# Patient Record
Sex: Male | Born: 1960 | Race: White | Hispanic: No | Marital: Single | Smoking: Never smoker
Health system: Southern US, Community
[De-identification: ages and names within clinical notes are randomized; demographics above are authoritative.]

## PROBLEM LIST (undated history)

## (undated) DIAGNOSIS — K469 Unspecified abdominal hernia without obstruction or gangrene: Secondary | ICD-10-CM

## (undated) DIAGNOSIS — N183 Chronic kidney disease, stage 3 unspecified: Secondary | ICD-10-CM

## (undated) DIAGNOSIS — R599 Enlarged lymph nodes, unspecified: Secondary | ICD-10-CM

## (undated) DIAGNOSIS — E291 Testicular hypofunction: Secondary | ICD-10-CM

## (undated) DIAGNOSIS — N200 Calculus of kidney: Secondary | ICD-10-CM

## (undated) DIAGNOSIS — D649 Anemia, unspecified: Secondary | ICD-10-CM

## (undated) DIAGNOSIS — I10 Essential (primary) hypertension: Secondary | ICD-10-CM

## (undated) DIAGNOSIS — B20 Human immunodeficiency virus [HIV] disease: Secondary | ICD-10-CM

## (undated) DIAGNOSIS — K8020 Calculus of gallbladder without cholecystitis without obstruction: Secondary | ICD-10-CM

## (undated) DIAGNOSIS — R5081 Fever presenting with conditions classified elsewhere: Principal | ICD-10-CM

## (undated) DIAGNOSIS — R011 Cardiac murmur, unspecified: Secondary | ICD-10-CM

## (undated) DIAGNOSIS — D61818 Other pancytopenia: Principal | ICD-10-CM

## (undated) HISTORY — DX: Calculus of gallbladder without cholecystitis without obstruction: K80.20

## (undated) HISTORY — DX: Fever presenting with conditions classified elsewhere: R50.81

## (undated) HISTORY — PX: AXILLARY LYMPH NODE DISSECTION: SHX5229

## (undated) HISTORY — DX: Other pancytopenia: D61.818

## (undated) HISTORY — DX: Enlarged lymph nodes, unspecified: R59.9

## (undated) HISTORY — DX: Human immunodeficiency virus (HIV) disease: B20

## (undated) HISTORY — DX: Testicular hypofunction: E29.1

---

## 1998-05-21 ENCOUNTER — Emergency Department (HOSPITAL_COMMUNITY): Admission: EM | Admit: 1998-05-21 | Discharge: 1998-05-21 | Payer: Self-pay

## 1998-05-23 ENCOUNTER — Ambulatory Visit (HOSPITAL_COMMUNITY): Admission: RE | Admit: 1998-05-23 | Discharge: 1998-05-23 | Payer: Self-pay | Admitting: Urology

## 1998-05-23 ENCOUNTER — Encounter: Payer: Self-pay | Admitting: Urology

## 1999-09-29 HISTORY — PX: APPENDECTOMY: SHX54

## 1999-10-25 ENCOUNTER — Emergency Department (HOSPITAL_COMMUNITY): Admission: EM | Admit: 1999-10-25 | Discharge: 1999-10-25 | Payer: Self-pay | Admitting: Emergency Medicine

## 1999-10-27 ENCOUNTER — Emergency Department (HOSPITAL_COMMUNITY): Admission: EM | Admit: 1999-10-27 | Discharge: 1999-10-27 | Payer: Self-pay | Admitting: Emergency Medicine

## 1999-10-27 ENCOUNTER — Encounter: Payer: Self-pay | Admitting: Emergency Medicine

## 1999-10-28 ENCOUNTER — Inpatient Hospital Stay (HOSPITAL_COMMUNITY): Admission: AD | Admit: 1999-10-28 | Discharge: 1999-11-08 | Payer: Self-pay | Admitting: *Deleted

## 1999-10-28 ENCOUNTER — Encounter (INDEPENDENT_AMBULATORY_CARE_PROVIDER_SITE_OTHER): Payer: Self-pay | Admitting: Specialist

## 2007-01-31 ENCOUNTER — Emergency Department (HOSPITAL_COMMUNITY): Admission: EM | Admit: 2007-01-31 | Discharge: 2007-02-01 | Payer: Self-pay | Admitting: Emergency Medicine

## 2007-03-08 ENCOUNTER — Emergency Department (HOSPITAL_COMMUNITY): Admission: EM | Admit: 2007-03-08 | Discharge: 2007-03-08 | Payer: Self-pay | Admitting: Emergency Medicine

## 2007-07-26 ENCOUNTER — Emergency Department (HOSPITAL_COMMUNITY): Admission: EM | Admit: 2007-07-26 | Discharge: 2007-07-26 | Payer: Self-pay | Admitting: Emergency Medicine

## 2007-07-28 ENCOUNTER — Emergency Department (HOSPITAL_COMMUNITY): Admission: EM | Admit: 2007-07-28 | Discharge: 2007-07-28 | Payer: Self-pay | Admitting: Family Medicine

## 2007-07-31 ENCOUNTER — Emergency Department (HOSPITAL_COMMUNITY): Admission: EM | Admit: 2007-07-31 | Discharge: 2007-07-31 | Payer: Self-pay | Admitting: Family Medicine

## 2007-08-11 ENCOUNTER — Emergency Department (HOSPITAL_COMMUNITY): Admission: EM | Admit: 2007-08-11 | Discharge: 2007-08-11 | Payer: Self-pay | Admitting: Emergency Medicine

## 2007-09-28 ENCOUNTER — Emergency Department (HOSPITAL_COMMUNITY): Admission: EM | Admit: 2007-09-28 | Discharge: 2007-09-28 | Payer: Self-pay | Admitting: Family Medicine

## 2007-12-02 ENCOUNTER — Emergency Department (HOSPITAL_COMMUNITY): Admission: EM | Admit: 2007-12-02 | Discharge: 2007-12-02 | Payer: Self-pay | Admitting: Family Medicine

## 2008-02-23 ENCOUNTER — Emergency Department (HOSPITAL_COMMUNITY): Admission: EM | Admit: 2008-02-23 | Discharge: 2008-02-23 | Payer: Self-pay | Admitting: Family Medicine

## 2008-06-21 ENCOUNTER — Emergency Department (HOSPITAL_COMMUNITY): Admission: EM | Admit: 2008-06-21 | Discharge: 2008-06-21 | Payer: Self-pay | Admitting: Emergency Medicine

## 2008-08-04 ENCOUNTER — Emergency Department (HOSPITAL_COMMUNITY): Admission: EM | Admit: 2008-08-04 | Discharge: 2008-08-04 | Payer: Self-pay | Admitting: Emergency Medicine

## 2008-09-28 DIAGNOSIS — B20 Human immunodeficiency virus [HIV] disease: Secondary | ICD-10-CM

## 2008-09-28 DIAGNOSIS — Z21 Asymptomatic human immunodeficiency virus [HIV] infection status: Secondary | ICD-10-CM

## 2008-09-28 HISTORY — DX: Human immunodeficiency virus (HIV) disease: B20

## 2008-09-28 HISTORY — DX: Asymptomatic human immunodeficiency virus (hiv) infection status: Z21

## 2008-09-28 HISTORY — PX: LITHOTRIPSY: SUR834

## 2008-12-03 ENCOUNTER — Inpatient Hospital Stay (HOSPITAL_COMMUNITY): Admission: EM | Admit: 2008-12-03 | Discharge: 2008-12-17 | Payer: Self-pay | Admitting: Emergency Medicine

## 2008-12-03 ENCOUNTER — Ambulatory Visit: Payer: Self-pay | Admitting: Internal Medicine

## 2008-12-03 ENCOUNTER — Encounter: Payer: Self-pay | Admitting: Internal Medicine

## 2008-12-05 ENCOUNTER — Encounter: Payer: Self-pay | Admitting: Internal Medicine

## 2008-12-05 LAB — CONVERTED CEMR LAB: Hep B C IgM: NEGATIVE

## 2008-12-06 ENCOUNTER — Ambulatory Visit: Payer: Self-pay | Admitting: Internal Medicine

## 2008-12-12 ENCOUNTER — Encounter (INDEPENDENT_AMBULATORY_CARE_PROVIDER_SITE_OTHER): Payer: Self-pay | Admitting: *Deleted

## 2008-12-24 ENCOUNTER — Encounter: Payer: Self-pay | Admitting: Internal Medicine

## 2009-01-01 ENCOUNTER — Ambulatory Visit: Payer: Self-pay | Admitting: Internal Medicine

## 2009-01-01 DIAGNOSIS — M109 Gout, unspecified: Secondary | ICD-10-CM

## 2009-01-01 DIAGNOSIS — N200 Calculus of kidney: Secondary | ICD-10-CM

## 2009-01-01 DIAGNOSIS — N12 Tubulo-interstitial nephritis, not specified as acute or chronic: Secondary | ICD-10-CM | POA: Insufficient documentation

## 2009-01-01 DIAGNOSIS — I10 Essential (primary) hypertension: Secondary | ICD-10-CM

## 2009-01-01 DIAGNOSIS — N2581 Secondary hyperparathyroidism of renal origin: Secondary | ICD-10-CM | POA: Insufficient documentation

## 2009-01-01 DIAGNOSIS — B20 Human immunodeficiency virus [HIV] disease: Secondary | ICD-10-CM

## 2009-01-01 LAB — CONVERTED CEMR LAB: HCV Quantitative: NEGATIVE intl units/mL

## 2009-01-02 ENCOUNTER — Encounter: Payer: Self-pay | Admitting: Internal Medicine

## 2009-01-02 LAB — CONVERTED CEMR LAB
AST: 17 units/L (ref 0–37)
Albumin: 4.5 g/dL (ref 3.5–5.2)
BUN: 17 mg/dL (ref 6–23)
CO2: 24 meq/L (ref 19–32)
Calcium: 9.7 mg/dL (ref 8.4–10.5)
Chloride: 103 meq/L (ref 96–112)
Creatinine, Ser: 1.5 mg/dL (ref 0.40–1.50)
HCT: 30 % — ABNORMAL LOW (ref 39.0–52.0)
Hemoglobin: 9.5 g/dL — ABNORMAL LOW (ref 13.0–17.0)
Hep B S Ab: POSITIVE — AB
Potassium: 4.4 meq/L (ref 3.5–5.3)
RBC: 3.19 M/uL — ABNORMAL LOW (ref 4.22–5.81)
RDW: 15.9 % — ABNORMAL HIGH (ref 11.5–15.5)
WBC: 3.2 10*3/uL — ABNORMAL LOW (ref 4.0–10.5)

## 2009-01-07 ENCOUNTER — Encounter: Payer: Self-pay | Admitting: Internal Medicine

## 2009-01-08 ENCOUNTER — Ambulatory Visit: Payer: Self-pay | Admitting: Internal Medicine

## 2009-01-14 ENCOUNTER — Encounter (INDEPENDENT_AMBULATORY_CARE_PROVIDER_SITE_OTHER): Payer: Self-pay | Admitting: *Deleted

## 2009-01-17 ENCOUNTER — Telehealth: Payer: Self-pay | Admitting: Internal Medicine

## 2009-01-18 ENCOUNTER — Inpatient Hospital Stay (HOSPITAL_COMMUNITY): Admission: EM | Admit: 2009-01-18 | Discharge: 2009-01-21 | Payer: Self-pay | Admitting: Family Medicine

## 2009-01-19 ENCOUNTER — Ambulatory Visit: Payer: Self-pay | Admitting: Infectious Diseases

## 2009-01-23 ENCOUNTER — Telehealth: Payer: Self-pay | Admitting: Internal Medicine

## 2009-01-29 ENCOUNTER — Telehealth (INDEPENDENT_AMBULATORY_CARE_PROVIDER_SITE_OTHER): Payer: Self-pay | Admitting: *Deleted

## 2009-02-04 ENCOUNTER — Encounter: Payer: Self-pay | Admitting: Internal Medicine

## 2009-02-05 ENCOUNTER — Ambulatory Visit: Payer: Self-pay | Admitting: Internal Medicine

## 2009-02-11 ENCOUNTER — Ambulatory Visit (HOSPITAL_COMMUNITY): Admission: RE | Admit: 2009-02-11 | Discharge: 2009-02-11 | Payer: Self-pay | Admitting: Orthopedic Surgery

## 2009-02-11 ENCOUNTER — Telehealth (INDEPENDENT_AMBULATORY_CARE_PROVIDER_SITE_OTHER): Payer: Self-pay | Admitting: *Deleted

## 2009-02-14 ENCOUNTER — Telehealth (INDEPENDENT_AMBULATORY_CARE_PROVIDER_SITE_OTHER): Payer: Self-pay | Admitting: *Deleted

## 2009-02-19 ENCOUNTER — Encounter: Payer: Self-pay | Admitting: Internal Medicine

## 2009-02-21 ENCOUNTER — Ambulatory Visit: Payer: Self-pay | Admitting: Internal Medicine

## 2009-02-21 LAB — CONVERTED CEMR LAB
ALT: 19 units/L (ref 0–53)
Alkaline Phosphatase: 68 units/L (ref 39–117)
CO2: 22 meq/L (ref 19–32)
Cholesterol: 248 mg/dL — ABNORMAL HIGH (ref 0–200)
Creatinine, Ser: 1.26 mg/dL (ref 0.40–1.50)
Eosinophils Relative: 2 % (ref 0–5)
GFR calc Af Amer: >60 mL/min (ref >60)
GFR calc non Af Amer: >60 mL/min (ref >60)
Glucose, Bld: 116 mg/dL — ABNORMAL HIGH (ref 70–99)
HCT: 37.5 % — ABNORMAL LOW (ref 39.0–52.0)
HDL: 38 mg/dL — ABNORMAL LOW (ref >39)
Hemoglobin: 12.5 g/dL — ABNORMAL LOW (ref 13.0–17.0)
LDL Cholesterol: 164 mg/dL — ABNORMAL HIGH (ref 0–99)
Lymphocytes Relative: 34 % (ref 12–46)
Lymphs Abs: 1.3 10*3/uL (ref 0.7–4.0)
Monocytes Absolute: 0.3 10*3/uL (ref 0.1–1.0)
Monocytes Relative: 8 % (ref 3–12)
Neutro Abs: 2.1 10*3/uL (ref 1.7–7.7)
RBC: 3.81 M/uL — ABNORMAL LOW (ref 4.22–5.81)
Sodium: 137 meq/L (ref 135–145)
Total Bilirubin: 0.4 mg/dL (ref 0.3–1.2)
Total CHOL/HDL Ratio: 6.5
Total Protein: 7.6 g/dL (ref 6.0–8.3)
Triglycerides: 228 mg/dL — ABNORMAL HIGH (ref <150)
VLDL: 46 mg/dL — ABNORMAL HIGH (ref 0–40)

## 2009-03-26 ENCOUNTER — Ambulatory Visit: Payer: Self-pay | Admitting: Internal Medicine

## 2009-04-10 ENCOUNTER — Encounter: Payer: Self-pay | Admitting: Internal Medicine

## 2009-05-20 ENCOUNTER — Encounter: Payer: Self-pay | Admitting: Internal Medicine

## 2009-06-11 IMAGING — CT CT ABDOMEN W/O CM
2 of 4 series · 17 of 46 positions shown, 19 images · non-contrast
Comparison: CT urogram 12/03/2008

CT ABDOMEN

CLINICAL DATA: Left flank pain, hematuria, fever

CT OF THE ABDOMEN AND PELVIS WITHOUT CONTRAST (CT UROGRAM)
TECHNIQUE: Multidetector CT imaging was performed through the
abdomen and pelvis to include the urinary tract.

[Series 2: start above diaphragm · axial · 0.88mm/px · z∈[-426,-11]mm · 14 of 94 slices shown, 16 images]
[im 7/94  soft-tissue]
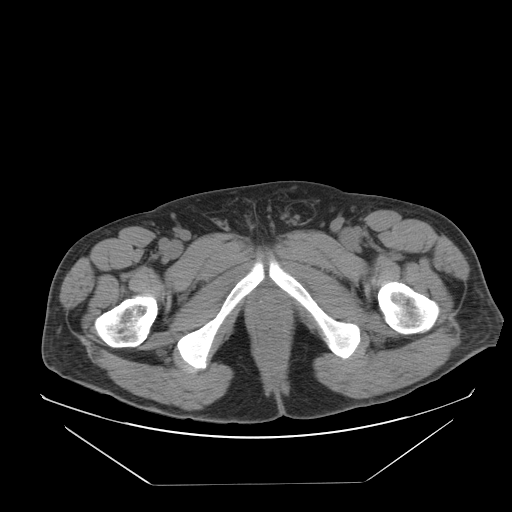
[im 7/94  bone]
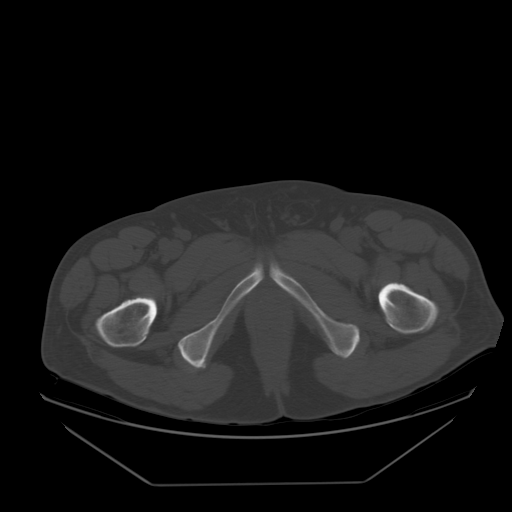
[im 13/94  soft-tissue]
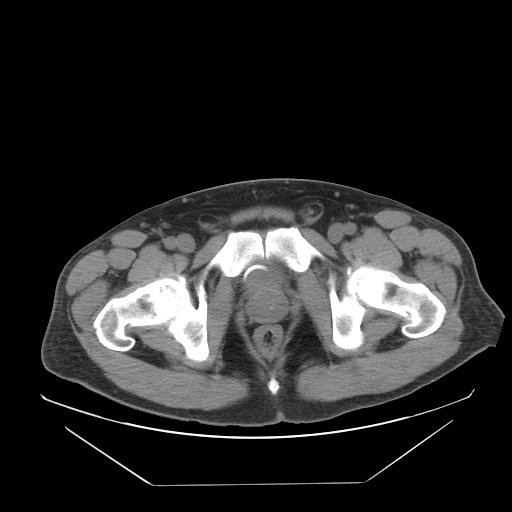
[im 20/94  soft-tissue]
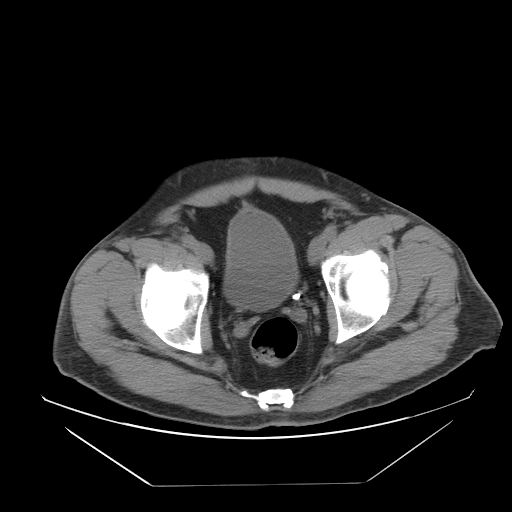
[im 26/94  soft-tissue]
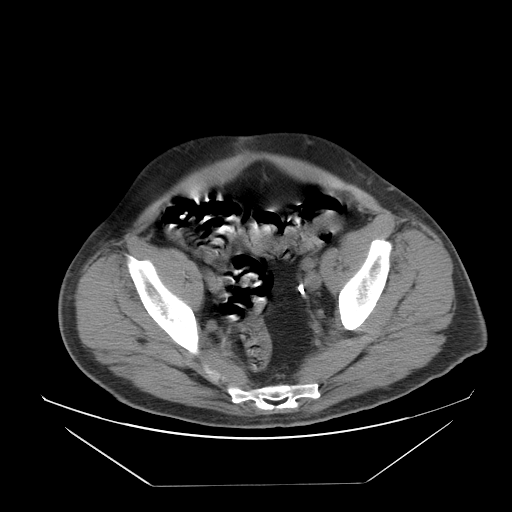
[im 33/94  soft-tissue]
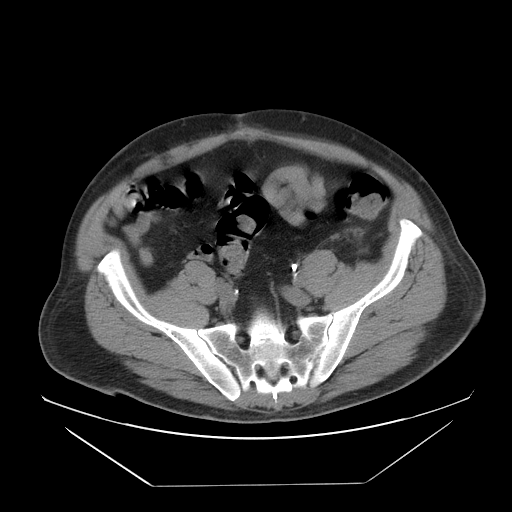
[im 39/94  soft-tissue]
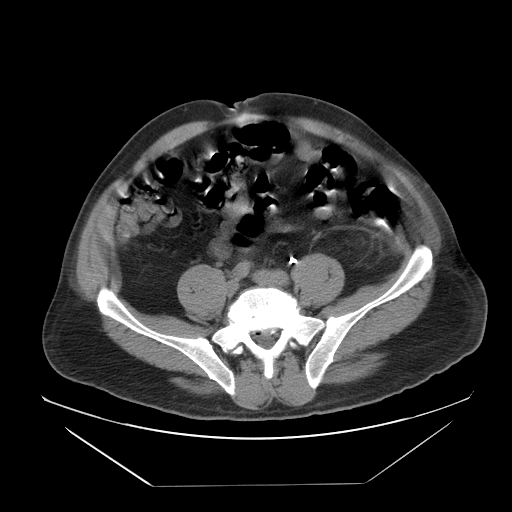
[im 45/94  soft-tissue]
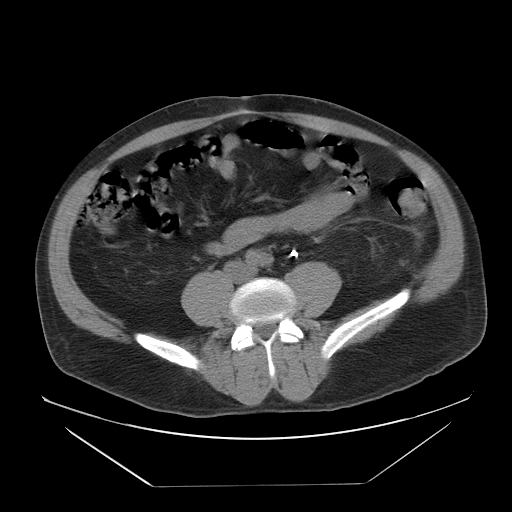
[im 52/94  soft-tissue]
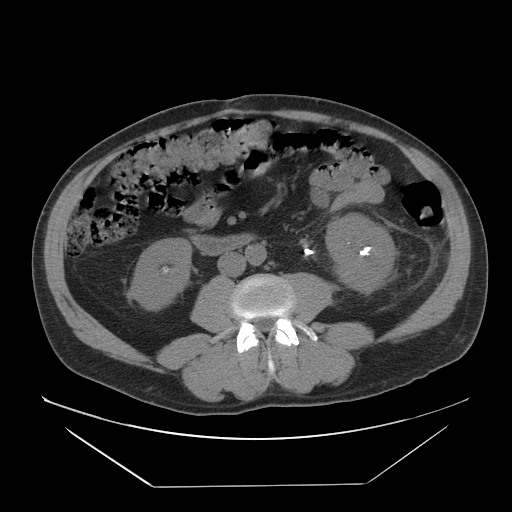
[im 58/94  soft-tissue]
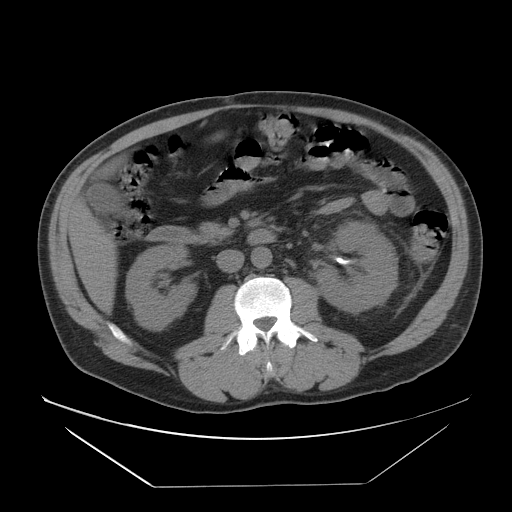
[im 58/94  bone]
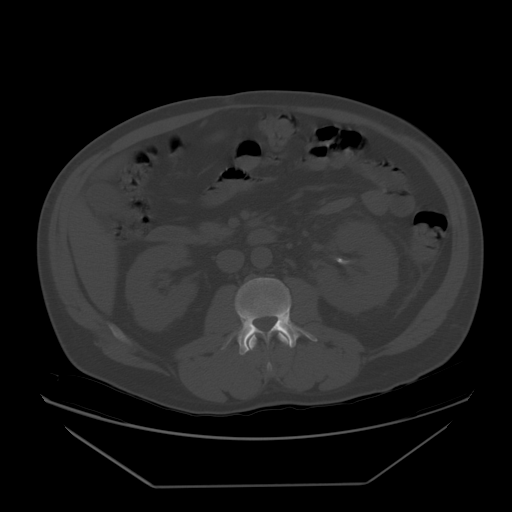
[im 65/94  soft-tissue]
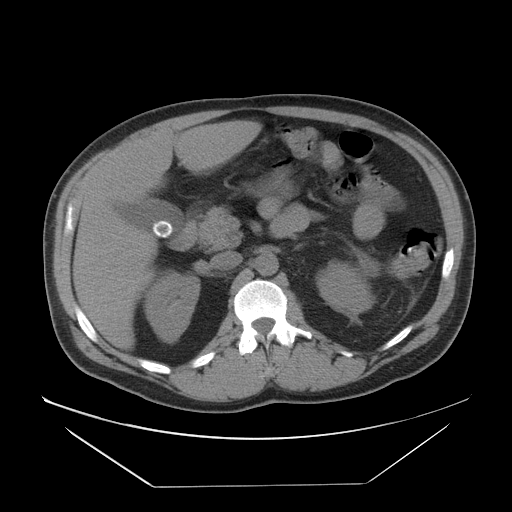
[im 71/94  soft-tissue]
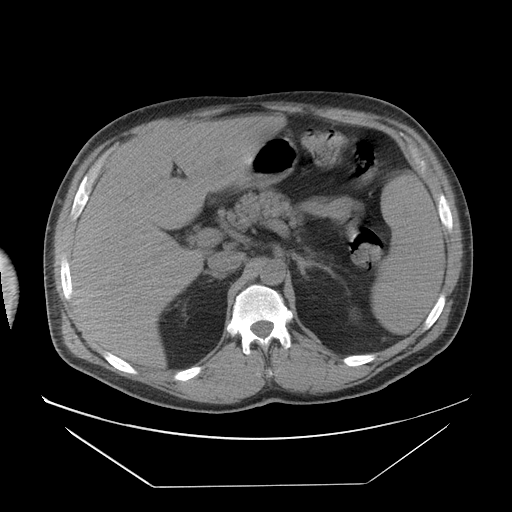
[im 77/94  soft-tissue]
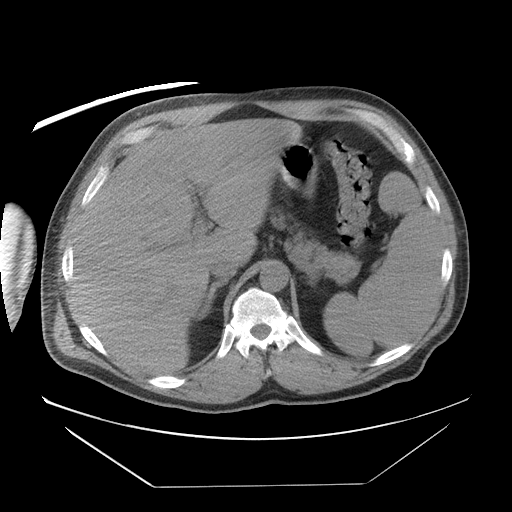
[im 84/94  soft-tissue]
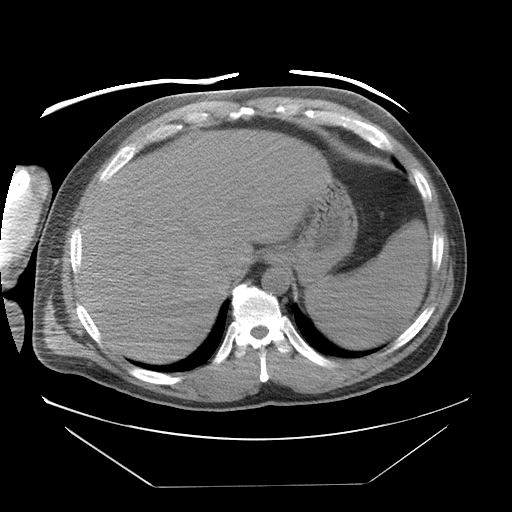
[im 90/94  soft-tissue]
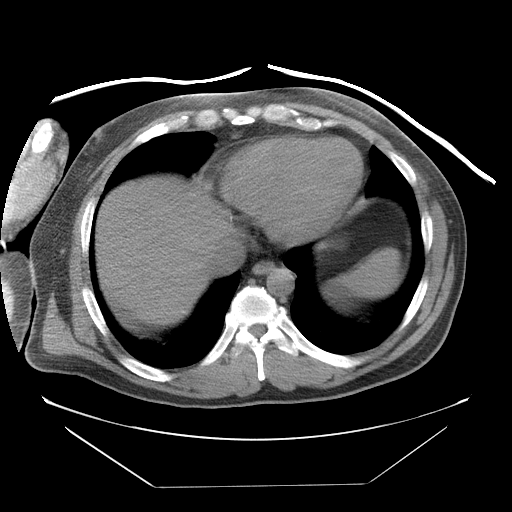

[Series 400: reformatted · coronal · 0.90mm/px · 3 of 83 slices shown]
[im 28/83  soft-tissue]
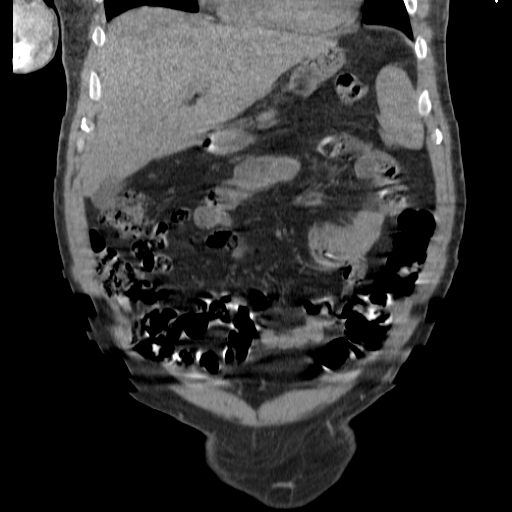
[im 37/83  soft-tissue]
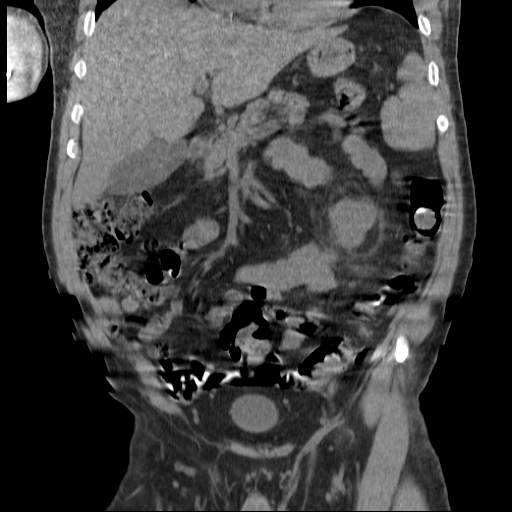
[im 46/83  soft-tissue]
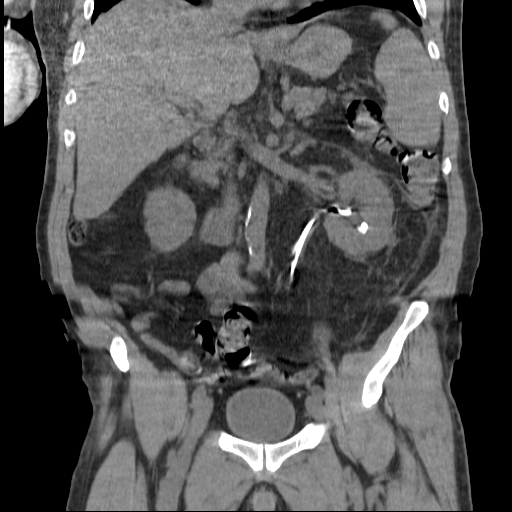

[17 of 46 positions shown; findings below may reference images not displayed]

FINDINGS: Lung bases clear.  Unenhanced appearance of the liver
and spleen unremarkable.  Cholelithiasis without signs of acute
cholecystitis.  Pancreas unremarkable.

Right nephrolithiasis without obstructive uropathy.  Left
nephrolithiasis with indwelling double-J catheter.  Mild left
greater than right perinephric stranding without focal fluid
collection.  Mild thickening Gerota's fascia.  Atherosclerotic
changes of the aorta, nonaneurysmal.  Moderate stool burden.  No
free intra-abdominal fluid or free air.

Compared with prior CT the large calculi in the left kidney have
been removed via percutaneous intervention.
IMPRESSION: As above.  Left double-J catheter in good position.  Mild left
greater than right perinephric stranding.  Nephrocalcinosis.
Cholelithiasis.

CT PELVIS
FINDINGS: Distal aspect double-J catheter satisfactory position in
the bladder.  No free fluid or free air.  No obstructive uropathy.
Moderate stool burden.  Mild stranding of the perinephric fat
inferiorly without drainable fluid collection.  No acute osseous
findings.  Advanced disc space narrowing L5-S1. Mild hip joint
changes.  Left inguinal hernia.
IMPRESSION: No significant obstructive uropathy or drainable fluid collection.
Double-J catheter in good position on the left.

## 2009-06-18 ENCOUNTER — Encounter (INDEPENDENT_AMBULATORY_CARE_PROVIDER_SITE_OTHER): Payer: Self-pay | Admitting: *Deleted

## 2009-06-25 ENCOUNTER — Ambulatory Visit: Payer: Self-pay | Admitting: Internal Medicine

## 2009-06-25 LAB — CONVERTED CEMR LAB
BUN: 17 mg/dL (ref 6–23)
CO2: 25 meq/L (ref 19–32)
Calcium: 9.4 mg/dL (ref 8.4–10.5)
Creatinine, Ser: 1.32 mg/dL (ref 0.40–1.50)
Glucose, Bld: 124 mg/dL — ABNORMAL HIGH (ref 70–99)
Sodium: 138 meq/L (ref 135–145)

## 2009-07-03 ENCOUNTER — Telehealth (INDEPENDENT_AMBULATORY_CARE_PROVIDER_SITE_OTHER): Payer: Self-pay | Admitting: *Deleted

## 2009-07-16 ENCOUNTER — Ambulatory Visit: Payer: Self-pay | Admitting: Internal Medicine

## 2009-08-01 ENCOUNTER — Telehealth (INDEPENDENT_AMBULATORY_CARE_PROVIDER_SITE_OTHER): Payer: Self-pay | Admitting: *Deleted

## 2009-08-27 ENCOUNTER — Telehealth (INDEPENDENT_AMBULATORY_CARE_PROVIDER_SITE_OTHER): Payer: Self-pay | Admitting: *Deleted

## 2009-10-02 ENCOUNTER — Telehealth (INDEPENDENT_AMBULATORY_CARE_PROVIDER_SITE_OTHER): Payer: Self-pay | Admitting: *Deleted

## 2009-10-11 ENCOUNTER — Encounter (INDEPENDENT_AMBULATORY_CARE_PROVIDER_SITE_OTHER): Payer: Self-pay | Admitting: *Deleted

## 2009-10-17 ENCOUNTER — Ambulatory Visit: Payer: Self-pay | Admitting: Internal Medicine

## 2009-10-18 ENCOUNTER — Encounter: Payer: Self-pay | Admitting: Internal Medicine

## 2009-10-18 LAB — CONVERTED CEMR LAB
AST: 15 units/L (ref 0–37)
BUN: 20 mg/dL (ref 6–23)
Basophils Relative: 0 % (ref 0–1)
CO2: 23 meq/L (ref 19–32)
Calcium: 9.1 mg/dL (ref 8.4–10.5)
Chloride: 103 meq/L (ref 96–112)
Cholesterol: 214 mg/dL — ABNORMAL HIGH (ref 0–200)
Creatinine, Ser: 1.4 mg/dL (ref 0.40–1.50)
Eosinophils Absolute: 0.1 10*3/uL (ref 0.0–0.7)
Eosinophils Relative: 3 % (ref 0–5)
HCT: 41.6 % (ref 39.0–52.0)
HDL: 27 mg/dL — ABNORMAL LOW (ref >39)
Lymphs Abs: 1 10*3/uL (ref 0.7–4.0)
MCHC: 31.5 g/dL (ref 30.0–36.0)
MCV: 99.3 fL (ref >=78.0)
Platelets: 176 10*3/uL (ref 150–400)
Total CHOL/HDL Ratio: 7.9
WBC: 4.5 10*3/uL (ref 4.0–10.5)

## 2009-10-29 ENCOUNTER — Ambulatory Visit: Payer: Self-pay | Admitting: Internal Medicine

## 2009-10-29 ENCOUNTER — Telehealth (INDEPENDENT_AMBULATORY_CARE_PROVIDER_SITE_OTHER): Payer: Self-pay | Admitting: *Deleted

## 2009-10-29 DIAGNOSIS — R7309 Other abnormal glucose: Secondary | ICD-10-CM

## 2009-10-30 ENCOUNTER — Encounter (INDEPENDENT_AMBULATORY_CARE_PROVIDER_SITE_OTHER): Payer: Self-pay | Admitting: *Deleted

## 2009-11-28 ENCOUNTER — Ambulatory Visit (HOSPITAL_COMMUNITY): Admission: RE | Admit: 2009-11-28 | Discharge: 2009-11-28 | Payer: Self-pay | Admitting: Infectious Disease

## 2009-11-28 ENCOUNTER — Ambulatory Visit: Payer: Self-pay | Admitting: Infectious Disease

## 2009-11-28 LAB — CONVERTED CEMR LAB
ALT: 21 units/L (ref 0–53)
BUN: 15 mg/dL (ref 6–23)
CO2: 24 meq/L (ref 19–32)
Calcium: 9.3 mg/dL (ref 8.4–10.5)
Chloride: 102 meq/L (ref 96–112)
Creatinine, Ser: 1.36 mg/dL (ref 0.40–1.50)
Eosinophils Absolute: 0.2 10*3/uL (ref 0.0–0.7)
Eosinophils Relative: 2 % (ref 0–5)
HCT: 38.7 % — ABNORMAL LOW (ref 39.0–52.0)
HIV 1 RNA Quant: 72 copies/mL — ABNORMAL HIGH (ref <48)
HIV-1 RNA Quant, Log: 1.86 — ABNORMAL HIGH (ref <1.68)
Lymphs Abs: 1.3 10*3/uL (ref 0.7–4.0)
MCV: 97.7 fL (ref >=78.0)
Monocytes Relative: 7 % (ref 3–12)
RBC: 3.96 M/uL — ABNORMAL LOW (ref 4.22–5.81)
WBC: 6.2 10*3/uL (ref 4.0–10.5)

## 2009-12-04 ENCOUNTER — Telehealth: Payer: Self-pay | Admitting: Internal Medicine

## 2009-12-10 ENCOUNTER — Telehealth: Payer: Self-pay | Admitting: Infectious Disease

## 2009-12-19 ENCOUNTER — Telehealth: Payer: Self-pay | Admitting: Internal Medicine

## 2010-01-16 ENCOUNTER — Encounter (INDEPENDENT_AMBULATORY_CARE_PROVIDER_SITE_OTHER): Payer: Self-pay | Admitting: *Deleted

## 2010-01-28 ENCOUNTER — Ambulatory Visit: Payer: Self-pay | Admitting: Internal Medicine

## 2010-01-28 LAB — CONVERTED CEMR LAB
Chloride: 105 meq/L (ref 96–112)
HIV 1 RNA Quant: <48 copies/mL (ref <48)
HIV-1 RNA Quant, Log: <1.68 (ref <1.68)
Potassium: 4.6 meq/L (ref 3.5–5.3)
Sodium: 137 meq/L (ref 135–145)

## 2010-04-08 ENCOUNTER — Ambulatory Visit: Payer: Self-pay | Admitting: Internal Medicine

## 2010-04-23 ENCOUNTER — Ambulatory Visit: Payer: Self-pay | Admitting: Internal Medicine

## 2010-05-15 ENCOUNTER — Telehealth (INDEPENDENT_AMBULATORY_CARE_PROVIDER_SITE_OTHER): Payer: Self-pay | Admitting: *Deleted

## 2010-05-27 ENCOUNTER — Inpatient Hospital Stay (HOSPITAL_COMMUNITY)
Admission: EM | Admit: 2010-05-27 | Discharge: 2010-05-31 | Payer: Self-pay | Source: Home / Self Care | Admitting: Emergency Medicine

## 2010-05-27 ENCOUNTER — Emergency Department (HOSPITAL_COMMUNITY): Admission: EM | Admit: 2010-05-27 | Discharge: 2010-05-27 | Payer: Self-pay | Admitting: Family Medicine

## 2010-05-28 ENCOUNTER — Ambulatory Visit: Payer: Self-pay | Admitting: Internal Medicine

## 2010-05-28 ENCOUNTER — Encounter (INDEPENDENT_AMBULATORY_CARE_PROVIDER_SITE_OTHER): Payer: Self-pay | Admitting: Internal Medicine

## 2010-05-31 ENCOUNTER — Encounter: Payer: Self-pay | Admitting: Internal Medicine

## 2010-06-12 ENCOUNTER — Ambulatory Visit: Payer: Self-pay | Admitting: Internal Medicine

## 2010-06-12 DIAGNOSIS — L03119 Cellulitis of unspecified part of limb: Secondary | ICD-10-CM

## 2010-06-12 DIAGNOSIS — L02419 Cutaneous abscess of limb, unspecified: Secondary | ICD-10-CM

## 2010-06-12 LAB — CONVERTED CEMR LAB
CO2: 24 meq/L (ref 19–32)
Chloride: 105 meq/L (ref 96–112)
HIV-1 RNA Quant, Log: <1.3 (ref <1.30)
Potassium: 4.8 meq/L (ref 3.5–5.3)
Sodium: 139 meq/L (ref 135–145)

## 2010-06-16 ENCOUNTER — Encounter (INDEPENDENT_AMBULATORY_CARE_PROVIDER_SITE_OTHER): Payer: Self-pay | Admitting: *Deleted

## 2010-07-15 ENCOUNTER — Ambulatory Visit: Payer: Self-pay | Admitting: Adult Health

## 2010-08-25 ENCOUNTER — Encounter (INDEPENDENT_AMBULATORY_CARE_PROVIDER_SITE_OTHER): Payer: Self-pay | Admitting: *Deleted

## 2010-10-19 ENCOUNTER — Encounter: Payer: Self-pay | Admitting: Internal Medicine

## 2010-10-28 NOTE — Miscellaneous (Signed)
   Clinical Lists Changes  Orders: Added new Service order of New Patient Level II [phone]) - Signed

## 2010-10-28 NOTE — Assessment & Plan Note (Signed)
Summary: recheck B/P, 143/89    Vital Signs:  Patient profile:   50 year old male Pulse rate:   75 / minute BP sitting:   143 / 89  (left arm) Cuff size:   large  Vitals Entered By: Jennet Maduro RN (April 23, 2010 9:04 AM) CC: B/P check.  Pt. given card with values written on it.  Also included CD4 and VL values.  Prior Medications: ATRIPLA 600-200-300 MG TABS (EFAVIRENZ-EMTRICITAB-TENOFOVIR) Take 1 tablet by mouth once a day LABETALOL HCL 200 MG TABS (LABETALOL HCL) Take 1 tablet by mouth two times a day DAPSONE 25 MG TABS (DAPSONE) Take 4 tablets by mouth once a day Current Allergies: ! CODEINE ! SULFA ! * LATEX

## 2010-10-28 NOTE — Miscellaneous (Signed)
Summary: clinical update/ryan white  Clinical Lists Changes  Observations: Added new observation of PCTFPL: 157.97  (06/16/2010 12:39) Added new observation of HOUSEINCOME: 16109  (06/16/2010 12:39) Added new observation of YEARLYEXPEN: 16000  (06/16/2010 12:39) Added new observation of FINASSESSDT: 06/13/2010  (06/16/2010 12:39)

## 2010-10-28 NOTE — Progress Notes (Signed)
Summary: [facility]/pt assist meds arrived for Feb  Phone Note Refill Request      Prescriptions: ATRIPLA 600-200-300 MG TABS (EFAVIRENZ-EMTRICITAB-TENOFOVIR) Take 1 tablet by mouth once a day  #30 x 0   Entered by:   Paulo Fruit  BS,CPht II,MPH   Authorized by:   Cliffton Asters MD   Signed by:   Paulo Fruit  BS,CPht II,MPH on 10/29/2009   Method used:   Samples Given   RxID:   [id number] DAPSONE 100 MG TABS (DAPSONE) Take 1 tablet by mouth once a day  #30 x 0   Entered by:   Paulo Fruit  BS,CPht II,MPH   Authorized by:   Cliffton Asters MD   Signed by:   Paulo Fruit  BS,CPht II,MPH on 10/29/2009   Method used:   Samples Given   RxID:   [id number]   Patient Assist Medication Verification: Medication: Atripla Lot# [id number] Exp Date:07 2013 Tech approval:MLD                Patient Assist Medication Verification: Medication:Dapsone 100mg  Lot#[id number] Exp Date:10 2015 Tech approval:MLD  I noticed during today's visit that Dr. Orvan Falconer d/cd the diflucan and zithromax.  Pharmacy notified not to send future refills on those medicatons. Call placed to patient with message that assistance medications are ready for pick-up. Patient said he will be in to pick up on Wednesday. Paulo Fruit  BS,CPht II,MPH  October 29, 2009 3:27 PM

## 2010-10-28 NOTE — Assessment & Plan Note (Signed)
Summary: hfu per Dr. Coralie Common 1wk  call pt. left msg. [mkj]   Primary Provider:  Cliffton Asters MD  CC:  HSFU, left lower leg cellulitis, finished oral clindamycin, and discharged 06/07/10.  History of Present Illness: Patrick [name] is in for a hospital follow-up visit.  He was admitted recently with left lower leg cellulitis that occurred one day after working in the garden.  He was down on his knees quite a bit that day and recalls scrubbing in his legs with a stiff brush while he was in the shower.  The next morning he woke with discomfort in his left leg, redness and swelling.  He got progressively worse over the next 24 hours and was admitted.  He improved fairly rapidly with empiric therapy he was discharged home on clindamycin which he completed 5 days ago.  He is feeling much better.  He has not missed any doses of his other medications.  Preventive Screening-Counseling & Management  Alcohol-Tobacco     Alcohol drinks/day: 1     Alcohol type: all     Smoking Status: never  Caffeine-Diet-Exercise     Caffeine use/day: yes     Does Patient Exercise: yes     Type of exercise: bicycling     Exercise (avg: min/session): 30-60     Times/week: 3  Hep-HIV-STD-Contraception     HIV Risk: no risk noted     HIV Risk Counseling: not indicated-no HIV risk noted  Safety-Violence-Falls     Seat Belt Use: yes      Sexual History:  n/a.        Drug Use:  never.     Prior Medication List:  ATRIPLA 600-200-300 MG TABS (EFAVIRENZ-EMTRICITAB-TENOFOVIR) Take 1 tablet by mouth once a day LABETALOL HCL 200 MG TABS (LABETALOL HCL) Take 1 tablet by mouth two times a day DAPSONE 100 MG TABS (DAPSONE) Take 1 tablet by mouth once a day   Current Allergies (reviewed today): ! CODEINE ! SULFA ! * LATEX Social History: Sexual History:  n/a Drug Use:  never  Vital Signs:  Patient profile:   50 year old male Height:      72 inches (182.88 cm) Weight:      238.0 pounds (108.18 kg) BMI:      32.40 Temp:     99.1 degrees F (37.28 degrees C) oral Pulse rate:   91 / minute BP sitting:   149 / 84  (left arm) Cuff size:   large  Vitals Entered By: Jennet Maduro RN (June 12, 2010 3:51 PM) CC: HSFU, left lower leg cellulitis, finished oral clindamycin, discharged 06/07/10 Is Patient Diabetic? No Pain Assessment Patient in pain? no      Nutritional Status BMI of 25 - 29 = overweight Nutritional Status Detail appetite "improving"  Have you ever been in a relationship where you felt threatened, hurt or afraid?No   Does patient need assistance? Functional Status Self care Ambulation Normal Comments no missed doses of hiv rxes   Physical Exam  General:  alert and overweight-appearing.   Mouth:  slgiht posterior erytehma no exudate Lungs:  normal respiratory effort, no crackles, and no wheezes.   Heart:  normal rate, regular rhythm, no murmur, no gallop, and no rub.   Extremities:  he has mild nonpitting edema of both lower legs left greater than right.  There is faint residual erythema on his left lower leg without associated warmth or pain.  The left leg has some mild dryness and superficial cracking.  He has no evidence of tinea pedis.        Medication Adherence: 06/12/2010   Adherence to medications reviewed with patient. Counseling to provide adequate adherence provided                                Impression & Recommendations:  Problem # 1:  HIV DISEASE (ICD-042) I will continue his current regimen and repeat his lab work today. Diagnostics Reviewed:  HIV: CDC-defined AIDS (01/01/2009)   CD4: 140 (01/29/2010)   WBC: 6.2 (11/28/2009)   Hgb: 12.5 (11/28/2009)   HCT: 38.7 (11/28/2009)   Platelets: 206 (11/28/2009) HIV-1 RNA: <48 copies/mL (01/28/2010)   HBSAg: NEG (01/01/2009)  Problem # 2:  CELLULITIS AND ABSCESS OF LEG EXCEPT FOOT (ICD-682.6) Hhis left leg cellulitis is resolving nicely.  I will observe off of antibiotics. Orders: Est. Patient  Level IV (66063)  Problem # 3:  HYPERTENSION (ICD-401.9) Alexandre's blood pressure is under fair control but he may need a second agent soon.  I will check a basic metabolic panel today. His updated medication list for this problem includes:    Labetalol Hcl 200 Mg Tabs (Labetalol hcl) .Marland Kitchen... Take 1 tablet by mouth two times a day  Orders: Est. Patient Level IV (99214)  BP today: 149/84 Prior BP: 143/89 (04/23/2010)  Labs Reviewed: K+: 4.6 (01/28/2010) Creat: : 1.41 (01/28/2010)   Chol: 214 (10/18/2009)   HDL: 27 (10/18/2009)   LDL: * mg/dL (01/60/1093)   TG: 235 (10/18/2009)  Other Orders: T-CD4SP ([facility]) (CD4SP) T-HIV Viral Load (57322-02542) T-Basic Metabolic Panel (70623-76283)  Patient Instructions: 1)  Please schedule a follow-up appointment in 3 months.

## 2010-10-28 NOTE — Miscellaneous (Signed)
Summary: clinical update/ryan white [facility] apprv til 12/27/10  Clinical Lists Changes  Observations: Added new observation of AIDSDAP: Yes 2011 (01/16/2010 14:41) 

## 2010-10-28 NOTE — Progress Notes (Signed)
Summary: [facility]/pt assist meds arrived for Jan  Phone Note Refill Request      Prescriptions: AZITHROMYCIN 600 MG TABS (AZITHROMYCIN) Take 2 tablets by mouth once a weej  #8 x 0   Entered by:   Paulo Fruit  BS,CPht II,MPH   Authorized by:   Cliffton Asters MD   Signed by:   Paulo Fruit  BS,CPht II,MPH on 10/02/2009   Method used:   Samples Given   RxID:   [id number] DAPSONE 100 MG TABS (DAPSONE) Take 1 tablet by mouth once a day  #30 x 0   Entered by:   Paulo Fruit  BS,CPht II,MPH   Authorized by:   Cliffton Asters MD   Signed by:   Paulo Fruit  BS,CPht II,MPH on 10/02/2009   Method used:   Samples Given   RxID:   [id number] FLUCONAZOLE 100 MG TABS (FLUCONAZOLE) Take 1 tablet by mouth once weekly  #4 x 0   Entered by:   Paulo Fruit  BS,CPht II,MPH   Authorized by:   Cliffton Asters MD   Signed by:   Paulo Fruit  BS,CPht II,MPH on 10/02/2009   Method used:   Samples Given   RxID:   [id number] ATRIPLA 600-200-300 MG TABS (EFAVIRENZ-EMTRICITAB-TENOFOVIR) Take 1 tablet by mouth once a day  #30 x 0   Entered by:   Paulo Fruit  BS,CPht II,MPH   Authorized by:   Cliffton Asters MD   Signed by:   Paulo Fruit  BS,CPht II,MPH on 10/02/2009   Method used:   Samples Given   RxID:   [id number]  Patient Assist Medication Verification: Medication name: Fluconazole 100mg  RX # [id number] Tech approval:MLD  Patient Assist Medication Verification: Medication name: Azithromycin 600mg  RX # [id number] Tech approval:MLD   Patient Assist Medication Verification: Medication: Tyrone Nine IRJ#[id number] Exp Date:06 2013 Tech approval:MLD                Patient Assist Medication Verification: Medication:Dapsone 100mg  Lot#[id number] Exp Date:10 2015 Tech approval:MLD Call placed to patient with message that assistance medications are ready for pick-up. Left message Paulo Fruit  BS,CPht II,MPH  October 02, 2009 10:42 AM                   Appended Document: [facility]/pt assist  meds arrived for Jan Prescription/Samples picked up by: patient

## 2010-10-28 NOTE — Discharge Summary (Signed)
Summary: Hospital Discharge Update    Hospital Discharge Update:  Date of Admission: 05/28/2010 Date of Discharge: 05/31/2010  Brief Summary:  Cellulitis of left lower extremity, improved on IV antibiotics  Other follow-up issues:  Follow-up resolution of left lower extremity cellulitis.  Medication list changes:  Added new medication of CLEOCIN 300 MG CAPS (CLINDAMYCIN HCL) Take one capsule three times daily - Signed Rx of CLEOCIN 300 MG CAPS (CLINDAMYCIN HCL) Take one capsule three times daily;  #21 x 0;  Signed;  Entered by: Bethel Born MD;  Authorized by: Bethel Born MD;  Method used: Electronically to [id number]*, [address] [place], [place], [place]  [address], Ph: [phone], Fax: [phone]  The medication, problem, and allergy lists have been updated.  Please see the dictated discharge summary for details.  Discharge medications:  ATRIPLA 600-200-300 MG TABS (EFAVIRENZ-EMTRICITAB-TENOFOVIR) Take 1 tablet by mouth once a day LABETALOL HCL 200 MG TABS (LABETALOL HCL) Take 1 tablet by mouth two times a day DAPSONE 100 MG TABS (DAPSONE) Take 1 tablet by mouth once a day CLEOCIN 300 MG CAPS (CLINDAMYCIN HCL) Take one capsule three times daily  Other patient instructions:  The clinic will call you with an appointment in 1 week. Please call [phone] in case you don't hear from [place]. Return to ED if your infection seems to be getting worse (fever, worsening pain, increasing redness).  Note: Hospital Discharge Medications & Other Instructions handout was printed, one copy for patient and a second copy to be placed in hospital chart.  Prescriptions: CLEOCIN 300 MG CAPS (CLINDAMYCIN HCL) Take one capsule three times daily  #21 x 0   Entered and Authorized by:   Bethel Born MD   Signed by:   Bethel Born MD on 05/31/2010   Method used:   Electronically to        [facility]. [phone]* (retail)       [address]       [place], [place]  [address]       Ph: [phone]       Fax: [phone]   RxID:   [id number]

## 2010-10-28 NOTE — Miscellaneous (Signed)
Summary: hosptial admission: cellulitis  INTERNAL MEDICINE ADMISSION HISTORY AND PHYSICAL Admit date 05/28/10  Attending: Dr. Meredith Pel Contacts: Dr. Allena Katz [phone]       Dr. Scot Dock [phone]         PCP: Dr. Orvan Falconer  CC: left leg rash  HPI:  Patrick [name] with pmh of HIV presents with 2 days of painful redness and swelling of the left lower leg.  He reports that sympoms started Monday afternoon (day prior to presentation).  He felt an achyness in his leg while he was at work, and by the time he was able to examine the leg later that afternoon there was redness extending over the entire calf.  The area was warm, swollen and painful.  By the time he got up on Tuesday morning he felt "like he had been beaten with a baseball bat" over the left leg, and could not put weight on that leg.  He describes pain being 10/10, deep in the calf and also in the knee.  He also endorses "flu-like" feelings since Monday with fatigue, chills and subjective fever and nausea.  He does not remember any trauma to the leg.  The day before symptoms started he worked in his garden for many hours and was kneeling in grass and mulch.  He does not recall any insect bites but notes that his dog has recently had a tick.   He presented to urgent care Tuesday morning and was refered to the emergency room.  In the ED he was placed on the cellulitis protocol, recieved 3 doses of IV clindamycin and observed overnight.  He now reports that pain and redness have improved, but the rash has extended 4 cm beyond the margins drawn last night.   ALLERGIES:  ! CODEINE--> hallucinations ! SULFA --> rash urticaria ! * LATEX  --> he does not recall being allergic to latex  PAST MEDICAL HISTORY: HIV disease - dx 11/2008 - CD4 140 01/2010 hx MSSA bacteremia in 11/2008 and 12/2008 hx MRSA skin infection Hypertension Pyelonephritis recurrent  Nephrolithiasis - sp ureteral stent 11/2008 which was removed due to recurrent infections hx renal failure  11/2008 secondary to ureteral obstruction and NSAID use.  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 no chest pain, shortness of breath, vomiting or diarrhea, other skin lesions, dysuria, numbness, weakness, or neurologic symptoms  VITALS: T: 101.7--> 99.4 P: 101--> 84  BP: 148/83  R:16  O2SAT: 94%  ON: RA  PHYSICAL EXAM: GEN: overweight male, comfortable, no distress HEENT: PERRL, EOMI, conjunctiva clear, MMM, no LAD, no JVD CV: RRR no mrg LUNGS: CTAB with good air movment ABD: BS +, soft, nt, nd, no mass, no gaurding, large midline scar, small left lower quadrant scar SKIN: erythema and tenderness over the left anterior calf from the ankle over the knee extends medially and laterally but is not circumferential, no skin breaks identified, reddest area is in over the lower shin, entire area is warm, calf  is swollen and visibly larger than the right, erythema extends 2-5 cm beyond margins drawn yesterday.  There are no skin breaks on the foot or between the toes. MSK: strength 5/5, there is no pain with motion of the left knee, there may be a small effusion- or this might be soft tissue inflamation overlying NEURO: non focal   LABS:   WBC                                    12.6       h      4.0-10.5         K/uL  RBC                                      3.96       l      4.22-5.81        MIL/uL  Hemoglobin (HGB)                          12.4       l      13.0-17.0        g/dL  Hematocrit (HCT)                         37.0       l      39.0-52.0        %  MCV                                      93.4              78.0-100.0       fL  MCH -                                    31.3              26.0-34.0        pg  MCHC                                     33.5              30.0-36.0        g/dL  RDW                                      13.5              11.5-15.5        %  Platelet Count (PLT)                     146        l      150-400          K/uL  Neutrophils, %  75                43-77            %  Lymphocytes, %                           18                12-46            %  Monocytes, %                             8                 3-12             %  Eosinophils, %                           0                 0-5              %  Basophils, %                             0                 0-1              %  Neutrophils, Absolute                    9.4        h      1.7-7.7          K/uL  Lymphocytes, Absolute                    2.2               0.7-4.0          K/uL  Monocytes, Absolute                      0.9               0.1-1.0          K/uL  Eosinophils, Absolute                    0.0               0.0-0.7          K/uL  Basophils, Absolute                      0.0               0.0-0.1          K/uL  Sodium (NA)                              137               135-145          mEq/L  Potassium (K)  4.3               3.5-5.1          mEq/L  Chloride                                 108               96-112           mEq/L  CO2                                      25                19-32            mEq/L  Glucose                                  203        h      70-99            mg/dL  BUN                                      9                 6-23             mg/dL  Creatinine                               1.48              0.4-1.5          mg/dL  GFR,  Est Non African American            51         l      >60              mL/min  GFR, Est African American                >60               >60              mL/min    Oversized comment, see footnote  1  Calcium                                  8.8               8.4-10.5         mg/dL   ED MEDS:  Clindamycin 300mg  q8hrs x 3 Tylenol home meds   ASSESSMENT AND PLAN: (1) left lower extremity cellulitis:  Has not responded to clindamycin.  Will draw blood cultures and start vacomycin and zosyn.  He has a hx of MRSA so will start contact precautions.  (2) HIV: last CD4 was several months ago.  Will recheck to evaluate immune status.  Continue home regimen.  (3) HTN: continue labetalol  (4) hyperglycemia: check A1C and FLP  (5) CRI: renal function slightly  below former baseline.  will follow. he sees Dr. Hyman Hopes in September.  (6) DVT prophy: heparin

## 2010-10-28 NOTE — Progress Notes (Signed)
Summary: drug on back order/ had to change drug to Dapsone 25mg   Phone Note From Pharmacy   Caller: [facility] Summary of Call: Dapsone 100mg  is temporarily on back order. Request to change to Dapsone 25mg  take 4 tablets by mouth once daily. Initial call taken by: Paulo Fruit  BS,CPht II,MPH,  December 19, 2009 3:33 PM    New/Updated Medications: DAPSONE 25 MG TABS (DAPSONE) Take 4 tablets by mouth once a day Prescriptions: DAPSONE 25 MG TABS (DAPSONE) Take 4 tablets by mouth once a day  #120 x 5   Entered by:   Paulo Fruit  BS,CPht II,MPH   Authorized by:   Cliffton Asters MD   Signed by:   Paulo Fruit  BS,CPht II,MPH on 12/19/2009   Method used:   Telephoned to ...       [facility] ([facility]       [address]       [place], [place]  [address]       Ph: [phone]       Fax: [phone]   RxID:   [id number]  Paulo Fruit  BS,CPht II,MPH  December 19, 2009 3:36 PM

## 2010-10-28 NOTE — Progress Notes (Signed)
Summary: [facility] cannot reach pt.  Phone Note Outgoing Call   Call placed by: Paulo Fruit  BS,CPht II,MPH,  May 15, 2010 3:59 PM Call placed to: Patient Details for Reason: pharmacy cannot reach to set up refill delivery Summary of Call: Left message for patient to contact our office. Initial call taken by: Paulo Fruit  BS,CPht II,MPH,  May 15, 2010 4:01 PM Caller: [facility] support [facility] Details for Reason: cannot reach pt to set up refill Summary of Call: Received a phone call from [facility] in reference to not being able to reach pt to set up his refill for the month.  They were given the phone number we have on file for patient.  I will try and contact patient for him to call the [phone] phone number to set up his refill and delivery.  They will not send medication until he is reached. Initial call taken by: Paulo Fruit  BS,CPht II,MPH,  May 15, 2010 3:59 PM

## 2010-10-28 NOTE — Progress Notes (Signed)
Summary: Sinus pressure/congestion cont., requesting more abx  Phone Note Call from Patient Call back at [facility] Phone [phone]   Caller: Patient Call For: Cliffton Asters MD Reason for Call: Acute Illness Summary of Call: Sinus congestion/pressure continues.  Requesting another rx for antibiotics.  No appt. available until next week.  Please advise.  Jennet Maduro RN  December 10, 2009 5:00 PM     Follow-up for Phone Call        He can take avelox for one month. If he is still having problems he will need to be seen by ENT. Follow-up by: Acey Lav MD,  December 11, 2009 5:27 PM  Additional Follow-up for Phone Call Additional follow up Details #1::        Message left for pt. with Dr. Lattie Haw rx and a request for the pt. to call when he finishes the rx to let us know how he is feeling. Jennet Maduro RN  December 12, 2009 10:15 AM     Additional Follow-up for Phone Call Additional follow up Details #2::    [facility]  Follow-up by: Acey Lav MD,  December 12, 2009 12:40 PM  New/Updated Medications: AVELOX 400 MG TABS (MOXIFLOXACIN HCL) Take 1 tablet by mouth once a day for 30 days Prescriptions: AVELOX 400 MG TABS (MOXIFLOXACIN HCL) Take 1 tablet by mouth once a day for 30 days  #30 x 0   Entered by:   Jennet Maduro RN   Authorized by:   Acey Lav MD   Signed by:   Jennet Maduro RN on 12/12/2009   Method used:   Electronically to        [facility]. [phone]* (retail)       [address]       [place], [place]  [address]       Ph: [phone]       Fax: [phone]   RxID:   [id number]   Appended Document: Sinus pressure/congestion cont., requesting more abx Pt. went to pick up the Avelox rx.  Would cost him approx. $560.00.  Pt. is requesting a less expensive rx.  Please advise.   Appended Document: Orders Update orders   Clinical Lists Changes  Medications: Removed medication of AVELOX 400 MG TABS  (MOXIFLOXACIN HCL) Take 1 tablet by mouth once a day for 30 days Added new medication of CLINDAMYCIN HCL 300 MG CAPS (CLINDAMYCIN HCL) Take 1 capsule by mouth three times a day for 10 days - Signed Rx of CLINDAMYCIN HCL 300 MG CAPS (CLINDAMYCIN HCL) Take 1 capsule by mouth three times a day for 10 days;  #30 x 1;  Signed;  Entered by: Acey Lav MD;  Authorized by: Paulette Blanch Dam MD;  Method used: Electronically to [facility]. #[id number]*, [address] [place], [place], [place]  [address], Ph: [phone], Fax: [phone]  IF HE FAILED AUGMENTIN, AND CANT AFFORD AVELOX WE CAN TRY CLINDAMYCIN, SHOULD BE CHEAPER THAN AVELOX. NO POINT IN GIVING HIM AMOXICILLIN SINCE HE FAILED AMOXICILLIN/CLAVULUNIC ACID. OR DID HE GET BETTER ON AUGMENTIN AND THEN GET WORSE OFF OF IT?  Prescriptions: CLINDAMYCIN HCL 300 MG CAPS (CLINDAMYCIN HCL) Take 1 capsule by mouth three times a day for 10 days  #30 x 1   Entered and Authorized by:   Acey Lav MD   Signed by:   Jennet Maduro RN on 12/16/2009   Method used:  Electronically to        [facility]. [phone]* (retail)       [address]       [place], [place]  [address]       Ph: [phone]       Fax: [phone]   RxID:   [id number]

## 2010-10-28 NOTE — Assessment & Plan Note (Signed)
Summary: resfrom PM   CC:  follow-up visit.  History of Present Illness: Patrick [name] is in for his routine visit.  He is not changed any of his medications.  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  His misses came sporadically through the month when he was working very late, getting home at 3 or 4 in the morning and he felt it was too late to take as a triplet because it would make him too groggy the next day. He has not missed any doses since early December.  Preventive Screening-Counseling & Management  Alcohol-Tobacco     Alcohol drinks/day: 1     Alcohol type: all     Smoking Status: never  Caffeine-Diet-Exercise     Caffeine use/day: yes     Does Patient Exercise: yes     Type of exercise: bicycling     Exercise (avg: min/session): 30-60     Times/week: 3  Hep-HIV-STD-Contraception     HIV Risk: no risk noted     HIV Risk Counseling: not indicated-no HIV risk noted  Safety-Violence-Falls     Seat Belt Use: yes  Comments: DECLINED CONDOMS      Sexual History:  n/a.        Drug Use:  never.     Prior Medication List:  ATRIPLA 600-200-300 MG TABS (EFAVIRENZ-EMTRICITAB-TENOFOVIR) Take 1 tablet by mouth once a day FLUCONAZOLE 100 MG TABS (FLUCONAZOLE) Take 1 tablet by mouth once weekly DAPSONE 100 MG TABS (DAPSONE) Take 1 tablet by mouth once a day AZITHROMYCIN 600 MG TABS (AZITHROMYCIN) Take 2 tablets by mouth once a weej LABETALOL HCL 200 MG TABS (LABETALOL HCL) Take 1 tablet by mouth two times a day   Current Allergies (reviewed today): ! CODEINE ! SULFA ! * LATEX Social History: Sexual History:  n/a  Vital Signs:  Patient profile:   50 year old male Height:      72 inches (182.88 cm) Weight:      239.8 pounds (109 kg) BMI:     32.64 Temp:     97.8 degrees F (36.56 degrees C) oral Pulse rate:   77 / minute BP sitting:   139 / 83  (left arm) Cuff size:   large  Vitals  Entered By: Jennet Maduro RN (October 29, 2009 8:59 AM) CC: follow-up visit Is Patient Diabetic? No Pain Assessment Patient in pain? no      Nutritional Status BMI of > 30 = obese Nutritional Status Detail appetite "good"  Have you ever been in a relationship where you felt threatened, hurt or afraid?No   Does patient need assistance? Functional Status Self care Ambulation Normal Comments missed 7 doses during the month of Nov.2010   Physical Exam  General:  alert and overweight-appearing.  his weight is up 35 pounds in the past year. Mouth:  pharynx pink and moist, no erythema, and no exudates.   Lungs:  normal breath sounds, no crackles, and no wheezes.   Heart:  normal rate, regular rhythm, and no murmur.   Abdomen:  no CVA tenderness.soft, non-tender, normal bowel sounds, and no masses.   Skin:  no rashes.   Psych:  normally interactive, good eye contact, not anxious appearing, and not depressed appearing.      Impression & Recommendations:  Problem # 1:  HIV DISEASE (ICD-042) Patrick [name]'s HIV infection has come under much better control over the past year.  I will have him stop his Diflucan and Zithromax. The following medications were removed from the medication list:    Fluconazole 100 Mg Tabs (Fluconazole) .Marland Kitchen... Take 1 tablet by mouth once weekly    Azithromycin 600 Mg Tabs (Azithromycin) .Marland Kitchen... Take 2 tablets by mouth once a weej  Diagnostics Reviewed:  HIV: CDC-defined AIDS (01/01/2009)   CD4: 100 (10/18/2009)   WBC: 4.5 (10/18/2009)   Hgb: 13.1 (10/18/2009)   HCT: 41.6 (10/18/2009)   Platelets: 176 (10/18/2009) HIV-1 RNA: <48 copies/mL (10/18/2009)   HBSAg: NEG (01/01/2009)  Problem # 2:  OBESITY (ICD-278.00) I have had a long discussion with him about his weight gain and metabolic syndrome.  He is motivated to get back to the gym and regular exercise and he is considering going home on and to review the Weight Watchers program.  I told him that if we cannot make  gains and weight loss, he is likely to need further medication for blood pressure control, diabetes and dyslipidemia.  Problem # 3:  HYPERGLYCEMIA (ICD-790.29) see above. Labs Reviewed: Creat: 1.40 (10/18/2009)     Problem # 4:  DYSLIPIDEMIA (ICD-272.4)  see above.  Labs Reviewed: SGOT: 15 (10/18/2009)   SGPT: 15 (10/18/2009)   HDL:27 (10/18/2009), 38 (02/21/2009)  LDL:* mg/dL (16/06/9603), 540 (98/07/9146)  Chol:214 (10/18/2009), 248 (02/21/2009)  Trig:414 (10/18/2009), 228 (02/21/2009)  Problem # 5:  HYPERTENSION (ICD-401.9) his blood pressure is under better control. His updated medication list for this problem includes:    Labetalol Hcl 200 Mg Tabs (Labetalol hcl) .Marland Kitchen... Take 1 tablet by mouth two times a day  BP today: 139/83 Prior BP: 165/93 (07/16/2009)  Labs Reviewed: K+: 4.6 (10/18/2009) Creat: : 1.40 (10/18/2009)   Chol: 214 (10/18/2009)   HDL: 27 (10/18/2009)   LDL: * mg/dL (82/95/6213)   TG: 086 (10/18/2009)  Other Orders: Est. Patient Level IV (57846) Future Orders: T-CD4SP ([facility]) (CD4SP) ... 01/27/2010 T-HIV Viral Load [phone]) ... 01/27/2010 T-Basic Metabolic Panel [phone]) ... 01/27/2010  Process Orders Check Orders Results:     [facility]: ABN not required for this insurance Tests Sent for requisitioning (October 29, 2009 9:30 AM):     01/27/2010: [facility] -- T-HIV Viral Load [id number] (signed)     01/27/2010: [facility] -- T-Basic Metabolic Panel [phone] (signed)

## 2010-10-28 NOTE — Miscellaneous (Signed)
Summary: Patrick [name] Form Update  Clinical Lists Changes  Observations: Added new observation of GENDER: Male (10/30/2009 11:54)

## 2010-10-28 NOTE — Miscellaneous (Signed)
Summary: RW Update  Clinical Lists Changes  Observations: Added new observation of HIV RISK BEH: MSM (10/11/2009 14:47)

## 2010-10-28 NOTE — Assessment & Plan Note (Signed)
Summary: ?URI/kdw   Visit Type:  Follow-up Primary Provider:  Cliffton Asters MD  CC:  sinus congestion and productive cough X 2 months with fever on and offf 2.  fever blister lips.  History of Present Illness: 50 yo man with hix of HIV, AIDs with superb control and perfect virological suppresion, CD4 now above 100 no longer requiring azithromycin MAI prophylaxis or fluconazole for thrush. He states that he had "horrible URI" in January was taking mx otc meds. Was improving. Now developed worsening cough. Had fever blister. Has had low grade fever and presents to clniic for evaluation. he also has had some fullness in his left ear. He also has noticed easy bruising and asked if the atripla could have caussed this.  Problems Prior to Update: 1)  Obesity  (ICD-278.00) 2)  Hyperglycemia  (ICD-790.29) 3)  Dyslipidemia  (ICD-272.4) 4)  Need Prophylactic Vaccination&inoculation Flu  (ICD-V04.81) 5)  Adverse Drug Reaction, Sulfa  (ICD-995.27) 6)  Gout, Unspecified  (ICD-274.9) 7)  Inguinal Herniorrhaphy, Left, Hx of  (ICD-V45.89) 8)  Appendicitis, Hx of  (ICD-V12.79) 9)  Nephrolithiasis  (ICD-592.0) 10)  Personal Hx of Methicillin Resist Staph Aureus  (ICD-V12.04) 11)  Cholelithiasis, Hx of  (ICD-V12.79) 12)  Secondary Hyperparathyroidism  (ICD-588.81) 13)  Pyelonephritis  (ICD-590.80) 14)  Bacteremia, Mssa  (ICD-790.7) 15)  Fitting and Adjustment of Urinary Device  (ICD-V53.6) 16)  Hypertension  (ICD-401.9) 17)  Renal Failure  (ICD-586) 18)  HIV Disease  (ICD-042)  Medications Prior to Update: 1)  Atripla 600-200-300 Mg Tabs (Efavirenz-Emtricitab-Tenofovir) .... Take 1 Tablet By Mouth Once A Day 2)  Dapsone 100 Mg Tabs (Dapsone) .... Take 1 Tablet By Mouth Once A Day 3)  Labetalol Hcl 200 Mg Tabs (Labetalol Hcl) .... Take 1 Tablet By Mouth Two Times A Day  Current Medications (verified): 1)  Atripla 600-200-300 Mg Tabs (Efavirenz-Emtricitab-Tenofovir) .... Take 1 Tablet By Mouth Once A  Day 2)  Dapsone 100 Mg Tabs (Dapsone) .... Take 1 Tablet By Mouth Once A Day 3)  Labetalol Hcl 200 Mg Tabs (Labetalol Hcl) .... Take 1 Tablet By Mouth Two Times A Day 4)  Amoxicillin 500 Mg Caps (Amoxicillin) .... Take 1 Capsule By Mouth Three Times A Day For 10days  Allergies: 1)  ! Codeine 2)  ! Sulfa 3)  ! * Latex   Preventive Screening-Counseling & Management  Alcohol-Tobacco     Alcohol drinks/day: 1     Alcohol type: all     Smoking Status: never   Current Allergies: ! CODEINE ! SULFA ! * LATEX Vital Signs:  Patient profile:   50 year old male Height:      72 inches Weight:      241.3 pounds BMI:     32.84 Temp:     97.9 degrees F Pulse rate:   74 / minute BP sitting:   157 / 80  (right arm)  Vitals Entered By: Wendall Mola CMA Duncan Dull) (November 28, 2009 3:32 PM) CC: sinus congestion, productive cough X 2 months with fever on and offf 2.  fever blister lips Is Patient Diabetic? No Pain Assessment Patient in pain? no      Nutritional Status BMI of > 30 = obese Nutritional Status Detail normal  Have you ever been in a relationship where you felt threatened, hurt or afraid?No  Domestic Violence Intervention none  Does patient need assistance? Functional Status Self care Ambulation Normal        Medication Adherence: 11/28/2009  Adherence to medications reviewed with patient. Counseling to provide adequate adherence provided   Prevention For Positives: 11/28/2009   Safe sex practices discussed with patient. Condoms offered.   Education Materials Provided: 11/28/2009 Safe sex practices discussed with patient. Condoms offered.                          Physical Exam  General:  alert, well-developed, well-nourished, and well-hydrated.   Head:  normocephalic, atraumatic, and no abnormalities observed.   Eyes:  vision grossly intact, pupils equal, and pupils round.   Ears:  no external deformities.  wax in R ear TM clear, L ear with minimal  erythema Nose:  no external deformity and no external erythema.  slight erythem of turbinates Mouth:  slgiht posterior erytehma no exudate Neck:  supple and full ROM.   Lungs:  normal respiratory effort, no crackles, and no wheezes.   Heart:  normal rate, regular rhythm, no murmur, no gallop, and no rub.   Abdomen:  soft, non-tender, and normal bowel sounds.   Msk:  normal ROM and no joint tenderness.   Extremities:  No clubbing, cyanosis, edema, or deformity noted with normal full range of motion of all joints.   Neurologic:  alert & oriented X3, strength normal in all extremities, and gait normal.   Skin:  no rashes and no ecchymoses.   Psych:  Oriented X3, memory intact for recent and remote, and normally interactive.     Impression & Recommendations:  Problem # 1:  COUGH (ICD-786.2) Assessment New  Could be sinus infection given, though no sinus tenderness, Given duration of ssx will give him amoxicilin which will also cover his ear. CXR showed no infiltrates His updated medication list for this problem includes:    Amoxicillin 500 Mg Caps (Amoxicillin) .Marland Kitchen... Take 1 capsule by mouth three times a day for 10days  Orders: T- * Misc. Laboratory test [phone]) CXR- 2view (CXR) Est. Patient Level IV (53664)  Diagnostics Reviewed:   Problem # 2:  ACUTE SUPPURATIVE OTITIS MEDIA DZ CLASS ELSW (ICD-382.02) Assessment: New  Giving amoxicillin His updated medication list for this problem includes:    Amoxicillin 500 Mg Caps (Amoxicillin) .Marland Kitchen... Take 1 capsule by mouth three times a day for 10days  Orders: Est. Patient Level IV (40347)  Problem # 3:  HIV DISEASE (ICD-042) Recheck VL and cd4, superb control. Dont think hsi perceived easy bruising is due to his HIV His updated medication list for this problem includes:    Amoxicillin 500 Mg Caps (Amoxicillin) .Marland Kitchen... Take 1 capsule by mouth three times a day for 10days  Orders: T-Comprehensive Metabolic Panel [id number]) T-CD4  ([id number]) T-CBC w/Diff ([id number]) T-HIV Viral Load ([id number]) CXR- 2view (CXR) Est. Patient Level IV (32202)  Diagnostics Reviewed:  HIV: CDC-defined AIDS (01/01/2009)   CD4: 100 (10/18/2009)   WBC: 4.5 (10/18/2009)   Hgb: 13.1 (10/18/2009)   HCT: 41.6 (10/18/2009)   Platelets: 176 (10/18/2009) HIV-1 RNA: <48 copies/mL (10/18/2009)   HBSAg: NEG (01/01/2009)  Medications Added to Medication List This Visit: 1)  Amoxicillin 500 Mg Caps (Amoxicillin) .... Take 1 capsule by mouth three times a day for 10days  Patient Instructions: 1)  keep fu appt with Dr. Cathie Olden 2)  Call if not gettting better with antibiotics   Prescriptions: AMOXICILLIN 500 MG CAPS (AMOXICILLIN) Take 1 capsule by mouth three times a day for 10days  #30 x 0   Entered and Authorized by:   Acey Lav MD  Signed by:   Paulette Blanch Dam MD on 11/28/2009   Method used:   Print then Give to Patient   RxID:   [id number]  Process Orders Check Orders Results:     [facility]: ABN not required for this insurance Tests Sent for requisitioning (November 29, 2009 2:00 PM):     11/28/2009: [facility] -- T-Comprehensive Metabolic Panel [[id number]] (signed)     11/28/2009: [facility] -- T-CD4 [phone] (signed)     11/28/2009: [facility] -- T-CBC w/Diff [[id number]] (signed)     11/28/2009: [facility] -- T-HIV Viral Load [phone] (signed)     11/28/2009: [facility] -- T- * Misc. Laboratory test [id number] (signed)

## 2010-10-28 NOTE — Assessment & Plan Note (Signed)
Summary: fukam   Primary Provider:  Cliffton Asters MD  CC:  follow-up visit.  History of Present Illness: Patrick [name] is in for his routine follow-up visit.  He is doing well and denies any recent problems.  He says that he has had no trouble obtaining his Atripla  but has had to take dapsone as four 25 mg tablets daily because of back order on the 100 mg tablets. he says that he thinks he has only missed two doses of his Atripla since his last visit.  Both times it occurred when he chose not to take it at night because he was busy working stayed up late enough that he felt he could not take it and be ready for work at 6 a.m. He has been out of his labatelol for the last 3 days.  Preventive Screening-Counseling & Management  Alcohol-Tobacco     Alcohol drinks/day: 1     Alcohol type: all     Smoking Status: never  Caffeine-Diet-Exercise     Caffeine use/day: yes     Does Patient Exercise: yes     Type of exercise: bicycling     Exercise (avg: min/session): 30-60     Times/week: 3  Hep-HIV-STD-Contraception     HIV Risk: no risk noted     HIV Risk Counseling: not indicated-no HIV risk noted  Safety-Violence-Falls     Seat Belt Use: yes  Comments: declined condoms      Sexual History:  currently monogamous.        Drug Use:  former.     Updated Prior Medication List: ATRIPLA 600-200-300 MG TABS (EFAVIRENZ-EMTRICITAB-TENOFOVIR) Take 1 tablet by mouth once a day LABETALOL HCL 200 MG TABS (LABETALOL HCL) Take 1 tablet by mouth two times a day DAPSONE 25 MG TABS (DAPSONE) Take 4 tablets by mouth once a day  he states that he has been out of his labetalol for the past 3 days. Current Allergies (reviewed today): ! CODEINE ! SULFA ! * LATEX Social History: Sexual History:  currently monogamous Drug Use:  former  Vital Signs:  Patient profile:   50 year old male Height:      72 inches (182.88 cm) Weight:      235.5 pounds (107.05 kg) BMI:     32.05 Temp:     98.3 degrees F  (36.83 degrees C) oral Pulse rate:   91 / minute BP sitting:   163 / 94  (left arm)  Vitals Entered By: Jennet Maduro RN (April 08, 2010 9:48 AM) CC: follow-up visit Is Patient Diabetic? No Pain Assessment Patient in pain? no      Nutritional Status BMI of > 30 = obese Nutritional Status Detail appetite "good"  Have you ever been in a relationship where you felt threatened, hurt or afraid?No   Does patient need assistance? Functional Status Self care Ambulation Normal Comments may have missed a couple of doses of rxes   Physical Exam  General:  alert and overweight-appearing, but he has lost 6 pounds Mouth:  slgiht posterior erytehma no exudate Lungs:  normal respiratory effort, no crackles, and no wheezes.   Heart:  normal rate, regular rhythm, no murmur, no gallop, and no rub.   Abdomen:  soft, non-tender, and normal bowel sounds.   Skin:  no rashes.   Axillary Nodes:  no R axillary adenopathy and no L axillary adenopathy.   Psych:  Oriented X3, memory intact for recent and remote, and normally interactive.  Medication Adherence: 04/08/2010   Adherence to medications reviewed with patient. Counseling to provide adequate adherence provided                                Impression & Recommendations:  Problem # 1:  HIV DISEASE (ICD-042) His CD4 count is reconstituting slowly but his viral load is undetectable.  I encouraged him to try not to miss a single dose of his medication.  If he finds that he needs to work late and does not feel like he can take his Atripla I will change him to another regimen.  He does not believe this will be a problem. The following medications were removed from the medication list:    Clindamycin Hcl 300 Mg Caps (Clindamycin hcl) .Marland Kitchen... Take 1 capsule by mouth three times a day for 10 days  Diagnostics Reviewed:  HIV: CDC-defined AIDS (01/01/2009)   CD4: 140 (01/29/2010)   WBC: 6.2 (11/28/2009)   Hgb: 12.5 (11/28/2009)    HCT: 38.7 (11/28/2009)   Platelets: 206 (11/28/2009) HIV-1 RNA: <48 copies/mL (01/28/2010)   HBSAg: NEG (01/01/2009)  Problem # 2:  HYPERTENSION (ICD-401.9) His blood pressure is not well controlled.  He will get his labetalol refill today and return in one week for a blood pressure check. His updated medication list for this problem includes:    Labetalol Hcl 200 Mg Tabs (Labetalol hcl) .Marland Kitchen... Take 1 tablet by mouth two times a day  BP today: 163/94 Prior BP: 157/80 (11/28/2009)  Labs Reviewed: K+: 4.6 (01/28/2010) Creat: : 1.41 (01/28/2010)   Chol: 214 (10/18/2009)   HDL: 27 (10/18/2009)   LDL: * mg/dL (16/06/9603)   TG: 540 (10/18/2009)  Orders: Est. Patient Level IV (98119)  Problem # 3:  OBESITY (ICD-278.00) I have encouraged him to continue to work on lifestyle modification with the goal of getting down below 200 pounds.  This would be likely to have a significant positive impact on his blood pressure, heart disease and risk for metabolic syndrome. Orders: Est. Patient Level IV (14782)  Other Orders: Future Orders: T-CD4SP ([facility]) (CD4SP) ... 07/07/2010 T-HIV Viral Load ([phone]) ... 07/07/2010 T-Basic Metabolic Panel ([phone]) ... 07/07/2010  Patient Instructions: 1)  Please schedule a follow-up appointment in 4 months. 2)  Please schedule a nurse visit for blood pressure check in one week.

## 2010-10-28 NOTE — Progress Notes (Signed)
Summary: Slight improvement of symptoms, nasal S&S worse than pulmonary  Phone Note Call from Patient Call back at Home Phone [phone]   Caller: Patient Reason for Call: Acute Illness Summary of Call: Pt. wondering about chest xray results.  Pt. has 3 more days of antibiotics.  Slightly better now.  Still having nasal congestion and drainage which is greenish and slightly blood tinged.  Slight productive cough with yellowish sputum.  Taking Robitussin/Mucinex DM for cough.  Wondering what he should do.  RN shared that chest xray was normal.  RN advised completion of the antibiotics; continue Robitussin/Mucinex; start saline nasal spray to calm nasal tissues and keep drainage thin; take Tylenol for pain or fever and continue to drink plenty of fluids.  RN asked pt. to call the clinic back on Monday, March 14 to let us know how he was doing.  Pt. verbalized understanding of this advice and stated that he would call back on Monday. Jennet Maduro RN  December 04, 2009 10:57 AM

## 2010-10-28 NOTE — Miscellaneous (Signed)
Summary: Patrick [name] Patrick [name])  Clinical Lists Changes

## 2010-10-28 NOTE — Assessment & Plan Note (Signed)
Summary: work in   Primary Provider:  Cliffton Asters MD  CC:  pt. c/o sore rash left arm, in hospital 9/11 cellulitis left leg, and had been wearing pressure cuff on elbow.  History of Present Illness: Presents with 2-day onset of rash on left forearm.  Recent discharge from hospital following treatment of cellulitis to lower legs and expressing concern of new cellulitis to LFA.  Admits to recently having a "cuff" on the LFA and (L) elbow due to "tennis elbow" on left.  Where redness developed was where cuff was located over the past 2-3 days.  Denies fever, chills, sweats, or wound drainage to area.  Preventive Screening-Counseling & Management  Alcohol-Tobacco     Alcohol drinks/day: 1     Alcohol type: all     Smoking Status: never  Caffeine-Diet-Exercise     Caffeine use/day: yes     Does Patient Exercise: yes     Type of exercise: bicycling     Exercise (avg: min/session): 30-60     Times/week: 3  Hep-HIV-STD-Contraception     HIV Risk: risk noted     HIV Risk Counseling: not indicated-no HIV risk noted  Safety-Violence-Falls     Seat Belt Use: yes      Sexual History:  n/a.        Drug Use:  never.    Comments: pt. declined condoms   Current Allergies (reviewed today): ! CODEINE ! SULFA ! * LATEX Review of Systems General:  Denies chills, fatigue, fever, loss of appetite, malaise, sleep disorder, sweats, weakness, and weight loss. Derm:  Complains of changes in color of skin, itching, and rash; denies changes in nail beds, dryness, excessive perspiration, flushing, insect bite(s), lesion(s), and poor wound healing.  Vital Signs:  Patient profile:   50 year old male Height:      72 inches (182.88 cm) Weight:      234.8 pounds (106.73 kg) BMI:     31.96 Temp:     98.3 degrees F (36.83 degrees C) oral Pulse rate:   94 / minute BP sitting:   148 / 94  (right arm)  Vitals Entered By: Wendall Mola CMA Duncan Dull) (July 15, 2010 11:46 AM) CC: pt. c/o sore  rash left arm, in hospital 9/11 cellulitis left leg, had been wearing pressure cuff on elbow Is Patient Diabetic? No Pain Assessment Patient in pain? yes     Location: left arm Intensity: 2 Type: dull Onset of pain  Constant Nutritional Status BMI of > 30 = obese Nutritional Status Detail appeite "normal"  Does patient need assistance? Functional Status Self care Ambulation Normal Comments no missed doses of meds per pt.   Physical Exam  General:  Well-developed,well-nourished,in no acute distress; alert,appropriate and cooperative throughout examination Head:  Normocephalic and atraumatic without obvious abnormalities. No apparent alopecia or balding. Skin:  Mild patchy papular rash to LFA.  No local heat, rubor, fluctuance, or tension over area.     Impression & Recommendations:  Problem # 1:  CONTACT DERMATITIS (ICD-692.9) Most likely associated with sweat and skin contact with material in arm brace.  Cellulitis development less likely. Keep brace off affected areas. Keep area clean and dry OTC hydrocortisone cream 1% to area 2-3 times daily as needed If fever develops, area swells, develops fluctuance or local heat to contact clinic. If no indications otherwise, to follow-up with Dr. Orvan Falconer at next scheduled clinic visit.

## 2010-11-13 ENCOUNTER — Encounter: Payer: Self-pay | Admitting: Internal Medicine

## 2010-11-13 ENCOUNTER — Telehealth: Payer: Self-pay | Admitting: Internal Medicine

## 2010-11-13 ENCOUNTER — Ambulatory Visit (INDEPENDENT_AMBULATORY_CARE_PROVIDER_SITE_OTHER): Payer: Self-pay | Admitting: Internal Medicine

## 2010-11-13 DIAGNOSIS — Z9189 Other specified personal risk factors, not elsewhere classified: Secondary | ICD-10-CM

## 2010-11-13 DIAGNOSIS — B353 Tinea pedis: Secondary | ICD-10-CM

## 2010-11-13 DIAGNOSIS — L309 Dermatitis, unspecified: Secondary | ICD-10-CM | POA: Insufficient documentation

## 2010-11-13 DIAGNOSIS — I10 Essential (primary) hypertension: Secondary | ICD-10-CM

## 2010-11-13 DIAGNOSIS — B20 Human immunodeficiency virus [HIV] disease: Secondary | ICD-10-CM

## 2010-11-17 ENCOUNTER — Encounter (INDEPENDENT_AMBULATORY_CARE_PROVIDER_SITE_OTHER): Payer: Self-pay | Admitting: *Deleted

## 2010-11-18 ENCOUNTER — Ambulatory Visit: Payer: Self-pay | Admitting: Adult Health

## 2010-11-18 ENCOUNTER — Encounter: Payer: Self-pay | Admitting: Adult Health

## 2010-11-18 ENCOUNTER — Encounter: Payer: Self-pay | Admitting: Internal Medicine

## 2010-11-18 ENCOUNTER — Inpatient Hospital Stay (HOSPITAL_COMMUNITY)
Admission: AD | Admit: 2010-11-18 | Discharge: 2010-12-01 | DRG: 977 | Disposition: A | Discharge status: Home / Self Care | Payer: Medicaid Other | Source: Ambulatory Visit | Attending: Internal Medicine | Admitting: Internal Medicine

## 2010-11-18 ENCOUNTER — Telehealth (INDEPENDENT_AMBULATORY_CARE_PROVIDER_SITE_OTHER): Payer: Self-pay | Admitting: *Deleted

## 2010-11-18 ENCOUNTER — Inpatient Hospital Stay (HOSPITAL_COMMUNITY): Payer: Medicaid Other

## 2010-11-18 DIAGNOSIS — D61818 Other pancytopenia: Secondary | ICD-10-CM | POA: Diagnosis present

## 2010-11-18 DIAGNOSIS — B20 Human immunodeficiency virus [HIV] disease: Principal | ICD-10-CM | POA: Diagnosis present

## 2010-11-18 DIAGNOSIS — R599 Enlarged lymph nodes, unspecified: Secondary | ICD-10-CM | POA: Diagnosis present

## 2010-11-18 DIAGNOSIS — N189 Chronic kidney disease, unspecified: Secondary | ICD-10-CM | POA: Diagnosis present

## 2010-11-18 DIAGNOSIS — R509 Fever, unspecified: Secondary | ICD-10-CM | POA: Diagnosis present

## 2010-11-18 DIAGNOSIS — T50995A Adverse effect of other drugs, medicaments and biological substances, initial encounter: Secondary | ICD-10-CM | POA: Diagnosis present

## 2010-11-18 DIAGNOSIS — N200 Calculus of kidney: Secondary | ICD-10-CM | POA: Diagnosis present

## 2010-11-18 DIAGNOSIS — Z23 Encounter for immunization: Secondary | ICD-10-CM

## 2010-11-18 DIAGNOSIS — N1330 Unspecified hydronephrosis: Secondary | ICD-10-CM | POA: Diagnosis present

## 2010-11-18 DIAGNOSIS — Z8614 Personal history of Methicillin resistant Staphylococcus aureus infection: Secondary | ICD-10-CM

## 2010-11-18 DIAGNOSIS — Z79899 Other long term (current) drug therapy: Secondary | ICD-10-CM

## 2010-11-18 DIAGNOSIS — B369 Superficial mycosis, unspecified: Secondary | ICD-10-CM | POA: Diagnosis present

## 2010-11-18 DIAGNOSIS — E875 Hyperkalemia: Secondary | ICD-10-CM | POA: Diagnosis not present

## 2010-11-18 DIAGNOSIS — I129 Hypertensive chronic kidney disease with stage 1 through stage 4 chronic kidney disease, or unspecified chronic kidney disease: Secondary | ICD-10-CM | POA: Diagnosis present

## 2010-11-18 DIAGNOSIS — N179 Acute kidney failure, unspecified: Secondary | ICD-10-CM | POA: Diagnosis present

## 2010-11-18 HISTORY — DX: Enlarged lymph nodes, unspecified: R59.9

## 2010-11-18 HISTORY — DX: Essential (primary) hypertension: I10

## 2010-11-18 LAB — DIFFERENTIAL
Basophils Relative: 1 % (ref 0–1)
Eosinophils Absolute: 0 10*3/uL (ref 0.0–0.7)
Lymphs Abs: 1 10*3/uL (ref 0.7–4.0)
Monocytes Relative: 30 % — ABNORMAL HIGH (ref 3–12)
Neutro Abs: 1.8 10*3/uL (ref 1.7–7.7)
Neutrophils Relative %: 44 % (ref 43–77)

## 2010-11-18 LAB — COMPREHENSIVE METABOLIC PANEL
ALT: 17 U/L (ref 0–53)
Alkaline Phosphatase: 49 U/L (ref 39–117)
BUN: 29 mg/dL — ABNORMAL HIGH (ref 6–23)
CO2: 23 mEq/L (ref 19–32)
GFR calc non Af Amer: 38 mL/min — ABNORMAL LOW (ref >60)
Glucose, Bld: 171 mg/dL — ABNORMAL HIGH (ref 70–99)
Potassium: 5.1 mEq/L (ref 3.5–5.1)
Sodium: 129 mEq/L — ABNORMAL LOW (ref 135–145)
Total Bilirubin: 0.4 mg/dL (ref 0.3–1.2)

## 2010-11-18 LAB — CBC
MCH: 28.1 pg (ref 26.0–34.0)
MCHC: 33.1 g/dL (ref 30.0–36.0)
Platelets: 107 10*3/uL — ABNORMAL LOW (ref 150–400)
RBC: 2.88 MIL/uL — ABNORMAL LOW (ref 4.22–5.81)

## 2010-11-18 LAB — LIPID PANEL
Cholesterol: 101 mg/dL (ref 0–200)
LDL Cholesterol: UNDETERMINED mg/dL (ref 0–99)

## 2010-11-19 LAB — BASIC METABOLIC PANEL
BUN: 27 mg/dL — ABNORMAL HIGH (ref 6–23)
BUN: 23 mg/dL (ref 6–23)
CO2: 23 mEq/L (ref 19–32)
Chloride: 104 mEq/L (ref 96–112)
Chloride: 105 mEq/L (ref 96–112)
Creatinine, Ser: 1.81 mg/dL — ABNORMAL HIGH (ref 0.4–1.5)
Creatinine, Ser: 1.73 mg/dL — ABNORMAL HIGH (ref 0.4–1.5)
GFR calc Af Amer: 48 mL/min — ABNORMAL LOW (ref >60)
GFR calc non Af Amer: 40 mL/min — ABNORMAL LOW (ref >60)
Glucose, Bld: 210 mg/dL — ABNORMAL HIGH (ref 70–99)
Potassium: 5.3 mEq/L — ABNORMAL HIGH (ref 3.5–5.1)

## 2010-11-19 LAB — URINALYSIS, MICROSCOPIC ONLY
Bilirubin Urine: NEGATIVE
Ketones, ur: 15 mg/dL — AB
Nitrite: NEGATIVE
Protein, ur: 30 mg/dL — AB
Urobilinogen, UA: 0.2 mg/dL (ref 0.0–1.0)

## 2010-11-19 LAB — RAPID URINE DRUG SCREEN, HOSP PERFORMED
Amphetamines: NOT DETECTED
Barbiturates: NOT DETECTED
Tetrahydrocannabinol: NOT DETECTED

## 2010-11-19 LAB — URINE CULTURE
Culture  Setup Time: 201202210132
Culture: NO GROWTH
Special Requests: NEGATIVE

## 2010-11-19 LAB — T-HELPER CELLS (CD4) COUNT (NOT AT ARMC)
CD4 % Helper T Cell: 12 % — ABNORMAL LOW (ref 33–55)
CD4 T Cell Abs: 120 uL — ABNORMAL LOW (ref 400–2700)

## 2010-11-19 LAB — OSMOLALITY: Osmolality: 287 mOsm/kg (ref 275–300)

## 2010-11-19 LAB — MRSA PCR SCREENING: MRSA by PCR: NEGATIVE

## 2010-11-19 LAB — DIFFERENTIAL
Basophils Relative: 0 % (ref 0–1)
Eosinophils Absolute: 0 10*3/uL (ref 0.0–0.7)
Eosinophils Relative: 1 % (ref 0–5)
Lymphs Abs: 0.9 10*3/uL (ref 0.7–4.0)
Monocytes Relative: 26 % — ABNORMAL HIGH (ref 3–12)
Neutrophils Relative %: 48 % (ref 43–77)

## 2010-11-19 LAB — CBC
MCH: 27.5 pg (ref 26.0–34.0)
MCV: 85.5 fL (ref 78.0–100.0)
Platelets: 89 10*3/uL — ABNORMAL LOW (ref 150–400)
RBC: 2.76 MIL/uL — ABNORMAL LOW (ref 4.22–5.81)
RDW: 16.6 % — ABNORMAL HIGH (ref 11.5–15.5)
WBC: 3.5 10*3/uL — ABNORMAL LOW (ref 4.0–10.5)

## 2010-11-19 LAB — CK: Total CK: 15 U/L (ref 7–232)

## 2010-11-19 LAB — SODIUM, URINE, RANDOM: Sodium, Ur: 22 mEq/L

## 2010-11-19 LAB — TOXOPLASMA ANTIBODIES- IGG AND  IGM: Toxoplasma IgG Ratio: 1.4 IU/mL

## 2010-11-19 LAB — CRYPTOCOCCAL ANTIGEN

## 2010-11-19 LAB — STREP PNEUMONIAE URINARY ANTIGEN: Strep Pneumo Urinary Antigen: NEGATIVE

## 2010-11-19 LAB — CREATININE, URINE, RANDOM: Creatinine, Urine: 195.7 mg/dL

## 2010-11-19 LAB — INFLUENZA PANEL BY PCR (TYPE A & B)
H1N1 flu by pcr: NOT DETECTED
Influenza A By PCR: NEGATIVE

## 2010-11-19 LAB — OSMOLALITY, URINE: Osmolality, Ur: 516 mOsm/kg (ref 390–1090)

## 2010-11-19 LAB — CORTISOL-AM, BLOOD: Cortisol - AM: 15.5 ug/dL (ref 4.3–22.4)

## 2010-11-19 LAB — SEDIMENTATION RATE: Sed Rate: 134 mm/hr — ABNORMAL HIGH (ref 0–16)
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
## 2010-11-19 NOTE — Progress Notes (Signed)
  Phone Note Call from Patient   Caller: Patient Summary of Call: c/o fevers x 2 days. has been as high as 103. will see him today at 4. to continue antipyretics Initial call taken by: Golden Circle RN,  November 13, 2010 9:11 AM

## 2010-11-20 ENCOUNTER — Inpatient Hospital Stay (HOSPITAL_COMMUNITY): Payer: Medicaid Other

## 2010-11-20 ENCOUNTER — Encounter (HOSPITAL_COMMUNITY): Payer: Self-pay | Admitting: Radiology

## 2010-11-20 LAB — CBC
HCT: 21.4 % — ABNORMAL LOW (ref 39.0–52.0)
Hemoglobin: 6.9 g/dL — CL (ref 13.0–17.0)
MCH: 27.7 pg (ref 26.0–34.0)
MCV: 85.9 fL (ref 78.0–100.0)
Platelets: 97 10*3/uL — ABNORMAL LOW (ref 150–400)
RBC: 2.49 MIL/uL — ABNORMAL LOW (ref 4.22–5.81)

## 2010-11-20 LAB — BASIC METABOLIC PANEL
BUN: 19 mg/dL (ref 6–23)
CO2: 22 mEq/L (ref 19–32)
Chloride: 109 mEq/L (ref 96–112)
Creatinine, Ser: 1.54 mg/dL — ABNORMAL HIGH (ref 0.4–1.5)
Glucose, Bld: 191 mg/dL — ABNORMAL HIGH (ref 70–99)

## 2010-11-20 LAB — EPSTEIN-BARR VIRUS VCA ANTIBODY PANEL
EBV NA IgG: 2.99 {ISR} — ABNORMAL HIGH
EBV VCA IgG: 4.28 {ISR} — ABNORMAL HIGH
EBV VCA IgM: 0.14 {ISR}

## 2010-11-20 LAB — HEPATITIS PANEL, ACUTE
HCV Ab: NEGATIVE
Hep A IgM: NEGATIVE

## 2010-11-20 MED ORDER — IOHEXOL 300 MG/ML  SOLN
100.0000 mL | Freq: Once | INTRAMUSCULAR | Status: AC | PRN
  Administered 2010-11-20: 100 mL via INTRAVENOUS

## 2010-11-21 DIAGNOSIS — R509 Fever, unspecified: Secondary | ICD-10-CM

## 2010-11-21 LAB — BASIC METABOLIC PANEL
BUN: 18 mg/dL (ref 6–23)
BUN: 20 mg/dL (ref 6–23)
CO2: 21 mEq/L (ref 19–32)
Calcium: 8.5 mg/dL (ref 8.4–10.5)
Chloride: 106 mEq/L (ref 96–112)
Chloride: 106 mEq/L (ref 96–112)
Creatinine, Ser: 2.17 mg/dL — ABNORMAL HIGH (ref 0.4–1.5)
GFR calc Af Amer: 39 mL/min — ABNORMAL LOW (ref >60)
GFR calc non Af Amer: 32 mL/min — ABNORMAL LOW (ref >60)
GFR calc non Af Amer: 23 mL/min — ABNORMAL LOW (ref >60)
Glucose, Bld: 116 mg/dL — ABNORMAL HIGH (ref 70–99)
Potassium: 5 mEq/L (ref 3.5–5.1)
Sodium: 135 mEq/L (ref 135–145)

## 2010-11-21 LAB — HIV-1 RNA ULTRAQUANT REFLEX TO GENTYP+: HIV-1 RNA Quant, Log: 1.51 {Log} — ABNORMAL HIGH (ref <1.30)

## 2010-11-21 LAB — GLUCOSE, CAPILLARY: Glucose-Capillary: 111 mg/dL — ABNORMAL HIGH (ref 70–99)

## 2010-11-21 LAB — C-REACTIVE PROTEIN: CRP: 19 mg/dL — ABNORMAL HIGH (ref <0.6)

## 2010-11-21 LAB — CBC
MCH: 27.4 pg (ref 26.0–34.0)
MCV: 84.6 fL (ref 78.0–100.0)
Platelets: 114 10*3/uL — ABNORMAL LOW (ref 150–400)
RBC: 2.99 MIL/uL — ABNORMAL LOW (ref 4.22–5.81)
RDW: 17.8 % — ABNORMAL HIGH (ref 11.5–15.5)

## 2010-11-22 ENCOUNTER — Inpatient Hospital Stay (HOSPITAL_COMMUNITY): Payer: Medicaid Other

## 2010-11-22 ENCOUNTER — Other Ambulatory Visit: Payer: Self-pay | Admitting: Infectious Disease

## 2010-11-22 LAB — CBC
MCH: 26.9 pg (ref 26.0–34.0)
MCHC: 31.8 g/dL (ref 30.0–36.0)
MCV: 84.5 fL (ref 78.0–100.0)
Platelets: 127 10*3/uL — ABNORMAL LOW (ref 150–400)
RBC: 2.83 MIL/uL — ABNORMAL LOW (ref 4.22–5.81)

## 2010-11-22 LAB — URINALYSIS, ROUTINE W REFLEX MICROSCOPIC
Bilirubin Urine: NEGATIVE
Leukocytes, UA: NEGATIVE
Nitrite: NEGATIVE
Specific Gravity, Urine: 1.016 (ref 1.005–1.030)
pH: 5.5 (ref 5.0–8.0)

## 2010-11-22 LAB — BASIC METABOLIC PANEL
CO2: 22 mEq/L (ref 19–32)
Calcium: 8.1 mg/dL — ABNORMAL LOW (ref 8.4–10.5)
Glucose, Bld: 134 mg/dL — ABNORMAL HIGH (ref 70–99)
Sodium: 135 mEq/L (ref 135–145)

## 2010-11-22 LAB — QUANTIFERON TB GOLD ASSAY (BLOOD)
Interferon Gamma Release Assay: NEGATIVE
Mitogen Minus Nil Value: 2.23 IU/mL
Quantiferon Nil Value: 0.4 IU/mL
TB Antigen Minus Nil Value: <0 IU/mL

## 2010-11-22 LAB — URINE MICROSCOPIC-ADD ON

## 2010-11-22 LAB — GLUCOSE, CAPILLARY
Glucose-Capillary: 126 mg/dL — ABNORMAL HIGH (ref 70–99)
Glucose-Capillary: 136 mg/dL — ABNORMAL HIGH (ref 70–99)

## 2010-11-22 LAB — CREATININE, URINE, RANDOM: Creatinine, Urine: 94.3 mg/dL

## 2010-11-23 ENCOUNTER — Inpatient Hospital Stay (HOSPITAL_COMMUNITY): Payer: Medicaid Other

## 2010-11-23 DIAGNOSIS — R509 Fever, unspecified: Secondary | ICD-10-CM

## 2010-11-23 DIAGNOSIS — I059 Rheumatic mitral valve disease, unspecified: Secondary | ICD-10-CM

## 2010-11-23 LAB — CBC
MCH: 27.1 pg (ref 26.0–34.0)
MCHC: 32 g/dL (ref 30.0–36.0)
Platelets: 123 10*3/uL — ABNORMAL LOW (ref 150–400)
RDW: 18 % — ABNORMAL HIGH (ref 11.5–15.5)

## 2010-11-23 LAB — GLUCOSE, CAPILLARY
Glucose-Capillary: 135 mg/dL — ABNORMAL HIGH (ref 70–99)
Glucose-Capillary: 148 mg/dL — ABNORMAL HIGH (ref 70–99)

## 2010-11-23 LAB — BASIC METABOLIC PANEL
BUN: 24 mg/dL — ABNORMAL HIGH (ref 6–23)
CO2: 21 mEq/L (ref 19–32)
Calcium: 8.4 mg/dL (ref 8.4–10.5)
Calcium: 8 mg/dL — ABNORMAL LOW (ref 8.4–10.5)
Creatinine, Ser: 4.39 mg/dL — ABNORMAL HIGH (ref 0.4–1.5)
GFR calc Af Amer: 17 mL/min — ABNORMAL LOW (ref >60)
GFR calc non Af Amer: 14 mL/min — ABNORMAL LOW (ref >60)
Glucose, Bld: 141 mg/dL — ABNORMAL HIGH (ref 70–99)

## 2010-11-24 ENCOUNTER — Other Ambulatory Visit (HOSPITAL_COMMUNITY): Payer: Medicaid Other

## 2010-11-24 LAB — CBC
Hemoglobin: 7.4 g/dL — ABNORMAL LOW (ref 13.0–17.0)
MCH: 27.6 pg (ref 26.0–34.0)
MCHC: 32 g/dL (ref 30.0–36.0)
MCV: 86.2 fL (ref 78.0–100.0)
Platelets: 147 10*3/uL — ABNORMAL LOW (ref 150–400)

## 2010-11-24 LAB — GLUCOSE, CAPILLARY: Glucose-Capillary: 148 mg/dL — ABNORMAL HIGH (ref 70–99)

## 2010-11-24 LAB — CROSSMATCH
ABO/RH(D): O POS
DAT, IgG: POSITIVE
Unit division: 0

## 2010-11-24 LAB — BASIC METABOLIC PANEL
BUN: 24 mg/dL — ABNORMAL HIGH (ref 6–23)
CO2: 18 mEq/L — ABNORMAL LOW (ref 19–32)
Calcium: 8.1 mg/dL — ABNORMAL LOW (ref 8.4–10.5)
Creatinine, Ser: 4.69 mg/dL — ABNORMAL HIGH (ref 0.4–1.5)
GFR calc Af Amer: 16 mL/min — ABNORMAL LOW (ref >60)

## 2010-11-24 LAB — PROTIME-INR: Prothrombin Time: 15 seconds (ref 11.6–15.2)

## 2010-11-24 LAB — CULTURE, BLOOD (ROUTINE X 2)

## 2010-11-24 LAB — CRYOGLOBULIN

## 2010-11-24 LAB — HIV-1 GENOTYPR PLUS

## 2010-11-24 NOTE — Consult Note (Signed)
NAME:  Patrick [name], Patrick [name] NO.:  [id number]  MEDICAL RECORD NO.:  [id number]           PATIENT TYPE:  I  LOCATION:  5508                         FACILITY:  [facility]  PHYSICIAN:  Maree Krabbe, M.D.DATE OF BIRTH:  26-Sep-1961  DATE OF CONSULTATION:  11/23/2010 DATE OF DISCHARGE:                                CONSULTATION   REQUESTING PHYSICIAN:  Acey Lav, MD  REASON FOR CONSULT:  Acute renal failure.  HISTORY OF PRESENT ILLNESS:  The patient is a 50 year old male with history of HIV diagnosed in 2010 as well as high blood pressure and nephrolithiasis with lithotripsy in 1999.  He then presented in March  2010 with acute renal failure, newly diagnosed HIV, urosepsis due to Staph aureus, and left hydronephrosis due to ureteral stone.  He received antiretroviral therapy during that admission, a left renal stent and antibiotics, and his creatinine which was 7 on admission, improved down  to 2.1 at the time of discharge.  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  He saw Dr. Hyman Hopes in followup one time in the office after that and it was felt not to need any further followup.  He has been taking Atripla since then with a baseline creatinine of 1.3-1.5 over the last couple of years.  The patient was admitted on November 18, 2010, with a 2-week history of  high fevers at home up to 103.  He had also cervical and axillary and inguinal adenopathy.  He received fluids for the first few days of the hospitalization and creatinine improved from 1.9 on admission down to 1.4 on November 20, 2010.   He then received abdominal and chest CT scan with IV contrast and his  creatinine has been rising since, up to 4.39 today.  He is making urine- I's and O's showed 2300 yesterday and 1150 out.  The Renal Service is asked to see the patient for acute renal failure.  The left kidney size has significantly diminished since  2010 when it was of the same size of the right kidney at about 14 cm according to the imaging studies.  Now, the left kidney on the CAT scan is at sized at 10.5 cm with parenchymal scarring.    Also, the abdominal CT done on February 23rd showed hepatosplenomegaly, new lymphadenopathy, and a right renal stone with no hydronephrosis. The stone was in the renal pelvis.  There was mild left renal atrophy noted.  He had a repeat CT done on the evening of February 25th which  showed new right-sided hydronephrosis with persisting urogram (residual dye from February 23 study) with a transition point in the proximal right ureter.  There was no ureteral stone that could be identified or excluded according to the report.  The patient was seen by Urology on the same day.  I am not sure if they saw the second study, but their assessment was there was no evidence of any ureteral stone and no need for expulsive therapy at that time.  Also of note the tenofovir that the patient was taking on  admission has been discontinued for now due to its known potential nephrotoxic side effects and he is currently getting efavirenz, lamivudine, and raltegravir for his antiretroviral therapy.  The patient reports poor urine output over the last several days, but he thinks he is starting to improve today.  His fevers are improving.  He says  the lymphadenopathy is not really changed.  PAST MEDICAL HISTORY: 1. Hypertension, diagnosed in 2010, takes labetalol only. 2. HIV, diagnosed in 2010. 3. Chronic kidney disease.  As above, baseline creatinine 1.3-1.4. 4. History of kidney stones with lithotripsy in 2000 and 1999.  PAST SURGICAL HISTORY:  Appendectomy and lithotripsy.  ALLERGIES: 1. SULFA MEDICATIONS. 2. CODEINE.  SOCIAL HISTORY:  He is single, lives alone.  Occasional alcohol.  He is adopted.  Says he has no family.  He has a friend here who he identifies that as his primary contact, Waynard Reeds.  REVIEW OF SYSTEMS:  Swollen glands in the neck and armpits, cough with clear sputum, some cramping in the hands, left ankle brace which has been there since 2010 which was treated with a year of Diflucan and Zithromax which has been stopped.  He has some numbness in the tip of his left foot.  He thinks he may have some neuropathy, but he is not sure.  Otherwise, review of systems is negative.  He denies any chest pain, nausea, vomiting, diarrhea, or joint pain.  PHYSICAL EXAMINATION:  VITAL SIGNS:  Temp 97.8, blood pressure 126/80, O2 sat 98% on room air, and pulse 75. GENERAL:  This is an overweight, but healthy-appearing, well-developed, well-nourished male, in no distress. SKIN:  Warm and dry without rash, cyanosis, or edema. HEENT:  PERRLA, EOMI.  Throat is clear and moist. NECK:  Supple.  Jugular venous pressure is elevated at 14 cm.  He has some nontender lymphadenopathy in the neck and in the axilla bilaterally. CHEST:  Clear throughout.  Excellent air movement.  No rales, rhonchi, or wheezing. CARDIAC:  Regular rate and rate and rhythm without murmur, rub, or gallop. ABDOMEN:  Obese, soft, and nontender with active bowel sounds.  No masses.  No ascites. GU:  Normal male genitalia. RECTAL:  Deferred. EXTREMITIES:  No joint effusion or deformity.  No calf cords or tenderness.  No ankle edema.  Good pulses in the feet and hands, 2+. NEUROLOGICAL:  Alert and oriented x3.  No focal deficits.  LABORATORY DATA:  Sodium 137, potassium 5.2, BUN 23, creatinine 4.39, and calcium 8.4.  Hemoglobin 7.7, white blood count 3.8, and platelets 123.  Urinalysis:  30 protein, no red cells or white cells, urine sodium 98.  CAT scans as above.  The renal ultrasound from today showed no change in mild right hydronephrosis, 14.2 cm right kidney and no hydronephrosis on the left 10.2 cm with some mild scarring of the left kidney.  IMPRESSION: 1. Acute kidney injury due to contrast  nephropathy most likely. Also     possible contributing is a new right-sided hydronephrosis with known     history of right intrarenal stone. 2. Human immunodeficiency virus with fever and lymphadenopathy.     Workup in progress. 3. Chronic kidney disease.  Baseline creatinine 1.4-1.5.  The patient     has scarring of the left kidney with decrease in size since 2010     and I suspect this is probably due recurrent pyelonephritis.  I doubt     that HIV nephropathy is playing a role at this time, since there is not  significant proteinuria.  RECOMMENDATIONS:  Agree with supportive care, holding tenofovir and giving IV fluids.  Avoid IV contrast, NSAIDs, ACE inhibitors, and ARBs. Would alert Urology in the morning about the new hydronephrosis noted on the second CAT scan done on November 22, 2010.  Discussed with the Medicine Teaching Service resident on call.  We will follow.  He may need dialysis temporarily, but has no indication currently.     Maree Krabbe, M.D.     RDS/MEDQ  D:  11/23/2010  T:  11/24/2010  Job:  [id number]  Electronically Signed by Delano Metz M.D. on 11/24/2010 08:23:18 AM

## 2010-11-24 NOTE — Consult Note (Signed)
NAME:  Patrick [name], Patrick [name] NO.:  [id number]  MEDICAL RECORD NO.:  [id number]           PATIENT TYPE:  I  LOCATION:  5508                         FACILITY:  [facility]  PHYSICIAN:  Marquon Alcala I. Samyia Motter, M.D.DATE OF BIRTH:  Dec 13, 1960  DATE OF CONSULTATION: DATE OF DISCHARGE:                                CONSULTATION   SUBJECTIVE:  This is a 50 year old HIV-positive male admitted on February 21 (4 days ago) with fever of unknown origin, unresolved upper respiratory infection, and history of multiple staph aureus infections in his blood, urine, and left knee.  The patient was admitted with a temperature of 103; with no nausea, vomiting, diarrhea, dysuria, or weight change.  On admission to the hospital, the patient was given IV fluids and underwent CT scanning, which showed an 8-mm right renal pelvic stone in addition to multiple other problems including a 2.2-cm gallstone, enlarged perisplenic fluid, and large volume of adenopathy. The patient had ultrasound, which was said to show mild-to-moderate hydronephrosis; however, CT scan does not show this to be the case.  PAST HISTORY:  Significant for 1. Nephrolithiasis, post lithotripsy in 1999. 2. History of staph abscess of the left kidney in 2010. 3. No history of hepatitis C. 4. HIV with CD-4 count 140. 5. Hypertension.  REVIEW OF SYSTEMS:  Constitutional review of systems is significant for dehydration, with no diarrhea.  Remaining review of systems is noncontributory.  CURRENT MEDICATIONS: 1. Atripla (efavirenz-emtricitab-tenofovir). 2. Dapsone. 3. Labetalol. 4. Diflucan. 5. Tamiflu. 6. Zithromax.  SOCIAL HISTORY:  Tobacco is none.  Alcohol none.  The patient is adopted.  FAMILY HISTORY:  There is no significant family history that is known.  ADMISSION PHYSICAL EXAMINATION:  GENERAL:  A well-developed, well- nourished white male in no acute distress. VITAL SIGNS:  Blood pressure is 111/62, temperature  99, pulse 77, and respiratory rate 17. ABDOMEN:  Moderately obese.  It is benign.  No pain to palpation. MUSCULOSKELETAL:  The flank is benign.  LABORATORY DATA:  Urine pH of 5.5.  ASSESSMENT:  Patrick [name] has an 8-mm right renal pelvic stone without hydronephrosis.  He has multiple other medical issues as noted above. 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  He does take antiretroviral drugs, which, with association with dehydration, may lead to medication stones.  However, he does have a history of calcium oxalate stones and is post lithotripsy in 1999.  Of concern is the urine pH of 5.5, which may induce both medications stones and uric acid stones.  However, I do not see any stones in the ureter or any collection, any papillary necrosis or other tissue in his ureter on the right side.  PLAN:  We will advise IV hydration for now.  There is no need for medical expulsive therapy at this time, without ureteral stone present. I will discuss the case with Dr. Vernie Ammons on Monday.     Ashwika Freels I. Patsi Sears, M.D.     SIT/MEDQ  D:  11/22/2010  T:  11/23/2010  Job:  [id number]  cc:  Mark C. Vernie Ammons, M.D. Carrolyn Meiers, MD  Electronically Signed by Jethro Bolus M.D. on 11/24/2010 09:36:27 AM

## 2010-11-25 ENCOUNTER — Other Ambulatory Visit: Payer: Self-pay | Admitting: General Surgery

## 2010-11-25 LAB — RENAL FUNCTION PANEL
Albumin: 2 g/dL — ABNORMAL LOW (ref 3.5–5.2)
BUN: 26 mg/dL — ABNORMAL HIGH (ref 6–23)
Calcium: 8.3 mg/dL — ABNORMAL LOW (ref 8.4–10.5)
Glucose, Bld: 174 mg/dL — ABNORMAL HIGH (ref 70–99)
Phosphorus: 6.4 mg/dL — ABNORMAL HIGH (ref 2.3–4.6)
Potassium: 4.7 mEq/L (ref 3.5–5.1)
Sodium: 140 mEq/L (ref 135–145)

## 2010-11-25 LAB — PROTEIN ELECTROPHORESIS, SERUM
Alpha-1-Globulin: 10 % — ABNORMAL HIGH (ref 2.9–4.9)
Alpha-2-Globulin: 12.6 % — ABNORMAL HIGH (ref 7.1–11.8)
Beta Globulin: 6 % (ref 4.7–7.2)
Total Protein ELP: 6.6 g/dL (ref 6.0–8.3)

## 2010-11-25 LAB — CULTURE, BLOOD (ROUTINE X 2)
Culture  Setup Time: 201202220151
Culture: NO GROWTH

## 2010-11-25 LAB — GLUCOSE, CAPILLARY
Glucose-Capillary: 182 mg/dL — ABNORMAL HIGH (ref 70–99)
Glucose-Capillary: 145 mg/dL — ABNORMAL HIGH (ref 70–99)

## 2010-11-25 LAB — CBC
HCT: 23.5 % — ABNORMAL LOW (ref 39.0–52.0)
MCHC: 31.9 g/dL (ref 30.0–36.0)
Platelets: 159 10*3/uL (ref 150–400)
RDW: 18 % — ABNORMAL HIGH (ref 11.5–15.5)
WBC: 4.2 10*3/uL (ref 4.0–10.5)

## 2010-11-25 NOTE — Assessment & Plan Note (Signed)
Summary: FEVER/VS    Vital Signs:  Patient profile:   50 year old male Height:      72 inches Weight:      225.2 pounds BMI:     30.65 Temp:     99.1 degrees F oral Pulse rate:   102 / minute BP sitting:   134 / 80  (left arm)  Vitals Entered By: Alesia Morin CMA (November 18, 2010 3:39 PM) CC: pt in today still sick, with swollen glands, earaches, nodes under his arms are swollen also, he was in last week and is not feeling very well. Is Patient Diabetic? No Pain Assessment Patient in pain? no      Nutritional Status BMI of > 30 = obese Nutritional Status Detail appetite "not good"  Have you ever been in a relationship where you felt threatened, hurt or afraid?No   Does patient need assistance? Functional Status Self care Ambulation Normal Comments no missed meds   Primary Provider:  Cliffton Asters MD  CC:  pt in today still sick, with swollen glands, earaches, nodes under his arms are swollen also, and he was in last week and is not feeling very well.Marland Kitchen  History of Present Illness: Ongoing fevers, swollen lymph nodes, malaise, non-exertional fatigue for >7 days.  seen by Dr. Orvan Falconer on Thursday 11/13/10 was given Z-pak and Tamiflu, but symptoms did not improve.  Las night had temp spike of 102.5.  States in last 2 days lymph nodes in neck and axilla have become "very large," but denies any significant pain.  Has c/o head congestion and both ears painful with some tinnitus.  Denies cough, SOB, DOE, sputum production.  (+) anorexia, but no nausea, vomitting or diarrhea.  Denies visual disturbances, cognition impairment, balance/coordination problems, or neck stiffness.  Preventive Screening-Counseling & Management  Alcohol-Tobacco     Alcohol drinks/day: 1     Alcohol type: all     Smoking Status: never  Caffeine-Diet-Exercise     Caffeine use/day: yes     Does Patient Exercise: yes     Type of exercise: bicycling     Exercise (avg: min/session): 30-60  Times/week: 3  Hep-HIV-STD-Contraception     HIV Risk: risk noted     HIV Risk Counseling: not indicated-no HIV risk noted  Safety-Violence-Falls     Seat Belt Use: yes      Sexual History:  n/a.        Drug Use:  never.    Allergies: 1)  ! Codeine 2)  ! Sulfa  Review of Systems General:  Complains of chills, fatigue, fever, loss of appetite, malaise, sweats, and weakness. Eyes:  Denies blurring, discharge, double vision, eye irritation, eye pain, halos, itching, light sensitivity, red eye, vision loss-1 eye, and vision loss-both eyes. ENT:  Complains of earache, nasal congestion, and ringing in ears; denies decreased hearing, difficulty swallowing, ear discharge, hoarseness, nosebleeds, postnasal drainage, sinus pressure, and sore throat. CV:  Complains of fatigue; denies chest pain or discomfort, difficulty breathing at night, difficulty breathing while lying down, fainting, lightheadness, near fainting, palpitations, and shortness of breath with exertion. Resp:  Denies chest discomfort, chest pain with inspiration, cough, pleuritic, shortness of breath, sputum productive, and wheezing. GI:  Complains of loss of appetite; denies abdominal pain, bloody stools, constipation, dark tarry stools, diarrhea, nausea, and vomiting. GU:  Denies dysuria, hematuria, urinary frequency, and urinary hesitancy. MS:  diffuse arthralgias and myalgias with fever spikes.. Derm:  Denies dryness, flushing, itching, lesion(s), and rash. Neuro:  Complains of weakness; denies difficulty with concentration, disturbances in coordination, falling down, headaches, memory loss, poor balance, seizures, tingling, tremors, and visual disturbances. Psych:  Complains of depression; denies alternate hallucination ( auditory/visual), anxiety, easily angered, easily tearful, irritability, panic attacks, and suicidal thoughts/plans. Endo:  Denies cold intolerance, excessive hunger, excessive thirst, and excessive  urination. Heme:  Complains of enlarge lymph nodes, fevers, and pallor; denies abnormal bruising, bleeding, and skin discoloration.  Physical Exam  General:  alert, well-developed, pale, fatigued and uncomfortable-appearing.   Head:  normocephalic, atraumatic, no abnormalities observed, and no abnormalities palpated.   Eyes:  vision grossly intact, pupils equal, pupils round, and pupils reactive to light.   Ears:  Bilateral TM's bulging with noted air-fluid level Nose:  no external deformity, no external erythema, and no nasal discharge.   Mouth:  fair dentition, mucous membranes dry.   Neck:  supple and full ROM.   Chest Wall:  no deformities and no tenderness.   Lungs:  normal respiratory effort, no intercostal retractions, normal breath sounds, no dullness, and no crackles.   Heart:  regular rhythm, no murmur, no gallop, no rub, no JVD, and tachycardia.   Abdomen:  soft, non-tender, normal bowel sounds, no distention, no hepatomegaly, and no splenomegaly.   Msk:  normal ROM and no joint tenderness.   Extremities:  No clubbing, cyanosis, edema, or deformity noted with normal full range of motion of all joints.   Neurologic:  alert & oriented X3, cranial nerves II-XII intact, strength normal in all extremities, and gait normal.   Skin:  no rashes, no suspicious lesions, no ecchymoses, no petechiae, and pale.   Cervical Nodes:  R anterior LN enlarged, R posterior LN enlarged, L anterior LN enlarged, and L posterior LN enlarged.  All nontneder  Axillary Nodes:  R axillary LN enlarged, R axillary LN tender, L axillary LN enlarged, and L axillary LN tender.   Inguinal Nodes:  R inguinal LN enlarged and L  inguinal LN enlarged.   Psych:  Oriented X3, memory intact for recent and remote, normally interactive, good eye contact, and slightly anxious.     Impression & Recommendations:  Problem # 1:  FEVER, HX OF (ICD-V15.9) While in clinic his temperature rose from 99.1 to 100.1.  He general  appearance is acute in nature, and still appears unchanged in spite of therapy.  Would have originally discharged to home on by mouth Cipro, but due to the unresolved nature of his symptoms and the overall clinical presentation, admission to the hospital for hydration and further work-up is warranted.  Discussed this with Dr. Daiva Eves who agreed with plan of care.  Bed control notified, report called to admitting resident, patient escorted to admitting via w/c.  Problem # 2:  LYMPHADENOPATHY, DIFFUSE (ICD-785.6) Plan as per #1 His updated medication list for this problem includes:    Zithromax 1 Gm Pack (Azithromycin) .Marland Kitchen... Take as directed      Problem # 3:  OTITIS MEDIA (ICD-382.9) Doubtful this is the etiology as it developed over past 2-3 days.  Should be evaluated further once admitted. His updated medication list for this problem includes:    Zithromax 1 Gm Pack (Azithromycin) .Marland Kitchen... Take as directed  Medications Added to Medication List This Visit: 1)  Ciprofloxacin Hcl 500 Mg Tabs (Ciprofloxacin hcl) .Marland Kitchen.. 1 tablet by mouth every 12 hours for 10 days Prescriptions: CIPROFLOXACIN HCL 500 MG TABS (CIPROFLOXACIN HCL) 1 tablet by mouth every 12 hours for 10 days  #20 x  0   Entered and Authorized by:   Talmadge Chad NP   Signed by:   Talmadge Chad NP on 11/18/2010   Method used:   Print then Give to Patient   RxID:   [id number]  Rx not dispensed to patient due to admission to hospital.

## 2010-11-25 NOTE — Progress Notes (Signed)
Summary: c/o fevers  Phone Note Call from Patient   Caller: Patient Summary of Call: pt. called c/o of ongoing fevers since starting Tamiflu 11/13/10.  appt. scheduled for today with [name]. Initial call taken by: Wendall Mola CMA Duncan Dull),  November 18, 2010 9:01 AM

## 2010-11-25 NOTE — Miscellaneous (Signed)
  Clinical Lists Changes  Observations: Added new observation of PCTFPL: 126.37  (11/17/2010 15:05) Added new observation of HOUSEINCOME: 16109  (11/17/2010 15:05)      net income [phone]

## 2010-11-25 NOTE — Initial Assessments (Signed)
INTERNAL MEDICINE ADMISSION HISTORY AND PHYSICAL  Attending: Dr. Daiva Eves  First contact: Dr. Anselm Jungling [phone] Second contact: Dr. Denton Meek  [phone] [facility], after-hours: [phone], [phone])  PCP: Dr. Orvan Falconer  CC: Fever  HPI: Pt is a 50 yo male with PMH of HIV and Hx of multiple infections of Staph Aureus in blood, urine and MRSA in left knee who was admitted from ID clinic for fever. He started to have fever about one week ago, also had chill and dry cough, but no sweating, running nose, muscle pain. He said he took Temp about 101-103 and highest 103. He went to see Dr. Orvan Falconer on 11/13/10 and was given Z-pak and Tamiflu, but symptoms did not improve.He also noticed enlarged lymph nodes in neck and axilla with mild tenderness and went to ID clinc today and he was asked to be admitted to the hospital. He denies sick contacts, no N/V/Diarrhea, dysuria or weight change. Denies smoking, ETOH or drug abuse.    ALLERGIES: ! CODEINE ! 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 Bacteremia of STAPHYLOCOCCUS AUREUS in 11/2008 and 12/2008 Left kneee infection of MRSA 06/2007 Status post appendectomy in 2001.  kidney stones s/p lithotripsy in 1999   MEDICATIONS: ATRIPLA 600-200-300 MG TABS (EFAVIRENZ-EMTRICITAB-TENOFOVIR) Take 1 tablet by mouth once a day DAPSONE 100 MG TABS (DAPSONE) Take 1 tablet by mouth once a day LABETALOL HCL 200 MG TABS (LABETALOL HCL) Take 1 tablet by mouth two times a day DIFLUCAN 100 MG TABS (FLUCONAZOLE) Take 1 tablet by mouth once a day TAMIFLU 75 MG CAPS (OSELTAMIVIR PHOSPHATE) Take 1 capsule by  mouth two times a day ZITHROMAX 1 GM PACK (AZITHROMYCIN) Take as directed   SOCIAL HISTORY: The patient does not smoke cigarettes or drink alcohol. He was in the sales business.  FAMILY HISTORY: Both parents are deceased.  No family history of diabetes mellitus or CAD.  ROS:No CP, SOB, N/V/Diarrhea, dysuria or weight change. Others see HPI  VITALS: T: 99.0 P: 103  BP: 143/70  R: 20  O2SAT:  98% ON:RA  PHYSICAL EXAM: GEN: NAD HEENT: PERRL, EOMI, no icterus, no pallor NECK: supple, no JVD, there are enlarged cervical LN with mild tenderness. LUNGS: clear to auscultation bilaterally, no wheezing/crackles.  CVS: RR, tachycardia, no murmur/rubs/gallops ABD: Soft, non-tender/distension, normal bowel sounds. EXTREMITIES: No edema or cyanosis, but left ankle a 5x6 cm rash, no exudate NEURO: Alert &Oriented x3, no focal motor or sensory deficits.    LABS:   ASSESSMENT AND PLAN:  1. Fever of unknow source: The cause is unclear, this may be due to IRIS with VL undetectable, other possible causes include MAC, endocarditis, pulm infection including URI, UTI or autoimmune disease. TB or maligancy less likely.     Plan:  - Panculture of Blood, urine, urine strep and legionella, UDS.  - Start Rocephin and azithromycin - Continue his ART - CXR  - Symptom control with tylenol and mucinex - OIVF for rehydration and check orthostatic vitals - Will discuss with attending if TEE needed if no source is not identified.   2. HIV: Will check CD4 and VL. Continue ART with dapsone prophylaxis.   3. HTN: Will continue  home meds and hold it if SBP <100.  4. Left foot fungus: Continue diflucan.   5.VTE PROPH: lovenox    ATTENDING: I performed and/or observed a history and physical examination of the patient.  I discussed the case with the residents as noted and reviewed the residents' notes.  I agree with the findings and plan--please refer to the attending physician note for more  details.  Signature________________________________  Printed Name_____________________________

## 2010-11-26 DIAGNOSIS — R509 Fever, unspecified: Secondary | ICD-10-CM

## 2010-11-26 LAB — CBC
HCT: 24.3 % — ABNORMAL LOW (ref 39.0–52.0)
MCH: 27.8 pg (ref 26.0–34.0)
MCHC: 32.1 g/dL (ref 30.0–36.0)
MCV: 86.5 fL (ref 78.0–100.0)
RDW: 17.8 % — ABNORMAL HIGH (ref 11.5–15.5)

## 2010-11-26 LAB — RENAL FUNCTION PANEL
BUN: 26 mg/dL — ABNORMAL HIGH (ref 6–23)
Calcium: 8.2 mg/dL — ABNORMAL LOW (ref 8.4–10.5)
Creatinine, Ser: 4.86 mg/dL — ABNORMAL HIGH (ref 0.4–1.5)
Glucose, Bld: 141 mg/dL — ABNORMAL HIGH (ref 70–99)
Phosphorus: 6 mg/dL — ABNORMAL HIGH (ref 2.3–4.6)

## 2010-11-26 LAB — GLUCOSE, CAPILLARY
Glucose-Capillary: 132 mg/dL — ABNORMAL HIGH (ref 70–99)
Glucose-Capillary: 124 mg/dL — ABNORMAL HIGH (ref 70–99)

## 2010-11-26 NOTE — Consult Note (Signed)
NAME:  DELANE, [name]:  [id number]  MEDICAL RECORD NO.:  [id number]           PATIENT TYPE:  I  LOCATION:  5508                         FACILITY:  [facility]  PHYSICIAN:  Angelia Mould. Derrell Lolling, M.D.DATE OF BIRTH:  1960-12-05  DATE OF CONSULTATION:  11/24/2010 DATE OF DISCHARGE:                                CONSULTATION   REQUESTING PHYSICIANS:  Carrolyn Meiers, MD/Cornelius Daiva Eves, MD  CONSULTED SURGEON:  Angelia Mould. Derrell Lolling, MD  REASON FOR CONSULTATION:  Lymphadenopathy - request biopsy.  HISTORY OF PRESENT ILLNESS:  [name] is a pleasant 50 year old gentleman who was diagnosed with HIV in 2010 and also has a history of nephrolithiasis.  He states that he has had about 2-week history of fevers and flu-like symptoms.  He was treated with Tamiflu but did not show significant improvement, eventually having persistent fevers and then started noticing some swelling in his axilla consistent with lymphadenopathy.  He presented for evaluation.  He states that the lymphadenopathy has now been going on almost a week with the fevers extending several days prior to that.  He was admitted and has been worked up for this fever and lymphadenopathy.  In addition, the patient has been found with evidence of hydronephrosis on the right side with possible right ureteral obstruction secondary possible stone.  Urology apparently has been consulted and is assisting with management of that. However, they have requested surgical consultation for lymph node biopsies to aid in diagnostic purposes.  PAST MEDICAL HISTORY:  Significant for: 1. Hypertension. 2. HIV. 3. Chronic renal insufficiency. 4. History of nephrolithiasis.  PAST SURGICAL HISTORY:  Appendectomy in 2000 for perforated appendicitis requiring open resection.  SOCIAL HISTORY:  The patient is single, has an occasional alcoholic beverage but denies any illicit drug use.  FAMILY HISTORY:  Noncontributory to the  present case.  ALLERGIES: 1. SULFA. 2. CODEINE.  MEDICATIONS:  Atripla, dapsone, labetalol, Diflucan, Tamiflu, and Zithromax recently.  REVIEW OF SYSTEMS:  Please see history of present illness for pertinent findings.  PHYSICAL EXAMINATION:  GENERAL:  A 50 year old gentleman who is moderately obese, does not appear to be in any acute distress or is nontoxic appearing. VITAL SIGNS:  Temperature of 98.2, heart rate of 74, blood pressure 119/71, respiratory rate of 19, and oxygen saturation 96% on room air. ENT:  Unremarkable. NECK:  Supple.  There is lymphadenopathy along the cervical chain which is nontender.  The lymph nodes are mobile, but easily palpable. LUNGS:  Clear to auscultation.  No wheezes, rhonchi, or rales.  Normal respiratory effort without use of accessory muscles.  HEART:  Regular rate and rhythm.  No murmurs, gallops, or rubs.  Carotids are 2+ and brisk without bruits.  Peripheral pulses intact and symmetric. ABDOMEN:  Soft, flat, nontender, and nondistended.  No hernias are seen. Midline surgical scar compatible with his prior history is noted.  No organomegaly is appreciated. RECTAL:  Deferred. EXTREMITIES:  Good active range of motion of all extremities without crepitus or pain.  Normal muscle strength and tone without atrophy. SKIN:  Otherwise, warm and dry without rashes, lesions, or nodules. LYMPHATIC:  The patient has bilateral  palpable axillary lymphadenopathy, left side greater than the right.  Nontender.  No overlying skin changes.  No drainage.  The patient has bilateral inguinal lymphadenopathy right side greater than left side without overlying skin changes or drainage.  No extremity lesions or wounds are noted. NEUROLOGIC:  The patient is alert and oriented x3.  DIAGNOSTICS:  Most recent CBC shows a white blood cell count of 3.6, hemoglobin of 7.4, hematocrit of 23.1, and platelet count 147. Metabolic panel shows a sodium of 136, potassium of 5.0,  chloride of 111, CO2 of 18, BUN of 24, creatinine of 4.6, and glucose of 138.  Coags ordered by US show an INR of 1.1 and PTT stable at 35.  IMAGING:  Again, imaging of the abdomen and pelvis x2 shows evidence of bilateral axillary, mediastinal, and retroperitoneal lymphadenopathy in addition to inguinal adenopathy as well.  Incidental finding of splenomegaly and cholelithiasis as well as a right nephrolithiasis.  IMPRESSION: 1. Fever of unknown origin. 2. Diffuse lymphadenopathy. 3. Human immunodeficiency virus. 4. Acute renal insufficiency.  PLAN:  Having checked the patient's coags, it appears that they are stable.  The patient could be acceptable for excisional lymph node biopsy.  We would plan to pursue this today if the operating room schedule is permitting.  I have discussed the procedure with the patient including the risks, complications, and postoperative expectation.  He agrees and will consent to this procedure.  Dr. Derrell Lolling has seen this patient with me in consultation.     Brayton El, PA-C   ______________________________ Angelia Mould. Derrell Lolling, M.D.    KB/MEDQ  D:  11/24/2010  T:  11/25/2010  Job:  [id number]  Electronically Signed by Brayton El  on 11/25/2010 11:32:46 AM Electronically Signed by Claud Kelp M.D. on 11/26/2010 01:27:46 PM

## 2010-11-27 ENCOUNTER — Inpatient Hospital Stay (HOSPITAL_COMMUNITY): Payer: Medicaid Other

## 2010-11-27 DIAGNOSIS — R509 Fever, unspecified: Secondary | ICD-10-CM

## 2010-11-27 DIAGNOSIS — B20 Human immunodeficiency virus [HIV] disease: Secondary | ICD-10-CM

## 2010-11-27 LAB — GLUCOSE, CAPILLARY: Glucose-Capillary: 108 mg/dL — ABNORMAL HIGH (ref 70–99)

## 2010-11-27 LAB — CBC
HCT: 24 % — ABNORMAL LOW (ref 39.0–52.0)
Platelets: 206 10*3/uL (ref 150–400)
RBC: 2.78 MIL/uL — ABNORMAL LOW (ref 4.22–5.81)
RDW: 17.7 % — ABNORMAL HIGH (ref 11.5–15.5)
WBC: 5.7 10*3/uL (ref 4.0–10.5)

## 2010-11-27 LAB — BASIC METABOLIC PANEL
CO2: 29 mEq/L (ref 19–32)
Calcium: 8.4 mg/dL (ref 8.4–10.5)
Creatinine, Ser: 4.98 mg/dL — ABNORMAL HIGH (ref 0.4–1.5)
GFR calc Af Amer: 15 mL/min — ABNORMAL LOW (ref >60)

## 2010-11-28 ENCOUNTER — Ambulatory Visit (HOSPITAL_COMMUNITY): Admission: RE | Admit: 2010-11-28 | Payer: Medicaid Other | Source: Ambulatory Visit | Admitting: Urology

## 2010-11-28 LAB — RENAL FUNCTION PANEL
Albumin: 2.7 g/dL — ABNORMAL LOW (ref 3.5–5.2)
BUN: 27 mg/dL — ABNORMAL HIGH (ref 6–23)
CO2: 28 mEq/L (ref 19–32)
Calcium: 9.1 mg/dL (ref 8.4–10.5)
Chloride: 103 mEq/L (ref 96–112)
Creatinine, Ser: 4.98 mg/dL — ABNORMAL HIGH (ref 0.4–1.5)
Creatinine, Ser: 4.42 mg/dL — ABNORMAL HIGH (ref 0.4–1.5)
GFR calc Af Amer: 17 mL/min — ABNORMAL LOW (ref >60)
GFR calc non Af Amer: 14 mL/min — ABNORMAL LOW (ref >60)

## 2010-11-28 LAB — TISSUE CULTURE

## 2010-11-28 LAB — GLUCOSE, CAPILLARY: Glucose-Capillary: 90 mg/dL (ref 70–99)

## 2010-11-28 LAB — MISCELLANEOUS TEST

## 2010-11-28 LAB — CBC
Hemoglobin: 7.5 g/dL — ABNORMAL LOW (ref 13.0–17.0)
MCH: 27.2 pg (ref 26.0–34.0)
MCV: 86.2 fL (ref 78.0–100.0)
RBC: 2.76 MIL/uL — ABNORMAL LOW (ref 4.22–5.81)

## 2010-11-29 LAB — GLUCOSE, CAPILLARY
Glucose-Capillary: 128 mg/dL — ABNORMAL HIGH (ref 70–99)
Glucose-Capillary: 118 mg/dL — ABNORMAL HIGH (ref 70–99)

## 2010-11-29 LAB — RENAL FUNCTION PANEL
CO2: 28 mEq/L (ref 19–32)
Calcium: 9 mg/dL (ref 8.4–10.5)
Chloride: 106 mEq/L (ref 96–112)
Glucose, Bld: 136 mg/dL — ABNORMAL HIGH (ref 70–99)
Sodium: 142 mEq/L (ref 135–145)

## 2010-11-29 LAB — CBC
HCT: 25.3 % — ABNORMAL LOW (ref 39.0–52.0)
Hemoglobin: 7.9 g/dL — ABNORMAL LOW (ref 13.0–17.0)
MCH: 27.1 pg (ref 26.0–34.0)
MCHC: 31.2 g/dL (ref 30.0–36.0)

## 2010-11-30 LAB — RENAL FUNCTION PANEL
Albumin: 2.8 g/dL — ABNORMAL LOW (ref 3.5–5.2)
Calcium: 9.3 mg/dL (ref 8.4–10.5)
Chloride: 108 mEq/L (ref 96–112)
Creatinine, Ser: 2.43 mg/dL — ABNORMAL HIGH (ref 0.4–1.5)
GFR calc Af Amer: 34 mL/min — ABNORMAL LOW (ref >60)
GFR calc non Af Amer: 29 mL/min — ABNORMAL LOW (ref >60)
Phosphorus: 4.9 mg/dL — ABNORMAL HIGH (ref 2.3–4.6)

## 2010-11-30 LAB — GLUCOSE, CAPILLARY
Glucose-Capillary: 104 mg/dL — ABNORMAL HIGH (ref 70–99)
Glucose-Capillary: 99 mg/dL (ref 70–99)
Glucose-Capillary: 196 mg/dL — ABNORMAL HIGH (ref 70–99)

## 2010-11-30 LAB — CBC
MCH: 28.1 pg (ref 26.0–34.0)
MCHC: 32 g/dL (ref 30.0–36.0)
Platelets: 200 10*3/uL (ref 150–400)
RBC: 2.88 MIL/uL — ABNORMAL LOW (ref 4.22–5.81)
RDW: 17.7 % — ABNORMAL HIGH (ref 11.5–15.5)

## 2010-12-01 ENCOUNTER — Encounter (INDEPENDENT_AMBULATORY_CARE_PROVIDER_SITE_OTHER): Payer: Self-pay | Admitting: *Deleted

## 2010-12-01 ENCOUNTER — Encounter: Payer: Self-pay | Admitting: Internal Medicine

## 2010-12-01 LAB — CBC
MCV: 87.1 fL (ref 78.0–100.0)
Platelets: 220 10*3/uL (ref 150–400)
RDW: 17.9 % — ABNORMAL HIGH (ref 11.5–15.5)
WBC: 5.8 10*3/uL (ref 4.0–10.5)

## 2010-12-01 LAB — BASIC METABOLIC PANEL
BUN: 20 mg/dL (ref 6–23)
GFR calc Af Amer: 39 mL/min — ABNORMAL LOW (ref >60)
GFR calc non Af Amer: 32 mL/min — ABNORMAL LOW (ref >60)
Potassium: 4.7 mEq/L (ref 3.5–5.1)
Sodium: 140 mEq/L (ref 135–145)

## 2010-12-01 LAB — GLUCOSE, CAPILLARY: Glucose-Capillary: 110 mg/dL — ABNORMAL HIGH (ref 70–99)

## 2010-12-04 NOTE — Op Note (Signed)
NAME:  Patrick [name], Patrick [name]             ACCOUNT NO.:  [id number]  MEDICAL RECORD NO.:  [id number]           PATIENT TYPE:  O  LOCATION:  5508                         FACILITY:  [facility]  PHYSICIAN:  Loyd Marhefka C. Vernie Ammons, M.D.  DATE OF BIRTH:  04-13-1961  DATE OF PROCEDURE:  11/28/2010 DATE OF DISCHARGE:                              OPERATIVE REPORT   PREOPERATIVE DIAGNOSES: 1. Right ureteral obstruction. 2. Right renal calculi.  POSTOPERATIVE DIAGNOSES: 1. Right ureteral calculus. 2. Right renal calculi.  PROCEDURES: 1. Cystoscopy with right retrograde pyelogram including     interpretation. 2. Right ureteroscopy. 3. Laser lithotripsy. 4. Ureteroscopic stone extraction. 5. Right ureteral stent placement.  SURGEON:  Timya Trimmer C. Vernie Ammons, M.D.  ANESTHESIA:  General.  DRAINS:  8-French, 26-cm Polaris stent (with string).  ESTIMATED BLOOD LOSS:  Approximately 10 cc.  SPECIMENS:  Stone given to the patient.  COMPLICATIONS:  None.  INDICATIONS:  The patient is a 50 year old male who has a history of nephrolithiasis.  He has been asymptomatic for a number of years but while in the hospital he developed right hydronephrosis which was seen by ultrasound and there was a contrasted CT that revealed holdup of contrast in the upper ureter consistent with obstruction.  The patient also has an atrophic contralateral kidney and therefore I have discussed ureteroscopy and treatment of the stone, if found, with stent placement to unobstruct his better kidney.  The risks, complications, alternatives and limitations were discussed.  He understands and has elected to proceed.  DESCRIPTION OF OPERATION:  After informed consent, the patient was brought to the major OR, placed on the table and administered general anesthesia.  He was then moved to the dorsal lithotomy position and his genitalia was sterilely prepped and draped and an official time-out was then performed.  The 22-French cystoscope was  then passed under direct vision down the urethra which was noted to be normal.  The prostatic urethra revealed no lesions.  Upon entering the bladder, I noted no tumor, stones or inflammatory lesions on full and systematic inspection.  Ureteral orifices were of normal configuration and position.  Right retrograde pyelography was then performed by first passing a 6- [place] open-ended ureteral catheter through the cystoscope and into the right distal ureter through the orifice.  I then injected full strength contrast under direct fluoroscopy and noted the contrast passing up to the area just below the UPJ on the right-hand side but not beyond.  This is consistent with where he was noted to have some obstruction on his CT scan.  I therefore passed the open-ended catheter up to this level and then passed a 0.038-inch floppy-tipped guidewire through the open-ended catheter and was able to pass this into the renal pelvis.  I then advanced the open-ended catheter over the guidewire and into the renal pelvis and injected contrast there.  I saw no filling defects.  I therefore left the guidewire in place and removed the open-ended catheter.  I passed the ureteral access sheath over the guidewire and removed the inner cannula as well as the guidewire.  I then passed the 6-French flexible ureteroscope through the access sheath  and up the ureter. There was an area where it appears the stone had previously been located due to the presence of edema and inflammation of the ureter.  Beyond that, the ureter appeared normal and I then advanced the scope into the kidney.  I found, after inspection of each individual renal papilla through each calix, that he had multiple stones that were adherent to the renal papilla.  These were actually photographed.  I then noted a free-floating stone that apparently had been pushed back up into the kidney from the ureter.  I was able to grasp this with the  nitinol basket and removed it without difficulty.  A second stone was noted free- floating and, as all the other stones were noted to be adherent to the renal papilla, this was felt to require treatment.  I first tried to grasp it and extract it with the nitinol basket, but it was too large. This may have been, actually, the stone that was in the ureter, but either way I elected to fragment it with the laser.  The 200 micron laser fiber was passed through the flexible scope and I fragmented the stone completely.  Once the stone was completely fragmented, I then passed the guidewire through the ureteroscope and removed the ureteroscope as well as the access sheath.  The cystoscope was back loaded over the guidewire and the stent was passed over the guidewire into the area of the renal pelvis.  As the guidewire was removed, there was good curl noted in the renal pelvis and the bladder was then drained and the cystoscope removed.  The patient was awakened and taken to the recovery room in stable and satisfactory condition.  He tolerated the procedure well and there were no intraoperative complications.     Laela Deviney C. Vernie Ammons, M.D.     MCO/MEDQ  D:  11/28/2010  T:  11/28/2010  Job:  [id number]  Electronically Signed by Ihor Gully M.D. on 12/03/2010 04:21:14 AM

## 2010-12-04 NOTE — Op Note (Signed)
  NAME:  Patrick [name], FESTER NO.:  [id number]  MEDICAL RECORD NO.:  [id number]           PATIENT TYPE:  LOCATION:                                 FACILITY:  PHYSICIAN:  Angelia Mould. Derrell Lolling, M.D.DATE OF BIRTH:  1961-06-08  DATE OF PROCEDURE:  11/25/2010 DATE OF DISCHARGE:                              OPERATIVE REPORT   PREOPERATIVE DIAGNOSES: 1. Diffuse lymphadenopathy. 2. 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 3. Kidney stones.  POSTOPERATIVE DIAGNOSES: 1. Diffuse lymphadenopathy. 2. 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 3. Kidney stones.  OPERATION PERFORMED:  Excisional biopsy of 2 deep left axillary lymph nodes.  SURGEON:  Angelia Mould. Derrell Lolling, MD  OPERATIVE INDICATIONS:  This is a 50 year old HIV positive man admitted about 1 week ago with a fever of unknown origin, unresolved, upper respiratory infection, fever of 103, renal insufficiency andhypertension.  Among his workup findings were physical exam and CT findings, the adenopathy in the neck, axilla, and the inguinal areas. Dr. Daiva Eves asked [place] to consider lymph node biopsy for both microbiology and pathologic evaluation.  This seemed very reasonable considering the possibility of lymphoma as well as the fevers.  He has been counseled 2 days ago regarding this and he was brought to the operating room semi electively.  OPERATIVE TECHNIQUE:  Following the induction of a general LMA anesthetic, a surgical time-out was held and identifying correct patient, correct procedure and correct site.  The left axilla was then prepped and draped in sterile fashion.  Intravenous antibiotics were given.  A 1% Xylocaine with epinephrine was used as a local infiltration anesthetic.  Transverse incision was made at the left axilla just about at the hairline.  Dissection was carried down through the subcutaneous tissue.  We incised the clavipectoral fascia and we were  able to palpate 2 large lymph nodes that were closely adherent to each other.  We slowly dissected these out with electrocautery.  Some of the larger attachments were isolated, clamped, divided and ligated with 3-0 Vicryl ties.  Once these lymph nodes were removed, they were sent fresh to pathology.  We called the pathology lab and requested routine culture and sensitivity, AFB, fungal culture as well as routine histology and lymphoma workup. The wound was irrigated with saline.  Hemostasis was excellent.  The deeper tissues were closed with interrupted sutures of 3-0 Vicryl and skin was closed with skin staples.  Clean bandage and ice pack were placed.  The patient was taken to the recovery room in stable condition. Estimated blood loss was about 20-25 mL.  Complications none.  Sponge, needle and instrument counts were correct.     Angelia Mould. Derrell Lolling, M.D.     HMI/MEDQ  D:  11/25/2010  T:  11/26/2010  Job:  [id number]  cc:   Acey Lav, MD Veverly Fells Vernie Ammons, M.D. Carrolyn Meiers, MD  Electronically Signed by Claud Kelp M.D. on 12/03/2010 03:53:49 PM

## 2010-12-05 ENCOUNTER — Telehealth (INDEPENDENT_AMBULATORY_CARE_PROVIDER_SITE_OTHER): Payer: Self-pay | Admitting: *Deleted

## 2010-12-08 NOTE — Discharge Summary (Signed)
NAME:  Patrick [name], Patrick [name] NO.:  [id number]  MEDICAL RECORD NO.:  [id number]           PATIENT TYPE:  I  LOCATION:  5508                         FACILITY:  [facility]  PHYSICIAN:  Blanch Media, M.D.DATE OF BIRTH:  Jun 15, 1961  DATE OF ADMISSION:  11/18/2010 DATE OF DISCHARGE:  12/01/2010                              DISCHARGE SUMMARY   DISCHARGE DIAGNOSES: 1. Acute renal failure secondary to stone obstruction and IV contrast. 2. 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 Human immunodeficiency virus. 4. Hypertension.  DISCHARGE MEDICATIONS: 1. Hydralazine 10 mg p.o. q.i.d. x4 days then 20 mg q.i.d. x1 week,     and the patient is to follow up with his physician for blood     pressure followup. 2. Amlodipine 10 mg p.o. daily. 3. Labetalol 200 mg p.o. b.i.d. 4. Sustiva 600 mg p.o. at bedtime. 5. Lamivudine 150 mg p.o. daily 6. Isentress 400 mg p.o. b.i.d.  DISPOSITION AND FOLLOWUP:  Patrick [name] was discharged from [facility] on December 01, 2010, in stable and improved condition.  He has no fever, nausea, or vomiting.  He will need to continue to take his blood pressure medication, home medication in addition to the hydralazine for better blood pressure control.  He will have a followup appointment with Traci Sermon on December 15, 2010, at 2 p.m.  He will also have an appointment with Dr. Vernie Ammons, Urology on December 11, 2010, at 11 a.m.  He will have a followup appointment with Surgery, Dr. Corliss Skains on December 02, 2010, at 3:10.  By that time, he will need to, 1. Recheck his blood pressure and adjust his medication appropriately. 2. Followup on recheck BMP to make sure that his creatinine has     trended down appropriately as well.  CONSULTATIONS: 1. Urology, Dr. Ihor Gully and Dr. Patsi Sears. 2. Renal with Dr. Arlean Hopping. 3. General Surgery with Dr. Derrell Lolling for lymph node biopsy.  PROCEDURES PERFORMED: 1. Chest x-ray on November 18, 2010, showed no acute  disease. 2. CT of chest with contrast on November 20, 2010, shows bilateral     axillary and mediastinal adenopathy.  Small right pleural effusion.     Small pericardial effusion, new since his prior study.  New     retroperitoneal, bilateral iliac, and bilateral inguinal     adenopathy.  Marked splenomegaly.  Cholelithiasis.  Right     nephrolithiasis. 3. Renal ultrasound November 22, 2010, shows mild-to-moderate right     hydronephrosis.  A right ureter jet was not identified and concern     for an obstructing right urethral stone.  Small amount of ascites.     Splenomegaly.  Right pleural effusion.  Cholelithiasis. 4. CT of abdomen without contrast on November 22, 2010, shows new mild     right hydronephrosis with persistent right urogram showing point of     obstruction in the proximal right ureter.  The etiology of     obstruction is unclear, and could be due to small calculus, which     is obscured by the residual contrast, or soft tissue stranding seen     throughout the retroperitoneal fat, although  this appear unchanged     compared to recent exam.  Stable bilateral inguinal, iliac, and     retroperitoneal lymphadenopathy.  Stable splenomegaly.     Cholelithiasis without evidence of acute cholecystitis. 5. Renal ultrasound on November 23, 2010, shows a stable mild     enlargement hydronephrosis and increased parenchymal echogenicity     of the right kidney.  Stable mild left renal parenchymal scarring.     No evidence of left-sided hydronephrosis.  Splenomegaly and     cholelithiasis are incidentally noted. 6. Renal ultrasound on November 27, 2010, shows stable right     hydronephrosis.  No significant improvement. 7. Chest x-ray on November 27, 2010, shows cardiomegaly and probable     pulmonary vascular congestion with question of small left effusion.  ADMISSION HISTORY:  Patrick [name] is a 50 year old man with past medical history of HIV and history of multiple infections of staph  aureus and blood in urine and an MRSA in his left knee who [facility].  He started having fever about a week ago and one week prior to admission, also had chills and dry cough, but no sweating or runny nose or muscle pain.  He stated that he took temperature and it was about 101-103.  The patient was seen and evaluated by Dr. Orvan Falconer on November 13, 2010, and was given Z-Pak and Tamiflu; however, the symptoms did not improve.  He also noted enlarged lymph node in his neck and axilla with mild tenderness and went to [facility] on the day of admission and he was asked to be admitted to the hospital.  He denies any sick contact, no nausea, vomiting, diarrhea, dysuria, or weight change.  Denies any smoking, ethanol, or drug abuse.  ADMISSION PHYSICAL EXAMINATION:  VITAL SIGNS:  Temperature 99.0, pulse 103, blood pressure 143/70, respirations 20, O2 sats 98% on room air. GENERAL:  No acute distress, pleasant. HEENT:  PERRLA, EOMI, no icterus.  No pallor. NECK:  Supple.  No JVD.  There are enlarged cervical lymph nodes with mild tenderness bilaterally. LUNGS:  Clear to auscultation bilaterally.  No wheezes or crackles appreciated. CARDIOVASCULAR:  Regular rhythm, tachycardia, no murmurs, rubs, or gallops. ABDOMEN:  Soft, nontender, nondistended, normal bowel sounds. EXTREMITIES:  No edema or cyanosis, left ankle rash 5 x 6 cm, bilateral lymphadenopathy under axilla and also inguinal. NEURO:  Alert and oriented x3.  Cranial nerves II through XII grossly intact.  No motor or sensory deficits.  ADMISSION LABS:  Sodium 129, potassium 5.1, chloride 98, bicarb 23, BUN 29, creatinine 1.91, glucose 171.  WBC 4.1, hemoglobin 8.1, hematocrit 24.5, platelets 107, MCV 85.5, T. bili 0.4, alk phos 49, AST 20, ALT 17, protein 6.8, albumin 2.4, calcium 8.4, total cholesterol 101, triglycerides 495, LDL cannot be aspirated.  MRSA negative.  UA shows ketone, protein, hyaline  casts, and granular casts.  Strep pneumo was negative.  Urine creatinine 195.7.  Urine sodium 22.  Urine osmolarity 516.  UDS was negative.  HOSPITAL COURSE: 1. Fever of unknown etiology; however, the patient remained afebrile     throughout hospital course.  This could be from his HIV versus     lymphoma versus bacteremia versus TB versus abscess.  We checked     QuantiFERON gold, EBV panel, syphilis titer, CRP, acute hepatitis     panel, sed rate, CK, ANA, SPEP, which were all negative.  Blood     culture were also negative as well as  well as his urine culture.     The patient was originally started on Rocephin and azithromycin on     the day of admission, however, both of those antibiotics were     stopped on day 2 of hospitalization.  We will consider to get a TEE     if the source of fever cannot be identified and only if the patient     continue to spike fever. 2. Lymphadenopathy, most likely was secondary to HIV related.  The     patient had a lymph node biopsy on November 25, 2010, by Dr.     Claud Kelp for his diffuse lymphadenopathy.  His pathology     shows benign lymph node, no atypical lymph node proliferation or     lymphoma present.  The patient also had cultures for AFB and     fungus, which were also negative.  Tissue culture shows no growth     to date on November 25, 2010.  His cryoglobulins were negative in     addition to histoplasma antigen were also negative.  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  CT scan result shows     retroperitoneal, bilateral iliac, and bilateral inguinal     adenopathy.  Marked splenomegaly and cholelithiasis, and right     nephrolithiasis.  In addition, we held his fluconazole and dapsone     prior to lymph node excisional biopsy for more accurate pathology     report. 3. Acute-on-chronic renal failure with a baseline of 1.3, however,      when the patient presented his creatinine was 1.9.  We hydrated the     patient and his creatinine actually improved to 1.54 with a GFR of     48, therefore the patient received a CT scan with contrast to rule     out lymphoma.  After IV contrast, his creatinine continued to trend     up with a max creatinine level of 4.98 despite IV fluid hydration     boluses and 120 mL/hour of normal saline.  We consulted Urology and     Renal given that his ultrasound shows a right hydronephrosis     nephrosis with renal cell obstruction.  The patient was then taken     to the OR for stent placement and uroscopy by Dr. Ihor Gully on     November 28, 2010, for right renal calculi extraction and right     ureteral stent placement, native lithotripsy, right ureteroscopy,     cystoscopy with right retrograde pyelogram.  After the procedure,     Mr. Abdallah creatinine continued to improve and on day of     discharge has trended down to 2.20.  He is to have followup     appointment with Dr. Vernie Ammons on December 11, 2010, at 11 a.m. 4. HIV was stable.  CD4 on November 18, 2010, was 120 with T helper     cell 12%.  HIV-1 RNA point was 32.  At first, we started him back     on his home medication include Atripla and dapsone; however, we     changed the medication regimen given his renal failure to Sustiva,     lamivudine, and Isentress and the patient continued his current     regimen for the rest of hospitalization without any side effects or  complications. 5. 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  We continued to monitor his     CBC, which was stable.  He did receive 1 unit of transfusion when     his hemoglobin was 6.9 on November 20, 2010, and it has trended up     to 8.2 and he remains between 7.5 to 8.2 throughout hospital     course. 6. Hypertension.  The patient's blood pressure continued to fluctuate     between 140s-160s systolic blood pressure.  The patient was placed     on labetalol 200 mg p.o.  b.i.d. and Norvasc 10 mg p.o. daily.  On     discharge, his blood pressure is 162/85, therefore we started him     on hydralazine 10 mg p.o. q.i.d. x4 days and 20 mg p.o. q.i.d. x1     week and he is supposed to follow up with Traci Sermon with ID     Clinic for a blood pressure recheck.  DISCHARGE VITALS:  Temperature 98.3, blood pressure 162/85, on repeat 142/80, pulse 76, respiration 18, O2 sat 96% on room air.  DISCHARGE LABS:  Sodium 140, potassium 4.7, chloride 108, bicarb 28, BUN 20, creatinine 2.20, glucose 108, calcium 9.1.  CBC; WBC 5.8, hemoglobin 8.5, hematocrit 27.0, platelets 220.    ______________________________ Carrolyn Meiers, MD   ______________________________ Blanch Media, M.D.    MH/MEDQ  D:  12/01/2010  T:  12/02/2010  Job:  [id number]  cc:   Angelia Mould. Derrell Lolling, M.D. Maree Krabbe, M.D. Veverly Fells. Vernie Ammons, M.D.  Electronically Signed by Carrolyn Meiers MD on 12/04/2010 07:43:13 PM Electronically Signed by Blanch Media M.D. on 12/08/2010 03:26:13 PM

## 2010-12-09 NOTE — Progress Notes (Signed)
Summary: Pt. to resume taking Dapsone, d/c fluconazole per Dr. Orvan Falconer  Phone Note Call from Patient Call back at Home Phone [phone]   Caller: Patient Call For: Cliffton Asters MD Reason for Call: Talk to Nurse Summary of Call: Pt. recently in the hospital.  Was discharged and was not continued on 2 of his previous drugs, Dapsone and Fluconazole.  Wanting to know whether he should resume them.  RN paged Dr. Orvan Falconer.  Pt. to resume Dapsone and discontinue fluconazole.  Message left for pt. re these new orders. Jennet Maduro RN  December 05, 2010 12:35 PM

## 2010-12-09 NOTE — Miscellaneous (Signed)
Summary: [facility]RT updated due to renal problems  Clinical Lists Changes  Medications: Removed medication of ATRIPLA 600-200-300 MG TABS (EFAVIRENZ-EMTRICITAB-TENOFOVIR) Take 1 tablet by mouth once a day Added new medication of EPIVIR 150 MG TABS (LAMIVUDINE) Take 1 tablet by mouth once a day - Signed Added new medication of SUSTIVA 600 MG TABS (EFAVIRENZ) Take 1 tablet by mouth once a day - Signed Added new medication of ISENTRESS 400 MG TABS (RALTEGRAVIR POTASSIUM) Take 1 tablet by mouth two times a day - Signed Rx of EPIVIR 150 MG TABS (LAMIVUDINE) Take 1 tablet by mouth once a day;  #30 x 6;  Signed;  Entered by: Jennet Maduro RN;  Authorized by: Cliffton Asters MD;  Method used: Electronically to [facility] [phone]*, [address] [place], [place]  [address], Ph: [phone], Fax:  Rx of SUSTIVA 600 MG TABS (EFAVIRENZ) Take 1 tablet by mouth once a day;  #30 x 6;  Signed;  Entered by: Jennet Maduro RN;  Authorized by: Cliffton Asters MD;  Method used: Electronically to [facility] [phone]*, [address] [place], [place]  [address], Ph: [phone], Fax:  Rx of ISENTRESS 400 MG TABS (RALTEGRAVIR POTASSIUM) Take 1 tablet by mouth two times a day;  #60 x 6;  Signed;  Entered by: Jennet Maduro RN;  Authorized by: Cliffton Asters MD;  Method used: Electronically to [facility] [phone]*, [address] [place], [place]  [address], Ph: [phone], Fax:    Prescriptions: ISENTRESS 400 MG TABS (RALTEGRAVIR POTASSIUM) Take 1 tablet by mouth two times a day  #60 x 6   Entered by:   Jennet Maduro RN   Authorized by:   Cliffton Asters MD   Signed by:   Jennet Maduro RN on 12/01/2010   Method used:   Electronically to        [facility] [phone]* (retail)       [address]       [place], [place]  [address]       Ph: [phone]       Fax:    RxID:   [id number] SUSTIVA 600 MG TABS (EFAVIRENZ) Take 1 tablet by mouth once a day  #30 x 6   Entered by:   Jennet Maduro RN   Authorized by:   Cliffton Asters MD   Signed by:    Jennet Maduro RN on 12/01/2010   Method used:   Electronically to        [facility] [phone]* (retail)       [address]       [place], [place]  [address]       Ph: [phone]       Fax:    RxID:   [id number] EPIVIR 150 MG TABS (LAMIVUDINE) Take 1 tablet by mouth once a day  #30 x 6   Entered by:   Jennet Maduro RN   Authorized by:   Cliffton Asters MD   Signed by:   Jennet Maduro RN on 12/01/2010   Method used:   Electronically to        [facility] [phone]* (retail)       [address]       [place], [place]  [address]       Ph: [phone]       Fax:    RxID:   [id number]

## 2010-12-09 NOTE — Discharge Summary (Signed)
Summary: Hospital Discharge Update    Hospital Discharge Update:  Date of Admission: 11/18/2010 Date of Discharge: 12/01/2010  Brief Summary:  Admitted for fever of unknown etiology and diffused lymphadenopathy likely from IRIS.  Patient remained afebrile throughout hospital course.  Had CT of abd/pelvis which showed diffused lymphadenopathy.  As a result of IV contrast, his Cr continued to trend up to 4.9.  Renal and urology consulted and patient was taken to OR for lithotripsy and cystoscopy and ureteroscopy with stent and right nephrolithiasis removal.  We continued hydration and his Cr improved to 2.2 on discharge.  Patient also had lymph node of left axilla biopsied by Dr. Derrell Lolling and pathology showed benigned lymph node likely HIV related, no proliferative or lymphoma seen.  Culture of lymph node for AFB and fungus were negative.  Blood culture x 2 on 2/21 were also negative.   Labs needed at follow-up: CBC with differential, Basic metabolic panel  Other follow-up issues:  please check his BP and adjust medication as appropriate because patient continued to have persistent elevated BP despite being on Norvasc and Labetalol.  Upon discharge, we started him on Hydralazine and he is to follow up in 2 weeks for BP check.  Medication list changes:  Removed medication of DIFLUCAN 100 MG TABS (FLUCONAZOLE) Take 1 tablet by mouth once a day Removed medication of TAMIFLU 75 MG CAPS (OSELTAMIVIR PHOSPHATE) Take 1 capsule by mouth two times a day Removed medication of ZITHROMAX 1 GM PACK (AZITHROMYCIN) Take as directed Removed medication of DAPSONE 100 MG TABS (DAPSONE) Take 1 tablet by mouth once a day Added new medication of HYDRALAZINE HCL 10 MG TABS (HYDRALAZINE HCL) take 1 tablet four times daily x 4 days then take 2 tablets four times daily x 1 week, then follow up with your doctor - Signed Added new medication of NORVASC 10 MG TABS (AMLODIPINE BESYLATE) take one tablet by mouth once daily -  Signed Rx of HYDRALAZINE HCL 10 MG TABS (HYDRALAZINE HCL) take 1 tablet four times daily x 4 days then take 2 tablets four times daily x 1 week, then follow up with your doctor;  #90 x 1;  Signed;  Entered by: Rosana Berger MD;  Authorized by: Rosana Berger MD;  Method used: Electronically to [facility]. #[id number]*, [address], [place], [place], [place]  [address], Ph: [phone], Fax: [phone] Rx of NORVASC 10 MG TABS (AMLODIPINE BESYLATE) take one tablet by mouth once daily;  #30 x 3;  Signed;  Entered by: Rosana Berger MD;  Authorized by: Rosana Berger MD;  Method used: Electronically to [facility]. #[id number]*, [address], [place], [place], [place]  [address], Ph: [phone], Fax: [phone]  The medication, problem, and allergy lists have been updated.  Please see the dictated discharge summary for details.  Discharge medications:  LABETALOL HCL 200 MG TABS (LABETALOL HCL) Take 1 tablet by mouth two times a day EPIVIR 150 MG TABS (LAMIVUDINE) Take 1 tablet by mouth once a day SUSTIVA 600 MG TABS (EFAVIRENZ) Take 1 tablet by mouth once a day ISENTRESS 400 MG TABS (RALTEGRAVIR POTASSIUM) Take 1 tablet by mouth two times a day HYDRALAZINE HCL 10 MG TABS (HYDRALAZINE HCL) take 1 tablet four times daily x 4 days then take 2 tablets four times daily x 1 week, then follow up with your doctor NORVASC 10 MG TABS (AMLODIPINE BESYLATE) take one tablet by mouth once daily  Other patient instructions:  please follow up with  Dr Vernie Ammons on 12/11/10 at 3:10 please follow up with Dr Corliss Skains for staple removal on 12/02/10 at 8:30 Please follow up with PA Traci Sermon on 3/19 at 2:15

## 2010-12-11 LAB — CBC
Hemoglobin: 10.9 g/dL — ABNORMAL LOW (ref 13.0–17.0)
MCH: 30.6 pg (ref 26.0–34.0)
MCH: 31.1 pg (ref 26.0–34.0)
MCV: 94.1 fL (ref 78.0–100.0)
MCV: 96 fL (ref 78.0–100.0)
Platelets: 142 10*3/uL — ABNORMAL LOW (ref 150–400)
RBC: 3.51 MIL/uL — ABNORMAL LOW (ref 4.22–5.81)
RDW: 13.5 % (ref 11.5–15.5)
WBC: 8.8 10*3/uL (ref 4.0–10.5)

## 2010-12-11 LAB — BASIC METABOLIC PANEL
BUN: 7 mg/dL (ref 6–23)
CO2: 27 mEq/L (ref 19–32)
Chloride: 107 mEq/L (ref 96–112)
Chloride: 108 mEq/L (ref 96–112)
Creatinine, Ser: 1.48 mg/dL (ref 0.4–1.5)
GFR calc Af Amer: >60 mL/min (ref >60)
Sodium: 140 mEq/L (ref 135–145)

## 2010-12-11 LAB — T-HELPER CELL (CD4) - (RCID CLINIC ONLY): CD4 % Helper T Cell: 12 % — ABNORMAL LOW (ref 33–55)

## 2010-12-12 LAB — LIPID PANEL
Cholesterol: 155 mg/dL (ref 0–200)
LDL Cholesterol: 103 mg/dL — ABNORMAL HIGH (ref 0–99)
Total CHOL/HDL Ratio: 6.2 RATIO
VLDL: 27 mg/dL (ref 0–40)

## 2010-12-12 LAB — CBC
HCT: 34.1 % — ABNORMAL LOW (ref 39.0–52.0)
HCT: 37 % — ABNORMAL LOW (ref 39.0–52.0)
Hemoglobin: 12.4 g/dL — ABNORMAL LOW (ref 13.0–17.0)
MCH: 31.1 pg (ref 26.0–34.0)
MCH: 31.3 pg (ref 26.0–34.0)
MCHC: 32.6 g/dL (ref 30.0–36.0)
MCHC: 33.5 g/dL (ref 30.0–36.0)
RDW: 13.4 % (ref 11.5–15.5)

## 2010-12-12 LAB — DIFFERENTIAL
Basophils Absolute: 0 10*3/uL (ref 0.0–0.1)
Basophils Relative: 0 % (ref 0–1)
Basophils Relative: 0 % (ref 0–1)
Eosinophils Absolute: 0 10*3/uL (ref 0.0–0.7)
Eosinophils Absolute: 0.1 10*3/uL (ref 0.0–0.7)
Eosinophils Relative: 1 % (ref 0–5)
Monocytes Absolute: 0.8 10*3/uL (ref 0.1–1.0)
Monocytes Absolute: 0.9 10*3/uL (ref 0.1–1.0)
Monocytes Relative: 8 % (ref 3–12)
Neutro Abs: 6.7 10*3/uL (ref 1.7–7.7)
Neutro Abs: 9.4 10*3/uL — ABNORMAL HIGH (ref 1.7–7.7)

## 2010-12-12 LAB — BASIC METABOLIC PANEL
BUN: 9 mg/dL (ref 6–23)
CO2: 25 mEq/L (ref 19–32)
GFR calc non Af Amer: 51 mL/min — ABNORMAL LOW (ref >60)
Glucose, Bld: 203 mg/dL — ABNORMAL HIGH (ref 70–99)
Potassium: 4.3 mEq/L (ref 3.5–5.1)

## 2010-12-12 LAB — RETICULOCYTES
RBC.: 3.59 MIL/uL — ABNORMAL LOW (ref 4.22–5.81)
Retic Ct Pct: 1.5 % (ref 0.4–3.1)

## 2010-12-12 LAB — IRON AND TIBC
Iron: 16 ug/dL — ABNORMAL LOW (ref 42–135)
Saturation Ratios: 7 % — ABNORMAL LOW (ref 20–55)
TIBC: 241 ug/dL (ref 215–435)
UIBC: 225 ug/dL

## 2010-12-12 LAB — VITAMIN B12: Vitamin B-12: 186 pg/mL — ABNORMAL LOW (ref 211–911)

## 2010-12-12 LAB — CULTURE, BLOOD (ROUTINE X 2)
Culture: NO GROWTH
Culture: NO GROWTH

## 2010-12-12 LAB — FOLATE: Folate: 5.1 ng/mL

## 2010-12-12 LAB — FERRITIN: Ferritin: 229 ng/mL (ref 22–322)

## 2010-12-12 LAB — HEPATIC FUNCTION PANEL
ALT: 10 U/L (ref 0–53)
AST: 13 U/L (ref 0–37)
Bilirubin, Direct: 0.2 mg/dL (ref 0.0–0.3)
Indirect Bilirubin: 0.6 mg/dL (ref 0.3–0.9)
Total Protein: 7 g/dL (ref 6.0–8.3)

## 2010-12-12 LAB — T-HELPER CELLS (CD4) COUNT (NOT AT ARMC): CD4 T Cell Abs: 140 uL — ABNORMAL LOW (ref 400–2700)

## 2010-12-12 LAB — HEMOGLOBIN A1C: Mean Plasma Glucose: 108 mg/dL (ref <117)

## 2010-12-15 ENCOUNTER — Encounter: Payer: Self-pay | Admitting: Adult Health

## 2010-12-15 ENCOUNTER — Ambulatory Visit (INDEPENDENT_AMBULATORY_CARE_PROVIDER_SITE_OTHER): Payer: Medicaid Other | Admitting: Adult Health

## 2010-12-15 ENCOUNTER — Ambulatory Visit: Payer: Medicaid Other | Admitting: Adult Health

## 2010-12-15 DIAGNOSIS — N19 Unspecified kidney failure: Secondary | ICD-10-CM

## 2010-12-15 DIAGNOSIS — B20 Human immunodeficiency virus [HIV] disease: Secondary | ICD-10-CM

## 2010-12-15 LAB — T-HELPER CELL (CD4) - (RCID CLINIC ONLY): CD4 % Helper T Cell: 10 % — ABNORMAL LOW (ref 33–55)

## 2010-12-16 LAB — CONVERTED CEMR LAB
ALT: 8 units/L (ref 0–53)
AST: 13 units/L (ref 0–37)
Alkaline Phosphatase: 67 units/L (ref 39–117)
Basophils Relative: 0 % (ref 0–1)
CO2: 24 meq/L (ref 19–32)
Creatinine, Ser: 1.7 mg/dL — ABNORMAL HIGH (ref 0.40–1.50)
Eosinophils Absolute: 0.1 10*3/uL (ref 0.0–0.7)
Eosinophils Relative: 2 % (ref 0–5)
HCT: 30.7 % — ABNORMAL LOW (ref 39.0–52.0)
Lymphs Abs: 1.7 10*3/uL (ref 0.7–4.0)
MCHC: 31.9 g/dL (ref 30.0–36.0)
MCV: 90.6 fL (ref 78.0–100.0)
Neutrophils Relative %: 60 % (ref 43–77)
Platelets: 142 10*3/uL — ABNORMAL LOW (ref 150–400)
RDW: 19.3 % — ABNORMAL HIGH (ref 11.5–15.5)
Sodium: 135 meq/L (ref 135–145)
Total Bilirubin: 0.9 mg/dL (ref 0.3–1.2)
Total Protein: 7.6 g/dL (ref 6.0–8.3)
WBC: 6.1 10*3/uL (ref 4.0–10.5)

## 2010-12-16 LAB — T-HELPER CELL (CD4) - (RCID CLINIC ONLY)
CD4 % Helper T Cell: 12 % — ABNORMAL LOW (ref 33–55)
CD4 T Cell Abs: 140 uL — ABNORMAL LOW (ref 400–2700)

## 2010-12-22 LAB — T-HELPER CELL (CD4) - (RCID CLINIC ONLY)
CD4 % Helper T Cell: 12 % — ABNORMAL LOW (ref 33–55)
CD4 T Cell Abs: 160 uL — ABNORMAL LOW (ref 400–2700)

## 2010-12-24 ENCOUNTER — Ambulatory Visit: Payer: Medicaid Other

## 2010-12-24 ENCOUNTER — Other Ambulatory Visit: Payer: Self-pay

## 2010-12-24 LAB — FUNGUS CULTURE W SMEAR

## 2010-12-25 ENCOUNTER — Inpatient Hospital Stay (HOSPITAL_COMMUNITY)
Admission: AD | Admit: 2010-12-25 | Discharge: 2010-12-26 | DRG: 977 | Disposition: A | Discharge status: Home / Self Care | Payer: Medicaid Other | Source: Ambulatory Visit | Attending: Internal Medicine | Admitting: Internal Medicine

## 2010-12-25 ENCOUNTER — Inpatient Hospital Stay (HOSPITAL_COMMUNITY): Payer: Medicaid Other

## 2010-12-25 ENCOUNTER — Encounter: Payer: Self-pay | Admitting: Adult Health

## 2010-12-25 ENCOUNTER — Ambulatory Visit: Payer: Medicaid Other | Admitting: Adult Health

## 2010-12-25 ENCOUNTER — Encounter: Payer: Self-pay | Admitting: Internal Medicine

## 2010-12-25 VITALS — BP 102/65 | HR 88 | Temp 98.0°F | Ht 72.0 in | Wt 217.0 lb

## 2010-12-25 DIAGNOSIS — D61818 Other pancytopenia: Secondary | ICD-10-CM | POA: Diagnosis present

## 2010-12-25 DIAGNOSIS — N289 Disorder of kidney and ureter, unspecified: Secondary | ICD-10-CM | POA: Diagnosis present

## 2010-12-25 DIAGNOSIS — R05 Cough: Secondary | ICD-10-CM

## 2010-12-25 DIAGNOSIS — I10 Essential (primary) hypertension: Secondary | ICD-10-CM

## 2010-12-25 DIAGNOSIS — R6510 Systemic inflammatory response syndrome (SIRS) of non-infectious origin without acute organ dysfunction: Secondary | ICD-10-CM | POA: Diagnosis present

## 2010-12-25 DIAGNOSIS — B20 Human immunodeficiency virus [HIV] disease: Principal | ICD-10-CM | POA: Diagnosis present

## 2010-12-25 DIAGNOSIS — N1330 Unspecified hydronephrosis: Secondary | ICD-10-CM

## 2010-12-25 DIAGNOSIS — N19 Unspecified kidney failure: Secondary | ICD-10-CM

## 2010-12-25 DIAGNOSIS — Z9189 Other specified personal risk factors, not elsewhere classified: Secondary | ICD-10-CM

## 2010-12-25 DIAGNOSIS — R059 Cough, unspecified: Secondary | ICD-10-CM

## 2010-12-25 LAB — DIFFERENTIAL
Basophils Absolute: 0 10*3/uL (ref 0.0–0.1)
Basophils Relative: 0 % (ref 0–1)
Eosinophils Absolute: 0.1 10*3/uL (ref 0.0–0.7)
Monocytes Relative: 29 % — ABNORMAL HIGH (ref 3–12)
Neutro Abs: 1.2 10*3/uL — ABNORMAL LOW (ref 1.7–7.7)
Neutrophils Relative %: 36 % — ABNORMAL LOW (ref 43–77)

## 2010-12-25 LAB — COMPREHENSIVE METABOLIC PANEL
ALT: 13 U/L (ref 0–53)
AST: 14 U/L (ref 0–37)
Albumin: 2.7 g/dL — ABNORMAL LOW (ref 3.5–5.2)
Calcium: 8.4 mg/dL (ref 8.4–10.5)
Chloride: 103 mEq/L (ref 96–112)
Creatinine, Ser: 1.56 mg/dL — ABNORMAL HIGH (ref 0.4–1.5)
GFR calc Af Amer: 58 mL/min — ABNORMAL LOW (ref >60)
Sodium: 133 mEq/L — ABNORMAL LOW (ref 135–145)

## 2010-12-25 LAB — CBC
MCH: 30.2 pg (ref 26.0–34.0)
MCHC: 33.8 g/dL (ref 30.0–36.0)
Platelets: 85 10*3/uL — ABNORMAL LOW (ref 150–400)
RBC: 2.55 MIL/uL — ABNORMAL LOW (ref 4.22–5.81)

## 2010-12-25 LAB — URINALYSIS, MICROSCOPIC ONLY
Bilirubin Urine: NEGATIVE
Hgb urine dipstick: NEGATIVE
Specific Gravity, Urine: 1.019 (ref 1.005–1.030)
pH: 6 (ref 5.0–8.0)

## 2010-12-25 LAB — MRSA PCR SCREENING: MRSA by PCR: NEGATIVE

## 2010-12-25 NOTE — H&P (Signed)
Hospital Admission Note Date: 12/25/2010  Patient name: Patrick [name] Medical record number: [id number] Date of birth: 1961/08/09 Age: 50 y.o. Gender: male PCP: No primary provider on file.   Attending physician:  Dr. Coralee Pesa  First contact: Resident (R1): Dr. Loistine Chance [phone] Second contact: Resident (R2/R3): Dr. Arvilla Market  [phone]  Chief Complaint: fever, cough,   History of Present Illness:  Pt is a 50 y/o M with PMH of HIV/AIDS (CD4 120, VL 33 as of 10/2010) and recent hospitalization from 2-21 - 12/01/2010 for FUO and ARF who presents today as a direct admission from the [facility] for complaint of fever, malaise, problems with blood pressure control and persistent cough.  Pt reports  night sweats and intermittent fevers up to 102F as well as significant fatigue following hospital discharge.  He believes his fatigue is partially related to changes made to  anti-hypertensive regimen during his recent hospital stay and provides a record of home BP readings with SBP from 97-130s. He further mentioned decreased appetite since one week and decreased po intake.   He describes his cough as dry and non-productive and states that this was present for the past month and seemed to be improving after his recent hospitalization but is now worsening.  Denies any sputum production, sore throat, nasal drainage, or hemoptysis, admits to sinus congestion. He noted a rash on his left foot which has been persistent for a very long time. It improves and sometimes it gets worse but never disappears.    Denies any sick contacts.  Denies weight loss; reports he has gained some weight back since he's been home.  Denies chest pain, syncope, palpitations, n/v/d, abdominal pain, h/a, or other complaint.   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 Bacteremia of STAPHYLOCOCCUS AUREUS in 11/2008 and 12/2008 Left kneee infection of MRSA 06/2007 Status post appendectomy in 2001.  kidney stones s/p lithotripsy in 1999   MEDICATIONS: ATRIPLA 600-200-300 MG TABS (EFAVIRENZ-EMTRICITAB-TENOFOVIR) Take 1 tablet by mouth once a day DAPSONE 100 MG TABS (DAPSONE) Take 1 tablet by mouth once a day LABETALOL HCL 200 MG TABS (LABETALOL HCL) Take 1 tablet by mouth two times a day DIFLUCAN 100 MG TABS (FLUCONAZOLE) Take 1 tablet by mouth once a day TAMIFLU 75 MG CAPS (OSELTAMIVIR PHOSPHATE) Take 1 capsule by mouth two times a day ZITHROMAX 1 GM PACK (AZITHROMYCIN) Take as directed   SOCIAL HISTORY: The patient does not smoke cigarettes or drink alcohol. He was in the sales business.   FAMILY HISTORY: Both parents are deceased.  No family history of diabetes mellitus or CAD.     Review of Systems: 12 point ROS completed and as per HPI, all other systems reviewed and negative  VS: T: 99.9, HR 90, BP 107/84, RR 14, O2: 100% RA  Physical Exam: GEN: NAD  A&Ox3 HEENT: PERRL, EOMI, no icterus, no pallor NECK: supple, no JVD, there are enlarged cervical LN with mild tenderness. LUNGS: clear to auscultation bilaterally, no wheezing/crackles.  CVS: RR, tachycardia, no murmur/rubs/gallops ABD: Soft, non-tender/distension, normal bowel sounds. EXTREMITIES: No edema or cyanosis, but left ankle a 5x6 cm confluent raised, scaly erythematous lesion, no exudate NEURO:  Alert &Oriented x3, no focal motor deficit.  Decreased sensation to light touch on bilateral 1st ans 2nd toes.   Lab results: non available  Imaging results: none avilable   Assessment & Plan by Problem:  1. Fever: unknown etiology.  Pt  recently had an extensive workup during his recent hospitalization including   QuantiFERON gold, EBV panel, syphilis titer, CRP, acute hepatitis  panel, sed rate, CK, ANA, SPEP, which were all negative.  Blood cultures and urine cultures were without growth.   THe pt also underwent bx of a deep, left axillary LN to investigate his diffuse LAD and fever; bx was negative.  His fevers may be related to his HIV disease although his is at risk for HAP.  Endocarditis is also a possibility, although this seems slightly less likely in the setting of negative blood cx but still possible (i.e possible cx neg endocarditis); will  consider TEE for complete work up of his FUO.    - Admit to regular bed - Repeat bcx, u/a and urine analysis cx - 2 view CXR to eval for PNA - WIll not initiate abx at this time as there is no clear source of bacterial infection and pt is hemodynamically stable - Discuss TEE to eval for endocarditis  2. Hypotension: pts blood pressure is slightly low with SBPs from 98-120 during the exam - WIll hold antihypertensives - Gentle hydration in setting of CRF with NS bolus  - Continue to monitor  3. HIV - continue ART - will not repeat CD4 or VL at this time  4. Chronic renal failure - Will check BMET  5. VTW ppx: lovenox

## 2010-12-25 NOTE — Progress Notes (Signed)
  Subjective:    Patient ID: Patrick [name], male    DOB: Jul 29, 1961, 50 y.o.   MRN: [id number]  HPI  3-day hx of high-spiking fevers with dry non-productivde cough.  SOB, DOE, non-exertional; fatigue.  Recent discharge from [facility] s/p ARF and hypertension.  (+) night sweats & chills.  No sputum production  Review of Systems  Constitutional: Positive for fever, chills, diaphoresis, activity change, appetite change and fatigue. Negative for unexpected weight change.  HENT: Negative for hearing loss, ear pain, nosebleeds, congestion, facial swelling, rhinorrhea, sneezing, neck pain, neck stiffness, postnasal drip, tinnitus and ear discharge.   Eyes: Negative for photophobia, pain, discharge, redness, itching and visual disturbance.  Respiratory: Positive for cough and shortness of breath. Negative for apnea, choking, chest tightness, wheezing and stridor.   Cardiovascular: Negative for chest pain, palpitations and leg swelling.  Gastrointestinal: Negative for nausea, vomiting, abdominal pain, diarrhea, constipation, blood in stool, abdominal distention, anal bleeding and rectal pain.  Genitourinary: Negative.   Musculoskeletal: Negative for myalgias, back pain, joint swelling, arthralgias and gait problem.  Skin: Positive for pallor. Negative for color change, rash and wound.  Neurological: Negative for dizziness, tremors, seizures, syncope, facial asymmetry, speech difficulty, weakness, light-headedness, numbness and headaches.  Hematological: Positive for adenopathy. Does not bruise/bleed easily.  Psychiatric/Behavioral: Negative for suicidal ideas, hallucinations, behavioral problems, confusion, sleep disturbance, self-injury, dysphoric mood, decreased concentration and agitation. The patient is not nervous/anxious and is not hyperactive.        Objective:   Physical Exam  Constitutional: He is oriented to person, place, and time. He appears well-developed and well-nourished. He appears  distressed.  HENT:  Head: Normocephalic and atraumatic.  Right Ear: External ear normal.  Left Ear: External ear normal.  Nose: Nose normal.  Mouth/Throat: Oropharynx is clear and moist. No oropharyngeal exudate.  Eyes: Conjunctivae and EOM are normal. Pupils are equal, round, and reactive to light. Right eye exhibits no discharge. Left eye exhibits no discharge. No scleral icterus.  Neck: Normal range of motion. Neck supple. No JVD present. No tracheal deviation present. No thyromegaly present.  Cardiovascular: Normal rate, regular rhythm and intact distal pulses.  Exam reveals no gallop and no friction rub.   No murmur heard. Pulmonary/Chest: Breath sounds normal. No stridor. No respiratory distress. He has no wheezes. He has no rales. He exhibits no tenderness.       Tachypnic  Musculoskeletal: Normal range of motion. He exhibits no edema and no tenderness.  Lymphadenopathy:    He has no cervical adenopathy.  Neurological: He is alert and oriented to person, place, and time. No cranial nerve deficit. He exhibits normal muscle tone. Coordination normal.  Skin: Skin is warm and dry. No rash noted. He is not diaphoretic. No erythema. There is pallor.          Assessment & Plan:  Cough with fever:  Appears acutely ill.  Given recent hospitlalization early hospital-acquired pneumonia must be considered in spite of near normal lung exam.  Plan for re-admit to w/u fever and cough.  Hypertension:  Good response with half-dose hydralazine.  Should consider reducing dose further to 25 mg bid.

## 2010-12-25 NOTE — Assessment & Plan Note (Signed)
Summary: HFU/VS   Vital Signs:  Patient profile:   50 year old male Height:      72 inches Weight:      216.8 pounds BMI:     29.51 Temp:     98.4 degrees F oral Pulse rate:   82 / minute BP sitting:   92 / 34  (right arm)  Vitals Entered By: Alesia Morin CMA (December 15, 2010 2:03 PM) CC: follow-up visit to hospital visit. Patients BP has been running low he feels "out of sorts" and lightheaded upon standing or sitting. Is Patient Diabetic? No Pain Assessment Patient in pain? no      Nutritional Status BMI of 25 - 29 = overweight Nutritional Status Detail appetite "ok"  Have you ever been in a relationship where you felt threatened, hurt or afraid?No   Does patient need assistance? Functional Status Self care Ambulation Normal Comments no missed meds   Primary Provider:  Cliffton Asters MD  CC:  follow-up visit to hospital visit. Patients BP has been running low he feels "out of sorts" and lightheaded upon standing or sitting.Marland Kitchen  History of Present Illness: Recent discharge from [facility] s/p ARF, LAD secondary to IRIS, and hypertensive crisis.  Since discharge has been having episodic hypotensive episodes while on lobetolol, amlodipine and hydralizine 20mg  qid.  Also c/o of rash on right ankle that is very pruritic.  Preventive Screening-Counseling & Management  Alcohol-Tobacco     Alcohol drinks/day: 1     Alcohol type: all     Smoking Status: never  Caffeine-Diet-Exercise     Caffeine use/day: yes     Does Patient Exercise: yes     Type of exercise: bicycling     Exercise (avg: min/session): 30-60     Times/week: 3  Hep-HIV-STD-Contraception     HIV Risk: risk noted     HIV Risk Counseling: not indicated-no HIV risk noted  Safety-Violence-Falls     Seat Belt Use: yes      Sexual History:  n/a.        Drug Use:  never.        Blood Transfusions:  no.        Travel History:  no.    Comments: pt declined condoms  Allergies: 1)  ! Codeine 2)  !  Sulfa  Social History: Blood Transfusions:  no Travel History:  no  Review of Systems       The patient complains of suspicious skin lesions and enlarged lymph nodes.  The patient denies anorexia, fever, weight loss, weight gain, vision loss, decreased hearing, hoarseness, chest pain, syncope, dyspnea on exertion, peripheral edema, prolonged cough, headaches, hemoptysis, abdominal pain, melena, hematochezia, severe indigestion/heartburn, hematuria, incontinence, genital sores, muscle weakness, transient blindness, difficulty walking, depression, unusual weight change, abnormal bleeding, angioedema, and testicular masses.    Physical Exam  General:  alert, well-developed, well-hydrated, and overweight-appearing.   Head:  normocephalic and atraumatic.   Eyes:  vision grossly intact, pupils equal, pupils round, and pupils reactive to light.   Ears:  R ear normal and L ear normal.   Nose:  no external deformity and no nasal discharge.   Mouth:  pharynx pink and moist and fair dentition.   Neck:  supple, full ROM, and no masses.   Lungs:  normal respiratory effort and normal breath sounds.   Heart:  normal rate, regular rhythm, no murmur, no gallop, and no rub.   Abdomen:  soft, non-tender, and normal bowel sounds.   Msk:  No deformity or scoliosis noted of thoracic or lumbar spine.   Extremities:  No clubbing, cyanosis, edema, or deformity noted with normal full range of motion of all joints.   Neurologic:  alert & oriented X3, cranial nerves II-XII intact, strength normal in all extremities, and gait normal.   Skin:  turgor normal and color normal.  Errythmatous papular rash, lateral skin surface of right heel area.  Some excoriation c/w scratching noted. Cervical Nodes:  R anterior LN enlarged, R posterior LN enlarged, L anterior LN enlarged, and L posterior LN enlarged.   Axillary Nodes:  R axillary LN enlarged and L axillary LN enlarged.   Psych:  Cognition and judgment appear intact.  Alert and cooperative with normal attention span and concentration. No apparent delusions, illusions, hallucinations   Impression & Recommendations:  Problem # 1:  RENAL FAILURE (ICD-586) Attributed to lithiasis and htn.  Need to re-eval this matter to see if convalescing any.  Check CBC and CMP today. Orders: T-CBC w/Diff ([id number]) T-Comprehensive Metabolic Panel ([id number]) Est. Patient Level IV (30865)  Problem # 2:  HYPERTENSION (ICD-401.9) Rebound hypotension, most likely associated with probable recovery from RF.  Need to monitor this a little closer.  Instructed to reduce hydralazine to 10mg  qid initially, and hold if systolic BP is <110 mmHg (has cuff at home).  Return in 1 week for nurse visit to recheck BP.  If BP sustains at <120 mmHg systolically may consider d/c hydralazine and continue beta-blocker therapy and CCB's. His updated medication list for this problem includes:    Labetalol Hcl 200 Mg Tabs (Labetalol hcl) .Marland Kitchen... Take 1 tablet by mouth two times a day    Hydralazine Hcl 10 Mg Tabs (Hydralazine hcl) .Marland Kitchen... Take 1 tablet four times daily x 4 days then take 2 tablets four times daily x 1 week, then follow up with your doctor    Norvasc 10 Mg Tabs (Amlodipine besylate) .Marland Kitchen... Take one tablet by mouth once daily  Problem # 3:  LYMPHADENOPATHY, DIFFUSE (ICD-785.6) Attributed to IRIS.  Will continue to monitor.  currently on Isentress, EFV, and  3TC.  Encouraged to maintain adherence with regimen.  Should repeat labs in 4 weeks to check sustained response.  Instructed to notify clinic if he starts developing severe fever, abd pain, sweats, and chills.  Problem # 4:  TINEA PEDIS (ICD-110.4) Hydrocortisone 1% cream with ketoconazole 2% cream to affected area on right foot two times a day.  Instructed to use "sensitive skin" body wash (frgrance- and dye- free) and use fragrance- and dye-free laundry detergent.  Verbally acknowledged. His updated medication list for this  problem includes:    Ketoconazole 2 % Crea (Ketoconazole) .Marland Kitchen... Apply sparingly to affected area two times a day as directed  Medications Added to Medication List This Visit: 1)  Hydrocortisone 1 % Crea (Hydrocortisone) .... Apply sparingly to affected two times a day as directed. 2)  Ketoconazole 2 % Crea (Ketoconazole) .... Apply sparingly to affected area two times a day as directed Prescriptions: KETOCONAZOLE 2 % CREA (KETOCONAZOLE) Apply sparingly to affected area two times a day as directed  #30 g x 1   Entered and Authorized by:   Talmadge Chad NP   Signed by:   Talmadge Chad NP on 12/15/2010   Method used:   Electronically to        [facility] [address] [phone]* (retail)       [address]       [address]  [place], [place]  [address]       Ph: [phone]       Fax: [phone]   RxID:   [id number] HYDROCORTISONE 1 % CREA (HYDROCORTISONE) Apply sparingly to affected two times a day as directed.  #30 g x 1   Entered and Authorized by:   Talmadge Chad NP   Signed by:   Talmadge Chad NP on 12/15/2010   Method used:   Electronically to        [facility] [phone]* (retail)       [address]       [address]       [place], [place]  [address]       Ph: [phone]       Fax: [phone]   RxID:   [id number]    Orders Added: 1)  T-CBC w/Diff [[id number]] 2)  T-Comprehensive Metabolic Panel [[id number]] 3)  Est. Patient Level IV [31540]

## 2010-12-26 DIAGNOSIS — R509 Fever, unspecified: Secondary | ICD-10-CM

## 2010-12-26 DIAGNOSIS — I959 Hypotension, unspecified: Secondary | ICD-10-CM

## 2010-12-26 LAB — URINE CULTURE

## 2010-12-26 LAB — BASIC METABOLIC PANEL
CO2: 23 mEq/L (ref 19–32)
Calcium: 9 mg/dL (ref 8.4–10.5)
Creatinine, Ser: 1.5 mg/dL (ref 0.4–1.5)
GFR calc Af Amer: >60 mL/min (ref >60)

## 2010-12-26 LAB — CBC
Hemoglobin: 8.4 g/dL — ABNORMAL LOW (ref 13.0–17.0)
MCH: 29.9 pg (ref 26.0–34.0)
MCHC: 33.3 g/dL (ref 30.0–36.0)
Platelets: 83 10*3/uL — ABNORMAL LOW (ref 150–400)
RDW: 18.1 % — ABNORMAL HIGH (ref 11.5–15.5)

## 2011-01-01 LAB — CULTURE, BLOOD (ROUTINE X 2)
Culture  Setup Time: 201203300208
Culture: NO GROWTH

## 2011-01-02 LAB — T-HELPER CELL (CD4) - (RCID CLINIC ONLY)
CD4 % Helper T Cell: 9 % — ABNORMAL LOW (ref 33–55)
CD4 T Cell Abs: 70 uL — ABNORMAL LOW (ref 400–2700)

## 2011-01-06 LAB — BASIC METABOLIC PANEL
BUN: 12 mg/dL (ref 6–23)
CO2: 23 mEq/L (ref 19–32)
Calcium: 9.5 mg/dL (ref 8.4–10.5)
Chloride: 106 mEq/L (ref 96–112)
Creatinine, Ser: 1.3 mg/dL (ref 0.4–1.5)
GFR calc Af Amer: >60 mL/min (ref >60)
GFR calc non Af Amer: 59 mL/min — ABNORMAL LOW (ref >60)
Glucose, Bld: 109 mg/dL — ABNORMAL HIGH (ref 70–99)
Potassium: 4.2 mEq/L (ref 3.5–5.1)
Sodium: 137 mEq/L (ref 135–145)

## 2011-01-06 LAB — T-HELPER CELL (CD4) - (RCID CLINIC ONLY): CD4 % Helper T Cell: 7 % — ABNORMAL LOW (ref 33–55)

## 2011-01-06 LAB — HEMOGLOBIN AND HEMATOCRIT, BLOOD
HCT: 32.4 % — ABNORMAL LOW (ref 39.0–52.0)
Hemoglobin: 11.1 g/dL — ABNORMAL LOW (ref 13.0–17.0)

## 2011-01-07 LAB — COMPREHENSIVE METABOLIC PANEL
ALT: 11 U/L (ref 0–53)
ALT: 16 U/L (ref 0–53)
AST: 14 U/L (ref 0–37)
AST: 20 U/L (ref 0–37)
Albumin: 3.2 g/dL — ABNORMAL LOW (ref 3.5–5.2)
Alkaline Phosphatase: 50 U/L (ref 39–117)
Alkaline Phosphatase: 58 U/L (ref 39–117)
BUN: 18 mg/dL (ref 6–23)
BUN: 24 mg/dL — ABNORMAL HIGH (ref 6–23)
Calcium: 8.3 mg/dL — ABNORMAL LOW (ref 8.4–10.5)
Chloride: 102 mEq/L (ref 96–112)
GFR calc Af Amer: 37 mL/min — ABNORMAL LOW (ref >60)
GFR calc Af Amer: >60 mL/min (ref >60)
Glucose, Bld: 111 mg/dL — ABNORMAL HIGH (ref 70–99)
Potassium: 3.8 mEq/L (ref 3.5–5.1)
Potassium: 4.6 mEq/L (ref 3.5–5.1)
Sodium: 134 mEq/L — ABNORMAL LOW (ref 135–145)
Sodium: 135 mEq/L (ref 135–145)
Sodium: 136 mEq/L (ref 135–145)
Total Bilirubin: 1.3 mg/dL — ABNORMAL HIGH (ref 0.3–1.2)
Total Protein: 6.5 g/dL (ref 6.0–8.3)
Total Protein: 7.2 g/dL (ref 6.0–8.3)
Total Protein: 8 g/dL (ref 6.0–8.3)

## 2011-01-07 LAB — CBC
HCT: 22.6 % — ABNORMAL LOW (ref 39.0–52.0)
HCT: 26 % — ABNORMAL LOW (ref 39.0–52.0)
HCT: 29.2 % — ABNORMAL LOW (ref 39.0–52.0)
Hemoglobin: 10 g/dL — ABNORMAL LOW (ref 13.0–17.0)
MCHC: 34.3 g/dL (ref 30.0–36.0)
MCV: 95.9 fL (ref 78.0–100.0)
MCV: 96.1 fL (ref 78.0–100.0)
Platelets: 120 10*3/uL — ABNORMAL LOW (ref 150–400)
Platelets: 145 10*3/uL — ABNORMAL LOW (ref 150–400)
RBC: 2.74 MIL/uL — ABNORMAL LOW (ref 4.22–5.81)
RBC: 3.02 MIL/uL — ABNORMAL LOW (ref 4.22–5.81)
RDW: 17.5 % — ABNORMAL HIGH (ref 11.5–15.5)
RDW: 17.8 % — ABNORMAL HIGH (ref 11.5–15.5)
RDW: 17.9 % — ABNORMAL HIGH (ref 11.5–15.5)
RDW: 18 % — ABNORMAL HIGH (ref 11.5–15.5)
WBC: 3.6 10*3/uL — ABNORMAL LOW (ref 4.0–10.5)
WBC: 9.4 10*3/uL (ref 4.0–10.5)

## 2011-01-07 LAB — POCT URINALYSIS DIP (DEVICE)
Glucose, UA: NEGATIVE mg/dL
Nitrite: POSITIVE — AB

## 2011-01-07 LAB — BASIC METABOLIC PANEL
BUN: 17 mg/dL (ref 6–23)
Chloride: 106 mEq/L (ref 96–112)
GFR calc non Af Amer: 44 mL/min — ABNORMAL LOW (ref >60)
Glucose, Bld: 126 mg/dL — ABNORMAL HIGH (ref 70–99)
Potassium: 3.8 mEq/L (ref 3.5–5.1)

## 2011-01-07 LAB — CULTURE, BLOOD (ROUTINE X 2): Culture: NO GROWTH

## 2011-01-07 LAB — DIFFERENTIAL
Basophils Absolute: 0 10*3/uL (ref 0.0–0.1)
Basophils Absolute: 0 10*3/uL (ref 0.0–0.1)
Basophils Relative: 0 % (ref 0–1)
Basophils Relative: 0 % (ref 0–1)
Eosinophils Absolute: 0 10*3/uL (ref 0.0–0.7)
Eosinophils Absolute: 0.1 10*3/uL (ref 0.0–0.7)
Eosinophils Absolute: 0.1 10*3/uL (ref 0.0–0.7)
Eosinophils Relative: 0 % (ref 0–5)
Eosinophils Relative: 2 % (ref 0–5)
Lymphs Abs: 0.8 10*3/uL (ref 0.7–4.0)
Monocytes Absolute: 1 10*3/uL (ref 0.1–1.0)
Monocytes Absolute: 1 10*3/uL (ref 0.1–1.0)
Monocytes Relative: 11 % (ref 3–12)
Monocytes Relative: 12 % (ref 3–12)
Monocytes Relative: 9 % (ref 3–12)
Neutro Abs: 6.5 10*3/uL (ref 1.7–7.7)
Neutro Abs: 7 10*3/uL (ref 1.7–7.7)
Neutrophils Relative %: 65 % (ref 43–77)
Neutrophils Relative %: 75 % (ref 43–77)

## 2011-01-07 LAB — URINALYSIS, ROUTINE W REFLEX MICROSCOPIC
Glucose, UA: NEGATIVE mg/dL
Nitrite: POSITIVE — AB
Protein, ur: 100 mg/dL — AB
Urobilinogen, UA: 1 mg/dL (ref 0.0–1.0)

## 2011-01-07 LAB — AFB CULTURE WITH SMEAR (NOT AT ARMC)

## 2011-01-07 LAB — HEMOGLOBIN AND HEMATOCRIT, BLOOD: Hemoglobin: 8.1 g/dL — ABNORMAL LOW (ref 13.0–17.0)

## 2011-01-07 LAB — URINE MICROSCOPIC-ADD ON

## 2011-01-07 LAB — URINE CULTURE

## 2011-01-07 LAB — IRON AND TIBC
TIBC: 217 ug/dL (ref 215–435)
UIBC: 152 ug/dL

## 2011-01-07 NOTE — Discharge Summary (Signed)
NAME:  Patrick [name], Patrick [name] NO.:  [id number]  MEDICAL RECORD NO.:  [id number]          PATIENT TYPE:  LOCATION:                                 FACILITY:  PHYSICIAN:  Tilford Pillar, MD     DATE OF BIRTH:  [birth year]  DATE OF ADMISSION:12/25/2010 DATE OF DISCHARGE:12/26/2010                              DISCHARGE SUMMARY   DISCHARGE DIAGNOSES: 1. Episodes of fevers, likely due to human immunodeficiency virus. 2. Hypotension, likely due to increased dosage of medication,     improved. 3. Human immunodeficiency virus/acquired immune deficiency syndrome,     CD-4 count of 120, viral load 33 as of February 2012. 4. Human immunodeficiency virus nephropathy with baseline creatinine     of 2. 5. Hypertension. 6. Pyelonephritis with obstructive uropathy status post placement of     bilateral nephrostomy tubes in March 2012. 7. Secondary hyperparathyroidism. 8. Chronic back pain. 9. History of gallstones. 10.History of urinary tract infection of Staphylococcus aureus in     March 2010 and April 2010. 11.History of left kidney abscess with Staphylococcus aureus in March     2010. 12.Bacteremia of Staphylococcus aureus in March 2010 and April, 2010. 13.Left knee infection of methicillin-resistant Staphylococcus aureus     in October 2008. 14.Status post appendectomy in 2001. 15.History of kidney stones status post lithotripsy in 1999.  DISCHARGE MEDICATIONS: 1. Tylenol 325 mg/650 mg p.o. every 6 hours as needed. 2. Prednisone taper starting for 20 days starting dose with 60 mg. 3. Amlodipine 10 mg. 4. Dapsone 100 mg. 5. Diflucan 50 mg daily. 6. Sustiva 600 mg daily. 7. Hydrocortisone topical cream one application topically daily. 8. Ketoconazole cream 2% one application topically daily. 9. Labetalol 250 mg 1 tablet b.i.d. 10.Lamivudine 150 mg p.o. daily. 11.ReNu rewetting drops, 1 drop in both every 6 hours as needed for     dry eyes. 12.Isentress 400 mg p.o.  every 12 hours.  Chest x-ray on December 25, 2010 showed no acute pulmonary process.  BRIEF ADMISSION HISTORY AND PHYSICAL:  This is a 50 year old male with past medical history significant for HIV/AIDS with a CD-4 count of 120, viral load 33 (February 2012) and recent hospitalization from November 18, 2010, to December 01, 2010, for fever of unclear origin and acute renal failure who presents today as a direct [facility] for complaint of fever, malaise, problems with blood pressure control, and persistent cough.  The patient reports night sweats and intermittent fevers up to 102 Fahrenheit as well as significant fatigue following hospital discharge.  I believe the fatigue is partially related to changes made to antihypertensive regimen during his recent hospital stay and provides the record of home blood pressure readings with systolic blood pressure from 97-130.  He further mentioned decreased appetite since 1 week and decreased p.o. intake.  He also noted that he has been dizzy whenever he was standing up.  He describes his cough as dry and nonproductive and states that this was present for the past month and seemed to be improving after his recent hospitalization but is now worsening.  Denies any sputum production, sore throat, nasal  discharge, or hemoptysis, admits to sinus congestion. He noted a rash on his left foot which has been persistent for a very long time.  It improves and sometimes it gets worse but now disappears. Denies any sick contacts, denies weight loss, reports he has gained some weights back since he has been home.  Denies chest pain, syncope, palpitation, nausea, vomiting, diarrhea, abdominal pain, or any other complaints.  PHYSICAL EXAMINATION:  VITALS:  Temperature 99.9, heart rate 90, blood pressure 170/84, respiratory rate 14, saturation 100% on room air. GENERAL:  No acute distress, alert and oriented x3. HEENT:  PERRL, EOMI, no  icterus, no pallor. NECK:  Supple, no JVD, there are enlarged cervical lymph nodes with mild tenderness. LUNGS:  Clear to auscultation bilaterally, no wheezing/crackles. HEART:  Regular rate, tachycardia.  No murmur, rubs, or gallops. ABDOMEN:  Soft, nontender, distended, no bowel sounds. EXTREMITIES:  No edema or cyanosis, but left ankle of 5- x 6-cm confluent raised, scaly erythematous lesions, no exudates. NEUROLOGIC:  Alert and oriented x3, no focal motor deficits.  Decreased sensation to light touch on bilateral first and second toe.  CBC, WBC 3.2, hemoglobin 7.7, hematocrit 22.8, platelets 85,000.  Sodium 133, potassium 4.5 chloride 103, CO2 23, glucose 115, BUN 17, creatinine 1.56.  Total bili 0.6, alk phos 47, AST 14, ALT 13, total protein 6.7, albumin 2.7, calcium 8.4.  Urinalysis negative for pyuria or nitrites, proteins of 100.  HOSPITAL COURSE: 1. Fever, likely due to HIV.  The patient recently had an extensive     workup during his hospitalization including QuantiFERON Gold, EBV     panel, syphilis titer, CRP, acute hepatitis, sed rate, INCA, ANA,     SPEP, which were all negative.  Blood cultures and urine cultures     at that time did not show any growth.  Blood cultures during this     hospitalization, preliminarily reports no growth up to date.  The     patient underwent at the last hospitalization biopsy of deep left     axilla lymph node to investigate his diffuse lymphadenopathy and     fever.  Biopsy was negative.  Chest x-ray during this     hospitalization was negative for any acute cardiopulmonary process.     The patient has been afebrile during this hospital admission.     During the last hospitalization, endocarditis was a possibility,     although unlikely, and a TEE as an outpatient was considered. 2. Hypotension.  The patient's blood pressure was slightly low with a     systolic blood pressure of 98-100 during first hour following     hospitalization but  improved.  Blood pressure medications were held     initially during this hospital admission.  The patient was then     discharged on Norvasc and labetalol.  Consider to decrease dosages     if blood pressure continues to be low. 3. HIV.  The patient was continued on home medication.  Last CD-4     count in February 2012 was 120, viral load 33.  The patient will     follow up at the outpatient clinic. 4. Chronic renal failure during his hospital admission has been     stable. 5. Thrombocytopenia, chronic likely due to HIV. 6. Anemia.  Hemoglobin baseline between 7.5 and 8.5, has been stable     during this hospital admission, likely due to HIV.  DISCHARGE VITALS:  Temperature 99.8, pulse 91, respiratory rate 20,  blood pressure 126/64, saturation 97%.  CBC, WBC 3.1, hemoglobin 8.4, hematocrit 25.2, platelets 83,000.  BMET, sodium 136, potassium 4.5, chloride 102, CO2 23, glucose 149, BUN 17, creatinine 1.5, calcium 9.  Urine cultures, no growth.  TSH 2.64.  It was a pleasure taking care of this gentleman.    ______________________________ Almyra Deforest, MD   ______________________________ Tilford Pillar, MD    JI/MEDQ  D:  12/28/2010  T:  12/29/2010  Job:  [id number]  cc:   Cliffton Asters, M.D.  Electronically Signed by Almyra Deforest MD on 01/03/2011 05:01:26 PM Electronically Signed by Tilford Pillar  on 01/07/2011 02:07:39 PM

## 2011-01-08 ENCOUNTER — Other Ambulatory Visit: Payer: Self-pay | Admitting: *Deleted

## 2011-01-08 DIAGNOSIS — I10 Essential (primary) hypertension: Secondary | ICD-10-CM

## 2011-01-08 LAB — POCT URINALYSIS DIP (DEVICE)
Nitrite: NEGATIVE
Urobilinogen, UA: 2 mg/dL — ABNORMAL HIGH (ref 0.0–1.0)
pH: 5 (ref 5.0–8.0)

## 2011-01-08 LAB — DIFFERENTIAL
Basophils Absolute: 0 10*3/uL (ref 0.0–0.1)
Basophils Absolute: 0 10*3/uL (ref 0.0–0.1)
Basophils Absolute: 0 10*3/uL (ref 0.0–0.1)
Basophils Absolute: 0 10*3/uL (ref 0.0–0.1)
Basophils Relative: 0 % (ref 0–1)
Eosinophils Relative: 1 % (ref 0–5)
Lymphocytes Relative: 6 % — ABNORMAL LOW (ref 12–46)
Lymphocytes Relative: 3 % — ABNORMAL LOW (ref 12–46)
Lymphocytes Relative: 4 % — ABNORMAL LOW (ref 12–46)
Lymphs Abs: 0.5 10*3/uL — ABNORMAL LOW (ref 0.7–4.0)
Monocytes Absolute: 0.3 10*3/uL (ref 0.1–1.0)
Monocytes Absolute: 0.4 10*3/uL (ref 0.1–1.0)
Monocytes Absolute: 0.6 10*3/uL (ref 0.1–1.0)
Monocytes Relative: 6 % (ref 3–12)
Neutro Abs: 7.5 10*3/uL (ref 1.7–7.7)
Neutro Abs: 7.6 10*3/uL (ref 1.7–7.7)
Neutro Abs: 9.3 10*3/uL — ABNORMAL HIGH (ref 1.7–7.7)
Neutro Abs: 9.6 10*3/uL — ABNORMAL HIGH (ref 1.7–7.7)
Neutrophils Relative %: 91 % — ABNORMAL HIGH (ref 43–77)

## 2011-01-08 LAB — URINALYSIS, ROUTINE W REFLEX MICROSCOPIC
Bilirubin Urine: NEGATIVE
Nitrite: NEGATIVE
Nitrite: NEGATIVE
Specific Gravity, Urine: 1.02 (ref 1.005–1.030)
Specific Gravity, Urine: 1.023 (ref 1.005–1.030)
Urobilinogen, UA: 2 mg/dL — ABNORMAL HIGH (ref 0.0–1.0)
pH: 6 (ref 5.0–8.0)

## 2011-01-08 LAB — URINE MICROSCOPIC-ADD ON

## 2011-01-08 LAB — BASIC METABOLIC PANEL
CO2: 24 mEq/L (ref 19–32)
CO2: 26 mEq/L (ref 19–32)
CO2: 27 mEq/L (ref 19–32)
CO2: 18 mEq/L — ABNORMAL LOW (ref 19–32)
Calcium: 8.1 mg/dL — ABNORMAL LOW (ref 8.4–10.5)
Calcium: 8.3 mg/dL — ABNORMAL LOW (ref 8.4–10.5)
Calcium: 8.4 mg/dL (ref 8.4–10.5)
Calcium: 8 mg/dL — ABNORMAL LOW (ref 8.4–10.5)
Chloride: 105 mEq/L (ref 96–112)
Creatinine, Ser: 1.93 mg/dL — ABNORMAL HIGH (ref 0.4–1.5)
Creatinine, Ser: 1.94 mg/dL — ABNORMAL HIGH (ref 0.4–1.5)
Creatinine, Ser: 2.19 mg/dL — ABNORMAL HIGH (ref 0.4–1.5)
Creatinine, Ser: 4.45 mg/dL — ABNORMAL HIGH (ref 0.4–1.5)
GFR calc Af Amer: 35 mL/min — ABNORMAL LOW (ref >60)
GFR calc Af Amer: 39 mL/min — ABNORMAL LOW (ref >60)
GFR calc Af Amer: 45 mL/min — ABNORMAL LOW (ref >60)
GFR calc Af Amer: 45 mL/min — ABNORMAL LOW (ref >60)
GFR calc non Af Amer: 29 mL/min — ABNORMAL LOW (ref >60)
GFR calc non Af Amer: 32 mL/min — ABNORMAL LOW (ref >60)
GFR calc non Af Amer: 38 mL/min — ABNORMAL LOW (ref >60)
Glucose, Bld: 115 mg/dL — ABNORMAL HIGH (ref 70–99)
Glucose, Bld: 189 mg/dL — ABNORMAL HIGH (ref 70–99)
Potassium: 4.2 mEq/L (ref 3.5–5.1)
Sodium: 135 mEq/L (ref 135–145)
Sodium: 138 mEq/L (ref 135–145)
Sodium: 140 mEq/L (ref 135–145)
Sodium: 141 mEq/L (ref 135–145)

## 2011-01-08 LAB — GLUCOSE, CAPILLARY
Glucose-Capillary: 102 mg/dL — ABNORMAL HIGH (ref 70–99)
Glucose-Capillary: 104 mg/dL — ABNORMAL HIGH (ref 70–99)
Glucose-Capillary: 114 mg/dL — ABNORMAL HIGH (ref 70–99)
Glucose-Capillary: 125 mg/dL — ABNORMAL HIGH (ref 70–99)
Glucose-Capillary: 127 mg/dL — ABNORMAL HIGH (ref 70–99)
Glucose-Capillary: 131 mg/dL — ABNORMAL HIGH (ref 70–99)
Glucose-Capillary: 132 mg/dL — ABNORMAL HIGH (ref 70–99)
Glucose-Capillary: 133 mg/dL — ABNORMAL HIGH (ref 70–99)
Glucose-Capillary: 136 mg/dL — ABNORMAL HIGH (ref 70–99)
Glucose-Capillary: 138 mg/dL — ABNORMAL HIGH (ref 70–99)
Glucose-Capillary: 144 mg/dL — ABNORMAL HIGH (ref 70–99)
Glucose-Capillary: 152 mg/dL — ABNORMAL HIGH (ref 70–99)
Glucose-Capillary: 156 mg/dL — ABNORMAL HIGH (ref 70–99)
Glucose-Capillary: 161 mg/dL — ABNORMAL HIGH (ref 70–99)
Glucose-Capillary: 166 mg/dL — ABNORMAL HIGH (ref 70–99)
Glucose-Capillary: 192 mg/dL — ABNORMAL HIGH (ref 70–99)
Glucose-Capillary: 95 mg/dL (ref 70–99)
Glucose-Capillary: 142 mg/dL — ABNORMAL HIGH (ref 70–99)
Glucose-Capillary: 144 mg/dL — ABNORMAL HIGH (ref 70–99)
Glucose-Capillary: 158 mg/dL — ABNORMAL HIGH (ref 70–99)
Glucose-Capillary: 164 mg/dL — ABNORMAL HIGH (ref 70–99)
Glucose-Capillary: 169 mg/dL — ABNORMAL HIGH (ref 70–99)
Glucose-Capillary: 169 mg/dL — ABNORMAL HIGH (ref 70–99)
Glucose-Capillary: 182 mg/dL — ABNORMAL HIGH (ref 70–99)
Glucose-Capillary: 188 mg/dL — ABNORMAL HIGH (ref 70–99)
Glucose-Capillary: 198 mg/dL — ABNORMAL HIGH (ref 70–99)
Glucose-Capillary: 210 mg/dL — ABNORMAL HIGH (ref 70–99)
Glucose-Capillary: 226 mg/dL — ABNORMAL HIGH (ref 70–99)
Glucose-Capillary: 260 mg/dL — ABNORMAL HIGH (ref 70–99)
Glucose-Capillary: 272 mg/dL — ABNORMAL HIGH (ref 70–99)

## 2011-01-08 LAB — CBC
HCT: 25.1 % — ABNORMAL LOW (ref 39.0–52.0)
HCT: 27.9 % — ABNORMAL LOW (ref 39.0–52.0)
HCT: 32.2 % — ABNORMAL LOW (ref 39.0–52.0)
HCT: 33.7 % — ABNORMAL LOW (ref 39.0–52.0)
HCT: 35.7 % — ABNORMAL LOW (ref 39.0–52.0)
Hemoglobin: 8 g/dL — ABNORMAL LOW (ref 13.0–17.0)
Hemoglobin: 8.1 g/dL — ABNORMAL LOW (ref 13.0–17.0)
Hemoglobin: 8.5 g/dL — ABNORMAL LOW (ref 13.0–17.0)
Hemoglobin: 8.5 g/dL — ABNORMAL LOW (ref 13.0–17.0)
Hemoglobin: 10.1 g/dL — ABNORMAL LOW (ref 13.0–17.0)
Hemoglobin: 9.8 g/dL — ABNORMAL LOW (ref 13.0–17.0)
MCHC: 34.3 g/dL (ref 30.0–36.0)
MCHC: 34.8 g/dL (ref 30.0–36.0)
MCHC: 34.4 g/dL (ref 30.0–36.0)
MCHC: 34.7 g/dL (ref 30.0–36.0)
MCHC: 35.3 g/dL (ref 30.0–36.0)
MCV: 93.5 fL (ref 78.0–100.0)
MCV: 91.6 fL (ref 78.0–100.0)
MCV: 92.2 fL (ref 78.0–100.0)
MCV: 92.5 fL (ref 78.0–100.0)
MCV: 93.5 fL (ref 78.0–100.0)
Platelets: 193 10*3/uL (ref 150–400)
Platelets: 100 10*3/uL — ABNORMAL LOW (ref 150–400)
Platelets: 123 10*3/uL — ABNORMAL LOW (ref 150–400)
Platelets: 207 10*3/uL (ref 150–400)
Platelets: 250 10*3/uL (ref 150–400)
RBC: 2.49 MIL/uL — ABNORMAL LOW (ref 4.22–5.81)
RBC: 2.53 MIL/uL — ABNORMAL LOW (ref 4.22–5.81)
RBC: 2.6 MIL/uL — ABNORMAL LOW (ref 4.22–5.81)
RBC: 3.61 MIL/uL — ABNORMAL LOW (ref 4.22–5.81)
RDW: 13.3 % (ref 11.5–15.5)
RDW: 13.2 % (ref 11.5–15.5)
RDW: 13.4 % (ref 11.5–15.5)
RDW: 13.4 % (ref 11.5–15.5)
RDW: 13.4 % (ref 11.5–15.5)
RDW: 13.7 % (ref 11.5–15.5)
WBC: 5.1 10*3/uL (ref 4.0–10.5)
WBC: 6.4 10*3/uL (ref 4.0–10.5)
WBC: 8.3 10*3/uL (ref 4.0–10.5)
WBC: 8.2 10*3/uL (ref 4.0–10.5)

## 2011-01-08 LAB — COMPREHENSIVE METABOLIC PANEL
Albumin: 1.9 g/dL — ABNORMAL LOW (ref 3.5–5.2)
Albumin: 2 g/dL — ABNORMAL LOW (ref 3.5–5.2)
BUN: 105 mg/dL — ABNORMAL HIGH (ref 6–23)
BUN: 86 mg/dL — ABNORMAL HIGH (ref 6–23)
BUN: 95 mg/dL — ABNORMAL HIGH (ref 6–23)
CO2: 23 mEq/L (ref 19–32)
Calcium: 8.1 mg/dL — ABNORMAL LOW (ref 8.4–10.5)
Chloride: 94 mEq/L — ABNORMAL LOW (ref 96–112)
Creatinine, Ser: 6.52 mg/dL — ABNORMAL HIGH (ref 0.4–1.5)
Creatinine, Ser: 7.42 mg/dL — ABNORMAL HIGH (ref 0.4–1.5)
Creatinine, Ser: 7.57 mg/dL — ABNORMAL HIGH (ref 0.4–1.5)
GFR calc non Af Amer: 8 mL/min — ABNORMAL LOW (ref >60)
Glucose, Bld: 139 mg/dL — ABNORMAL HIGH (ref 70–99)
Total Bilirubin: 4.1 mg/dL — ABNORMAL HIGH (ref 0.3–1.2)
Total Protein: 6.6 g/dL (ref 6.0–8.3)
Total Protein: 7.1 g/dL (ref 6.0–8.3)

## 2011-01-08 LAB — RENAL FUNCTION PANEL
Albumin: 2 g/dL — ABNORMAL LOW (ref 3.5–5.2)
Albumin: 1.9 g/dL — ABNORMAL LOW (ref 3.5–5.2)
Albumin: 2 g/dL — ABNORMAL LOW (ref 3.5–5.2)
BUN: 39 mg/dL — ABNORMAL HIGH (ref 6–23)
BUN: 52 mg/dL — ABNORMAL HIGH (ref 6–23)
BUN: 72 mg/dL — ABNORMAL HIGH (ref 6–23)
BUN: 84 mg/dL — ABNORMAL HIGH (ref 6–23)
CO2: 17 mEq/L — ABNORMAL LOW (ref 19–32)
Calcium: 8.3 mg/dL — ABNORMAL LOW (ref 8.4–10.5)
Calcium: 7.9 mg/dL — ABNORMAL LOW (ref 8.4–10.5)
Calcium: 8.1 mg/dL — ABNORMAL LOW (ref 8.4–10.5)
Calcium: 8.4 mg/dL (ref 8.4–10.5)
Chloride: 111 mEq/L (ref 96–112)
Creatinine, Ser: 2.82 mg/dL — ABNORMAL HIGH (ref 0.4–1.5)
Creatinine, Ser: 3.61 mg/dL — ABNORMAL HIGH (ref 0.4–1.5)
Creatinine, Ser: 4.45 mg/dL — ABNORMAL HIGH (ref 0.4–1.5)
Glucose, Bld: 147 mg/dL — ABNORMAL HIGH (ref 70–99)
Glucose, Bld: 160 mg/dL — ABNORMAL HIGH (ref 70–99)
Glucose, Bld: 175 mg/dL — ABNORMAL HIGH (ref 70–99)
Phosphorus: 3.8 mg/dL (ref 2.3–4.6)
Phosphorus: 4.1 mg/dL (ref 2.3–4.6)
Phosphorus: 4.1 mg/dL (ref 2.3–4.6)
Phosphorus: 5.1 mg/dL — ABNORMAL HIGH (ref 2.3–4.6)
Potassium: 4.1 mEq/L (ref 3.5–5.1)
Potassium: 3.7 mEq/L (ref 3.5–5.1)
Potassium: 4.5 mEq/L (ref 3.5–5.1)
Sodium: 137 mEq/L (ref 135–145)
Sodium: 141 mEq/L (ref 135–145)

## 2011-01-08 LAB — URINE CULTURE: Colony Count: 7000

## 2011-01-08 LAB — HEPATITIS A ANTIBODY, IGM: Hep A IgM: NEGATIVE

## 2011-01-08 LAB — CULTURE, ROUTINE-ABSCESS

## 2011-01-08 LAB — POCT I-STAT, CHEM 8
HCT: 38 % — ABNORMAL LOW (ref 39.0–52.0)
Hemoglobin: 12.9 g/dL — ABNORMAL LOW (ref 13.0–17.0)
Potassium: 3.9 mEq/L (ref 3.5–5.1)
Sodium: 129 mEq/L — ABNORMAL LOW (ref 135–145)

## 2011-01-08 LAB — HIV 1/2 CONFIRMATION
HIV-1 antibody: POSITIVE
HIV-1 antibody: POSITIVE
HIV-2 Ab: NEGATIVE
HIV-2 Ab: NEGATIVE

## 2011-01-08 LAB — CULTURE, BLOOD (ROUTINE X 2)
Culture: NO GROWTH
Culture: NO GROWTH
Culture: NO GROWTH

## 2011-01-08 LAB — MAGNESIUM: Magnesium: 3.4 mg/dL — ABNORMAL HIGH (ref 1.5–2.5)

## 2011-01-08 LAB — INHIBIN A: Inhibin-A: 1.1 pg/mL (ref <2.0)

## 2011-01-08 LAB — C3 COMPLEMENT: C3 Complement: 147 mg/dL (ref 88–201)

## 2011-01-08 LAB — PROTIME-INR
INR: 1.2 (ref 0.00–1.49)
Prothrombin Time: 15.5 seconds — ABNORMAL HIGH (ref 11.6–15.2)

## 2011-01-08 LAB — CREATININE, URINE, 24 HOUR
Creatinine, 24H Ur: 2050 mg/d — ABNORMAL HIGH (ref 800–2000)
Creatinine, Urine: 60.3 mg/dL

## 2011-01-08 LAB — HLA-B27 ANTIGEN

## 2011-01-08 LAB — HIV-1 RNA QUANT-NO REFLEX-BLD: HIV-1 RNA Quant, Log: 5.73 {Log} — ABNORMAL HIGH (ref <1.68)

## 2011-01-08 LAB — GLUCOSE 6 PHOSPHATE DEHYDROGENASE: G-6-PD, Quant: 11 U/g{Hb} (ref 7–20)

## 2011-01-08 LAB — GC/CHLAMYDIA PROBE AMP, URINE: Chlamydia, Swab/Urine, PCR: NEGATIVE

## 2011-01-08 LAB — PHOSPHORUS
Phosphorus: 3.3 mg/dL (ref 2.3–4.6)
Phosphorus: 5.2 mg/dL — ABNORMAL HIGH (ref 2.3–4.6)
Phosphorus: 6 mg/dL — ABNORMAL HIGH (ref 2.3–4.6)

## 2011-01-08 LAB — PTH, INTACT AND CALCIUM: Calcium, Total (PTH): 7.5 mg/dL — ABNORMAL LOW (ref 8.4–10.5)

## 2011-01-08 LAB — HIV ANTIBODY (ROUTINE TESTING W REFLEX)
HIV: REACTIVE — AB
HIV: REACTIVE — AB

## 2011-01-08 LAB — VITAMIN D 1,25 DIHYDROXY: Vitamin D 1, 25 (OH)2 Total: 35 pg/mL (ref 18–72)

## 2011-01-08 LAB — PATHOLOGIST SMEAR REVIEW

## 2011-01-08 LAB — HEMOGLOBIN A1C: Hgb A1c MFr Bld: 6.5 % — ABNORMAL HIGH (ref 4.6–6.1)

## 2011-01-08 LAB — TECHNOLOGIST SMEAR REVIEW

## 2011-01-08 LAB — HEPATITIS C ANTIBODY: HCV Ab: NEGATIVE

## 2011-01-08 LAB — HIV-1 GENOTYPR PLUS

## 2011-01-08 LAB — CREATININE, URINE, RANDOM: Creatinine, Urine: 64.2 mg/dL

## 2011-01-08 LAB — HEPATITIS B CORE ANTIBODY, IGM: Hep B C IgM: NEGATIVE

## 2011-01-08 MED ORDER — LABETALOL HCL 200 MG PO TABS: 200.0000 mg | ORAL_TABLET | Freq: Two times a day (BID) | ORAL | Status: DC

## 2011-01-13 ENCOUNTER — Ambulatory Visit: Payer: Self-pay | Admitting: Internal Medicine

## 2011-01-20 ENCOUNTER — Ambulatory Visit: Payer: Self-pay | Admitting: Internal Medicine

## 2011-01-27 ENCOUNTER — Encounter: Payer: Self-pay | Admitting: Internal Medicine

## 2011-01-27 ENCOUNTER — Ambulatory Visit (INDEPENDENT_AMBULATORY_CARE_PROVIDER_SITE_OTHER): Payer: Medicaid Other | Admitting: Internal Medicine

## 2011-01-27 VITALS — BP 156/93 | HR 108 | Temp 98.6°F | Ht 72.0 in | Wt 222.2 lb

## 2011-01-27 DIAGNOSIS — I10 Essential (primary) hypertension: Secondary | ICD-10-CM

## 2011-01-27 DIAGNOSIS — B20 Human immunodeficiency virus [HIV] disease: Secondary | ICD-10-CM

## 2011-01-27 DIAGNOSIS — B353 Tinea pedis: Secondary | ICD-10-CM

## 2011-01-27 MED ORDER — TRIAMCINOLONE ACETONIDE 0.025 % EX OINT: TOPICAL_OINTMENT | Freq: Two times a day (BID) | CUTANEOUS | Status: DC

## 2011-01-27 NOTE — Progress Notes (Signed)
  Subjective:    Patient ID: Patrick [name], male    DOB: 10/10/1960, 50 y.o.   MRN: [id number]  HPI Patrick [name] is in for his hospital f/u visit. He was rehospitalized 1 month ago with recurrent immune reconstitution inflammatory syndrome (IRIS). He improved with a 3 week taper of prednisone. His fever and adenopathy have resolved. His pruritic rash on his L ankle improved on prednisone but has worsened off of it. He ran out of his amlodipine and is only on labatelol for his htn now. He has not missed any of his meds.    Review of Systems     Objective:   Physical Exam  Constitutional: He appears well-developed. No distress.  HENT:  Mouth/Throat: Oropharynx is clear and moist. No oropharyngeal exudate.  Eyes: Conjunctivae are normal. No scleral icterus.  Cardiovascular: Normal rate, regular rhythm and normal heart sounds.   No murmur heard. Pulmonary/Chest: Breath sounds normal. He has no wheezes. He has no rales.  Abdominal: Soft. Bowel sounds are normal.  Skin:       Dry scaly rash on his medial ankle          Assessment & Plan:

## 2011-01-27 NOTE — Assessment & Plan Note (Signed)
His rash is nonspecific. I did not improve with ketoconazole. I will try triamcinolone ointment.

## 2011-01-27 NOTE — Assessment & Plan Note (Signed)
He will see Dr. Hyman Hopes tomorrow and I will let him address his BP.I will recheck a BMP today.

## 2011-01-27 NOTE — Assessment & Plan Note (Signed)
I will continue his current regimen and repeat labs today.

## 2011-01-28 ENCOUNTER — Other Ambulatory Visit: Payer: Self-pay | Admitting: *Deleted

## 2011-01-28 DIAGNOSIS — I10 Essential (primary) hypertension: Secondary | ICD-10-CM

## 2011-01-28 MED ORDER — AMLODIPINE BESYLATE 10 MG PO TABS: 10.0000 mg | ORAL_TABLET | Freq: Every day | ORAL | Status: DC

## 2011-01-30 ENCOUNTER — Other Ambulatory Visit: Payer: Medicaid Other

## 2011-02-10 NOTE — H&P (Signed)
NAME:  Patrick [name], Patrick [name]             ACCOUNT NO.:  [id number]   MEDICAL RECORD NO.:  [id number]          PATIENT TYPE:  INP   LOCATION:  6738                         FACILITY:  [facility]   PHYSICIAN:  Carlena Hurl, MDDATE OF BIRTH:  1961-06-09   DATE OF ADMISSION:  12/03/2008  DATE OF DISCHARGE:                              HISTORY & PHYSICAL   TIME:  2300.   CHIEF COMPLAINT:  Abdominal pain, dysuria.   HISTORY OF PRESENT ILLNESS:  A 50 year old male with a remote history of  appendectomy in 2001, kidney stones in 1999, status post lithotripsy and  MRSA infection of the knee in October, 2008, presenting with 10 days of  abdominal pain followed by nausea, vomiting, dysuria, fever, chills for  5 days.  He feels that his symptoms are similar to his prior episode of  kidney stones.  He also notes that he has pain in his left knuckles and  his wrist.  Denies pain in other joints, however.  He has been able to  drink fluids and tolerate a soft diet, but he has been unable to eat  solid foods secondary to lack of desire secondary to his abdominal pain.  His urinary symptoms have become worse over the last 5 days, as has his  fever, chills, and abdominal pain, which is localized to his left lower  quadrant and back.   He has an allergy to SULFA, including BACTRIM, which causes a rash,  CODEINE, and he may have a LATEX allergy, but he is not sure.  That  would also cause rash.   PAST MEDICAL HISTORY:  Appendectomy in 2001.  He was noted to have  gallstones at that time.  He is status post a lithotripsy in the late  1990s for stones.  That was done at [facility].  He had a MRSA  abscess of his left knee in October, 2008.   MEDICATIONS:  Motrin 200 mg, about 4 tabs a day as needed for pain over  the last 10 days with little effect.  He takes the herbal supplements  chlorophyll, omega 3 fatty acids, grapeseed extract, and flax oil.   He is adopted and does not know his  family history.   SOCIAL HISTORY:  Denies alcohol, smoking, or drugs.  He is single.  He  has no kids.   REVIEW OF SYSTEMS:  Headaches.  He has multiple GI complaints, nausea,  vomiting, diarrhea, although his diarrhea is improved.  The nausea is  now only mild.  The vomiting has persisted, as has reflux.  He has left  flank pain.  He has wrist and finger pain in his left hand.  He is prone  to develop rashes easily.  He denies tick exposure.   PHYSICAL EXAMINATION:  Temperature 102.2, heart rate 106, blood pressure  122/78, respiratory rate 30, O2 95% on room air.  GENERAL APPEARANCE:  He is obese and in moderate distress.  He has  recently received 1 mg of Dilaudid.  HEENT:  Within normal limits with moist mucosa.  PULMONARY:  Clear to auscultation anteriorly and bilaterally.  CARDIAC:  S1  and S2.  Tachycardic to 110 with no murmur.  ABDOMEN:  He is distended.  His abdomen is soft with tenderness to  palpation over the left lower quadrant that is moderate.  Again, of  note, he has just received Dilaudid, and palpation was only to a  moderate depth.  He does have minimal rebound.  No guarding, however.  EXTREMITIES:  No edema.  NEURO:  Nonfocal.  PSYCH:  Unremarkable but appropriate.   LABS:  White blood cell count 8.8, hemoglobin 12.2, hematocrit 35,  platelets 100.  UA:  Large blood, pH 5.5, protein greater than 300,  negative nitrite, trace leukocytes, 3 to 6 WBCs, 11 to 20 RBCs, and few  bacteria.  CK 107.  His i-STAT has sodium 129, potassium 2.9, chloride  97, bicarb 25, BUN 83, creatinine 7.5, glucose 172.  His ionized calcium  was 1.02, normal range 1.12 to 1.32.  ESR was 125, magnesium 2.9.   Chest x-ray, PA and lateral, was negative.   A CT of the abdomen and pelvis without contrast was notable for 2 upper  left kidney ureteral calculi causing a high-grade obstruction with  hydronephrosis.  The largest was 7 x 7 mm.  He also has bilateral renal  calculi and gallstones  with mild diffuse bladder wall thickening but no  stones in the bladder.  He has a left inguinal hernia.   ASSESSMENT/PLAN:  1. Left nephrolithiasis with high-grade obstruction, hydronephrosis.      Urology has been consulted and will evaluate in the a.m. of March 9      for determination if they are going to do a procedure.  As per the      ED physician, they will do a procedure once the infection has      cleared.  We will need to contact them in the a.m. of March 9 to      confirm that a procedure will be done.  He is initiated on IV      Rocephin but will broaden to Zosyn, given that this is possibly      complicated pyelonephritis with his temperature in addition to      tachycardia, making him meet criteria for sepsis.  He does have 2      to 6 white blood cells in the urine.  However, removal of the stone      or at least treatment of the hydronephrosis will likely be key for      his improvement.  Will await plan by urology in the a.m. in      addition to renal, who is also following along.  Their assistance      is appreciated.  Will check a serum-intact PTH, recheck a CMET,      check a phosphorus.  Follow up with renal for improvement  with      management of the stone.  When issues have resolved, pursue a      workup for the etiology of his persistent renal and gallstones.  2. Acute renal failure:  Unsure if acute, although it is noted on CT      that it is likely and preferable for his long-term prognosis.  His      BUN is elevated, so baseline unknown.  His creatinine and BUN were      normal in 2001.  Check a serum-intact PTH, as above.  Follow up the      results of his lab workup.  Renal assistance is  greatly      appreciated.  3. Fever, likely secondary to #1.  Post-obstructive infection.  Will      continue with Rocephin.  Follow clinically.  Follow up cultures.      Treat as in #1.  Monitor closely for deterioration, as that would      prompt an emergent need for a  urological procedure.  4. Hyperglycemia:  Check an A1C, treat expectantly.  Check CBGs q.6h.      Likely an outpatient issue.  Patient will need improved primary      care followup.  5. Cholelithiasis:  Not an issue currently.  Follow expectantly.  Work      up causes for chronic stones, as in #1.  6. Prophylaxis:  SCDs with pending procedure.   DISPOSITION:  Admit for management of acute renal failure, secondary to  obstructive nephrolithiasis with urologic procedure possible in the  morning.  Follow up with the results of his workup, pain control,  monitoring urine  output, and for signs of acute change.  Appreciate nephrology and  urology assistance.  Follow up fever trends, signs for worsening sepsis  or development of a septic shock, which would warrant emergent procedure  and transfer to a higher acuity of care.      Valetta Close, M.D.  Electronically Signed      Carlena Hurl, MD  Electronically Signed    JC/MEDQ  D:  12/03/2008  T:  12/04/2008  Job:  [id number]

## 2011-02-10 NOTE — Discharge Summary (Signed)
NAME:  MALONE, VANBLARCOM NO.:  [id number]   MEDICAL RECORD NO.:  [id number]          PATIENT TYPE:  INP   LOCATION:  5010                         FACILITY:  [facility]   PHYSICIAN:  Altha Harm, MDDATE OF BIRTH:  10/12/1960   DATE OF ADMISSION:  01/17/2009  DATE OF DISCHARGE:  01/21/2009                               DISCHARGE SUMMARY   DISCHARGE DISPOSITION:  Home.   FINAL DISCHARGE DIAGNOSES:  1. Acute pyelonephritis with recurrent methicillin-sensitive Staph      aureus.  2. Indwelling double-J stent.  3. HIV/AIDS.  4. Normocytic anemia.  5. Hypertension.   DISCHARGE MEDICATIONS:  1. Labetalol 200 mg p.o. b.i.d.  2. Azithromycin 1200 mg p.o. weekly.  3. Diflucan 100 mg p.o. weekly.  4. Dapsone 100 mg p.o. daily.  5. Atripla 1 tab p.o. q.h.s.  6. Keflex 500 mg p.o. q.i.d. x14 days.   CONSULTANTS:  Infectious Diseases, Dr. Orvan Falconer.   PROCEDURES:  None.   DIAGNOSTIC STUDIES:  1. Portable chest x-ray done on admission which shows no active      cardiopulmonary disease.  2. CT abdomen and pelvis without contrast done on admission which      shows left double-J catheter in good position.  Mild left greater      than right perinephric stranding.  Nephrocalcinosis.      Cholelithiasis.  No significant obstructive uropathy or drainable      fluid collection.  A double-J catheter in good position on the      left.   CODE STATUS:  Full code.   ALLERGIES:  1. CODEINE.  2. SULFA.  3. LATEX.   PRIMARY CARE PHYSICIAN:  Unassigned, however, the patient is an active  patient of Dr. Orvan Falconer in the HIV clinic and as such should be admitted  to the teaching service whenever he presents to the hospital.   CHIEF COMPLAINT:  Fever.   HISTORY OF PRESENT ILLNESS:  Please see the H and P dictated on January 18, 2009, by Dr. Corky Downs for details of the HPI.   HOSPITAL COURSE:  1. The patient was admitted with a urinary tract infection.  He was      started  initially on vancomycin IV.  Urine cultures grew out      methicillin-sensitive Staph aureus and the patient was switched      over to Keflex.  The patient is being sent home on a total of 14      days of Keflex as the pathogen was found both in the urine and in      the blood.  I spoke with the urologist regarding the fact that the      patient has developed this new infection while he has a double-J      stent in.  The stent needs to be removed and can be removed at any      time during this course of therapy as it is unlikely that the      urinary tract infection will clear unless the stent is removed.  I      have advised the  patient to call his urologist and to make an      appointment to be seen within the week or as soon as possible.  I      have spoken with Dr. Ninetta Lights, Infectious Diseases, who is in full      agreement that the stent needs to be removed as soon as possible.  2. Normochromic anemia:  This is probably secondary to the excessive      blood loss the patient experienced from his last hospitalization up      until now.  Patient had an iron study done which shows his iron to      be within normal limits and I suspect that the patient would      probably as he receives treatment for his HIV and his other chronic      diseases will actually improve his hematocrit and hemoglobin.  3. Hypertension.  The patient was continued on his labetalol and was      able to maintain blood pressures in normocytic range.   DIETARY RESTRICTIONS:  The patient should be on a 2-g sodium diet.   PHYSICAL RESTRICTIONS:  Activity as tolerated.   TOTAL TIME:  35 minutes.      Altha Harm, MD  Electronically Signed     MAM/MEDQ  D:  01/21/2009  T:  01/21/2009  Job:  [id number]   cc:   Cliffton Asters, M.D.  [facility]

## 2011-02-10 NOTE — Discharge Summary (Signed)
NAME:  Patrick [name], Patrick [name] NO.:  [id number]   MEDICAL RECORD NO.:  [id number]          PATIENT TYPE:  INP   LOCATION:  6738                         FACILITY:  [facility]   PHYSICIAN:  Beckey Rutter, MD  DATE OF BIRTH:  1961/05/29   DATE OF ADMISSION:  12/03/2008  DATE OF DISCHARGE:  12/17/2008                               DISCHARGE SUMMARY   ADDENDUM   DISCHARGE DIAGNOSIS:  Dictated in the above-mentioned discharge summary.   DISCHARGE MEDICATIONS:  1. Epivir 150 mg p.o. daily.  2. Norvir 100 mg p.o. daily.  3. Retrovir 300 mg p.o. b.i.d.  4. Prezista 800 mg p.o. daily.  5. Ultram 50 mg p.o. q.6 h. p.r.n. for headaches.  6. Diflucan 100 mg p.o. weekly.  7. Ancef 1 gram IV q. 8 hours piggyback for 15 days more.  8. Dapsone 100 mg p.o. daily.  9. Azithromycin 1200 mg p.o. weekly.  10.Labetalol 200 mg p.o. b.i.d.  11.Zofran 4 mg p.o. q.6 h. p.r.n.   DISCHARGE PLAN:  The patient will be called from the ID Clinic for  further evaluation and assessment.  His medication will be filled  through the help of the Infectious Disease Clinic.  I contacted [facility] to finalize the medication assistant.  The patient is aware and  agreeable to discharge plan.   The patient had headache on the last 3 days mild controlled by Ultram.  The patient was advised to take Tylenol p.r.n. as well as the Ultram for  the headache.      Beckey Rutter, MD  Electronically Signed     EME/MEDQ  D:  12/17/2008  T:  12/18/2008  Job:  [id number]

## 2011-02-10 NOTE — H&P (Signed)
NAME:  Patrick [name], Patrick [name]             ACCOUNT NO.:  [id number]   MEDICAL RECORD NO.:  [id number]          PATIENT TYPE:  INP   LOCATION:  5010                         FACILITY:  [facility]   PHYSICIAN:  Mobolaji B. Bakare, M.D.DATE OF BIRTH:  1961-05-15   DATE OF ADMISSION:  01/17/2009  DATE OF DISCHARGE:                              HISTORY & PHYSICAL   PRIMARY CARE PHYSICIAN:  Unassigned.   INFECTIOUS DISEASE PHYSICIAN:  Cliffton Asters, MD   PRIMARY UROLOGIST:  Valetta Fuller, MD   PRIMARY NEPHROLOGIST:  Garnetta Buddy, MD   CHIEF COMPLAINT:  Fever.   HISTORY OF PRESENTING COMPLAINT:  Mr. Depuy is a 50 year old  Caucasian male who was recently diagnosed in March 2010 with HIV, renal  failure, and obstructive uropathy.  The patient had stent placement by  Dr. Isabel Caprice and he has an appointment to follow up with him sometimes in  May.  The patient was diagnosed with HIV with CD-4 count of 10 during  the last hospitalization.  He is currently on antiretroviral medication  and he has followed up with Dr. Cliffton Asters in the outpatient setting.  He was treated for methicillin-sensitive Staph aureus during his last  hospitalization as well and the patient completed 14 days of antibiotics  with Ancef.   The patient reported that he developed fever a few days ago and  yesterday he noted dysuria.  There has been no chills.  He has had left-  sided flank pain.  He decided to come over to the emergency room.  His  white cell count is normal.  He had a CT scan of abdomen and pelvis,  which did not show any abscess or significant obstructive uropathy, and  the double-J catheter was in good position on the left.   The patient denies nausea, vomiting.  There is no diarrhea.  No  headaches.  No chest pain.   REVIEW OF SYSTEMS:  Rest of review of systems is negative.   PAST MEDICAL HISTORY:  1. HIV positive, AIDS.  2. Nephrolithiasis status post double-J stent, left.  3. Hypertension.  4.  MSSA bacteremia in March 2010.  5. History of gallstones.  6. Pyelonephritis.  7. Acute renal failure.  He has an appointment to follow up with Dr.      Hyman Hopes in a few weeks.  8. Secondary hyperparathyroidism.  9. Chronic back pain.   CURRENT MEDICATIONS:  1. Labetalol 200 mg p.o. b.i.d.  2. Azithromycin 600 mg 2 tablets weekly.  3. Fluconazole 100 mg weekly.  4. Dapsone 100 mg daily.  5. Atripla one p.o. nightly.   ALLERGIES:  SULFA causes rash, CODEINE unknown.   FAMILY HISTORY:  Both parents are deceased.  No family history of  diabetes mellitus or CAD.   SOCIAL HISTORY:  The patient does not smoke cigarettes or drink alcohol.  He was in the sales business.   PHYSICAL EXAMINATION:  VITAL SIGNS:  Temperature 103, blood pressure  155/93, pulse rate of 124, respiratory rate 16.  GENERAL:  The patient is not acutely ill looking, not in acute  respiratory distress.  HEENT:  Normocephalic, atraumatic.  Pupils equal, round, and reactive to  light.  Mucous membranes dry.  No oral thrush.  NECK:  No carotid bruit.  LUNGS:  Clear clinically to auscultation.  CVS:  S1, S2 regular.  ABDOMEN:  Not distended, soft, nontender.  Bowel sounds present.  Left  renal angle tenderness.  EXTREMITIES:  No pedal edema or calf tenderness.  Dorsalis pedis pulses  palpable bilaterally.  CNS:  No focal neurological deficit.  SKIN:  Papules noted on the anterior chest wall.  There is no  surrounding cellulitis.   INITIAL LABORATORY DATA:  Urinalysis is turbid with specific gravity of  1.28, many bacteria, too numerous to count white blood cells, red blood  cells of 7-10.  Urine glucose is negative, protein is 100, ketones  negative, blood is moderate, large leukocytes, and nitrite is positive.  Lipase 19.  Sodium 134, potassium 4.6, chloride 102, CO2 of 24, glucose  111, BUN 18, creatinine 1.99, bilirubin 1.3, alkaline phosphatase 58,  AST 21, ALT 16, total protein 8.0, albumin 3.8, calcium 9.7.   White cell  9.4, hemoglobin 10, hematocrit 29.2, platelets 168, normal differential.   RADIOLOGIC DATA:  CT scan of abdomen and pelvis shows left double-J  stent in good position, mild left greater than right perinephric  stranding, nephrocalcinosis, cholelithiasis.  No significant obstructive  uropathy or drainage of low fluid collection.  Double-J catheter in good  position.  Note that this CT was done without IV contrast.   ASSESSMENT AND PLAN:  1. Fever likely secondary to acute pyelonephritis, rule out      bacteremia, and probably ALE sepsis.  The patient will be      pancultured with two sets of blood cultures, urine culture, and we      will start coverage with broad-spectrum antibiotic, cefepime 1 g IV      q.12 h., and vancomycin will be dosed per pharmacy.  We will give      IV fluids and normal saline.  The patient is clinically dehydrated      and urine specific gravity is on the high side of normal.  2. Human immunodeficiency virus, acquired immune deficiency syndrome.      We will continue with antiretroviral and prophylactic medications      including azithromycin, dapsone, Diflucan.  We will consider      consulting Infectious Disease during this hospitalization.  3. History of nephrolithiasis status post left double-J stent      placement.  The patient will be seen by Dr. Annabell Howells, emergency room      physician as already contacted and he will be consulting.  4. Renal insufficiency.  This appears to be stable at this time.  I      will continue to monitor on those medications for creatinine      clearance.  5. Hypertension, uncontrolled.  We will resume labetalol and further      adjustment per the trend of his blood pressure during this      hospitalization.  6. Code status, full code.      Mobolaji B. Corky Downs, M.D.     MBB/MEDQ  D:  01/18/2009  T:  01/18/2009  Job:  [id number]   cc:   Cliffton Asters, M.D.

## 2011-02-10 NOTE — Discharge Summary (Signed)
NAME:  Patrick [name], Patrick [name] NO.:  [id number]   MEDICAL RECORD NO.:  [id number]          PATIENT TYPE:  INP   LOCATION:  6738                         FACILITY:  [facility]   PHYSICIAN:  Altha Harm, MDDATE OF BIRTH:  03-25-1961   DATE OF ADMISSION:  12/03/2008  DATE OF DISCHARGE:                               DISCHARGE SUMMARY   DISCHARGE DISPOSITION:  Home.   DISCHARGE DIAGNOSES:  1. 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  2. Acute obstructive uropathy status post ureteral stent placement.  3. Methicillin-sensitive staphylococcus aureus bacteremia.  4. Pyelonephritis.  5. Acute renal failure, resolving.  6. Hypertension.  7. Human immunodeficiency virus nephropathy.  8. Secondary hyperparathyroidism.  9. Chronic back pain.  10.History of gallstones.  11.History of methicillin-resistant staphylococcus aureus in the past,      multiple infections.   DISCHARGE MEDICATIONS:  Please note the medication list will need to be  modified at the time of discharge.  However, currently, the patient is  on:  1. Diflucan 100 mg p.o. daily.  2. Labetalol 200 mg p.o. b.i.d.  3. Azithromycin 1200 mg p.o. weekly.  4. Ancef 1 g IV piggyback daily for an additional 6 days (please note      that the patient is presently on antiretroviral therapy.  However,      in discussion with Dr. Orvan Falconer, the infectious disease specialist,      his feeling is that the patient will have to discontinue      antiretroviral therapy prior to discharge from the hospital due to      his inability to obtain medications as an outpatient and in order      for him to likely be a candidate for any study drug as an      outpatient).   CONSULTANTS:  1. Dr. Daphine Deutscher, nephrology.  2. Dr. Cliffton Asters, infectious disease.  3. Interventional radiology.   PROCEDURES:  1. Placement of bilateral nephrostomy tube.  2. Left nephrostomy exchange with a left double-J ureteral stent      placed by interventional radiology on March 12.  3. PICC line placement, ultrasound-guided, done on March 15.   DIAGNOSTIC STUDIES:  1. Chest x-ray, two-view done on admission, which shows no acute      cardiopulmonary findings.  2. CT of the abdomen and pelvis without contrast done on admission      which shows 2 upper left ureteral calculi causing high-grade      obstruction.  Bilateral renal calculi.  Cholelithiasis.  Mild      diffuse bladder wall thickening.  Left inguinal hernia.  3. X-ray of the right hand which shows soft tissue swelling without      the osseous abnormality.  4. X-ray of the left wrist which shows soft tissue swelling without      acute osseous abnormality.  5. MRI of the lumbar spine which shows no evidence of infection.      Multilevel degenerative changes.  L4-L5 disk bulge eccentric right      with broad annular tear.  This does appear to about the left lower  extrapyramidal region and may contribute to L4 symptoms.  L3 disk      bulge with right-sided annular tear.; no mass effect.  L5-S1      advanced degenerative disk disease.   CODE STATUS:  Full code.   ALLERGIES:  1. CODEINE  2. SULFA.  3. LATEX.   PRIMARY CARE PHYSICIAN:  Unassigned.   CHIEF COMPLAINT:  Abdominal pain, dysuria.   HISTORY OF PRESENT ILLNESS:  Please refer to the H and P dictated on  December 03, 2008, for details of the HPI.   HOSPITAL COURSE:  1. Urethral obstruction with hydronephrosis.  The patient was found to      have urethral obstruction with hydronephrosis.  He had a      nephrostomy placed which relieved hydronephrosis.  Interventional      radiology subsequently placed a double-J stent secondary to the      urethral and renal calculi that were found there.  The patient will      need to follow-up with urology for definitive treatment of his      ureteral calculi.  2. Acute renal failure.  The patient presented to the hospital with      creatinine of 7.7.   There was some concern that this might be more      than just obstruction secondary to the hydronephrosis, as when the      stents were placed the patient did not have a requisite and      consummated resolution of his acute renal failure.  However, at      this time, the patient's renal function has improved down to 2.12.      There is some thought that the patient may actually have an HIV      nephropathy occurring as he has had greater than 300 grams of      protein in the urine.  A 24-hour urine was performed by nephrology      which confirmed high urine protein.  The patient will need to      follow up with nephrology as an outpatient with Dr. Hyman Hopes 2 weeks      post discharge.  3. HIV/AIDS.  The patient had a presentation with elevated liver      enzymes including elevated AST and LDH.  Subsequent HIV screening      test was positive, confirmed by Western blot.  The patient was      found to have a CD-4 count very low at 10, and infectious diseases      were consulted.  Decision was made to start antiretroviral therapy      while in the hospital.  The patient did have an improvement in his      renal function when the antiretroviral therapy was started.      However, the patient is without resources to obtain his      antiretroviral therapy as an outpatient, and Dr. Orvan Falconer, the ID      specialist, was concerned about the patient developing resistance      if unable to obtain his medications as an outpatient.  He feels      that it would probably be prudent to stop antiretroviral therapy at      the time of discharge and then to resume when the patient is seen      in the clinic.  Presently, the social worker in the ID office, Ms.      Rosealee Albee, is working  on possibilities for the patient to obtain      medications.  She is to call the patient in the morning to start      review access for medication assistance programs.  4. MSSA bacteremia.  The patient was found on blood  culture to have      MSSA bacteremia.  He was initially started on vancomycin and      transitioned over to Ancef.  The patient is to continue for an      additional 6 days of Ancef to complete a total of 14 days of      antibiotics.  5. Hypertension.  The patient was noted be hypertensive and was      started on labetalol.  Currently is on labetalol 200 mg p.o. b.i.d.      Will need further titration of his medications as an outpatient.  6. Poly arthropathy.  The patient had some poly arthropathy and was      felt to be gout.  However, in retrospect, I do not believe that      this is gout and rather was associated with his HIV diagnosis.      Gouty medications were discontinued, and the patient was treated      with antibiotics.  The arthropathies have resolved.  X-rays were      done on the joints but did not show any evidence of osteomyelitis.      There is no need for prolonged antibiotics of 6 weeks.   This brings the patient's hospital course up to date to this point.  An  addendum will be dictated at time of discharge including discharge  medications and any further decisions made about this patient.      Altha Harm, MD  Electronically Signed     MAM/MEDQ  D:  12/11/2008  T:  12/11/2008  Job:  [phone] [id number]

## 2011-02-10 NOTE — Consult Note (Signed)
NAME:  Patrick [name], Patrick [name] NO.:  [id number]   MEDICAL RECORD NO.:  [id number]          PATIENT TYPE:  INP   LOCATION:                               FACILITY:  [facility]   PHYSICIAN:  Garnetta Buddy, M.D.   DATE OF BIRTH:  01-17-1961   DATE OF CONSULTATION:  12/04/2008  DATE OF DISCHARGE:                                 CONSULTATION   REFERRING PHYSICIAN:  Hind IEda Paschal, MD   REASON FOR CONSULTATION:  Renal failure, in the setting of acute  obstruction.   HISTORY OF PRESENT ILLNESS:  Mr. Swallows is a 50 year old gentleman  with a past medical history of nephrolithiasis in 1999, status post  lithotripsy who presents to the emergency department complaining of 5-  day history of abdominal pain, dysuria, fevers, and loose watery stools.  His urinary symptoms became progressively worse throughout this time,  although he denies any nausea or vomiting or inability to tolerate oral  intake.  States that he has been taking over-the-counter Motrin    Dictation ended at this point.      Lucy Antigua, MD  Electronically Signed      Garnetta Buddy, M.D.  Electronically Signed    RK/MEDQ  D:  12/04/2008  T:  12/05/2008  Job:  [id number]

## 2011-02-10 NOTE — Consult Note (Signed)
NAME:  Patrick [name], Patrick [name] NO.:  [id number]   MEDICAL RECORD NO.:  [id number]          PATIENT TYPE:  INP   LOCATION:  0454                         FACILITY:  [facility]   PHYSICIAN:  Valetta Fuller, M.D.  DATE OF BIRTH:  [birth year]   DATE OF CONSULTATION:  DATE OF DISCHARGE:                                 CONSULTATION   REASON FOR CONSULTATION:  Left proximal ureteral stone with significant  obstruction and acute renal failure.   HISTORY OF PRESENT ILLNESS:  Patrick [name] has a previous history of  nephrolithiasis.  He underwent lithotripsy in 1999 by Dr. Ihor Gully.  I was unable to find any records from his previous urologic care within  or on system.  The patient tells me he has had no further episodes of  nephrolithiasis until recently.  The patient presented to the [facility]  ER on December 03, 2008 with a chief complaint of significant abdominal  discomfort.  The patient was having pain in the left side of his abdomen  along with some flank discomfort.  The patient has had some intermittent  pain for 7-10 days.  He has also had nausea as well as multiple episodes  of diarrhea.  The patient has had interestingly increased urinary output  with urinary urgency and some urge incontinence.  When the patient  presented to the emergency room he underwent assessment which included  CT of his abdomen and pelvis.  This revealed two upper left ureteral  calculi causing high-grade obstruction.  The largest was approximately 7  x 7 mm.  There were also bilateral renal calculi but no evidence of any  obstruction on the right side.  The bladder did not show any gross  distention.  The patient's pain was relatively well managed.  Surprisingly, his creatinine was markedly elevated at 7.4 despite the  unilateral obstruction.  The patient's potassium however was normal.  Urinalysis showed a small degree of microhematuria with really minimal  pyuria with only a few white blood cells.   The patient was admitted.  Dr. Annabell Howells he was on call last evening was contacted about the patient  but did not know the patient's location and did not see him today.  We  got a Pulmonology consult today and therefore have come to see [name].  The patient is status post placement of a left percutaneous  nephrostomy tube without apparent complication.  He is currently having  no significant pain and just some mild soreness.   PAST MEDICAL HISTORY:  Really otherwise relatively unremarkable.  The  patient does have a prior history of lithotripsy.  He also had a MRSA  abscess several years ago.  The patient also had an appendectomy.   The patient takes no regular medications but recently has been taking  some Motrin.  He does take a variety of herbals and over-the-counter  supplements.   ALLERGIES:  The patient has allergies to SULFA, CODEINE and has a  questionable LATEX ALLERGY.   The patient does not smoke or drink.   FAMILY HISTORY:  Otherwise, noncontributory.   REVIEW OF SYSTEMS:  Notable for some abdominal and back discomfort along  with increased urinary urgency, nausea, vomiting and diarrhea.  He has  had no gross hematuria.   PHYSICAL EXAMINATION:  GENERAL:  He is a well-developed, well-nourished  male.  VITAL SIGNS:  He was currently afebrile with a pulse of 102.  Blood  pressure was 118/74.  Respiratory rate was 22 and unlabored.  NECK:  No  obvious JVD or masses.  The patient's respiratory effort was normal.  HEART:  Regular rate and rhythm.  ABDOMEN:  Soft.  No real tenderness.  A nephrostomy tube on the left  side with mild left flank discomfort status post procedure.  EXTREMITIES:  No obvious edema.  NEURO:  Nonfocal.  SKIN:  Warm and dry.   DATA:  Creatinine 7.5, potassium 4.0, white blood cell count within  normal limits.   ASSESSMENT:  High-grade left renal obstruction with multiple proximal  renal calculi.  The patient clinically does not appear  uroseptic but has  been febrile and apparently recent blood cultures were positive for Gram-  positive cocci.  He has had appropriate initial management with  placement of a percutaneous nephrostomy tube.  What remains unclear is  why the patient has acute renal failure of this degree.  One would  certainly not expect this based on unilateral obstruction.  I suspect he  has either severe ATN or some underlying chronic renal disease that was  previously unrecognized.  His nonsteroidal anti-inflammatory use may  also be contributing factor.  At this point, there is no need for any  additional urologic intervention.  The patient will be followed by Dr.  Annabell Howells from this point onward.  The patient will eventually need  internalization of his nephrostomy tube to a double-J stent with  subsequent definitive management of stone once the infectious issues and  renal function issues have hopefully resolved.      Valetta Fuller, M.D.  Electronically Signed     DSG/MEDQ  D:  12/06/2008  T:  12/07/2008  Job:  [id number]   cc:   Garnetta Buddy, M.D.

## 2011-02-10 NOTE — Consult Note (Signed)
NAME:  Patrick [name], TAUZIN NO.:  [id number]   MEDICAL RECORD NO.:  [id number]          PATIENT TYPE:  INP   LOCATION:  6738                         FACILITY:  [facility]   PHYSICIAN:  Garnetta Buddy, M.D.   DATE OF BIRTH:  1960-11-29   DATE OF CONSULTATION:  DATE OF DISCHARGE:                                 CONSULTATION   ADDENDUM   HISTORY OF PRESENT ILLNESS:  The patient states that he has been taking  Motrin tablets for the past week in attempts to control his abdominal  pain, but denies any other use of any other NSAIDs.  He states that he  cannot recall exactly how many Motrin tablets he took, but states that  it was more than 6-8.  The patient denies any other history of kidney  disease besides his prior episode of nephrolithiasis.  He has not  started any new medications recently.  Denies any chest pain, shortness  of breath, headache, melena, or bright red blood per rectum.  He states  that he has some joint pain in his left wrist and right knuckles  for  the past 3 days associated with some erythema and mild swelling.   PAST MEDICAL HISTORY:  1. Status post appendectomy in 2001.  2. Status post lithotripsy in 1999 for kidney stones.  3. History of MRSA abscess of left knee in 2008.   ALLERGIES:  The patient has allergies to SULFA, CODEINE, and LATEX.  All  of these cause a rash.   MEDICATIONS:  The patient does not take any prescription medications,  but takes herbal supplements including flaxseed oil, Omega-3 fatty  acids, grape seed extract, chlorophyll.  He has been taking Tylenol and  Motrin over-the-counter as needed for his pain for the past week.   SOCIAL HISTORY:  The patient is single and does not have children.  He  denies any tobacco or recreational drug use.  Admits to social alcohol  use.   FAMILY HISTORY:  Noncontributory as the patient is adopted.   REVIEW OF SYSTEMS:  As per HPI.   PHYSICAL EXAMINATION:  VITAL SIGNS:  Temperature of  100.4, pulse of 105,  respiratory rate of 20, blood pressure 135/78, oxygen saturation of 96%  on room air.  GENERAL:  In no acute distress.  Alert and oriented x3.  HEENT:  Sclerae are anicteric bilaterally.  Extraocular motions are  intact.  Moist mucous membranes.  Oropharynx is clear without erythema  or exudates.  NECK:  Supple.  CARDIOVASCULAR:  Tachycardia but regular rhythm.  No murmurs, rubs, or  gallops.  LUNGS:  Clear to auscultation bilaterally.  GI:  Positive bowel sounds.  Soft, nontender abdomen with no rebound or  guarding and no masses noted.  EXTREMITIES:  No clubbing, cyanosis, or edema.  NEUROLOGIC:  Alert and oriented x3.  Cranial nerves II through XII are  intact.  Otherwise, grossly nonfocal.   LABORATORY DATA:  A chest x-ray, which shows no acute findings.  CT of  the abdomen and pelvis without contrast showed 2 left ureteral calculi  causing high-grade obstruction.  There was bilateral  renal calculi.  Cholelithiasis without evidence of cholecystitis was also noted.  White  blood cell count was 8.2, hemoglobin 11.9, and platelets 116.  Sodium  131, potassium 4.0, chloride 94, bicarb 23, BUN 86, creatinine 7.4,  glucose 139, calcium 8.0, magnesium 2.9, phosphorus 3.3, total bilirubin  4.1, AST 85, ALT 78, alkaline phosphatase 134, albumin 2.1.  Uric acid  was 12.5, hemoglobin A1c 6.5, TSH 1.63, ESR 125, INR 1.2, PT 15.5.  Blood and urine cultures are currently pending.  Urinalysis showed 300  protein, 3-6 white blood cells, 11-20 red blood cells, negative  nitrites, trace leukocytes.  Intact parathyroid hormone is pending.  Fractional excretion of sodium was calculated to be 1.22%.   ASSESSMENT AND PLAN:  Acute renal failure in the setting of obstructive  uropathy and NSAID use.  Although, the patient has no baseline  creatinine on record to compare with, his renal failure is likely acute  in nature.  However, with greater than 300 protein noted on  urinalysis  and the fact that unilateral obstruction is unlikely to cause such  severe acute renal failure, the patient may have the component of  chronic kidney disease.  With no known history of diabetes or  hypertension, the patient's renal failure may be exacerbated by his  NSAID use causing possible membranous or minimal change glomerulopathy,  chronic interstitial nephritis secondary to NSAIDs.  Acute interstitial  nephritis or acute tubular necrosis is also on the differential.  I  agree with aggressive IV hydration, pain control, IV broad-spectrum  antibiotics as you are currently doing.  However, before further workup,  the patient's proteinuria can be performed, first we will need to  relieve acute obstruction.  We will keep the patient n.p.o. and have  Interventional Radiology performed percutaneous nephrostomy tonight.  Also continued to hold all NSAIDs.  We will continue to follow the  patient with you.   Thank you for the consult.      Lucy Antigua, MD  Electronically Signed      Garnetta Buddy, M.D.  Electronically Signed    RK/MEDQ  D:  12/04/2008  T:  12/05/2008  Job:  [id number]

## 2011-02-10 NOTE — Op Note (Signed)
NAME:  Patrick [name], Patrick [name]             ACCOUNT NO.:  [id number]   MEDICAL RECORD NO.:  [id number]          PATIENT TYPE:  AMB   LOCATION:  DAY                          FACILITY:  [facility]   PHYSICIAN:  Mark C. Vernie Ammons, M.D.  DATE OF BIRTH:  Mar 11, 1961   DATE OF PROCEDURE:  02/11/2009  DATE OF DISCHARGE:                               OPERATIVE REPORT   PREOPERATIVE DIAGNOSIS:  Left renal calculi.   POSTOPERATIVE DIAGNOSIS:  Left renal calculi.   PROCEDURE:  1. Cystoscopy with left double-J stent removal.  2. Left retrograde pyelogram with interpretation.  3. Left ureteroscopy with stone extraction.  4. Left Polaris stent replacement (5-French, 24 cm, string).   SURGEON:  Mark C. Vernie Ammons, M.D.   ANESTHESIA:  General.   SPECIMENS:  Stone given to the patient.   BLOOD LOSS:  Minimal.   COMPLICATIONS:  None.   INDICATIONS:  The patient is a 50 year old male who had an obstructing  left upper ureteral calculus and required admission to the hospital for  that.  He had a percutaneous nephrostomy access obtained and after the  kidney was drained and his pyelonephritis was treated, a double-J stent  was then passed into the bladder and the nephrostomy tube removed.  His  stone migrated back up into the kidney on preoperative KUB and we  discussed the treatment options.  I felt that ureteroscopic extraction  of the stone was the best and most appropriate way to treat all of the  stone in his kidney and the patient understands the procedure which was  discussed in detail as well as the risks, complications and  alternatives.  He has elected to proceed.   DESCRIPTION OF OPERATION:  After informed consent, the patient was  brought to the major OR, placed on table, administered general  anesthesia and given 400 mg of Cipro IV.  His genitalia was sterilely  prepped and draped and official time-out was performed.  Throughout the  entire procedure, latex allergy precautions were followed.   Initially the 21-French cystoscope was passed down the urethra with a 12  degrees lens under direct vision and the urethra was noted to be normal.  The prostatic urethra was without lesion and the bladder was entered.  No tumor, stones or inflammatory lesions were seen.  There was some  inflammation surrounding the left ureteral orifice where the stent was  present.  The stent was then grasped with alligator forceps and  extracted through the tip of the penis and a 0.038 inch floppy tip  guidewire was then passed up the stent and then up the ureter under  direct fluoroscopic control into the area of the renal pelvis.  The  stent was then removed and I placed a ureteral access sheath over the  guidewire which passed very easily up into the area of the renal pelvis  and the guidewire and inner portion of the access sheath were then  removed.   The 6 [place] digital flexible ureteroscope was then passed through the  access sheath into the area of the kidney.  Each calix was individually  inspected and I noted  multiple stones in the lower pole.  In order to  make sure I was visualizing all calyces a retrograde pyelogram was  performed.   Retrograde pyelogram was performed by injecting full strength contrast  through the ureteroscope into the area of the renal pelvis.  Filling  defects were seen in the lower pole consistent with his stones.  No  other filling defects were identified.   A nitinol basket was then passed through the ureteroscope and I grasped  one of several stones and extracted that through the access sheath.  I  then reinserted the ureteroscope, grasped the second stone which  appeared to be the largest and it would not fit through the access  sheath so I brought the stone up against the opening of the access  sheath and extracted the access sheath with the stone in that location  without any resistance whatsoever.  I then reinserted the ureteroscope  and visually passed it  up the ureter without any resistance or  difficulty whatsoever.  I was able to extract a total of five stones  from his kidney and no other stones were identified within the kidney.  I therefore passed a guidewire through the ureteroscope into the area of  the renal pelvis and removed the ureteroscope.   The cystoscope was back loaded over the guidewire and the stent was  passed over the guidewire and passed into the area of the renal pelvis  where the guidewire was removed and good curl was noted in the renal  pelvis.  The bladder was then drained and the cystoscope removed.  The  string affixed to the distal portion of the stent was then affixed to  the dorsum of the penis and the patient was awakened and taken to the  recovery room in stable and satisfactory condition.  He tolerated the  procedure well.  There were no intraoperative complications.   He will be given a prescription for 18 Vicodin for pain and was given a  prescription for Pyridium 200 mg #18.  He will follow-up in my office  later this week for his stent removal.  He was given the stones.      Mark C. Vernie Ammons, M.D.  Electronically Signed     MCO/MEDQ  D:  02/11/2009  T:  02/11/2009  Job:  [id number]

## 2011-02-13 NOTE — Discharge Summary (Signed)
[facility]  Patient:    Patrick [name], Patrick [name]                    MRN: [id number] Adm. Date:  [id number] Disc. Date: [id number] Attending:  Stephenie Acres                           Discharge Summary  ADMISSION DIAGNOSIS:  Ruptured appendicitis.  DISCHARGE DIAGNOSIS:  Ruptured appendicitis.  CONDITION ON DISCHARGE:  Good and improved.  BRIEF HISTORY OF PRESENT ILLNESS:  The patient is a 50 year old white male who presented to the [facility] Emergency Room one day prior to presenting to my office. He had a one-week history of vague abdominal discomfort particularly in the right lower quadrant and right upper abdomen.  He was diagnosed with possible gastroenteritis and sent home.  The patient returned even later with continued abdominal discomfort and low grade fever.  His pain was slightly increased and temperature was in the 100-101 range.  He was taking good liquid p.o., but otherwise was anorexic.  CT scan showed a 9.2 cm fluid collection which was well circumscribed in the paracolic gutter extending from the right retrocecal aspect to the right lobe of the liver.  The patient now follows up in my office.  His white count in the emergency room was 10,000.  HOSPITAL COURSE:  The patient was admitted with a diagnosis of ruptured appendicitis, for IV antibiotics and probable interval appendectomy.  His temperatures remained 100-102.  White count was down to 10,000.  Sugars were mildly elevated in the 250 range.  He was continued on IV fluids and IV antibiotics. e was also found to have cholelithiasis.  He was continued on maintenance antibiotics for 48 hours and underwent repeat CT scan on the second hospital day.  There was to be an attempted percutaneous drainage, however, this appeared more to be phlegmatous in nature rather than a true abscess cavity.  Therefore, percutaneous drainage was not undertaken.  Temperatures remained down.  His  appetite was slowly improving.  He was continued on IV antibiotics for an additional 24 hours.  On he following day he had a placement of a 20 French drainage catheter by Dr. Jennye Boroughs, invasive radiology.  He had a large amount of purulent drainage.  Over he next 48 hours the patients temperature spiked to 103.  He continued with chills. Was maintained on antibiotics.  Wound culture showed gram-negative rods and gram-positive cocci.  On hospital day #5, the patient had significant erythema develop over his right flank, extending onto his right abdomen.  For this reason he was prepared for the operating room for laparotomy and drainage of intra-abdominal abscess.  On February 4 he went to the operating room, underwent ruptured appendectomy and extensive drainage of intra-abdominal abscess with placement of drainage tubes.  Over the next 48 hours he continued to spike temperatures in the 102-103 range nd his antibiotics were changed to Primaxin.  Temperatures then began to decrease o 101.  His ileus took 3-4 days to resolve.  The JP was left in place.  He was changed to p.o. antibiotics, watched for 24 hours, remained afebrile, and was ready for discharge home on postoperative day #6.  He also was requiring twice daily dressing changes and Home Health care was arranged.  DISCHARGE MEDICATIONS:  1. Vicodin for pain.  2. Augmentin 875 mg p.o. b.i.d.  3. Diflucan 100 mg p.o. q.d.  Home Health care arrangements include wet-to-dry dressing changes twice daily.  FOLLOWUP:  Follow up with me in 5-7 days in the office.  Of note, the patient is referred to Dr. Darryll Capers for further followup of his hyperglycemia intraoperatively and will need followup after the acute phase of his disease is resolved. DD:  12/17/99 TD:  12/17/99 Job: [id number]

## 2011-02-18 ENCOUNTER — Other Ambulatory Visit (INDEPENDENT_AMBULATORY_CARE_PROVIDER_SITE_OTHER): Payer: Medicaid Other

## 2011-02-18 DIAGNOSIS — B20 Human immunodeficiency virus [HIV] disease: Secondary | ICD-10-CM

## 2011-02-18 DIAGNOSIS — Z113 Encounter for screening for infections with a predominantly sexual mode of transmission: Secondary | ICD-10-CM

## 2011-02-18 DIAGNOSIS — Z79899 Other long term (current) drug therapy: Secondary | ICD-10-CM

## 2011-02-19 LAB — T-HELPER CELL (CD4) - (RCID CLINIC ONLY): CD4 % Helper T Cell: 15 % — ABNORMAL LOW (ref 33–55)

## 2011-02-19 LAB — LIPID PANEL
Cholesterol: 165 mg/dL (ref 0–200)
HDL: 24 mg/dL — ABNORMAL LOW (ref >39)
LDL Cholesterol: 81 mg/dL (ref 0–99)
Triglycerides: 300 mg/dL — ABNORMAL HIGH (ref <150)

## 2011-02-19 LAB — CBC WITH DIFFERENTIAL/PLATELET
Basophils Absolute: 0 10*3/uL (ref 0.0–0.1)
Basophils Relative: 0 % (ref 0–1)
Eosinophils Absolute: 0.2 10*3/uL (ref 0.0–0.7)
HCT: 32.9 % — ABNORMAL LOW (ref 39.0–52.0)
Hemoglobin: 10.5 g/dL — ABNORMAL LOW (ref 13.0–17.0)
MCH: 29.9 pg (ref 26.0–34.0)
MCHC: 31.9 g/dL (ref 30.0–36.0)
Monocytes Absolute: 0.4 10*3/uL (ref 0.1–1.0)
Monocytes Relative: 7 % (ref 3–12)
Neutro Abs: 2.8 10*3/uL (ref 1.7–7.7)
RDW: 16.9 % — ABNORMAL HIGH (ref 11.5–15.5)

## 2011-02-19 LAB — COMPLETE METABOLIC PANEL WITH GFR
AST: 11 U/L (ref 0–37)
Alkaline Phosphatase: 77 U/L (ref 39–117)
BUN: 16 mg/dL (ref 6–23)
Creat: 1.24 mg/dL (ref 0.40–1.50)
GFR, Est Non African American: >60 mL/min (ref >60)
Glucose, Bld: 136 mg/dL — ABNORMAL HIGH (ref 70–99)
Potassium: 4.5 mEq/L (ref 3.5–5.3)
Total Bilirubin: 0.4 mg/dL (ref 0.3–1.2)

## 2011-02-19 LAB — HIV-1 RNA QUANT-NO REFLEX-BLD
HIV 1 RNA Quant: <20 copies/mL (ref <20)
HIV-1 RNA Quant, Log: <1.3 {Log} (ref <1.30)

## 2011-02-25 ENCOUNTER — Encounter: Payer: Self-pay | Admitting: Internal Medicine

## 2011-03-10 ENCOUNTER — Ambulatory Visit (INDEPENDENT_AMBULATORY_CARE_PROVIDER_SITE_OTHER): Payer: Medicaid Other | Admitting: Internal Medicine

## 2011-03-10 ENCOUNTER — Encounter: Payer: Self-pay | Admitting: Internal Medicine

## 2011-03-10 DIAGNOSIS — R7309 Other abnormal glucose: Secondary | ICD-10-CM

## 2011-03-10 DIAGNOSIS — I10 Essential (primary) hypertension: Secondary | ICD-10-CM

## 2011-03-10 DIAGNOSIS — E785 Hyperlipidemia, unspecified: Secondary | ICD-10-CM

## 2011-03-10 DIAGNOSIS — B20 Human immunodeficiency virus [HIV] disease: Secondary | ICD-10-CM

## 2011-03-10 DIAGNOSIS — L259 Unspecified contact dermatitis, unspecified cause: Secondary | ICD-10-CM

## 2011-03-10 DIAGNOSIS — L309 Dermatitis, unspecified: Secondary | ICD-10-CM

## 2011-03-10 LAB — HEMOGLOBIN A1C
Hgb A1c MFr Bld: 5.6 % (ref <5.7)
Mean Plasma Glucose: 114 mg/dL (ref <117)

## 2011-03-10 MED ORDER — EFAVIRENZ-EMTRICITAB-TENOFOVIR 600-200-300 MG PO TABS: 1.0000 | ORAL_TABLET | Freq: Every day | ORAL | Status: DC

## 2011-03-10 NOTE — Assessment & Plan Note (Signed)
His HIV infection is improving with a significant bump up in his CD4 count. His renal function has returned to normal so I will simplify the regimen back to Atripla.

## 2011-03-10 NOTE — Progress Notes (Signed)
  Subjective:    Patient ID: Patrick [name], male    DOB: 10-23-1960, 50 y.o.   MRN: [id number]  HPI Patrick [name] is in for his routine visit today. The pharmacy was late refilling his blood pressure medication so he missed his dose yesterday and today. Otherwise, he has not missed any doses of his medications specifically his HIV medicines. His rash on his ankle is unchanged by the triamcinolone ointment. Overall he is better but he remains a little more tired than he was earlier this year.    Review of Systems     Objective:   Physical Exam  Constitutional: No distress.  HENT:  Mouth/Throat: Oropharynx is clear and moist. No oropharyngeal exudate.  Cardiovascular: Normal rate and normal heart sounds.   No murmur heard. Pulmonary/Chest: Breath sounds normal. He has no wheezes. He has no rales.  Skin:       He has a chronic appearing, scaly rash on his ankle that is unchanged.  Psychiatric: He has a normal mood and affect.          Assessment & Plan:

## 2011-03-10 NOTE — Assessment & Plan Note (Signed)
His triglycerides have decreased to 300. I talked to him again about dietary modification.

## 2011-03-10 NOTE — Assessment & Plan Note (Signed)
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

## 2011-03-10 NOTE — Assessment & Plan Note (Signed)
I will check a hemoglobin A1c today. 

## 2011-03-10 NOTE — Assessment & Plan Note (Signed)
His blood pressures not yet at goal but this may be related to being off of his medications the past 2 days.

## 2011-03-11 LAB — TESTOSTERONE, FREE, TOTAL, SHBG
Sex Hormone Binding: 24 nmol/L (ref 13–71)
Testosterone, Free: 40.3 pg/mL — ABNORMAL LOW (ref 47.0–244.0)

## 2011-03-27 ENCOUNTER — Ambulatory Visit (INDEPENDENT_AMBULATORY_CARE_PROVIDER_SITE_OTHER): Payer: Medicaid Other | Admitting: Adult Health

## 2011-03-27 ENCOUNTER — Other Ambulatory Visit: Payer: Self-pay | Admitting: Licensed Clinical Social Worker

## 2011-03-27 VITALS — Temp 98.2°F | Wt 226.0 lb

## 2011-03-27 DIAGNOSIS — R600 Localized edema: Secondary | ICD-10-CM

## 2011-03-27 DIAGNOSIS — L259 Unspecified contact dermatitis, unspecified cause: Secondary | ICD-10-CM

## 2011-03-27 DIAGNOSIS — Z87448 Personal history of other diseases of urinary system: Secondary | ICD-10-CM

## 2011-03-27 DIAGNOSIS — R609 Edema, unspecified: Secondary | ICD-10-CM

## 2011-03-27 DIAGNOSIS — L309 Dermatitis, unspecified: Secondary | ICD-10-CM

## 2011-03-27 DIAGNOSIS — E291 Testicular hypofunction: Secondary | ICD-10-CM

## 2011-03-27 LAB — CBC WITH DIFFERENTIAL/PLATELET
Basophils Relative: 0 % (ref 0–1)
Eosinophils Absolute: 0.1 10*3/uL (ref 0.0–0.7)
Eosinophils Relative: 2 % (ref 0–5)
Lymphs Abs: 2.2 10*3/uL (ref 0.7–4.0)
MCH: 28.5 pg (ref 26.0–34.0)
MCHC: 31.1 g/dL (ref 30.0–36.0)
MCV: 91.5 fL (ref 78.0–100.0)
Monocytes Relative: 9 % (ref 3–12)
Neutrophils Relative %: 53 % (ref 43–77)
Platelets: 288 10*3/uL (ref 150–400)
RBC: 3.3 MIL/uL — ABNORMAL LOW (ref 4.22–5.81)

## 2011-03-27 MED ORDER — KETOCONAZOLE 2 % EX CREA: TOPICAL_CREAM | Freq: Two times a day (BID) | CUTANEOUS | Status: DC

## 2011-03-27 NOTE — Patient Instructions (Signed)
When the swelling develops to lower extremities, rest, and elevate legs. Recommend obtaining, knee-high support stockings especially when up on feet for extended period of time.

## 2011-03-27 NOTE — Progress Notes (Signed)
Subjective:    Patient ID: Patrick [name], male    DOB: 09-Aug-1961, 50 y.o.   MRN: [id number]  HPI He presents to clinic today with a chief complaint progressively worsening swelling to his lower extremities over the past week, as well as some enlarged lymph nodes in the groin. He denies having any fevers, chills, headaches, cough, or shortness of breath. He does relate that before many of the symptoms developed. He was out in the sun, and in spite of using a high SPF sunscreen, he still had a sun burn across the abdomen and on his lower legs. He also relates that the rash that he has had on his ankles. Still persists, with ongoing, pruritus.and the cream Dr. Orvan Falconer prescribed for him still has not helped.   Review of Systems  Constitutional: Positive for activity change. Negative for fever, chills, diaphoresis, appetite change, fatigue and unexpected weight change.  HENT: Negative.   Eyes: Negative.   Respiratory: Negative for cough, shortness of breath, wheezing and stridor.   Cardiovascular: Positive for leg swelling. Negative for chest pain and palpitations.  Gastrointestinal: Positive for nausea. Negative for vomiting, abdominal pain, diarrhea, constipation, blood in stool, abdominal distention, anal bleeding and rectal pain.  Genitourinary: Negative.   Musculoskeletal: Negative.   Skin: Positive for color change and rash.  Neurological: Negative.   Hematological: Positive for adenopathy.  Psychiatric/Behavioral: Negative.        Objective:   Physical Exam  Constitutional: He is oriented to person, place, and time. He appears well-developed and well-nourished. No distress.  HENT:  Head: Normocephalic and atraumatic.  Right Ear: External ear normal.  Left Ear: External ear normal.  Nose: Nose normal.  Mouth/Throat: Oropharynx is clear and moist.  Eyes: Conjunctivae and EOM are normal. Pupils are equal, round, and reactive to light.  Neck: Normal range of motion. Neck  supple.  Cardiovascular: Normal rate, regular rhythm, normal heart sounds and intact distal pulses.   Pulmonary/Chest: Effort normal and breath sounds normal.  Abdominal: Soft. Bowel sounds are normal. He exhibits no distension. There is no tenderness.  Musculoskeletal: Normal range of motion. He exhibits edema. He exhibits no tenderness.       2+ pitting edema noted bilaterally to lower legs both pretibially, and dorsal surface of feet  Lymphadenopathy:    He has no cervical adenopathy.       Right: Inguinal adenopathy present.       Left: Inguinal adenopathy present.       Inguinal lymph nodes appear, firm and rubbery, nontender, with no discoloration noted to the region.  Neurological: He is alert and oriented to person, place, and time. He has normal reflexes. No cranial nerve deficit. He exhibits normal muscle tone. Coordination normal.  Skin:       First degree sunburn noted to lower legs, abdomen, and arms.  Psychiatric: He has a normal mood and affect. His behavior is normal. Judgment and thought content normal.          Assessment & Plan:  1. Leg edema. While in most circumstances this might be associated with his sunburn, extreme weather  (heat), or of being on his feet for extended periods of time (which he does describe) , his history of renal failure, and associated problems over the past 6 months warrants [place] to evaluate other metabolic problems. Therefore, we'll obtain a CBC, CMP, and UA today. We will ask that he keep his legs elevated during times when they are swollen and that he  purchase support hose especially to be worn during times when he is on his feet. For extended periods of time.  2. Inguinal lymphadenopathy. This may be associated again with a sun burn and with weather change, and because the presentation appears to shotty in nature, we should continue to monitor this. If they become larger or tender, he should be referred for lymph node biopsy.  3. Rash on feet.  Not responding to steroid creams alone. We will add ketoconazole 2% cream to apply to the area twice a day.  4. Hypogonadism. Serum testosterone levels from 03/10/2011 show a total testosterone level of 179.58 with free testosterone of only 40.3. Both below the limits of normal. Given his current problems, we should wait until we see his labs before we make dosing for testosterone replacement therapy. Therefore, we will ask that he return in 2 weeks to see Dr. Orvan Falconer, review lab results with him, and plan for replacement therapy.  He verbally acknowledged all information that was provided to him and agreed with plan of care

## 2011-03-28 LAB — COMPLETE METABOLIC PANEL WITH GFR
Alkaline Phosphatase: 74 U/L (ref 39–117)
CO2: 26 mEq/L (ref 19–32)
Creat: 1.34 mg/dL (ref 0.50–1.35)
GFR, Est African American: >60 mL/min (ref >60)
GFR, Est Non African American: 57 mL/min — ABNORMAL LOW (ref >60)
Glucose, Bld: 190 mg/dL — ABNORMAL HIGH (ref 70–99)
Total Bilirubin: 0.3 mg/dL (ref 0.3–1.2)

## 2011-03-30 ENCOUNTER — Other Ambulatory Visit: Payer: Self-pay | Admitting: *Deleted

## 2011-03-30 DIAGNOSIS — Z87448 Personal history of other diseases of urinary system: Secondary | ICD-10-CM

## 2011-03-30 LAB — URINALYSIS, ROUTINE W REFLEX MICROSCOPIC
Leukocytes, UA: NEGATIVE
Nitrite: NEGATIVE
Specific Gravity, Urine: 1.021 (ref 1.005–1.030)
Urobilinogen, UA: 0.2 mg/dL (ref 0.0–1.0)
pH: 5.5 (ref 5.0–8.0)

## 2011-03-30 NOTE — Progress Notes (Signed)
Addended by: Mariea Clonts D on: 03/30/2011 11:17 AM   Modules accepted: Orders

## 2011-04-09 ENCOUNTER — Encounter: Payer: Self-pay | Admitting: Adult Health

## 2011-04-09 ENCOUNTER — Ambulatory Visit (INDEPENDENT_AMBULATORY_CARE_PROVIDER_SITE_OTHER): Payer: Medicaid Other | Admitting: Adult Health

## 2011-04-09 VITALS — BP 138/78 | HR 79 | Temp 98.2°F | Ht 72.0 in | Wt 226.0 lb

## 2011-04-09 DIAGNOSIS — R7309 Other abnormal glucose: Secondary | ICD-10-CM

## 2011-04-09 DIAGNOSIS — E291 Testicular hypofunction: Secondary | ICD-10-CM

## 2011-04-09 DIAGNOSIS — R739 Hyperglycemia, unspecified: Secondary | ICD-10-CM

## 2011-04-09 LAB — POCT GLYCOSYLATED HEMOGLOBIN (HGB A1C): Hemoglobin A1C: 5.4

## 2011-04-09 MED ORDER — TESTOSTERONE 50 MG/5GM (1%) TD GEL: 5.0000 g | Freq: Every day | TRANSDERMAL | Status: DC

## 2011-04-09 NOTE — Progress Notes (Signed)
  Subjective:    Patient ID: Patrick [name], male    DOB: 29-Jan-1961, 50 y.o.   MRN: [id number]  HPI [name] returns to clinic today for scheduled followup to his lower extremity edema. He was to be scheduled for followup with Dr. Orvan Falconer. However, an error in scheduling, placed him on my schedule. Instead. He states that the swelling to his lower extremities has remarkably improved. His only major complaint at this present time is still a low energy level not described as malaise or fatigue. Denies any polydipsia, polyuria, polyphagia, heat or cold intolerances.   Review of Systems  Constitutional: Negative.   HENT: Negative.   Eyes: Negative.   Respiratory: Negative.   Cardiovascular: Negative.   Gastrointestinal: Negative.   Genitourinary: Negative.   Musculoskeletal: Negative.   Skin: Negative.   Neurological: Negative.   Hematological: Negative.   Psychiatric/Behavioral: Negative.        Objective:   Physical Exam  Constitutional: He is oriented to person, place, and time. He appears well-developed.       Overweight appearing  HENT:  Head: Normocephalic and atraumatic.  Eyes: Conjunctivae and EOM are normal. Pupils are equal, round, and reactive to light.  Neck: Normal range of motion. Neck supple.  Cardiovascular: Normal rate and regular rhythm.   Pulmonary/Chest: Effort normal and breath sounds normal.  Abdominal: Soft. Bowel sounds are normal.  Musculoskeletal: Normal range of motion.       No edema noted to lower extremities as previously assessed.  Neurological: He is alert and oriented to person, place, and time.  Skin: Skin is warm and dry.       Rash cleared to lower extremities. Sunburn has healed.  Psychiatric: He has a normal mood and affect. His behavior is normal. Judgment and thought content normal.          Assessment & Plan:  1. Lower Extremity Edema. Etiology may have been having more than his sunburn, but given the circumstances, it may be  related to hypogonadal  function, hyperglycemia, or combination of factors. Continue recommending use of support hose, and we'll continue to monitor.  2. Hyperglycemia. A repeat hemoglobin A1c was 5.7, which is not consistent with an active case of type 2 diabetes. We discussed a carbohydrate-restricted diet and encouraged him to follow. This particular diet plan and fast for 8-10 hours before his next blood draw to determine a baseline serum glucose.  3. Hypogonadism. While we were going to defer this treatment to Dr. Orvan Falconer, our plan was to wait to review his labs and then have medications ordered. We will begin with AndroGel 50 mg/5 g, one pack applied to skin daily as directed. We will repeat his testosterone levels  prior to his next clinic followup.  He verbally acknowledged all information that was provided to him and he agreed with plan of care. Will review case with Dr. Orvan Falconer at our next opportunity.

## 2011-05-11 ENCOUNTER — Telehealth: Payer: Self-pay | Admitting: *Deleted

## 2011-05-11 NOTE — Telephone Encounter (Signed)
Pt scheduled appt with Dr. Orvan Falconer to f/u on lymph nodes.  RN called [facility] about appt with Dr. Coralee Pesa.  Pt scheduled for 06/02/11 @ 0930 w/ Dr. Coralee Pesa to start PCP care at [facility].  Jennet Maduro, RN

## 2011-05-12 ENCOUNTER — Encounter: Payer: Self-pay | Admitting: Internal Medicine

## 2011-05-12 ENCOUNTER — Ambulatory Visit (INDEPENDENT_AMBULATORY_CARE_PROVIDER_SITE_OTHER): Payer: Medicaid Other | Admitting: Internal Medicine

## 2011-05-12 VITALS — BP 126/73 | HR 87 | Temp 98.4°F | Ht 72.0 in | Wt 230.8 lb

## 2011-05-12 DIAGNOSIS — B20 Human immunodeficiency virus [HIV] disease: Secondary | ICD-10-CM

## 2011-05-12 LAB — COMPREHENSIVE METABOLIC PANEL
ALT: <8 U/L (ref 0–53)
AST: 7 U/L (ref 0–37)
Albumin: 3.8 g/dL (ref 3.5–5.2)
Calcium: 9.1 mg/dL (ref 8.4–10.5)
Chloride: 102 mEq/L (ref 96–112)
Potassium: 4.5 mEq/L (ref 3.5–5.3)
Sodium: 136 mEq/L (ref 135–145)

## 2011-05-12 LAB — CBC
MCV: 92.1 fL (ref 78.0–100.0)
Platelets: 237 10*3/uL (ref 150–400)
RDW: 15.3 % (ref 11.5–15.5)
WBC: 5.9 10*3/uL (ref 4.0–10.5)

## 2011-05-12 NOTE — Progress Notes (Signed)
  Subjective:    Patient ID: Patrick [name], male    DOB: 03/10/1961, 50 y.o.   MRN: [id number]  HPI Patrick [name] is seeing a work in basis today. Overall he is feeling better, but he has noted some increase in his lymph nodes recently. He has not missed any of his medications. He has been gaining weight. His leg swelling has improved with compressive stockings.    Review of Systems     Objective:   Physical Exam  Constitutional: No distress.       His weight is up to 230 pounds.  HENT:  Mouth/Throat: Oropharynx is clear and moist. No oropharyngeal exudate.  Eyes: Conjunctivae are normal.  Cardiovascular: Normal rate, regular rhythm and normal heart sounds.   No murmur heard. Pulmonary/Chest: Breath sounds normal. He has no wheezes. He has no rales.  Abdominal: Soft. Bowel sounds are normal. He exhibits no distension. There is no tenderness.  Musculoskeletal: He exhibits no edema.  Lymphadenopathy:    He has cervical adenopathy.       He has large rubbery axillary and submandibular nodes. He has a firm right inguinal node.  Skin:       His chronic a dermatitis on his left medial ankle is unchanged.  Psychiatric: He has a normal mood and affect.          Assessment & Plan:

## 2011-05-12 NOTE — Assessment & Plan Note (Signed)
Patrick [name] has had fairly dramatic CD4 reconstitution over the past 6 months. 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 I am less concerned about the possibility of lymphoma. I will have him continue his current therapy and recheck CD4 and viral load today. I will see him back next month.

## 2011-05-13 LAB — T-HELPER CELL (CD4) - (RCID CLINIC ONLY): CD4 T Cell Abs: 190 uL — ABNORMAL LOW (ref 400–2700)

## 2011-05-15 ENCOUNTER — Telehealth: Payer: Self-pay | Admitting: *Deleted

## 2011-05-15 NOTE — Telephone Encounter (Signed)
Patient called requesting his lab results, said his lymph nodes are still swollen. Wendall Mola CMA

## 2011-05-18 NOTE — Telephone Encounter (Signed)
Please call Patrick [name] and tell him that his CD4 count is 190 and his viral load is excellent at less than 20 (undetectable). Please asked him to call me back if his lymph nodes are enlarging further or he is feeling worse before his appointment in one month.

## 2011-06-02 ENCOUNTER — Encounter: Payer: Medicaid Other | Admitting: Internal Medicine

## 2011-06-05 ENCOUNTER — Other Ambulatory Visit: Payer: Self-pay | Admitting: Internal Medicine

## 2011-06-08 ENCOUNTER — Other Ambulatory Visit: Payer: Self-pay | Admitting: *Deleted

## 2011-06-08 DIAGNOSIS — B20 Human immunodeficiency virus [HIV] disease: Secondary | ICD-10-CM

## 2011-06-08 MED ORDER — DAPSONE 100 MG PO TABS: 100.0000 mg | ORAL_TABLET | Freq: Every day | ORAL | Status: DC

## 2011-06-09 ENCOUNTER — Other Ambulatory Visit (INDEPENDENT_AMBULATORY_CARE_PROVIDER_SITE_OTHER): Payer: Medicaid Other

## 2011-06-09 ENCOUNTER — Ambulatory Visit: Payer: Medicaid Other | Admitting: Internal Medicine

## 2011-06-09 DIAGNOSIS — E291 Testicular hypofunction: Secondary | ICD-10-CM

## 2011-06-09 DIAGNOSIS — B20 Human immunodeficiency virus [HIV] disease: Secondary | ICD-10-CM

## 2011-06-09 LAB — CBC
Hemoglobin: 12.8 g/dL — ABNORMAL LOW (ref 13.0–17.0)
MCH: 29.6 pg (ref 26.0–34.0)
MCHC: 31.8 g/dL (ref 30.0–36.0)
MCV: 93.3 fL (ref 78.0–100.0)

## 2011-06-10 LAB — COMPLETE METABOLIC PANEL WITH GFR
ALT: 13 U/L (ref 0–53)
AST: 16 U/L (ref 0–37)
CO2: 21 mEq/L (ref 19–32)
Chloride: 103 mEq/L (ref 96–112)
GFR, Est African American: >60 mL/min (ref >60)
Sodium: 136 mEq/L (ref 135–145)
Total Bilirubin: 0.7 mg/dL (ref 0.3–1.2)
Total Protein: 7.9 g/dL (ref 6.0–8.3)

## 2011-06-10 LAB — T-HELPER CELL (CD4) - (RCID CLINIC ONLY): CD4 % Helper T Cell: 15 % — ABNORMAL LOW (ref 33–55)

## 2011-06-10 LAB — HIV-1 RNA QUANT-NO REFLEX-BLD: HIV-1 RNA Quant, Log: <1.3 {Log} (ref <1.30)

## 2011-06-11 LAB — TESTOSTERONE, FREE, TOTAL, SHBG
Sex Hormone Binding: 25 nmol/L (ref 13–71)
Testosterone, Free: 93.1 pg/mL (ref 47.0–244.0)
Testosterone: 394.45 ng/dL (ref 250–890)

## 2011-06-23 ENCOUNTER — Ambulatory Visit (INDEPENDENT_AMBULATORY_CARE_PROVIDER_SITE_OTHER): Payer: Medicaid Other | Admitting: Internal Medicine

## 2011-06-23 ENCOUNTER — Ambulatory Visit: Payer: Medicaid Other

## 2011-06-23 ENCOUNTER — Encounter: Payer: Self-pay | Admitting: Internal Medicine

## 2011-06-23 VITALS — BP 138/83 | HR 97 | Temp 97.8°F | Ht 73.0 in | Wt 238.0 lb

## 2011-06-23 DIAGNOSIS — E291 Testicular hypofunction: Secondary | ICD-10-CM

## 2011-06-23 DIAGNOSIS — B20 Human immunodeficiency virus [HIV] disease: Secondary | ICD-10-CM

## 2011-06-23 DIAGNOSIS — Z23 Encounter for immunization: Secondary | ICD-10-CM

## 2011-06-23 DIAGNOSIS — I10 Essential (primary) hypertension: Secondary | ICD-10-CM

## 2011-06-23 NOTE — Assessment & Plan Note (Signed)
His blood pressures under better control.

## 2011-06-23 NOTE — Assessment & Plan Note (Addendum)
Will recheck a testosterone level before his next visit in 3 months.  Addendum: His level was low so I will increase his Androgel to 10 grams daily.

## 2011-06-23 NOTE — Progress Notes (Signed)
Addended by: Jennet Maduro D on: 06/23/2011 09:44 AM   Modules accepted: Orders

## 2011-06-23 NOTE — Progress Notes (Signed)
  Subjective:    Patient ID: Patrick [name], male    DOB: [birth year], 50 y.o.   MRN: [id number]  HPI Patrick [name] is in for his routine visit. He is feeling better. He has not missed any of his medications since his last visit. His adenopathy has decreased.    Review of Systems     Objective:   Physical Exam  Constitutional: No distress.  HENT:  Mouth/Throat: Oropharynx is clear and moist. No oropharyngeal exudate.       He has no palpable adenopathy.  Eyes: Conjunctivae are normal.  Cardiovascular: Normal rate, regular rhythm and normal heart sounds.   No murmur heard. Pulmonary/Chest: Breath sounds normal.  Abdominal: Soft. Bowel sounds are normal.  Psychiatric: He has a normal mood and affect.          Assessment & Plan:

## 2011-06-23 NOTE — Assessment & Plan Note (Signed)
He is having steady CD4 reconstitution. I will continue his Atripla. He has had several bouts of IRIS but seems to be past this now.

## 2011-06-24 ENCOUNTER — Telehealth: Payer: Self-pay | Admitting: *Deleted

## 2011-06-24 LAB — TESTOSTERONE, FREE, TOTAL, SHBG
Testosterone, Free: 31.6 pg/mL — ABNORMAL LOW (ref 47.0–244.0)
Testosterone-% Free: 2.2 % (ref 1.6–2.9)

## 2011-06-24 MED ORDER — TESTOSTERONE 50 MG/5GM (1%) TD GEL: 10.0000 g | Freq: Every day | TRANSDERMAL | Status: DC

## 2011-06-24 NOTE — Progress Notes (Signed)
Addended by: Cliffton Asters on: 06/24/2011 09:26 AM   Modules accepted: Orders

## 2011-06-24 NOTE — Telephone Encounter (Signed)
Message left for pt regarding Dr. Blair Dolphin review of recent testosterone blood work and change in Androgel dose.  New rx e-prescribed to [facility] for pick-up later today.  Advised pt to call Center for questions regarding this rx change.  Jennet Maduro, RN

## 2011-06-29 ENCOUNTER — Other Ambulatory Visit: Payer: Self-pay | Admitting: *Deleted

## 2011-06-30 ENCOUNTER — Encounter: Payer: Self-pay | Admitting: Internal Medicine

## 2011-06-30 ENCOUNTER — Ambulatory Visit (INDEPENDENT_AMBULATORY_CARE_PROVIDER_SITE_OTHER): Payer: Medicaid Other | Admitting: Internal Medicine

## 2011-06-30 VITALS — BP 118/68 | HR 87 | Temp 98.8°F | Ht 72.0 in | Wt 240.6 lb

## 2011-06-30 DIAGNOSIS — R7309 Other abnormal glucose: Secondary | ICD-10-CM

## 2011-06-30 DIAGNOSIS — E785 Hyperlipidemia, unspecified: Secondary | ICD-10-CM

## 2011-06-30 DIAGNOSIS — L259 Unspecified contact dermatitis, unspecified cause: Secondary | ICD-10-CM

## 2011-06-30 DIAGNOSIS — B20 Human immunodeficiency virus [HIV] disease: Secondary | ICD-10-CM

## 2011-06-30 DIAGNOSIS — K439 Ventral hernia without obstruction or gangrene: Secondary | ICD-10-CM

## 2011-06-30 DIAGNOSIS — N19 Unspecified kidney failure: Secondary | ICD-10-CM

## 2011-06-30 DIAGNOSIS — R739 Hyperglycemia, unspecified: Secondary | ICD-10-CM

## 2011-06-30 DIAGNOSIS — E669 Obesity, unspecified: Secondary | ICD-10-CM

## 2011-06-30 DIAGNOSIS — I10 Essential (primary) hypertension: Secondary | ICD-10-CM

## 2011-06-30 DIAGNOSIS — L309 Dermatitis, unspecified: Secondary | ICD-10-CM

## 2011-06-30 NOTE — Patient Instructions (Signed)
I will see you back in one month. Happy exercising and healthy habits. You can do it! We will let you know about your referrals to dermatology and surgery.

## 2011-06-30 NOTE — Progress Notes (Signed)
  Subjective:    Patient ID: Patrick [name], male    DOB: 08/11/1961, 50 y.o.   MRN: [id number]  HPI 50 year old new patient referred by Dr. Cliffton Asters to establish primary care. I took care of patient when he was admitted 3/29-3/30/2012 as the attending on the service. The patient was admitted for fever and hypotension associated with the IRIS. He had not been consistent with taking his HAART meds. At this point his counts are going up (9/12), CD4 270, VL <20.  Patrick [name] comes in today saying that he really has not been taking care of himself but he is ready to start doing so and we talked about that at length today. Patient said before he was diagnosed with HIV 3 years ago he had started taking care of himself and was actually biking 120 miles a week. He is motivated he says to start exercising again I strongly encouraged this. He also has not been eating a healthy diet. His weight is 240 pounds and he would like to 200.  Patient does have a support system of friends but is living by himself. He works 2 part-time jobs.  Patient has had a rash near his left medial malleolus for some time. He has been treated with both Nizoral and triamcinolone cream and yet it persists. He thinks as his counts increase that it actually may be getting worse. He wonders if he can be referred to dermatologist. It is pruritic.  He was diagnosed with HIV 12/06/2008. At that time his CD4 count was 10 and viral load 541, 000. Patient does have a history of hypertension and has for several years. He is on 2 agents and it is usually fairly well controlled. He also has a history of HIVAN.   Review of Systems  Constitutional: Negative for chills and fatigue.  Respiratory: Negative for shortness of breath.   Cardiovascular: Negative for chest pain.  Gastrointestinal: Negative for nausea, vomiting and abdominal pain.       Objective:   Physical Exam  Constitutional: He appears well-developed and well-nourished.    HENT:  Head: Normocephalic and atraumatic.  Neck: No JVD present.  Cardiovascular: Normal rate and regular rhythm.   Pulmonary/Chest: Breath sounds normal.  Abdominal: Bowel sounds are normal.  Musculoskeletal: He exhibits no edema.  Lymphadenopathy:    He has cervical adenopathy.   abdominal exam reveals ventral hernia just to the right of his scar status post appendectomy ID history of bowel contents and hear it with the stethoscope. This is reducible but concerning. Skin-there is a 7 x 8 area of confluent raised rash around the left medial malleolus.        Assessment & Plan:

## 2011-07-01 LAB — LIPID PANEL
Cholesterol: 92 mg/dL (ref 0–200)
Total CHOL/HDL Ratio: 11.5 Ratio
Triglycerides: 208 mg/dL — ABNORMAL HIGH (ref <150)
VLDL: 42 mg/dL — ABNORMAL HIGH (ref 0–40)

## 2011-07-01 LAB — COMPREHENSIVE METABOLIC PANEL
CO2: 27 mEq/L (ref 19–32)
Calcium: 8.6 mg/dL (ref 8.4–10.5)
Creat: 1.49 mg/dL — ABNORMAL HIGH (ref 0.50–1.35)
Glucose, Bld: 215 mg/dL — ABNORMAL HIGH (ref 70–99)
Total Bilirubin: 0.4 mg/dL (ref 0.3–1.2)
Total Protein: 7.4 g/dL (ref 6.0–8.3)

## 2011-07-01 NOTE — Assessment & Plan Note (Signed)
Will check fasting glucose today. Patrick [name] knows the importance of modification of diet, exercise so that he does not develop diabetes.

## 2011-07-01 NOTE — Assessment & Plan Note (Signed)
Will check and see where Cr is today. Patient carries diagnosis of ?HIVAN. There has been history of nephrolithisis requiring stent in past. Will review old records.

## 2011-07-01 NOTE — Assessment & Plan Note (Signed)
Patient is followed by Dr. Orvan Falconer. He did want to discuss IRIS some today.

## 2011-07-01 NOTE — Assessment & Plan Note (Signed)
Please see patient care coordination note.

## 2011-07-01 NOTE — Assessment & Plan Note (Signed)
Etiology of rash not entirely clear. I have told him to stop the nizoral cream and that he can continue the triamcinalone cream. However I have referred him to dermatology and asked him not to use the cream for a week before seeing them.

## 2011-07-01 NOTE — Assessment & Plan Note (Signed)
Patient's hernia is reducible but bowel contents is definitely felt and heard. Will refer him to general surgery for repair.

## 2011-07-01 NOTE — Assessment & Plan Note (Signed)
Fasting lipid panel drawn today. Patient would rather not be started on medication at this time as he plan to start aggressive diet and exercise modifications.

## 2011-07-01 NOTE — Assessment & Plan Note (Signed)
Patient's blood pressure is well controlled today. Will continue current medications.

## 2011-07-08 LAB — CULTURE, ROUTINE-ABSCESS

## 2011-07-23 NOTE — Progress Notes (Signed)
Addended by: Maura Crandall on: 07/23/2011 03:15 PM   Modules accepted: Orders

## 2011-08-04 ENCOUNTER — Encounter: Payer: Self-pay | Admitting: Internal Medicine

## 2011-08-04 ENCOUNTER — Ambulatory Visit (INDEPENDENT_AMBULATORY_CARE_PROVIDER_SITE_OTHER): Payer: Medicaid Other | Admitting: Internal Medicine

## 2011-08-04 VITALS — BP 126/74 | HR 93 | Temp 97.6°F | Wt 230.2 lb

## 2011-08-04 DIAGNOSIS — B20 Human immunodeficiency virus [HIV] disease: Secondary | ICD-10-CM

## 2011-08-04 DIAGNOSIS — K439 Ventral hernia without obstruction or gangrene: Secondary | ICD-10-CM

## 2011-08-04 DIAGNOSIS — E785 Hyperlipidemia, unspecified: Secondary | ICD-10-CM

## 2011-08-04 DIAGNOSIS — R7309 Other abnormal glucose: Secondary | ICD-10-CM

## 2011-08-04 DIAGNOSIS — E669 Obesity, unspecified: Secondary | ICD-10-CM

## 2011-08-04 DIAGNOSIS — L309 Dermatitis, unspecified: Secondary | ICD-10-CM

## 2011-08-04 DIAGNOSIS — L259 Unspecified contact dermatitis, unspecified cause: Secondary | ICD-10-CM

## 2011-08-04 DIAGNOSIS — N189 Chronic kidney disease, unspecified: Secondary | ICD-10-CM

## 2011-08-04 DIAGNOSIS — I10 Essential (primary) hypertension: Secondary | ICD-10-CM

## 2011-08-04 NOTE — Patient Instructions (Signed)
I will see you back in 1 month. Please come fasting. Nothing to eat after midnight. Keep up the good work.

## 2011-08-05 ENCOUNTER — Encounter: Payer: Self-pay | Admitting: Internal Medicine

## 2011-08-05 DIAGNOSIS — N179 Acute kidney failure, unspecified: Secondary | ICD-10-CM | POA: Insufficient documentation

## 2011-08-05 NOTE — Assessment & Plan Note (Signed)
Patient's rash unchanged today. He will probably get in to see a dermatologist soon. If we are unable to do that will consider high potency topical steroid for what looks most like excema and possibly a biopsy.

## 2011-08-05 NOTE — Assessment & Plan Note (Signed)
Excellent control. Continue current medications 

## 2011-08-05 NOTE — Assessment & Plan Note (Signed)
At this point I am most concerned about his very low HDL. It has gone up in the past when he has been taking better care of himself. I will recheck a fasting panel when he returns in 1 month.

## 2011-08-05 NOTE — Assessment & Plan Note (Signed)
Patient has currently lost 10 pounds in the last month. He is also exercising 2 times a week. I have told him I would like him to work up to 4 times a week by the time he returns in one month.

## 2011-08-05 NOTE — Assessment & Plan Note (Signed)
Patient is to see surgery in the next 1-2 weeks.

## 2011-08-05 NOTE — Progress Notes (Signed)
  Subjective:    Patient ID: Patrick [name], male    DOB: 1961-07-16, 50 y.o.   MRN: [id number]  HPI 50 year old who comes in for 1 month follow up. He is doing better. He has lost 10 pounds and he is exercising 2 times a week. He has gotten back on his bike and he is also walking.  We did review his labs from the last visit which I had purposely waited to tell him about. He has had high glucoses for some time: up as high as 190, but last month's was 215. I am certain that it has come down.  His lipids revealed: HDL <9, LDL 42, TG 208 (which have been higher), and total chol. 92.   And his Cr was 1.49. 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  His attitude is great and he wants to work up to exercising everyday.  He is scheduled to see surgery for his hernia in the next couple of weeks. We are working on his dermatology referral while he is here today. Interestingly he remembers the rash going away when he had been on IV antibiotics.    Review of Systems  Constitutional: Negative for fatigue.  Respiratory: Negative for shortness of breath.   Cardiovascular: Negative for chest pain.  Gastrointestinal: Negative for nausea, vomiting and abdominal pain.       Objective:   Physical Exam  Constitutional: He appears well-developed. No distress.  HENT:  Head: Normocephalic and atraumatic.  Cardiovascular: Normal rate, regular rhythm and normal heart sounds.   No murmur heard. Pulmonary/Chest: Breath sounds normal.  Abdominal: Soft. Bowel sounds are normal. There is no tenderness.  Musculoskeletal: Normal range of motion. He exhibits no edema.  Skin: Skin is warm and dry.  Psychiatric: He has a normal mood and affect.          Assessment & Plan:

## 2011-08-05 NOTE — Assessment & Plan Note (Signed)
Patient is doing better than even a month ago. His lymphadenopathy has decreased significantly.

## 2011-08-06 ENCOUNTER — Ambulatory Visit (INDEPENDENT_AMBULATORY_CARE_PROVIDER_SITE_OTHER): Payer: Self-pay | Admitting: General Surgery

## 2011-08-06 NOTE — Assessment & Plan Note (Signed)
Patient has lost 10 pounds this month. He seems motivated to continue and increase his exercise.

## 2011-08-18 ENCOUNTER — Telehealth: Payer: Self-pay | Admitting: *Deleted

## 2011-08-18 MED ORDER — AZITHROMYCIN 250 MG PO TABS: ORAL_TABLET | ORAL | Status: AC

## 2011-08-18 NOTE — Telephone Encounter (Signed)
Call from patient requesting antibiotic for sinus infection.  Complaining of HA, post nasal drip, mucus, congestion.  Discussed with Dr Coralee Pesa, pt to be rx'd a Zpack and instructed to contact clinic should symptoms persist.Patrick Snell Cassady11/20/201211:32 AM

## 2011-08-25 ENCOUNTER — Encounter: Payer: Medicaid Other | Admitting: Internal Medicine

## 2011-08-26 ENCOUNTER — Ambulatory Visit (INDEPENDENT_AMBULATORY_CARE_PROVIDER_SITE_OTHER): Payer: Medicaid Other | Admitting: General Surgery

## 2011-08-26 ENCOUNTER — Encounter (INDEPENDENT_AMBULATORY_CARE_PROVIDER_SITE_OTHER): Payer: Self-pay | Admitting: General Surgery

## 2011-08-26 VITALS — BP 154/86 | HR 76 | Temp 97.6°F | Resp 18 | Ht 72.0 in | Wt 223.8 lb

## 2011-08-26 DIAGNOSIS — K432 Incisional hernia without obstruction or gangrene: Secondary | ICD-10-CM

## 2011-08-26 NOTE — Progress Notes (Signed)
Patient ID: Patrick [name], male   DOB: 09-30-1960, 50 y.o.   MRN: [id number]  Chief Complaint  Patient presents with  . Other    new pt- eval ventral hernia    HPI Patrick [name] is a 50 y.o. male.   HPI 50 year old Caucasian male diagnosed with HIV disease in 2010 is referred by Dr. Coralee Pesa for evaluation of his ventral hernia. The patient underwent an open midline exploratory surgery in 2001 for ruptured appendicitis. He states that he has noticed a small bulge in his upper abdomen on the right for several years now. He denies any nausea, vomiting, abdominal pain, diarrhea or constipation. He has lost about 10 pounds which has been intentional. He has been exercising more frequently. He states that his viral load is undetectable and his CD4 count is improving.   Past Medical History  Diagnosis Date  . Hypertension   . Renal insufficiency     hydrated cr 1.5 gfr 48  . HIV (human immunodeficiency virus infection)   . Hypogonadism male     Past Surgical History  Procedure Date  . Lithotripsy 2010  . Appendectomy 2001    via midline incision  . Lymph node biopsy 2010, 2011    bilateral... by dr Derrell Lolling    Family History  Problem Relation Age of Onset  . Adopted: Yes    Social History History  Substance Use Topics  . Smoking status: Never Smoker   . Smokeless tobacco: Never Used  . Alcohol Use: Yes     monthly    Allergies  Allergen Reactions  . Codeine   . Sulfonamide Derivatives     Current Outpatient Prescriptions  Medication Sig Dispense Refill  . amLODipine (NORVASC) 10 MG tablet Take 1 tablet (10 mg total) by mouth daily.  30 tablet  11  . dapsone 100 MG tablet Take 1 tablet (100 mg total) by mouth daily.  30 tablet  6  . efavirenz-emtrictabine-tenofovir (ATRIPLA) 600-200-300 MG per tablet Take 1 tablet by mouth at bedtime.  30 tablet  11  . ketoconazole (NIZORAL) 2 % cream Apply topically 2 (two) times daily.  30 g  1  . labetalol (NORMODYNE) 200 MG  tablet TAKE 1 TABLET BY MOUTH TWICE DAILY  60 tablet  2  . testosterone (ANDROGEL) 50 MG/5GM GEL Place 10 g onto the skin daily.  30 Package  5  . triamcinolone (KENALOG) 0.025 % ointment Apply topically 2 (two) times daily.  30 g  0    Review of Systems Review of Systems  Constitutional: Negative for fever, chills, activity change, appetite change and fatigue.  HENT: Negative for nosebleeds, congestion and neck pain.   Eyes: Negative for photophobia and visual disturbance.  Respiratory: Negative for cough, chest tightness, shortness of breath and wheezing.   Cardiovascular: Negative for chest pain, palpitations and leg swelling.  Gastrointestinal: Negative for abdominal pain, diarrhea, constipation and abdominal distention.  Genitourinary: Negative for urgency, hematuria, flank pain and difficulty urinating.  Musculoskeletal: Negative for back pain and joint swelling.  Skin: Positive for rash (L medial ankle rash- saw dermatologist, probable eczema, start steroid cream).  Neurological: Negative for dizziness, tremors, seizures, weakness and light-headedness.  Hematological: Positive for adenopathy (been present for awhile, improving, underwent a bx which was benign). Does not bruise/bleed easily.  Psychiatric/Behavioral: Negative.     Blood pressure 154/86, pulse 76, temperature 97.6 F (36.4 C), temperature source Temporal, resp. rate 18, height 6' (1.829 m), weight 223 lb 12.8 oz (101.515  kg).  Physical Exam Physical Exam  Vitals reviewed. Constitutional: He is oriented to person, place, and time. He appears well-developed and well-nourished. No distress.  HENT:  Head: Normocephalic and atraumatic.  Mouth/Throat: No oropharyngeal exudate.  Eyes: Conjunctivae are normal. No scleral icterus.  Neck: Neck supple. No tracheal deviation present. No thyromegaly present.  Cardiovascular: Normal rate, regular rhythm and normal heart sounds.   Pulmonary/Chest: Effort normal and breath  sounds normal. No respiratory distress. He has no wheezes.  Abdominal: Soft. Bowel sounds are normal. He exhibits no distension. There is no tenderness.         Upper incisional hernia to the right of midline, reducible, about 2-3cm  Musculoskeletal: Normal range of motion. He exhibits no edema and no tenderness.  Lymphadenopathy:    He has cervical adenopathy (mild spotty).       Right: No inguinal adenopathy present.       Left: No inguinal adenopathy present.  Neurological: He is alert and oriented to person, place, and time. He exhibits normal muscle tone.  Skin: Skin is warm and dry. Rash noted. Rash is macular (medial rt ankle). He is not diaphoretic.  Psychiatric: He has a normal mood and affect. His behavior is normal. Thought content normal.    Data Reviewed Dr Blair Dolphin last 2 office notes Dr Sandy Salaam office notes CT abd/pelvis from 10/2010 - about a 2.5 by 2 cm ventral incisional hernia to right of midline on ct; lymphadenopathy, mild right hydro  Assessment    Ventral incisional hernia HIV HTN Chronic Kidney Disease Obesity    Plan    We discussed the etiology of ventrall hernias. We discussed the signs & symptoms of incarceration & strangulation.  We discussed non-operative and operative management. We discussed both open and laparoscopic approaches, the risks/benefits of each approach, and the typical aftercare.   The patient has elected to procedure with a Laparoscopic Ventral Incisional Hernia Repair with mesh   I described the procedure in detail.  The patient was given Agricultural engineer. We discussed the risks and benefits including but not limited to bleeding, infection, chronic abdominal pain, nerve entrapment, hernia recurrence, mesh complications, hematoma formation, urinary retention, injury to the intestines, blood clots, injury to the surrounding structures, and anesthesia risk. We also discussed the typical post operative recovery course, including no  heavy lifting for 6 weeks. I explained that the likelihood of improvement of their symptoms is good.  The patient would like to wait until January of next year.  I think this is perfectly acceptable. He can use this time to continue to lose weight.  In the interim, I will contact Dr Orvan Falconer to make sure he is in as best shape as possible for hernia repair with respect to his HIV.  Mary Sella. Andrey Campanile, MD, FACS General, Bariatric, & Minimally Invasive Surgery [facility], [place]         [facility] M 08/26/2011, 1:19 PM

## 2011-08-26 NOTE — Patient Instructions (Signed)
Continue to try to loose weight.  Our office will contact you to schedule surgery once I have clearance from Dr Orvan Falconer.  If you do not hear from my office by next week, please call us [phone]

## 2011-09-03 ENCOUNTER — Ambulatory Visit (INDEPENDENT_AMBULATORY_CARE_PROVIDER_SITE_OTHER): Payer: Medicaid Other | Admitting: Internal Medicine

## 2011-09-03 ENCOUNTER — Encounter: Payer: Self-pay | Admitting: Internal Medicine

## 2011-09-03 DIAGNOSIS — N189 Chronic kidney disease, unspecified: Secondary | ICD-10-CM

## 2011-09-03 DIAGNOSIS — E785 Hyperlipidemia, unspecified: Secondary | ICD-10-CM

## 2011-09-03 DIAGNOSIS — I129 Hypertensive chronic kidney disease with stage 1 through stage 4 chronic kidney disease, or unspecified chronic kidney disease: Secondary | ICD-10-CM

## 2011-09-03 DIAGNOSIS — I10 Essential (primary) hypertension: Secondary | ICD-10-CM

## 2011-09-03 DIAGNOSIS — R7309 Other abnormal glucose: Secondary | ICD-10-CM

## 2011-09-03 DIAGNOSIS — E669 Obesity, unspecified: Secondary | ICD-10-CM

## 2011-09-03 DIAGNOSIS — N2581 Secondary hyperparathyroidism of renal origin: Secondary | ICD-10-CM

## 2011-09-03 LAB — COMPREHENSIVE METABOLIC PANEL WITH GFR
ALT: 13 U/L (ref 0–53)
AST: 16 U/L (ref 0–37)
Albumin: 4.3 g/dL (ref 3.5–5.2)
Alkaline Phosphatase: 68 U/L (ref 39–117)
BUN: 23 mg/dL (ref 6–23)
CO2: 23 meq/L (ref 19–32)
Calcium: 9.6 mg/dL (ref 8.4–10.5)
Chloride: 102 meq/L (ref 96–112)
Creat: 1.34 mg/dL (ref 0.50–1.35)
Glucose, Bld: 110 mg/dL — ABNORMAL HIGH (ref 70–99)
Potassium: 4.6 meq/L (ref 3.5–5.3)
Sodium: 133 meq/L — ABNORMAL LOW (ref 135–145)
Total Bilirubin: 0.4 mg/dL (ref 0.3–1.2)
Total Protein: 8.5 g/dL — ABNORMAL HIGH (ref 6.0–8.3)

## 2011-09-03 LAB — LIPID PANEL
Cholesterol: 199 mg/dL (ref 0–200)
Triglycerides: 317 mg/dL — ABNORMAL HIGH (ref <150)

## 2011-09-03 LAB — POCT GLYCOSYLATED HEMOGLOBIN (HGB A1C): Hemoglobin A1C: 5.7

## 2011-09-03 NOTE — Patient Instructions (Signed)
Please return to see Dr. Janalyn Harder in 3 months. Keep up the great work!

## 2011-09-04 LAB — PTH, INTACT AND CALCIUM: Calcium, Total (PTH): 9.6 mg/dL (ref 8.4–10.5)

## 2011-09-04 NOTE — Progress Notes (Signed)
  Subjective:    Patient ID: Patrick [name], male    DOB: 1960/11/16, 50 y.o.   MRN: [id number]  HPI Patrick [name] comes in for 1 month follow up. He has lost 17 pounds in 2 months. He has been walking every day and eating more healthily.   He saw the dermatologist who diagnosed him with a type of excema and treated him with "10/10" strength clobetasol ointment and his rash on his ankle has all but gone away!  He has seen the general surgeon and a laparoscopic herniorrhaphy is planned for 1/13.    Review of Systems  Constitutional: Positive for activity change.  Respiratory: Negative for shortness of breath.   Cardiovascular: Negative for chest pain.  Gastrointestinal: Negative for abdominal pain.       Objective:   Physical Exam  Constitutional: He appears well-developed and well-nourished.  HENT:  Head: Normocephalic and atraumatic.  Neck: No JVD present.  Cardiovascular: Normal rate and regular rhythm.   Pulmonary/Chest: Breath sounds normal.  Abdominal: Bowel sounds are normal. There is no tenderness.  Musculoskeletal: He exhibits no edema.  Lymphadenopathy:    He has no cervical adenopathy.          Assessment & Plan:

## 2011-09-04 NOTE — Assessment & Plan Note (Signed)
Patrick [name] has lost 17 pounds in 2 months! He has changed his diet and is walking regularly. 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 He seems motivated to continue these healthy behaviors.

## 2011-09-04 NOTE — Assessment & Plan Note (Signed)
Will check Cr again today. It has been much nearer to normal.

## 2011-09-04 NOTE — Assessment & Plan Note (Signed)
Patient is fasting today and will again check fasting lipid panel. I suspect it will be improved with his weight loss and dietary changes.

## 2011-09-04 NOTE — Assessment & Plan Note (Signed)
Will check HbA1c and fasting glucose. Addn: HbA1c is 5.7! And fasting glucose is 110! These are markedly improved from fasting glucose of 215 just 2 months ago!

## 2011-09-04 NOTE — Assessment & Plan Note (Signed)
Blood pressure is well controlled on present medications. Continue current regimen.

## 2011-09-11 ENCOUNTER — Telehealth: Payer: Self-pay | Admitting: Internal Medicine

## 2011-09-11 NOTE — Telephone Encounter (Signed)
See Comments

## 2011-10-02 ENCOUNTER — Ambulatory Visit (HOSPITAL_COMMUNITY)
Admission: RE | Admit: 2011-10-02 | Discharge: 2011-10-02 | Disposition: A | Discharge status: Home / Self Care | Payer: Medicaid Other | Source: Ambulatory Visit | Attending: Internal Medicine | Admitting: Internal Medicine

## 2011-10-02 ENCOUNTER — Encounter: Payer: Self-pay | Admitting: Internal Medicine

## 2011-10-02 ENCOUNTER — Ambulatory Visit (INDEPENDENT_AMBULATORY_CARE_PROVIDER_SITE_OTHER): Payer: Medicaid Other | Admitting: Internal Medicine

## 2011-10-02 DIAGNOSIS — R059 Cough, unspecified: Secondary | ICD-10-CM | POA: Insufficient documentation

## 2011-10-02 DIAGNOSIS — R05 Cough: Secondary | ICD-10-CM | POA: Insufficient documentation

## 2011-10-02 DIAGNOSIS — R509 Fever, unspecified: Secondary | ICD-10-CM

## 2011-10-02 DIAGNOSIS — R599 Enlarged lymph nodes, unspecified: Secondary | ICD-10-CM

## 2011-10-02 DIAGNOSIS — B20 Human immunodeficiency virus [HIV] disease: Secondary | ICD-10-CM

## 2011-10-02 DIAGNOSIS — R591 Generalized enlarged lymph nodes: Secondary | ICD-10-CM

## 2011-10-02 LAB — CBC WITH DIFFERENTIAL/PLATELET
Basophils Relative: 0 % (ref 0–1)
Eosinophils Absolute: 0 10*3/uL (ref 0.0–0.7)
Eosinophils Relative: 1 % (ref 0–5)
Hemoglobin: 9.5 g/dL — ABNORMAL LOW (ref 13.0–17.0)
MCH: 28.8 pg (ref 26.0–34.0)
MCHC: 32.5 g/dL (ref 30.0–36.0)
MCV: 88.5 fL (ref 78.0–100.0)
Monocytes Relative: 23 % — ABNORMAL HIGH (ref 3–12)
Neutrophils Relative %: 53 % (ref 43–77)

## 2011-10-02 MED ORDER — AZITHROMYCIN 250 MG PO TABS: ORAL_TABLET | ORAL | Status: AC

## 2011-10-02 NOTE — Progress Notes (Signed)
Subjective:    Patient ID: Patrick [name], male    DOB: May 13, 1961, 51 y.o.   MRN: [id number]  HPI  Pt is a 51 year old male with HIV (last CD4 of 270 with undetectable viral load in September 2012) presents today with complaints of one week of flu like symptoms and swollen lymph nodes.  He first noted increased lymphadenopathy 5 days prior to his office visit and falling lymph nodes in his neck, armpits, and groins.  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  He states his current symptoms are similar to an "IRIS flare".   He denies any shortness of breath or chest pain but admits to significant fatigue over the past week. He denies sore throat. He admits to a few days of sinus congestion with drainage and "ear fullness" with dry cough.  He denies ear pain, loss of hearing, tinnitus, or discharge.  He reports episodes of diaphoresis occurring mainly but also occasionally in the day.  He reports home temperature readings that average 100.5 the home MAXIMUM TEMPERATURE of 100.6.     He reports numerous sick contacts.   He is UTD on annual flu shot.  Admits to some mild nausea but no abdominal pain, vomiting, or diarrhea.  He travelled to [place], [place] for the holidays but denies any travel outside of [place] or overseas in the past year.  He has not recent gone on any camping trips and denies exposure to fresh water sources; only water source is [place].  He denies any no animal exposures and has not consumed  Raw/undercooked foods or unpasturized products.     He is currently taking Atripla and dapsone for treatment of his HIV.  He missed two days of atripla and dapsone over xmas but has otherwise been compliant.    Review of Systems Review of Systems : Pertinent items also noted in history of present illness Constitutional: Positive for fever, chills, diaphoresis, activity change, and fatigue.  Negative for unexpected weight change and  appetite change.  HENT: Negative for hearing loss,ear pain,  and neck stiffness.   Eyes: Negative for photophobia, pain and visual disturbance.  Respiratory: Negative for chest pain, chest tightness, hemoptysis, productive cough, shortness of breath and wheezing.   Cardiovascular: Negative for chest pain and palpitations.  Gastrointestinal: Negative for abdominal pain, blood in stool and anal bleeding.  Genitourinary: Negative for dysuria, hematuria, penile discharge, and difficulty urinating.  Musculoskeletal: Negative for joint swelling.  Neurological: Negative for dizziness, syncope, speech difficulty, weakness, numbness and headaches.      Objective:   Physical Exam VItal signs reviewed and stable. GEN: No acute distress the patient is diaphoretic and appears mildly ill.  Alert and oriented x 3.  Pleasant, conversant, and cooperative to exam. HEENT: head is autraumatic and normocephalic.  Neck is supple.  Diffuse lymphadenopathy noted throughout posterior and anterior cervical chains; submandibular LAD also noted.  No JVD or carotid bruits.  Vision intact.  EOMI.  PERRLA.  Sclerae anicteric.  Conjunctivae without pallor or injection. Mucous membranes are moist.  Oropharynx is without erythema, exudates, or other abnormal lesions.   RESP:  Lungs are clear to ascultation bilaterally with good air movement.  No wheezes, ronchi, or rubs. CARDIOVASCULAR: regular rate, normal rhythm.  Clear S1, S2, no murmurs, gallops, or rubs. ABDOMEN: soft, non-tender, non-distended.  Bowels sounds present in all quadrants and normoactive.  No palpable masses.  Large midline surgical scar noted; this is  well healed LYMPH: Lymphadenopathy noted throughout anterior and posterior cervical chain bilaterally. No pre-or postauricular lymphadenopathy.  Bilateral axillary lymphadenopathy present.  Inguinal lymphadenopathy also present bilaterally.  All nodes were well circumscribed and freely mobile. EXT: warm and dry.   Peripheral pulses equal, intact, and +2 globally.  No clubbing or cyanosis.  No edema in bilateral lower extremities. SKIN: warm and dry with normal turgor.  No rashes or abnormal lesions observed. NEURO: CN II-XII grossly intact.  Muscle strength +5/5 in bilateral upper and lower extremities.  Sensation is grossly intact.  No focal deficit.     Assessment & Plan:

## 2011-10-02 NOTE — Patient Instructions (Signed)
Keep your followup appointment with Dr. Orvan Falconer next week. I will call you if any of the results of your blood or chest x-ray work are abnormal. Keep taking all of her medications as directed. Hope you feel better soon!

## 2011-10-02 NOTE — Assessment & Plan Note (Addendum)
I am unsure of the etiology behind patient's symptom constellation of fever and recurrent, diffuse lymphadenopathy.  HIV often causes diffuse lymphadenopathy and fever but would not expect this as he appears to be fairly well controlled with and undetectable viral load and CD4 of 270 as of Sept 2012.  Marland KitchenHe reports near 100% compliance with a ART and his last CD4  makes any OI an unlikely possibility for his current symptoms (particularly mycobacterium).    He reports some symptoms consistent with a mild URI/acute sinusitis but I doubt either process would result in diffuse LAD though I suppose it may be contributing to some of his sx.  Lymphoma is a concerning possibility, however patient is s/p LN bx that was negative for lymphoma.  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 - Will check CD4 and HIV VL today - Will also check CBC and obtain a set of blood cultures (bacteremia seems highly unlikely) - Will prescribe a z-pak for treatment of possible acute bacterial sinusitis, however his upper respiratory symptoms are more likely the result of a viral infection - He is UTD on his annual flu vaccine and is out of the ideal window for empiric tamiflu - Encouraged pt to keep with f/u appt with Dr. Orvan Falconer at the [facility]

## 2011-10-06 ENCOUNTER — Other Ambulatory Visit: Payer: Self-pay | Admitting: Infectious Diseases

## 2011-10-06 ENCOUNTER — Other Ambulatory Visit (INDEPENDENT_AMBULATORY_CARE_PROVIDER_SITE_OTHER): Payer: Medicaid Other

## 2011-10-06 DIAGNOSIS — B20 Human immunodeficiency virus [HIV] disease: Secondary | ICD-10-CM

## 2011-10-06 DIAGNOSIS — Z79899 Other long term (current) drug therapy: Secondary | ICD-10-CM

## 2011-10-06 DIAGNOSIS — Z113 Encounter for screening for infections with a predominantly sexual mode of transmission: Secondary | ICD-10-CM

## 2011-10-06 LAB — COMPLETE METABOLIC PANEL WITH GFR
AST: 7 U/L (ref 0–37)
Albumin: 3.1 g/dL — ABNORMAL LOW (ref 3.5–5.2)
BUN: 16 mg/dL (ref 6–23)
Calcium: 8.8 mg/dL (ref 8.4–10.5)
Chloride: 102 mEq/L (ref 96–112)
Glucose, Bld: 212 mg/dL — ABNORMAL HIGH (ref 70–99)
Potassium: 4.9 mEq/L (ref 3.5–5.3)
Sodium: 134 mEq/L — ABNORMAL LOW (ref 135–145)
Total Protein: 7.3 g/dL (ref 6.0–8.3)

## 2011-10-06 LAB — LIPID PANEL
Cholesterol: 91 mg/dL (ref 0–200)
VLDL: 46 mg/dL — ABNORMAL HIGH (ref 0–40)

## 2011-10-06 LAB — CBC
HCT: 28.1 % — ABNORMAL LOW (ref 39.0–52.0)
Hemoglobin: 9.1 g/dL — ABNORMAL LOW (ref 13.0–17.0)
WBC: 4.4 10*3/uL (ref 4.0–10.5)

## 2011-10-06 LAB — HIV-1 RNA QUANT-NO REFLEX-BLD: HIV 1 RNA Quant: 40 copies/mL — ABNORMAL HIGH (ref <20)

## 2011-10-07 LAB — T-HELPER CELL (CD4) - (RCID CLINIC ONLY)
CD4 % Helper T Cell: 17 % — ABNORMAL LOW (ref 33–55)
CD4 T Cell Abs: 220 uL — ABNORMAL LOW (ref 400–2700)

## 2011-10-07 LAB — SYPHILIS: RPR W/REFLEX TO RPR TITER AND TREPONEMAL ANTIBODIES, TRADITIONAL SCREENING AND DIAGNOSIS ALGORITHM

## 2011-10-08 LAB — HIV-1 RNA QUANT-NO REFLEX-BLD: HIV-1 RNA Quant, Log: <1.3 {Log} (ref <1.30)

## 2011-10-08 LAB — CULTURE, BLOOD (SINGLE): Organism ID, Bacteria: NO GROWTH

## 2011-10-20 ENCOUNTER — Encounter: Payer: Self-pay | Admitting: Internal Medicine

## 2011-10-20 ENCOUNTER — Ambulatory Visit (INDEPENDENT_AMBULATORY_CARE_PROVIDER_SITE_OTHER): Payer: Medicaid Other | Admitting: Internal Medicine

## 2011-10-20 VITALS — BP 148/78 | HR 68 | Temp 98.0°F | Ht 72.0 in | Wt 223.8 lb

## 2011-10-20 DIAGNOSIS — B20 Human immunodeficiency virus [HIV] disease: Secondary | ICD-10-CM

## 2011-10-20 NOTE — Progress Notes (Signed)
Patient ID: Patrick [name], male   DOB: 08-Aug-1961, 51 y.o.   MRN: [id number]  INFECTIOUS DISEASE PROGRESS NOTE    Subjective: Patrick [name] had some problem with recurrent lymphadenopathy earlier this month and was seen by his primary care physician. He was also having some low-grade fever and there was concern that he might have another episode of immune reconstitution inflammatory syndrome. The fever and lymphadenopathy resolved spontaneously however. He has done well other than that except for developing a "cold" over the past few days. He has had sinus congestion with a little bit of scratchy throat and nonproductive cough. He has not had any fever or shortness of breath. He recalls missing one dose of his Atripla since his last visit when he ran out early.  Objective:   General: He sounds congested Skin: No rash Oral: His mouth and throat are clear Lungs: Clear Cor: Regular S1-S2 no murmurs   Lab Results Lab Results  Component Value Date   WBC 4.4 10/06/2011   HGB 9.1* 10/06/2011   HCT 28.1* 10/06/2011   MCV 88.1 10/06/2011   PLT 187 10/06/2011    Lab Results  Component Value Date   CREATININE 1.44* 10/06/2011   BUN 16 10/06/2011   NA 134* 10/06/2011   K 4.9 10/06/2011   CL 102 10/06/2011   CO2 26 10/06/2011    Lab Results  Component Value Date   ALT <8 10/06/2011   AST 7 10/06/2011   ALKPHOS 49 10/06/2011   BILITOT 0.4 10/06/2011      HIV 1 RNA Quant (copies/mL)  Date Value  10/06/2011 <20   10/02/2011 40*  06/09/2011 <20      CD4 T Cell Abs (cmm)  Date Value  10/06/2011 220*  10/02/2011 170*  06/09/2011 270*     Microbiology: No results found for this or any previous visit (from the past 240 hour(s)).  Studies/Results: No results found.   Assessment: His HIV infection remains under good control with an undetectable viral load and a slowly reconstituted CD4 count. I will continue his Atripla and have encouraged him to try not to miss a single dose. He may have had a mild, transient episode  of IRIS recently, but this has resolved spontaneously.  He has a simple upper respiratory infection. I suggested he use Afrin nasal spray for the next 4-5 days.   His blood pressures under better control.   Plan: 1. Continue Atripla 2. Continue current antihypertensive agents 3. Afrin nasal spray for the next several days 4. Followup after blood work in 3 months   Cliffton Asters, MD [facility] [phone] pager   [phone] cell 10/20/2011, 9:27 AM

## 2011-11-03 ENCOUNTER — Other Ambulatory Visit: Payer: Self-pay | Admitting: Internal Medicine

## 2011-11-03 DIAGNOSIS — I10 Essential (primary) hypertension: Secondary | ICD-10-CM

## 2011-11-17 ENCOUNTER — Ambulatory Visit (INDEPENDENT_AMBULATORY_CARE_PROVIDER_SITE_OTHER): Payer: Medicaid Other | Admitting: Internal Medicine

## 2011-11-17 ENCOUNTER — Encounter: Payer: Self-pay | Admitting: Internal Medicine

## 2011-11-17 ENCOUNTER — Telehealth: Payer: Self-pay | Admitting: *Deleted

## 2011-11-17 ENCOUNTER — Ambulatory Visit (HOSPITAL_COMMUNITY)
Admission: RE | Admit: 2011-11-17 | Discharge: 2011-11-17 | Disposition: A | Discharge status: Home / Self Care | Payer: Medicaid Other | Source: Ambulatory Visit | Attending: Internal Medicine | Admitting: Internal Medicine

## 2011-11-17 VITALS — BP 117/68 | HR 98 | Temp 98.2°F | Wt 233.2 lb

## 2011-11-17 DIAGNOSIS — R059 Cough, unspecified: Secondary | ICD-10-CM | POA: Insufficient documentation

## 2011-11-17 DIAGNOSIS — R05 Cough: Secondary | ICD-10-CM

## 2011-11-17 DIAGNOSIS — E291 Testicular hypofunction: Secondary | ICD-10-CM

## 2011-11-17 DIAGNOSIS — R509 Fever, unspecified: Secondary | ICD-10-CM | POA: Insufficient documentation

## 2011-11-17 LAB — CBC
HCT: 30.1 % — ABNORMAL LOW (ref 39.0–52.0)
Hemoglobin: 10 g/dL — ABNORMAL LOW (ref 13.0–17.0)
MCH: 30.1 pg (ref 26.0–34.0)
MCHC: 33.2 g/dL (ref 30.0–36.0)
RBC: 3.32 MIL/uL — ABNORMAL LOW (ref 4.22–5.81)

## 2011-11-17 MED ORDER — AZITHROMYCIN 250 MG PO TABS: ORAL_TABLET | ORAL | Status: DC

## 2011-11-17 NOTE — Progress Notes (Signed)
Patient ID: CAIDENCE [name], male   DOB: 1961/07/19, 51 y.o.   MRN: [id number]  INFECTIOUS DISEASE PROGRESS NOTE    Subjective: Patrick [name] is seen on a work in basis for persistent cough and sinus congestion over the past month. Also, over the past weekend he began having fever again up to 102.5 associated with recurrent generalized adenopathy. His sinus congestion has persisted despite trying Afrin for about one week and recently Mucinex. He has not had any shortness of breath.  His also been bothered by erectile dysfunction over the last few months. He has not been sexually active and does not plan on it but notes that he still does not have erections even though he has been using the AndroGel.  He does not recall missing any doses of his medications.  Objective: Temp: 98.5 F (36.9 C) (02/19 1137) Temp src: Oral (02/19 1137) BP: 117/68 mmHg (02/19 0826) Pulse Rate: 98  (02/19 0826)  General: He looks a little worn out miserable Oral: His mouth and throat are clear Lymph nodes: He has generalized large rubbery lymph nodes in his neck, axillae and groin Skin: No rash Lungs: Clear Cor: Regular S1 and S2 no murmurs Abdomen: Soft and nontender. I cannot palpate his spleen.   Lab Results HIV 1 RNA Quant (copies/mL)  Date Value  10/06/2011 <20   10/02/2011 40*  06/09/2011 <20      CD4 T Cell Abs (cmm)  Date Value  10/06/2011 220*  10/02/2011 170*  06/09/2011 270*     Assessment: His HIV infection is under good control and he's had slow CD4 reconstitution. He has had multiple bouts of transient fever and generalized lymphadenopathy that I have felt were probably do to immune reconstitution inflammatory syndrome. However, it is quite unusual for this to continue to recur over such a long period of time, especially once his CD4 count is consistently above 200. His previous lymph node biopsy did not reveal any evidence of lymphoma. I will monitor him closely over the coming weeks and consider  rebiopsy if the adenopathy persists.  He has had persistent sinus congestion and cough over the past 4-6 weeks. I will give him a brief course of azithromycin to see if that helps. His chest x-ray this morning was clearer so I do not feel like he has pneumonia. It is possible that he does have a bacterial sinusitis.  He has erectile dysfunction and that is probably multifactorial. I will repeat a testosterone level today to see if his supplementation is helping. He also admits that his mood has been just "so-so"recently so mild depression may be a contributing factor as well. Although he is not sexually active with a partner he does admit to some performance anxiety when he is attempting masturbation.  Plan: 1. Continue current medications 2. Start Z-Pak 3. Check testosterone level 4. Followup in one week to reevaluate his generalized lymphadenopathy   Cliffton Asters, MD [facility] for Infectious Diseases [facility] [phone] pager   [phone] cell 11/17/2011, 11:55 AM

## 2011-11-17 NOTE — Progress Notes (Signed)
Addended by: Mariea Clonts D on: 11/17/2011 12:15 PM   Modules accepted: Orders

## 2011-11-17 NOTE — Progress Notes (Signed)
Subjective:   Patient ID: Patrick [name] male   DOB: 11/06/1960 50 y.o.   MRN: [id number]  HPI: Patrick [name] is a 51 y.o. with a PMHx of HIV (well controlled CD4 220 and viral load < 20 in 09/2011) who presents to the clinic today for the following:    1) Cough/ fevers: Pt describes a cough that is productive of clear and lumpy, low volume sputum, and has been ongoing x  5 weeks and has been getting slowly and minimally better since the time of onset. admits to associated fluctuation of temperatures ranging from 9F to aTmax of 102F last night - for which he took tylenol with appropriate reduction in temperature. Also is experiencing chills, ear fullness, nasal congestion, night sweats, and tender cervical lymphadenopathy (noted to have arisen over past several days). Denies generalized muscle aches and pain, recent weight loss (has gained 10 lbs from last visit in 10/20/2011), purulent nasal discharge, change in appetite, change in mental status, new rashes, abdominal pain, diarrhea, vomiting. Has had exposure to sick contact with similar symptoms - his roommate with similar symptoms. has not recent exposure to known allergens.  In regards to background information, the pt is not on an ACE-I or ARB. is a never smoker. does not have a history of asthma. does have a history of acid reflux - not currently on therapy. does experience seasonal allergies.  He denies recent travel to TB endemic areas, and has had negative quantiferon gold assay in 10/2010, and negative chest xray in 09/2011.   States he is very compliant with his HIV medications and last time he missed was in November 2012.    Review of Systems: Per HPI.  Current Outpatient Medications: Medication Sig  . amLODipine (NORVASC) 10 MG tablet Take 1 tablet (10 mg total) by mouth daily.  . dapsone 100 MG tablet Take 1 tablet (100 mg total) by mouth daily.  Marland Kitchen efavirenz-emtrictabine-tenofovir (ATRIPLA) 600-200-300 MG per tablet Take  1 tablet by mouth at bedtime.  Marland Kitchen labetalol (NORMODYNE) 200 MG tablet TAKE 1 TABLET BY MOUTH TWICE DAILY  . testosterone (ANDROGEL) 50 MG/5GM GEL Place 10 g onto the skin daily.    Allergies: Allergen Reactions  . Codeine   . Sulfonamide Derivatives     Past Medical History  Diagnosis Date  . Hypertension   . Renal insufficiency     hydrated cr 1.5 gfr 48  . HIV (human immunodeficiency virus infection)   . Hypogonadism male     Past Surgical History  Procedure Date  . Lithotripsy 2010  . Appendectomy 2001    via midline incision  . Lymph node biopsy 2010, 2011    bilateral... by dr Derrell Lolling     Objective:   Physical Exam: Filed Vitals:   11/17/11 0826  BP: 117/68  Pulse: 98  Temp: 98.2 F (36.8 C)     General: Vital signs reviewed and noted. Well-developed, well-nourished, in no acute distress; alert, appropriate and cooperative throughout examination.  Head: Normocephalic, atraumatic.  Eyes: conjunctivae/corneas clear. PERRL.  Ears: TM nonerythematous, not bulging, good light reflex bilaterally.  Nose: Mucous membranes moist, not inflammed, nonerythematous.  Throat: Oropharynx mildly erythematous, no exudate appreciated.   Neck: No stiffness of neck, bilateral anterior and posterior cervical lymphadenopathy, with some lymph nodes approximately 1 cm, mobile.   Lungs:  Normal respiratory effort. Clear to auscultation BL without crackles or wheezes.  Heart: RRR. S1 and S2 normal without gallop, rubs. No murmur.  Abdomen:  BS  normoactive. Soft, Nondistended, non-tender.  No masses or organomegaly.  Extremities: No pretibial edema.  Neurologic: A&O X3, CN II - XII are grossly intact.      Assessment & Plan:  Case and plan of care discussed with Dr. Ulyess Mort.

## 2011-11-17 NOTE — Assessment & Plan Note (Addendum)
Assessment: Given chronicity of cough (5 weeks), can be considered subacute at this time. It is unclear the etiology of his cough given duration, associated high fevers, suggesting possible infectious etiology, however without pulmonary exam findings to suggest pneumonia. The patient notably was evaluated for similar symptoms in 09/2011 at which time they were attributed to IRIS has had negative CXR, liver function tests within normal limits, RPR negative,  on 10/02/2011 when he last experienced similar symptoms. He was empirically treated with a 5-day course of Azithromycin during that visit (10/02/2011) and did transiently, he did have improvement of symptoms.  The differential diagnoses are vast, but include pulmonary tuberculosis (despite negative CXR in 09/2011, as findings may not be immediately evident on CXR), strep pharyngitis versus URI (in setting of lymphadenopathy, cough, oropharyngeal erythema - although less likely given duration, and diffuse lymphadenopathy). Lymphoma is also considered, but again less likely given negative biopsies in 2010 and 2011. Hepatitis was also considered, however, thought to be less likely given negative hepatitis panel in 2012 and recent liver function tests in 09/2011 being within normal limits.   I spoke with Dr. Aundria Rud, and he felt that given chronicity of his lymphadenopathy, recurrence of symptoms for which he was just evaluated in 09/2011 with concern of IRIS as likely source, that patient should be reevaluated by [facility] attendings with concern that more obscure causes such as EBV, CMV, others could potentially be the cause of the constellation of symptoms.   I called [facility], spoke with Golden Circle, RN, and [facility] is agreeable to see the patient for an acute visit today.  Plan:  Get repeat CXR - we are trying to get this done before his visit with Dr. Orvan Falconer  Blood cultures x 2  CBC STAT  Patient to follow-up with [facility] today at 11:00AM.

## 2011-11-17 NOTE — Progress Notes (Signed)
Addended by: Bufford Spikes on: 11/17/2011 12:07 PM   Modules accepted: Orders

## 2011-11-17 NOTE — Telephone Encounter (Signed)
Dr. Saralyn Pilar called from IM asking that we see this person. She has seen him & he has been treated with a 5 DAY COURSE OF aZITHROMYCIN. STILL HAVING FEVERS UP TO 102 & LYMPHADONOPATHY. He is no better. Transferred to front to make an appt. She is getting a chest Xray & blood cultures

## 2011-11-17 NOTE — Patient Instructions (Addendum)
   Please follow-up at the infectious diseases clinic at 11:00AM today, 11/17/2011.   Please follow-up at the clinic in 2 weeks, at which time we will reevaluate your blood pressure, hypogonadism, other chronic medical problems - OR, please follow-up in the clinic sooner if needed.  There have not been changes in your medications.  Please get the chest XRay done as soon as possible.  If you have been started on new medication(s), and you develop symptoms concerning for allergic reaction, including, but not limited to, throat closing, tongue swelling, rash, please stop the medication immediately and call the clinic at [phone], and go to the ER.  If symptoms worsen, or new symptoms arise, please call the clinic or go to the ER.  Please bring all of your medications in a bag to your next visit.

## 2011-11-18 LAB — TESTOSTERONE, FREE, TOTAL, SHBG
Sex Hormone Binding: 29 nmol/L (ref 13–71)
Testosterone: 103.9 ng/dL — ABNORMAL LOW (ref 250–890)

## 2011-11-19 ENCOUNTER — Telehealth: Payer: Self-pay | Admitting: *Deleted

## 2011-11-19 NOTE — Telephone Encounter (Signed)
Pt started Z-Pak Tuesday, 2/19.  Last night he woke up at 1 AM, temp was 102 and he took 3-ES Tylenol.  He woke up again at 4:30AM, temp was 102.5 and he took 3-ES Tylenol.  RN advised pt that he was taking too much Tylenol.  RN advised trying 1-Ibuprofen the next time he temp is elevated.  Then, recheck the temp in one hour to see if the Ibuprofen decreased the temp.  RN also recommended drinking 8 oz every hour during the daytime to maintain hydration.  Pt verbalized understanding.  Stated that he would obtain Ibuprofen.  Stated that he would start today.  He agreed to take the Ibuprofen before doing to bed since he wakes up with the fever.  Has an appt for Tuesday, Feb. 26 @ 9:30 AM and he plans on coming.  Pt knows to call if symptoms become excessive.

## 2011-11-19 NOTE — Telephone Encounter (Signed)
He called & left a message that his fever was 102 & then 3 hours later 102.5. States he called internal medicine & he was referred back here. Wanted to speak with Angelique Blonder. Message delivered to her

## 2011-11-20 ENCOUNTER — Telehealth: Payer: Self-pay | Admitting: *Deleted

## 2011-11-20 NOTE — Telephone Encounter (Signed)
Pt  Was seen in clinic on Tuesday and was sent to [facility] clinic. Pt put on Z-pac and will finish tomorrow. He is not feeling any better, fever between 100.5 - 102.6  ( treats with tylenol ), also lymph nodes are getting larger on neck and groin. He feels dizzy, no appetite, and headache.  Please advise Pt # [phone]

## 2011-11-20 NOTE — Telephone Encounter (Signed)
Patient called advised he is still sick. He still is running a fever (102.3) and feels horrible. He advised his glands in his neck are very large and sore to the touch he is having trouble moving his neck without pain. He wants to know if he should go to the ED. Advised him after speaking with RN to go to the ED and advise them he is a patient with Internal Medicine.

## 2011-11-20 NOTE — Telephone Encounter (Signed)
I talked with Dr Saralyn Pilar and she advised pt to return to ID for care.  They treated him last and put him on antibiotics.  He also sees ID for lymph node issues. Pt advised and voices understanding. If any problems over weekend he will go to ED

## 2011-11-20 NOTE — Telephone Encounter (Signed)
I spoke with Dr. Rogelia Boga regarding [name] and she agreed with the plan I proposed. I the patient should follow-up at his scheduled visit with Dr. Orvan Falconer, or certainly can be seen sooner if [facility] sees fit. I would also recommend for him to go to the ER if feeling worse and very poorly.  Johnette Abraham, D.O.

## 2011-11-21 ENCOUNTER — Inpatient Hospital Stay (HOSPITAL_COMMUNITY)
Admission: EM | Admit: 2011-11-21 | Discharge: 2011-11-28 | DRG: 977 | Disposition: A | Discharge status: Home / Self Care | Payer: Medicaid Other | Attending: Infectious Diseases | Admitting: Infectious Diseases

## 2011-11-21 ENCOUNTER — Emergency Department (HOSPITAL_COMMUNITY): Payer: Medicaid Other

## 2011-11-21 ENCOUNTER — Encounter (HOSPITAL_COMMUNITY): Payer: Self-pay | Admitting: *Deleted

## 2011-11-21 DIAGNOSIS — R05 Cough: Secondary | ICD-10-CM

## 2011-11-21 DIAGNOSIS — L309 Dermatitis, unspecified: Secondary | ICD-10-CM

## 2011-11-21 DIAGNOSIS — N2581 Secondary hyperparathyroidism of renal origin: Secondary | ICD-10-CM

## 2011-11-21 DIAGNOSIS — K432 Incisional hernia without obstruction or gangrene: Secondary | ICD-10-CM

## 2011-11-21 DIAGNOSIS — M109 Gout, unspecified: Secondary | ICD-10-CM

## 2011-11-21 DIAGNOSIS — B349 Viral infection, unspecified: Secondary | ICD-10-CM

## 2011-11-21 DIAGNOSIS — I10 Essential (primary) hypertension: Secondary | ICD-10-CM

## 2011-11-21 DIAGNOSIS — R7881 Bacteremia: Secondary | ICD-10-CM

## 2011-11-21 DIAGNOSIS — R509 Fever, unspecified: Secondary | ICD-10-CM

## 2011-11-21 DIAGNOSIS — E785 Hyperlipidemia, unspecified: Secondary | ICD-10-CM

## 2011-11-21 DIAGNOSIS — Z8614 Personal history of Methicillin resistant Staphylococcus aureus infection: Secondary | ICD-10-CM

## 2011-11-21 DIAGNOSIS — R7309 Other abnormal glucose: Secondary | ICD-10-CM

## 2011-11-21 DIAGNOSIS — N289 Disorder of kidney and ureter, unspecified: Secondary | ICD-10-CM

## 2011-11-21 DIAGNOSIS — Z888 Allergy status to other drugs, medicaments and biological substances status: Secondary | ICD-10-CM

## 2011-11-21 DIAGNOSIS — N12 Tubulo-interstitial nephritis, not specified as acute or chronic: Secondary | ICD-10-CM

## 2011-11-21 DIAGNOSIS — E669 Obesity, unspecified: Secondary | ICD-10-CM

## 2011-11-21 DIAGNOSIS — D649 Anemia, unspecified: Secondary | ICD-10-CM | POA: Diagnosis present

## 2011-11-21 DIAGNOSIS — L03119 Cellulitis of unspecified part of limb: Secondary | ICD-10-CM

## 2011-11-21 DIAGNOSIS — R599 Enlarged lymph nodes, unspecified: Secondary | ICD-10-CM

## 2011-11-21 DIAGNOSIS — E871 Hypo-osmolality and hyponatremia: Secondary | ICD-10-CM | POA: Diagnosis present

## 2011-11-21 DIAGNOSIS — N189 Chronic kidney disease, unspecified: Secondary | ICD-10-CM | POA: Diagnosis present

## 2011-11-21 DIAGNOSIS — D893 Immune reconstitution syndrome: Secondary | ICD-10-CM

## 2011-11-21 DIAGNOSIS — B20 Human immunodeficiency virus [HIV] disease: Principal | ICD-10-CM | POA: Insufficient documentation

## 2011-11-21 DIAGNOSIS — R161 Splenomegaly, not elsewhere classified: Secondary | ICD-10-CM | POA: Diagnosis present

## 2011-11-21 DIAGNOSIS — Z8719 Personal history of other diseases of the digestive system: Secondary | ICD-10-CM

## 2011-11-21 DIAGNOSIS — N200 Calculus of kidney: Secondary | ICD-10-CM

## 2011-11-21 DIAGNOSIS — E538 Deficiency of other specified B group vitamins: Secondary | ICD-10-CM | POA: Diagnosis present

## 2011-11-21 DIAGNOSIS — E291 Testicular hypofunction: Secondary | ICD-10-CM

## 2011-11-21 LAB — CBC
HCT: 26.1 % — ABNORMAL LOW (ref 39.0–52.0)
Hemoglobin: 8.5 g/dL — ABNORMAL LOW (ref 13.0–17.0)
MCH: 28.6 pg (ref 26.0–34.0)
MCHC: 32.6 g/dL (ref 30.0–36.0)

## 2011-11-21 LAB — BASIC METABOLIC PANEL
BUN: 26 mg/dL — ABNORMAL HIGH (ref 6–23)
CO2: 20 mEq/L (ref 19–32)
Chloride: 99 mEq/L (ref 96–112)
Creatinine, Ser: 1.66 mg/dL — ABNORMAL HIGH (ref 0.50–1.35)
GFR calc Af Amer: 54 mL/min — ABNORMAL LOW (ref >90)
Glucose, Bld: 165 mg/dL — ABNORMAL HIGH (ref 70–99)

## 2011-11-21 LAB — URINE MICROSCOPIC-ADD ON

## 2011-11-21 LAB — SODIUM, URINE, RANDOM: Sodium, Ur: 21 mEq/L

## 2011-11-21 LAB — URINALYSIS, ROUTINE W REFLEX MICROSCOPIC
Glucose, UA: NEGATIVE mg/dL
Ketones, ur: 15 mg/dL — AB
pH: 5.5 (ref 5.0–8.0)

## 2011-11-21 LAB — DIFFERENTIAL
Basophils Relative: 0 % (ref 0–1)
Lymphs Abs: 1.3 10*3/uL (ref 0.7–4.0)
Monocytes Absolute: 1.1 10*3/uL — ABNORMAL HIGH (ref 0.1–1.0)
Monocytes Relative: 24 % — ABNORMAL HIGH (ref 3–12)
Neutro Abs: 2.1 10*3/uL (ref 1.7–7.7)

## 2011-11-21 LAB — CREATININE, URINE, RANDOM: Creatinine, Urine: 388.01 mg/dL

## 2011-11-21 MED ORDER — ENOXAPARIN SODIUM 40 MG/0.4ML ~~LOC~~ SOLN
40.0000 mg | Freq: Every day | SUBCUTANEOUS | Status: DC
  Administered 2011-11-22 – 2011-11-27 (×5): 40 mg via SUBCUTANEOUS
  Filled 2011-11-21 (×7): qty 0.4

## 2011-11-21 MED ORDER — DAPSONE 100 MG PO TABS
100.0000 mg | ORAL_TABLET | Freq: Every day | ORAL | Status: DC
  Administered 2011-11-22 – 2011-11-28 (×7): 100 mg via ORAL
  Filled 2011-11-21 (×7): qty 1

## 2011-11-21 MED ORDER — KETOROLAC TROMETHAMINE 30 MG/ML IJ SOLN
30.0000 mg | Freq: Once | INTRAMUSCULAR | Status: AC
  Administered 2011-11-21: 30 mg via INTRAVENOUS
  Filled 2011-11-21: qty 1

## 2011-11-21 MED ORDER — ONDANSETRON HCL 4 MG/2ML IJ SOLN: 4.0000 mg | Freq: Four times a day (QID) | INTRAMUSCULAR | Status: DC | PRN

## 2011-11-21 MED ORDER — HYDROMORPHONE HCL PF 1 MG/ML IJ SOLN
1.0000 mg | Freq: Once | INTRAMUSCULAR | Status: AC
  Administered 2011-11-21: 1 mg via INTRAVENOUS
  Filled 2011-11-21: qty 1

## 2011-11-21 MED ORDER — HYDROCODONE-ACETAMINOPHEN 5-325 MG PO TABS
1.0000 | ORAL_TABLET | ORAL | Status: DC | PRN
  Administered 2011-11-22 – 2011-11-23 (×2): 1 via ORAL
  Filled 2011-11-21: qty 1
  Filled 2011-11-21: qty 2

## 2011-11-21 MED ORDER — LABETALOL HCL 200 MG PO TABS
200.0000 mg | ORAL_TABLET | Freq: Two times a day (BID) | ORAL | Status: DC
  Administered 2011-11-21 – 2011-11-24 (×7): 200 mg via ORAL
  Filled 2011-11-21 (×9): qty 1

## 2011-11-21 MED ORDER — SODIUM CHLORIDE 0.9 % IJ SOLN
3.0000 mL | Freq: Two times a day (BID) | INTRAMUSCULAR | Status: DC
  Administered 2011-11-22 – 2011-11-27 (×5): 3 mL via INTRAVENOUS

## 2011-11-21 MED ORDER — ONDANSETRON HCL 4 MG PO TABS: 4.0000 mg | ORAL_TABLET | Freq: Four times a day (QID) | ORAL | Status: DC | PRN

## 2011-11-21 MED ORDER — ACETAMINOPHEN 325 MG PO TABS
650.0000 mg | ORAL_TABLET | Freq: Four times a day (QID) | ORAL | Status: DC | PRN
  Administered 2011-11-21 – 2011-11-22 (×3): 650 mg via ORAL
  Filled 2011-11-21 (×4): qty 2

## 2011-11-21 MED ORDER — EFAVIRENZ-EMTRICITAB-TENOFOVIR 600-200-300 MG PO TABS
1.0000 | ORAL_TABLET | Freq: Every day | ORAL | Status: DC
  Administered 2011-11-21 – 2011-11-27 (×7): 1 via ORAL
  Filled 2011-11-21 (×9): qty 1

## 2011-11-21 MED ORDER — AMLODIPINE BESYLATE 10 MG PO TABS
10.0000 mg | ORAL_TABLET | Freq: Every day | ORAL | Status: DC
  Administered 2011-11-22 – 2011-11-28 (×6): 10 mg via ORAL
  Filled 2011-11-21 (×7): qty 1

## 2011-11-21 MED ORDER — ONDANSETRON HCL 4 MG/2ML IJ SOLN
4.0000 mg | Freq: Once | INTRAMUSCULAR | Status: AC
  Administered 2011-11-21: 4 mg via INTRAVENOUS
  Filled 2011-11-21: qty 2

## 2011-11-21 MED ORDER — ACETAMINOPHEN 650 MG RE SUPP: 650.0000 mg | Freq: Four times a day (QID) | RECTAL | Status: DC | PRN

## 2011-11-21 MED ORDER — SALINE SPRAY 0.65 % NA SOLN
1.0000 | NASAL | Status: DC | PRN
  Filled 2011-11-21: qty 44

## 2011-11-21 MED ORDER — SODIUM CHLORIDE 0.9 % IV BOLUS (SEPSIS)
1000.0000 mL | Freq: Once | INTRAVENOUS | Status: AC
  Administered 2011-11-21: 1000 mL via INTRAVENOUS

## 2011-11-21 NOTE — ED Notes (Signed)
Reports not feeling well since mid January, having cold and flu like symptoms. Was started on an antibiotic on tues and still not feeling any better, running fevers, denies any n/v/d.

## 2011-11-21 NOTE — H&P (Signed)
Hospital Admission Note Date: 11/21/2011  Patient name: Patrick [name] Medical record number: [id number] Date of birth: [birth year] Age: 51 y.o. Gender: male PCP: Janalyn Harder, MD, MD  Medical Service: Internal Medicine Teaching Service - [facility]  Attending physician:  Dr. Drue Second    1st Contact: Dr. Abner Greenspan   Pager: [phone] 2nd Contact: Dr. Eben Burow    Pager:  [phone] After 5 pm or weekends: 1st Contact:  Pager: [phone] 2nd Contact:  Pager: [phone]  Chief Complaint: fever, swollen lymph nodes,  History of Present Illness:  [name] is a 50 year old male with past medical history significant for HIV (CD4 240, viral load undetectable as of 10/06/2011) characterized by excellent viral suppression but poor immunologic response to ART. Over the past year, he has experienced persistent waxing and waning lymphadenopathy and fever.    He reports difficulties with sinus congestion and dry cough over the past month and feels as though his fever and LAD have significantly worsened over the past week.  He was seen at the [facility] on 11/17/2011 at which time he was prescribed a Z-Pak for treatment of possible bacterial sinusitis; a chest x-ray was also obtained at that time that was unrevealing for any acute or concerning process.  The patient feels his symptoms have worsened despite empiric antibiotic treatment. He reports nightly febrile episodes with a Tmax of 102.7 at home.  He admits to "cold sweats" during the day.  He denies chest pain and shortness of breath but reports significantly increased fatigue.  He reports a mild frontal headache as well as persistent sinus congestion.  He denies visual changes, vision loss, syncope, dizziness, tingling, numbness, or weakness. He denies any sore throat, productive cough, and hemoptysis.   He denies abdominal pain, nausea, vomiting, and diarrhea.  He denies bright red blood per rectum, dark tarry stools, constipation, dysuria, hematuria, or urinary  urgency.  He has not noted any other new or associated symptoms.    He has not missed any doses of Atripla.  Meds: Atripla 1 tab by mouth each bedtime Labetalol 200 mg 1 tab by mouth twice a day Dapsone 100 mg by mouth daily Azithromycin (Z-pak) 5 day course prescribed on 11/17/11   Allergies: Codeine and Sulfonamide derivatives Past Medical History  Diagnosis Date  . Hypertension   . Renal insufficiency     hydrated cr 1.5 gfr 48  . HIV (human immunodeficiency virus infection)   . Hypogonadism male    Past Surgical History  Procedure Date  . Lithotripsy 2010  . Appendectomy 2001    via midline incision  . Lymph node biopsy 2010, 2011    bilateral... by dr Derrell Lolling   Family History  Problem Relation Age of Onset  . Adopted: Yes   History   Social History  . Marital Status: Single    Spouse Name: N/A    Number of Children: N/A  . Years of Education: N/A   Occupational History  . Not on file.   Social History Main Topics  . Smoking status: Never Smoker   . Smokeless tobacco: Never Used  . Alcohol Use: 0.5 oz/week    1 drink(s) per week     occasionallt  . Drug Use: No  . Sexually Active: Yes -- Male partner(s)    Birth Control/ Protection: Condom     refused condoms   Other Topics Concern  . Not on file   Social History Narrative  . No narrative on file    Review of Systems: Pertinent  items are noted in HPI.  Physical Exam: Blood pressure 121/76, pulse 101, temperature 97.9 F (36.6 C), temperature source Oral, resp. rate 22, SpO2 98.00%. VItal signs reviewed and stable. GEN: No apparent distress.  Alert and oriented x 3.  Pleasant, conversant, and cooperative to exam. HEENT: head is autraumatic and normocephalic.  Neck is supple.  No JVD or carotid bruits.  Vision intact.  EOMI.  PERRLA.  Sclerae anicteric. Conjunctivae without pallor or injection. Mucous membranes are moist.  Oropharynx is without erythema, exudates, or other abnormal lesions.  Fundoscopic exam does not reveal papilledema or other abnormality. RESP:  Lungs are clear to ascultation bilaterally with good air movement.  No wheezes, ronchi, or rubs. CARDIOVASCULAR: regular rate, normal rhythm.  Clear S1, S2, no murmurs, gallops, or rubs. ABDOMEN: soft, non-tender, non-distended.  Bowels sounds present in all quadrants and normoactive.  No palpable masses. EXT: warm and dry.  Peripheral pulses equal, intact, and +2 globally.  No clubbing or cyanosis.  No edema in bilateral lower extremities. SKIN: warm and dry with normal turgor.  No rashes or abnormal lesions observed. NEURO: CN II-XII grossly intact.  Muscle strength +5/5 in bilateral upper and lower extremities.  Sensation is grossly intact.  No focal deficit. LYMPH: extensive lymphadenopathy present in bilateral anterior cervical chains as well as supraclavicular region (left greater than right).  LAD also noted in bilaterall axilla and groin (R>L). All nodes are well circumscribed, nontender, non-erythematous, and freely mobile.   Lab results: Basic Metabolic Panel:  [facility] 11/21/11 1748  NA 130*  K 4.8  CL 99  CO2 20  GLUCOSE 165*  BUN 26*  CREATININE 1.66*  CALCIUM 9.4  MG --  PHOS --   CBC:  Basename 11/21/11 1748  WBC 4.5  NEUTROABS 2.1  HGB 8.5*  HCT 26.1*  MCV 87.9  PLT 121*   Urine Drug Screen: Drugs of Abuse     Component Value Date/Time   LABOPIA NONE DETECTED 11/19/2010 0348   COCAINSCRNUR NONE DETECTED 11/19/2010 0348   LABBENZ NONE DETECTED 11/19/2010 0348   AMPHETMU NONE DETECTED 11/19/2010 0348   THCU NONE DETECTED 11/19/2010 0348   LABBARB  Value: NONE DETECTED        DRUG SCREEN FOR MEDICAL PURPOSES ONLY.  IF CONFIRMATION IS NEEDED FOR ANY PURPOSE, NOTIFY LAB WITHIN 5 DAYS.        LOWEST DETECTABLE LIMITS FOR URINE DRUG SCREEN Drug Class       Cutoff (ng/mL) Amphetamine      1000 Barbiturate      200 Benzodiazepine   200 Tricyclics       300 Opiates          300 Cocaine          300  THC              50 11/19/2010 0348      Imaging results:  Dg Chest 2 View  11/21/2011  *RADIOLOGY REPORT*  Clinical Data: Fever.  Nausea.  Lymphadenopathy in the neck, groin, and axilla.  CHEST - 2 VIEW 11/21/2011:  Comparison: Two-view chest x-ray 11/17/2011, 10/02/2011, 12/25/2010, 11/28/2009 [facility].  Findings: Cardiac silhouette upper normal in size, unchanged. Hilar and mediastinal contours otherwise unremarkable.  Lungs clear.  Bronchovascular markings normal.  Pulmonary vascularity normal.  No pneumothorax.  No pleural effusions.  Mild eventration of the right anterior hemidiaphragm, unchanged.  Visualized bony thorax intact.  IMPRESSION: No acute cardiopulmonary disease.  Stable examination.  Original Report Authenticated  By: Arnell Sieving, M.D.      Assessment & Plan by Problem:  Fever and lymphadenopathy:  Mr. Cass has experienced recurrent lymphadenopathy with fever over the past year.  His workup to date, including excisional lymph node biopsy, has been non-diagnostic.  IRIS seems the most likely etiology at this point with perhaps a "flare" of symptoms resulting from an acute sinus infection.  Blood cultures obtained from 11/17/11 are without growth in 2/2 bottles. - Follow up on blood cultures obtained in ER - Discuss case with ID in the am  - Unclear if it would be useful to repeat excisional LN biopsy; will discuss with team in am - Tylenol for symptomatic relief  Acute anemia, normocytic: patient''s Hgb dropped from 10.0 on 11/06/10 to 8.5 on admission today.  His history does not suggest GI or other abnormal blood loss.  Iatrogenic blood loss and hemodilution seem unlikely as his last draw was performed on 2/19 and he has not received a significant about of IVF. - Repeat CBC in the am - Check coags - Check FOBT  - Check orthostatics  - Follow up on LFTs to assess for hyperbilirubinemia (plan for hemolytic work up, if indicated)  Acute exacerbation of  chronic renal insufficiency: patient's Cr is slightly elevated above his baseline.  It seems most probable that this is the result from intravascular depletion 2/2 decreased fluid intake.  His U/A is unremarkable. - Monitor UOP - Repeat BMET in am - Check UNa and UCr  Hyponatremia:  The patient's Na is only slightly low at 130.  He is currently asymptomatic.  Mr. Bertran appears euvolemic on exam, however he may be slightly hypovolemic given elevated BUN/Cr and SBPs in the 110-120s.   - Repeat BMET in am - Follow up on orthostatics - Follow up UNa and FeNa - If patient remains hyponatremic, would proceed with further work up  HIV: will continue current ART regimen.   DVT prophylaxis: lovenox  Signed: Ludy Messamore 11/21/2011, 9:10 PM

## 2011-11-21 NOTE — ED Provider Notes (Signed)
History     CSN: [id number]  Arrival date & time 11/21/11  1622   First MD Initiated Contact with Patient 11/21/11 1711      Chief Complaint  Patient presents with  . Influenza    (Consider location/radiation/quality/duration/timing/severity/associated sxs/prior treatment) Patient is a 51 y.o. male presenting with URI. The history is provided by the patient. No language interpreter was used.  URI The primary symptoms include fever, fatigue, headaches, sore throat, cough, myalgias and arthralgias. Primary symptoms do not include abdominal pain, nausea or vomiting. The current episode started more than 1 week ago. This is a recurrent problem. The problem has been gradually worsening.  The fever began 6 to 7 days ago. The fever has been gradually worsening since its onset. The maximum temperature recorded prior to his arrival was 102 to 102.9 F. The temperature was taken by an oral thermometer.  The fatigue began more than 1 week ago. The fatigue has been worsening since its onset.  The headache began more than 2 days ago. The headache developed gradually. The headache is present intermittently. The headache is not associated with eye pain, neck stiffness, paresthesias or weakness.  The sore throat began more than 2 days ago. The sore throat has been unchanged since its onset. The sore throat is moderate in intensity. The sore throat is not accompanied by trouble swallowing.  The myalgias are not associated with weakness.  Symptoms associated with the illness include chills, congestion and rhinorrhea. Risk factors for severe complications from URI include immunosuppression.    Past Medical History  Diagnosis Date  . Hypertension   . Renal insufficiency     hydrated cr 1.5 gfr 48  . HIV (human immunodeficiency virus infection)   . Hypogonadism male     Past Surgical History  Procedure Date  . Lithotripsy 2010  . Appendectomy 2001    via midline incision  . Lymph node biopsy 2010,  2011    bilateral... by dr Derrell Lolling    Family History  Problem Relation Age of Onset  . Adopted: Yes    History  Substance Use Topics  . Smoking status: Never Smoker   . Smokeless tobacco: Never Used  . Alcohol Use: 0.5 oz/week    1 drink(s) per week     occasionallt      Review of Systems  Constitutional: Positive for fever, chills, activity change and fatigue. Negative for appetite change.  HENT: Positive for congestion, sore throat and rhinorrhea. Negative for trouble swallowing, neck pain and neck stiffness.   Eyes: Negative for pain.  Respiratory: Positive for cough. Negative for shortness of breath.   Cardiovascular: Negative for chest pain and palpitations.  Gastrointestinal: Negative for nausea, vomiting and abdominal pain.  Genitourinary: Negative for dysuria, urgency, frequency and flank pain.  Musculoskeletal: Positive for myalgias and arthralgias.  Neurological: Positive for headaches. Negative for dizziness, weakness, light-headedness, numbness and paresthesias.  All other systems reviewed and are negative.    Allergies  Codeine and Sulfonamide derivatives  Home Medications   Current Outpatient Rx  Name Route Sig Dispense Refill  . AMLODIPINE BESYLATE 10 MG PO TABS Oral Take 10 mg by mouth daily.    . AZITHROMYCIN 250 MG PO TABS Oral Take 250-500 mg by mouth daily. Take 2 tablets (500 mg) on  Day 1,  followed by 1 tablet (250 mg) once daily on Days 2 through 5. Today was the last dose    . DAPSONE 100 MG PO TABS Oral Take 100 mg  by mouth daily.    . EFAVIRENZ-EMTRICITAB-TENOFOVIR 600-200-300 MG PO TABS Oral Take 1 tablet by mouth at bedtime.    Marland Kitchen LABETALOL HCL 200 MG PO TABS Oral Take 200 mg by mouth 2 (two) times daily.      BP 120/64  Pulse 101  Temp(Src) 99.7 F (37.6 C) (Oral)  Resp 22  SpO2 98%  Physical Exam  Nursing note and vitals reviewed. Constitutional: He is oriented to person, place, and time. He appears well-developed and  well-nourished.  HENT:  Head: Normocephalic and atraumatic.  Right Ear: External ear normal.  Left Ear: External ear normal.  Mouth/Throat: Oropharynx is clear and moist.  Eyes: Conjunctivae and EOM are normal. Pupils are equal, round, and reactive to light.  Neck: Normal range of motion. Neck supple.  Cardiovascular: Normal rate, regular rhythm, normal heart sounds and intact distal pulses.  Exam reveals no gallop and no friction rub.   No murmur heard. Pulmonary/Chest: Effort normal and breath sounds normal. No respiratory distress. He exhibits no tenderness.  Abdominal: Soft. Bowel sounds are normal. There is no tenderness.  Musculoskeletal: Normal range of motion. He exhibits no tenderness.       Inguinal adenopathy  Lymphadenopathy:    He has cervical adenopathy.  Neurological: He is alert and oriented to person, place, and time. No cranial nerve deficit.  Skin: Skin is warm and dry. No rash noted.    ED Course  Procedures (including critical care time)  Labs Reviewed  CBC - Abnormal; Notable for the following:    RBC 2.97 (*)    Hemoglobin 8.5 (*)    HCT 26.1 (*)    RDW 16.6 (*)    Platelets 121 (*)    All other components within normal limits  DIFFERENTIAL - Abnormal; Notable for the following:    Monocytes Relative 24 (*)    Monocytes Absolute 1.1 (*)    All other components within normal limits  BASIC METABOLIC PANEL - Abnormal; Notable for the following:    Sodium 130 (*)    Glucose, Bld 165 (*)    BUN 26 (*)    Creatinine, Ser 1.66 (*)    GFR calc non Af Amer 47 (*)    GFR calc Af Amer 54 (*)    All other components within normal limits  LACTIC ACID, PLASMA  URINALYSIS, ROUTINE W REFLEX MICROSCOPIC  CULTURE, BLOOD (ROUTINE X 2)  CULTURE, BLOOD (ROUTINE X 2)   Dg Chest 2 View  11/21/2011  *RADIOLOGY REPORT*  Clinical Data: Fever.  Nausea.  Lymphadenopathy in the neck, groin, and axilla.  CHEST - 2 VIEW 11/21/2011:  Comparison: Two-view chest x-ray  11/17/2011, 10/02/2011, 12/25/2010, 11/28/2009 [facility].  Findings: Cardiac silhouette upper normal in size, unchanged. Hilar and mediastinal contours otherwise unremarkable.  Lungs clear.  Bronchovascular markings normal.  Pulmonary vascularity normal.  No pneumothorax.  No pleural effusions.  Mild eventration of the right anterior hemidiaphragm, unchanged.  Visualized bony thorax intact.  IMPRESSION: No acute cardiopulmonary disease.  Stable examination.  Original Report Authenticated By: Arnell Sieving, M.D.     1. HIV (human immunodeficiency virus infection)   2. Immune reconstitution inflammatory syndrome   3. Viral illness   4. Anemia       MDM  Patient's had multiple outpatient visits for this illness. He continues to be ill. Has a febrile illness likely viral. His CD4 count is 240 therefore an AIDS defining illness is unlikely. I did not put him on antibiotics  as there is no evidence of infection. Laboratory studies are relatively unremarkable except for a slightly increased creatinine as well as a hemoglobin of 8.5. Discussed with the internal medicine teaching service who will admit the patient for further evaluation        Dayton Bailiff, MD 11/21/11 1947

## 2011-11-22 DIAGNOSIS — B20 Human immunodeficiency virus [HIV] disease: Secondary | ICD-10-CM

## 2011-11-22 LAB — BASIC METABOLIC PANEL
CO2: 22 mEq/L (ref 19–32)
Chloride: 105 mEq/L (ref 96–112)
Creatinine, Ser: 1.65 mg/dL — ABNORMAL HIGH (ref 0.50–1.35)
Potassium: 4.7 mEq/L (ref 3.5–5.1)
Sodium: 135 mEq/L (ref 135–145)

## 2011-11-22 LAB — HEPATIC FUNCTION PANEL
ALT: 7 U/L (ref 0–53)
Alkaline Phosphatase: 50 U/L (ref 39–117)
Total Bilirubin: 0.2 mg/dL — ABNORMAL LOW (ref 0.3–1.2)

## 2011-11-22 LAB — CBC
Platelets: 121 10*3/uL — ABNORMAL LOW (ref 150–400)
RBC: 2.71 MIL/uL — ABNORMAL LOW (ref 4.22–5.81)
WBC: 3.5 10*3/uL — ABNORMAL LOW (ref 4.0–10.5)

## 2011-11-22 LAB — MRSA PCR SCREENING: MRSA by PCR: NEGATIVE

## 2011-11-22 MED ORDER — SODIUM CHLORIDE 0.9 % IV SOLN
INTRAVENOUS | Status: AC
  Administered 2011-11-22 (×2): via INTRAVENOUS

## 2011-11-22 MED ORDER — HYDROCOD POLST-CHLORPHEN POLST 10-8 MG/5ML PO LQCR
5.0000 mL | Freq: Two times a day (BID) | ORAL | Status: DC
  Administered 2011-11-22 – 2011-11-28 (×12): 5 mL via ORAL
  Filled 2011-11-22 (×13): qty 5

## 2011-11-22 MED ORDER — HYDROCOD POLST-CHLORPHEN POLST 10-8 MG/5ML PO LQCR
5.0000 mL | Freq: Once | ORAL | Status: AC
  Administered 2011-11-22: 5 mL via ORAL

## 2011-11-22 NOTE — Progress Notes (Signed)
It was not necessary to swab pt for flu pcr per Md on call.

## 2011-11-22 NOTE — Consult Note (Signed)
Infectious Diseases Initial Consultation  Reason for Consultation:  042 patient with fever and lymphadenopathy.   HPI: Patrick [name] is a 51 y.o. male with HIV well controlled over the last 2-3 years on Atripla with low or undetectable viral load and a slowly rising CD4 count who has had difficulty with lymphadenopathy over the last year.  He was recently seen in the ID clinic with vague symptoms and treated for a sinus infection but no real improvement.  He has noted over the last week to have fever up to 102 and worsening of his cervical lad.  He also gives about a 10 lb weight gain and a abdominal fullness.  + night sweats.  He previously was worked up for this about 1 year ago which included a biopsy in his right axilla and was non-diagnostic, including for bacterial, fungal and AFB cultures.  A CT abdomen last year showed diffuse LAD as well in the inguinal, iliac and retroperitoneal area.    Past Medical History  Diagnosis Date  . Hypertension   . Renal insufficiency     hydrated cr 1.5 gfr 48  . HIV (human immunodeficiency virus infection)   . Hypogonadism male     Allergies:  Allergies  Allergen Reactions  . Codeine Itching, Rash and Other (See Comments)    "crazy dreams"  . Sulfonamide Derivatives Rash    Current antibiotics:   MEDICATIONS:    . amLODipine  10 mg Oral Daily  . dapsone  100 mg Oral Daily  . efavirenz-emtrictabine-tenofovir  1 tablet Oral QHS  . enoxaparin  40 mg Subcutaneous Daily  .  HYDROmorphone (DILAUDID) injection  1 mg Intravenous Once  . ketorolac  30 mg Intravenous Once  . labetalol  200 mg Oral BID  . ondansetron  4 mg Intravenous Once  . sodium chloride  1,000 mL Intravenous Once  . sodium chloride  3 mL Intravenous Q12H    History  Substance Use Topics  . Smoking status: Never Smoker   . Smokeless tobacco: Never Used  . Alcohol Use: 0.5 oz/week    1 drink(s) per week     occasionallt    Family History  Problem Relation Age of  Onset  . Adopted: Yes    Review of Systems - 12 pt ROS negative except as per the HPI  OBJECTIVE: Temp:  [97.5 F (36.4 C)-99.7 F (37.6 C)] 97.5 F (36.4 C) (02/24 1610) Pulse Rate:  [79-101] 80  (02/24 0614) Resp:  [20-22] 20  (02/24 0614) BP: (109-121)/(51-76) 110/54 mmHg (02/24 0614) SpO2:  [93 %-98 %] 93 % (02/24 0614) Weight:  [233 lb 0.4 oz (105.7 kg)] 233 lb 0.4 oz (105.7 kg) (02/24 0612) General appearance: alert, cooperative and no distress Eyes: conjunctivae/corneas clear. PERRL, EOM's intact. Fundi benign. Resp: clear to auscultation bilaterally Cardio: regular rate and rhythm, S1, S2 normal, no murmur, click, rub or gallop GI: soft, ntnd, + bs, + splenomegaly, no hepatomegaly Extremities: extremities normal, atraumatic, no cyanosis or edema Skin: Skin color, texture, turgor normal. No rashes or lesions Lymph nodes: Cervical adenopathy: noted anteriorly, right > left but prominent bilateral, Axillary adenopathy: small right lad, Supraclavicular adenopathy: + possible right lad and Inguinal adenopathy: right > left  LABS: Results for orders placed during the hospital encounter of 11/21/11 (from the past 48 hour(s))  CBC     Status: Abnormal   Collection Time   11/21/11  5:48 PM      Component Value Range Comment   WBC  4.5  4.0 - 10.5 (K/uL)    RBC 2.97 (*) 4.22 - 5.81 (MIL/uL)    Hemoglobin 8.5 (*) 13.0 - 17.0 (g/dL)    HCT 16.1 (*) 09.6 - 52.0 (%)    MCV 87.9  78.0 - 100.0 (fL)    MCH 28.6  26.0 - 34.0 (pg)    MCHC 32.6  30.0 - 36.0 (g/dL)    RDW 04.5 (*) 40.9 - 15.5 (%)    Platelets 121 (*) 150 - 400 (K/uL)   DIFFERENTIAL     Status: Abnormal   Collection Time   11/21/11  5:48 PM      Component Value Range Comment   Neutrophils Relative 46  43 - 77 (%)    Neutro Abs 2.1  1.7 - 7.7 (K/uL)    Lymphocytes Relative 28  12 - 46 (%)    Lymphs Abs 1.3  0.7 - 4.0 (K/uL)    Monocytes Relative 24 (*) 3 - 12 (%)    Monocytes Absolute 1.1 (*) 0.1 - 1.0 (K/uL)     Eosinophils Relative 2  0 - 5 (%)    Eosinophils Absolute 0.1  0.0 - 0.7 (K/uL)    Basophils Relative 0  0 - 1 (%)    Basophils Absolute 0.0  0.0 - 0.1 (K/uL)   BASIC METABOLIC PANEL     Status: Abnormal   Collection Time   11/21/11  5:48 PM      Component Value Range Comment   Sodium 130 (*) 135 - 145 (mEq/L)    Potassium 4.8  3.5 - 5.1 (mEq/L)    Chloride 99  96 - 112 (mEq/L)    CO2 20  19 - 32 (mEq/L)    Glucose, Bld 165 (*) 70 - 99 (mg/dL)    BUN 26 (*) 6 - 23 (mg/dL)    Creatinine, Ser 8.11 (*) 0.50 - 1.35 (mg/dL)    Calcium 9.4  8.4 - 10.5 (mg/dL)    GFR calc non Af Amer 47 (*) >90 (mL/min)    GFR calc Af Amer 54 (*) >90 (mL/min)   LACTIC ACID, PLASMA     Status: Normal   Collection Time   11/21/11  6:10 PM      Component Value Range Comment   Lactic Acid, Venous 0.7  0.5 - 2.2 (mmol/L)   URINALYSIS, ROUTINE W REFLEX MICROSCOPIC     Status: Abnormal   Collection Time   11/21/11 10:46 PM      Component Value Range Comment   Color, Urine YELLOW  YELLOW     APPearance CLOUDY (*) CLEAR     Specific Gravity, Urine 1.027  1.005 - 1.030     pH 5.5  5.0 - 8.0     Glucose, UA NEGATIVE  NEGATIVE (mg/dL)    Hgb urine dipstick TRACE (*) NEGATIVE     Bilirubin Urine SMALL (*) NEGATIVE     Ketones, ur 15 (*) NEGATIVE (mg/dL)    Protein, ur 914 (*) NEGATIVE (mg/dL)    Urobilinogen, UA 0.2  0.0 - 1.0 (mg/dL)    Nitrite NEGATIVE  NEGATIVE     Leukocytes, UA NEGATIVE  NEGATIVE    SODIUM, URINE, RANDOM     Status: Normal   Collection Time   11/21/11 10:46 PM      Component Value Range Comment   Sodium, Ur 21     CREATININE, URINE, RANDOM     Status: Normal   Collection Time   11/21/11 10:46 PM  Component Value Range Comment   Creatinine, Urine 388.01     URINE MICROSCOPIC-ADD ON     Status: Abnormal   Collection Time   11/21/11 10:46 PM      Component Value Range Comment   Squamous Epithelial / LPF RARE  RARE     WBC, UA 0-2  <3 (WBC/hpf)    RBC / HPF 0-2  <3 (RBC/hpf)     Bacteria, UA RARE  RARE     Casts HYALINE CASTS (*) NEGATIVE  GRANULAR CAST   Urine-Other AMORPHOUS URATES/PHOSPHATES     MRSA PCR SCREENING     Status: Normal   Collection Time   11/21/11 11:07 PM      Component Value Range Comment   MRSA by PCR NEGATIVE  NEGATIVE    BASIC METABOLIC PANEL     Status: Abnormal   Collection Time   11/22/11  6:10 AM      Component Value Range Comment   Sodium 135  135 - 145 (mEq/L)    Potassium 4.7  3.5 - 5.1 (mEq/L)    Chloride 105  96 - 112 (mEq/L)    CO2 22  19 - 32 (mEq/L)    Glucose, Bld 192 (*) 70 - 99 (mg/dL)    BUN 27 (*) 6 - 23 (mg/dL)    Creatinine, Ser 0.98 (*) 0.50 - 1.35 (mg/dL)    Calcium 9.1  8.4 - 10.5 (mg/dL)    GFR calc non Af Amer 47 (*) >90 (mL/min)    GFR calc Af Amer 54 (*) >90 (mL/min)   CBC     Status: Abnormal   Collection Time   11/22/11  6:10 AM      Component Value Range Comment   WBC 3.5 (*) 4.0 - 10.5 (K/uL)    RBC 2.71 (*) 4.22 - 5.81 (MIL/uL)    Hemoglobin 7.9 (*) 13.0 - 17.0 (g/dL)    HCT 11.9 (*) 14.7 - 52.0 (%)    MCV 89.3  78.0 - 100.0 (fL)    MCH 29.2  26.0 - 34.0 (pg)    MCHC 32.6  30.0 - 36.0 (g/dL)    RDW 82.9 (*) 56.2 - 15.5 (%)    Platelets 121 (*) 150 - 400 (K/uL)   HEPATIC FUNCTION PANEL     Status: Abnormal   Collection Time   11/22/11  6:10 AM      Component Value Range Comment   Total Protein 7.5  6.0 - 8.3 (g/dL)    Albumin 2.5 (*) 3.5 - 5.2 (g/dL)    AST 8  0 - 37 (U/L)    ALT 7  0 - 53 (U/L)    Alkaline Phosphatase 50  39 - 117 (U/L)    Total Bilirubin 0.2 (*) 0.3 - 1.2 (mg/dL)    Bilirubin, Direct <1.3  0.0 - 0.3 (mg/dL)    Indirect Bilirubin NOT CALCULATED  0.3 - 0.9 (mg/dL)     MICRO:  IMAGING: Dg Chest 2 View  11/21/2011  *RADIOLOGY REPORT*  Clinical Data: Fever.  Nausea.  Lymphadenopathy in the neck, groin, and axilla.  CHEST - 2 VIEW 11/21/2011:  Comparison: Two-view chest x-ray 11/17/2011, 10/02/2011, 12/25/2010, 11/28/2009 [facility].  Findings: Cardiac silhouette upper  normal in size, unchanged. Hilar and mediastinal contours otherwise unremarkable.  Lungs clear.  Bronchovascular markings normal.  Pulmonary vascularity normal.  No pneumothorax.  No pleural effusions.  Mild eventration of the right anterior hemidiaphragm, unchanged.  Visualized bony thorax intact.  IMPRESSION: No  acute cardiopulmonary disease.  Stable examination.  Original Report Authenticated By: Arnell Sieving, M.D.    HISTORICAL MICRO/IMAGING  Assessment/Plan:  51 yo with HIV and persistent LAD, fever.  1) LAD - DDx includes infectious including fungal or mycobacterial as well as hematologic like lymphoma, particularly with the pancytopenia. Other etiologies such as IRIs possible but less likely and less likely OIs with his CD4 over 200 for some time now.  I will send off some blood cultures including AFB and fungal, quantiferon gold, and viral load and CD4 to assure no resistance and higher risk of OIs.  I think ultimately though he may need an open biopsy (possibly inguinal?) to consider lymphoma or NTM disease.  Less likely also would be sarcoid (no hilar lad on CXR).  A bone marrow also may help in the diagnosis.

## 2011-11-22 NOTE — Progress Notes (Addendum)
Subjective: Denies any chest pain, shortness of breath. Has been coughing up which is persistent since  Objective: Vital signs in last 24 hours: Filed Vitals:   11/22/11 0612 11/22/11 0613 11/22/11 0614 11/22/11 1409  BP: 109/51 109/52 110/54 103/57  Pulse: 79 80 80 91  Temp: 97.5 F (36.4 C) 97.5 F (36.4 C) 97.5 F (36.4 C) 98.1 F (36.7 C)  TempSrc: Oral Oral Oral Oral  Resp: 20 20 20 18   Height:      Weight: 233 lb 0.4 oz (105.7 kg)     SpO2: 93% 93% 93% 94%   Weight change:   Intake/Output Summary (Last 24 hours) at 11/22/11 1543 Last data filed at 11/22/11 1410  Gross per 24 hour  Intake    720 ml  Output    200 ml  Net    520 ml   Physical Exam: BP 103/57  Pulse 91  Temp(Src) 98.1 F (36.7 C) (Oral)  Resp 18  Ht 6' (1.829 m)  Wt 233 lb 0.4 oz (105.7 kg)  BMI 31.60 kg/m2  SpO2 94%  General Appearance:    Alert, cooperative, no distress, appears stated age  Head:    Normocephalic, without obvious abnormality, atraumatic  Eyes:    PERRL, conjunctiva/corneas clear, EOM's intact, fundi    benign, both eyes       Ears:    Normal TM's and external ear canals, both ears  Nose:   Nares normal, septum midline, mucosa normal, no drainage    or sinus tenderness  Throat:   Lips, mucosa, and tongue normal; teeth and gums normal  Neck:   Supple, symmetrical, trachea midline, no adenopathy;       thyroid: massive lymphadenopathy bilaterally, no carotid   bruit or JVD  Back:     Symmetric, no curvature, ROM normal, no CVA tenderness  Lungs:     Clear to auscultation bilaterally, respirations unlabored  Chest wall:    No tenderness or deformity  Heart:    Regular rate and rhythm, S1 and S2 normal, no murmur, rub   or gallop  Abdomen:     Soft, non-tender, bowel sounds active all four quadrants,    no masses, no organomegaly  Genitalia:    Normal male without lesion, discharge or tenderness     Extremities:   Extremities normal, atraumatic, no cyanosis or edema  Pulses:    2+ and symmetric all extremities  Skin:   Skin color, texture, turgor normal, no rashes or lesions  Lymph nodes:   Cervical, supraclavicular, and axillary enlarged but non   tender  Neurologic:   CNII-XII intact. Normal strength, sensation and reflexes      throughout   Lab Results: Basic Metabolic Panel:  Lab 11/22/11 1610 11/21/11 1748  NA 135 130*  K 4.7 4.8  CL 105 99  CO2 22 20  GLUCOSE 192* 165*  BUN 27* 26*  CREATININE 1.65* 1.66*  CALCIUM 9.1 9.4  MG -- --  PHOS -- --   Liver Function Tests:  Lab 11/22/11 0610  AST 8  ALT 7  ALKPHOS 50  BILITOT 0.2*  PROT 7.5  ALBUMIN 2.5*   No results found for this basename: LIPASE:2,AMYLASE:2 in the last 168 hours No results found for this basename: AMMONIA:2 in the last 168 hours CBC:  Lab 11/22/11 0610 11/21/11 1748  WBC 3.5* 4.5  NEUTROABS -- 2.1  HGB 7.9* 8.5*  HCT 24.2* 26.1*  MCV 89.3 87.9  PLT 121* 121*   Cardiac  Enzymes: No results found for this basename: CKTOTAL:3,CKMB:3,CKMBINDEX:3,TROPONINI:3 in the last 168 hours BNP: No results found for this basename: PROBNP:3 in the last 168 hours D-Dimer: No results found for this basename: DDIMER:2 in the last 168 hours CBG: No results found for this basename: GLUCAP:6 in the last 168 hours Hemoglobin A1C: No results found for this basename: HGBA1C in the last 168 hours Fasting Lipid Panel: No results found for this basename: CHOL,HDL,LDLCALC,TRIG,CHOLHDL,LDLDIRECT in the last 409 hours Thyroid Function Tests: No results found for this basename: TSH,T4TOTAL,FREET4,T3FREE,THYROIDAB in the last 168 hours Coagulation: No results found for this basename: LABPROT:4,INR:4 in the last 168 hours Anemia Panel: No results found for this basename: VITAMINB12,FOLATE,FERRITIN,TIBC,IRON,RETICCTPCT in the last 168 hours Urine Drug Screen: Drugs of Abuse     Component Value Date/Time   LABOPIA NONE DETECTED 11/19/2010 0348   COCAINSCRNUR NONE DETECTED 11/19/2010 0348    LABBENZ NONE DETECTED 11/19/2010 0348   AMPHETMU NONE DETECTED 11/19/2010 0348   THCU NONE DETECTED 11/19/2010 0348   LABBARB  Value: NONE DETECTED        DRUG SCREEN FOR MEDICAL PURPOSES ONLY.  IF CONFIRMATION IS NEEDED FOR ANY PURPOSE, NOTIFY LAB WITHIN 5 DAYS.        LOWEST DETECTABLE LIMITS FOR URINE DRUG SCREEN Drug Class       Cutoff (ng/mL) Amphetamine      1000 Barbiturate      200 Benzodiazepine   200 Tricyclics       300 Opiates          300 Cocaine          300 THC              50 11/19/2010 0348    Alcohol Level: No results found for this basename: ETH:2 in the last 168 hours Urinalysis:  Lab 11/21/11 2246  COLORURINE YELLOW  LABSPEC 1.027  PHURINE 5.5  GLUCOSEU NEGATIVE  HGBUR TRACE*  BILIRUBINUR SMALL*  KETONESUR 15*  PROTEINUR 100*  UROBILINOGEN 0.2  NITRITE NEGATIVE  LEUKOCYTESUR NEGATIVE     Micro Results: Recent Results (from the past 240 hour(s))  CULTURE, BLOOD (SINGLE)     Status: Normal (Preliminary result)   Collection Time   11/17/11  9:49 AM      Component Value Range Status Comment   Preliminary Report Blood Culture received; No Growth to date;   Preliminary    Preliminary Report Culture will be held for 5 days before issuing   Preliminary    Preliminary Report a Final Negative report.   Preliminary   CULTURE, BLOOD (SINGLE)     Status: Normal (Preliminary result)   Collection Time   11/17/11  9:52 AM      Component Value Range Status Comment   Preliminary Report Blood Culture received; No Growth to date;   Preliminary    Preliminary Report Culture will be held for 5 days before issuing   Preliminary    Preliminary Report a Final Negative report.   Preliminary   MRSA PCR SCREENING     Status: Normal   Collection Time   11/21/11 11:07 PM      Component Value Range Status Comment   MRSA by PCR NEGATIVE  NEGATIVE  Final    Studies/Results: Dg Chest 2 View  11/21/2011  *RADIOLOGY REPORT*  Clinical Data: Fever.  Nausea.  Lymphadenopathy in the neck,  groin, and axilla.  CHEST - 2 VIEW 11/21/2011:  Comparison: Two-view chest x-ray 11/17/2011, 10/02/2011, 12/25/2010, 11/28/2009 [facility].  Findings: Cardiac silhouette upper normal in size, unchanged. Hilar and mediastinal contours otherwise unremarkable.  Lungs clear.  Bronchovascular markings normal.  Pulmonary vascularity normal.  No pneumothorax.  No pleural effusions.  Mild eventration of the right anterior hemidiaphragm, unchanged.  Visualized bony thorax intact.  IMPRESSION: No acute cardiopulmonary disease.  Stable examination.  Original Report Authenticated By: Arnell Sieving, M.D.   Medications: I have reviewed the patient's current medications. Scheduled Meds:    . amLODipine  10 mg Oral Daily  . chlorpheniramine-HYDROcodone  5 mL Oral Q12H  . dapsone  100 mg Oral Daily  . efavirenz-emtrictabine-tenofovir  1 tablet Oral QHS  . enoxaparin  40 mg Subcutaneous Daily  .  HYDROmorphone (DILAUDID) injection  1 mg Intravenous Once  . ketorolac  30 mg Intravenous Once  . labetalol  200 mg Oral BID  . ondansetron  4 mg Intravenous Once  . sodium chloride  1,000 mL Intravenous Once  . sodium chloride  3 mL Intravenous Q12H   Continuous Infusions:    . sodium chloride 125 mL/hr at 11/22/11 1049   PRN Meds:.acetaminophen, acetaminophen, HYDROcodone-acetaminophen, ondansetron (ZOFRAN) IV, ondansetron, sodium chloride Assessment/Plan:  1. Lymphadenopathy with fevers:  2. Acute on chronic renal insufficiency 3. Sinusitis 4. Anemia  Given his chronic lymphadenopathy which has been worked up in the past, we will get the ID involved and see what additional tests would be warranted at this time. Patient will be started on tussinex as this will help him with the cough. We started him on IV fluids to help with renal insufficiency and it seems prerenal at this time. FeNa was 0.07. Follow up on anemia panel tomorrow.  Lars Mage MD R3 Internal Medicine Resident Pager  [phone]    LOS: 1 day   [facility] 11/22/2011, 3:43 PM  Internal Medicine Teaching Service Attending Note Date: 11/23/2011  Patient name: Patrick [name]  Medical record number: [id number]  Date of birth: 1960-12-17    This patient has been seen and discussed with the house staff. Please see their note for complete details. I concur with their findings with the following additions/corrections: Mr. Roggenkamp is a 51yo Male with HIV, CD 4 count 220(14%).VL undectectable on Atripla who presents with malaise, fatigue, fevers and 13 lb weight gain due to abdominal girth in last 4 wks. He is also noticing increase in his baseline lymphadenopathy. He underwent abd/pelvis CT and excisional biopsy in 2/12 without finding a diagnosis.  We will ask general surgery to evaluate the patient for repeat excisional biospy so that specimen can be sent for path/cytology as well as culture, looking for the following etiologies that can be seen in a HIV infected patient with LAD:   - lymphoma (burkitt's v. Diffuse large b cell vs. hodgkins vs. 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 Unclear if it is related to his clinical presentation of fever and lymphadenopathy. Will continue to monitor for now.  Wildon Cuevas 11/23/2011, 1:19 PM

## 2011-11-22 NOTE — H&P (Signed)
Internal Medicine Attending Admission Note Date: 11/22/2011  Patient name: Patrick [name] Medical record number: [id number] Date of birth: 09-Jul-1961 Age: 51 y.o. Gender: male  I saw and evaluated the patient. I reviewed the resident's note and I agree with the resident's findings and plan as documented in the resident's note.  Chief Complaint(s):  Fever, sinus tenderness, enlarged lymph nodes  History - key components related to admission:  Patrick [name] is a 51 year old man with a history of HIV, CD4 count 240, viral load undetectable, who presents with a several week history of nocturnal fevers to 102.7 and daytime sweats with concomitant exacerbation of his lymphadenopathy. He was in his usual state of health until late January when he developed an upper respiratory infection that included maxillary sinus tenderness, green sinus drainage, and a cough. This did not improve with time and he was placed on an azithromycin course. The maxillary tenderness has markedly improved as has the green drainage which has resolved. He still has a sensation of a postnasal drip and an occasional hacking cough. His lymphadenopathy has been present for greater than a year, although has been waxing and waning. An excisional biopsy in the past was negative for MAC or lymphoma. Nonetheless he feels his lymphadenopathy has worsened in the last several weeks.  He denies sore throat, chest pain, hemoptysis, abdominal pain, nausea, vomiting, diarrhea, hematuria, dysuria, or rashes. He has had a persistent maxillary sinus headache. He states he's been compliant with his medications including his antiretroviral therapy. He is nervous about his lymphadenopathy and presents for further evaluation.  Physical Exam - key components related to admission:  Filed Vitals:   11/21/11 2333 11/22/11 0612 11/22/11 0613 11/22/11 0614  BP: 120/62 109/51 109/52 110/54  Pulse: 98 79 80 80  Temp:  97.5 F (36.4 C) 97.5 F (36.4 C) 97.5  F (36.4 C)  TempSrc:  Oral Oral Oral  Resp:  20 20 20   Height:      Weight:  233 lb 0.4 oz (105.7 kg)    SpO2:  93% 93% 93%   Gen.: Well-developed, well-nourished, man lying comfortably in bed in no acute distress. Lymph system: Adenopathy in the cervical, supraclavicular, axillary, and groin areas. The lymph nodes are soft, non tender, and mobile. Lungs: Clear to auscultation without wheezes, rhonchi, or rales. Heart: Regular rate and rhythm without murmurs, rubs, or gallops. Abdomen: Soft, non tender, active bowel sounds, liver edge was sharp and palpated approximately 3 cm below the costophrenic margin. I felt I could also palpate the spleen tip.  Extremities: Without edema.  Lab results:  Basic Metabolic Panel:  Basename 11/22/11 0610 11/21/11 1748  NA 135 130*  K 4.7 4.8  CL 105 99  CO2 22 20  GLUCOSE 192* 165*  BUN 27* 26*  CREATININE 1.65* 1.66*  CALCIUM 9.1 9.4  MG -- --  PHOS -- --   Liver Function Tests:  [facility] 11/22/11 0610  AST 8  ALT 7  ALKPHOS 50  BILITOT 0.2*  PROT 7.5  ALBUMIN 2.5*   CBC:  Basename 11/22/11 0610 11/21/11 1748  WBC 3.5* 4.5  NEUTROABS -- 2.1  HGB 7.9* 8.5*  HCT 24.2* 26.1*  MCV 89.3 87.9  PLT 121* 121*   Urine Drug Screen:  Negative  Imaging results:  Dg Chest 2 View  11/21/2011  *RADIOLOGY REPORT*  Clinical Data: Fever.  Nausea.  Lymphadenopathy in the neck, groin, and axilla.  CHEST - 2 VIEW 11/21/2011:  Comparison: Two-view chest x-ray 11/17/2011, 10/02/2011,  12/25/2010, 11/28/2009 [facility].  Findings: Cardiac silhouette upper normal in size, unchanged. Hilar and mediastinal contours otherwise unremarkable.  Lungs clear.  Bronchovascular markings normal.  Pulmonary vascularity normal.  No pneumothorax.  No pleural effusions.  Mild eventration of the right anterior hemidiaphragm, unchanged.  Visualized bony thorax intact.  IMPRESSION: No acute cardiopulmonary disease.  Stable examination.  Original Report  Authenticated By: Arnell Sieving, M.D.   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  It sounds like he may have initially had a viral upper respiratory tract infection resulting in a viral sinusitis. This may have become superinfected but has apparently responded to either time or the antibiotic course he received. Around this time he states that his lymphadenopathy has worsened and he developed nocturnal fevers. Given the waning and waxing course of the lymphadenopathy over time the working diagnosis remains IRIS. This may be driven by the recent viral infection. Given his recent CD4 count it is unlikely to represent MAC especially with an unremarkable alkaline phosphatase. Lymphoma can occur at any time in a patient with HIV although the waxing and waning course would seem inconsistent with this diagnosis.  1) Sinusitis: He has completed his azithromycin therapy as of yesterday. We will start Tussinex which has chlorpheniramine in hopes of using its anticholinergic effects to dry up his postnasal drip and resolved his cough. It may also allow for further drainage of his sinuses if the swelling can be decreased.  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 We will continue to reassure him of this working diagnosis. If this persists, or he insists, an excisional biopsy of one of the lymph nodes may be prudent, if anything for his peace of mind.  3) Acute on chronic renal insufficiency: Likely secondary to a prerenal azotemia from his fevers and sweats. We will hydrate with normal saline and follow his creatinine. If it returns to baseline with this therapy no further evaluation will be necessary.  4) Anemia: His hematocrit seems to be lower acutely. The cause of this is unclear. LFTs with a normal bilirubin do not support a diagnosis of hemolysis. I agree  with the housestaff's plan to assess with an anemia panel to make sure he has no deficiencies and to check fecal occult blood test cards.  Further evaluation pending these results.

## 2011-11-23 DIAGNOSIS — R599 Enlarged lymph nodes, unspecified: Secondary | ICD-10-CM

## 2011-11-23 DIAGNOSIS — B20 Human immunodeficiency virus [HIV] disease: Secondary | ICD-10-CM

## 2011-11-23 LAB — T-HELPER CELLS (CD4) COUNT (NOT AT ARMC)
CD4 % Helper T Cell: 14 % — ABNORMAL LOW (ref 33–55)
CD4 T Cell Abs: 110 uL — ABNORMAL LOW (ref 400–2700)

## 2011-11-23 LAB — CULTURE, BLOOD (SINGLE): Organism ID, Bacteria: NO GROWTH

## 2011-11-23 LAB — BASIC METABOLIC PANEL
CO2: 23 mEq/L (ref 19–32)
Calcium: 9 mg/dL (ref 8.4–10.5)
Creatinine, Ser: 1.47 mg/dL — ABNORMAL HIGH (ref 0.50–1.35)
GFR calc Af Amer: 63 mL/min — ABNORMAL LOW (ref >90)
Sodium: 136 mEq/L (ref 135–145)

## 2011-11-23 NOTE — Progress Notes (Signed)
Inpatient Diabetes Program Recommendations  AACE/ADA: New Consensus Statement on Inpatient Glycemic Control (2009)  Target Ranges:  Prepandial:   less than 140 mg/dL      Peak postprandial:   less than 180 mg/dL (1-2 hours)      Critically ill patients:  140 - 180 mg/dL   Results for TAMARI, BUSIC (MRN [id number]) as of 11/23/2011 09:47  Ref. Range 11/22/2011 06:10  Glucose Latest Range: 70-99 mg/dL 297 (H)   Results for ZVI, DUPLANTIS (MRN [id number]) as of 11/23/2011 09:47  Ref. Range 11/23/2011 04:55  Glucose Latest Range: 70-99 mg/dL 740 (H)    Inpatient Diabetes Program Recommendations Correction (SSI): May want to check CBGs and cover with Novolog Sensitive SSI if elevated.  Note: Will follow. Ambrose Finland RN, MSN, CDE Diabetes Coordinator Inpatient Diabetes Program [phone]

## 2011-11-23 NOTE — Consult Note (Signed)
Reason for Consult:Lymph node biopsy Consulting Surgeon: Gerrit Friends Referring Physician: Luan Moore   HPI: Patrick [name] is an 51 y.o. male with HIV who we saw last year and performed LN biopsy. This was a benign biopsy. He actually also has a known ventral hernia and was scheduled to see one of our surgeons to have elective repair but developed acute febrile illness again and had to be admitted.  He denies any problems from his hernia. He has continued to have problems with intermittent lymphadenopathy since last year. He says they come and go. He has been seen again by ID who have sent cultures but have requested repeat excisional LN biopsy to aid in their diagnosis.  Past Medical History:  Past Medical History  Diagnosis Date  . Hypertension   . Renal insufficiency     hydrated cr 1.5 gfr 48  . HIV (human immunodeficiency virus infection)   . Hypogonadism male     Surgical History:  Past Surgical History  Procedure Date  . Lithotripsy 2010  . Appendectomy 2001    via midline incision  . Lymph node biopsy 2010, 2011    bilateral... by dr Derrell Lolling    Family History:  Family History  Problem Relation Age of Onset  . Adopted: Yes    Social History:  reports that he has never smoked. He has never used smokeless tobacco. He reports that he drinks about .5 ounces of alcohol per week. He reports that he does not use illicit drugs.  Allergies:  Allergies  Allergen Reactions  . Codeine Itching, Rash and Other (See Comments)    "crazy dreams"  . Sulfonamide Derivatives Rash    Medications:  Prior to Admission:  Prescriptions prior to admission  Medication Sig Dispense Refill  . amLODipine (NORVASC) 10 MG tablet Take 10 mg by mouth daily.      Marland Kitchen azithromycin (ZITHROMAX) 250 MG tablet Take 250-500 mg by mouth daily. Take 2 tablets (500 mg) on  Day 1,  followed by 1 tablet (250 mg) once daily on Days 2 through 5. Today was the last dose      . dapsone 100 MG tablet Take 100 mg by  mouth daily.      Marland Kitchen efavirenz-emtrictabine-tenofovir (ATRIPLA) 600-200-300 MG per tablet Take 1 tablet by mouth at bedtime.      Marland Kitchen labetalol (NORMODYNE) 200 MG tablet Take 200 mg by mouth 2 (two) times daily.        ROS: See HPI for pertinent findings, otherwise complete 10 system review negative.  Physical Exam: Blood pressure 133/63, pulse 93, temperature 98.5 F (36.9 C), temperature source Oral, resp. rate 18, height 6' (1.829 m), weight 105.7 kg (233 lb 0.4 oz), SpO2 98.00%.  General Appearance:  Alert, cooperative, no distress, appears stated age.  Head:  Normocephalic, without obvious abnormality, atraumatic  ENT: Unremarkable  Neck: Supple, symmetrical, trachea midline, large soft, mildly tender cervical lymphadenopathy. Palpable mobile NT (L)supraclavicular node.  Lungs:   Clear to auscultation bilaterally, no w/r/r, respirations unlabored without use of accessory muscles.  Chest Wall:  No tenderness or deformity. (B)axillary LAD, mildly tender, no erythema.  Heart:  Regular rate and rhythm, S1, S2 normal, no murmur, rub or gallop. Carotids 2+ without bruit.  Abdomen:   Soft, non-tender, non distended. Bowel sounds active all four quadrants,  no masses, no organomegaly. Palpable NT inguinal LAD (B)  Genitalia:  Normal. No hernias  Rectal:  Deferred.  Extremities: Extremities normal, atraumatic, no cyanosis or edema  Pulses:  2+ and symmetric  Skin: Skin color, texture, turgor normal, no rashes or lesions  Neurologic: Normal affect, no gross deficits.     Labs: CBC  Basename 11/22/11 0610 11/21/11 1748  WBC 3.5* 4.5  HGB 7.9* 8.5*  HCT 24.2* 26.1*  PLT 121* 121*   MET  Basename 11/23/11 0455 11/22/11 0610  NA 136 135  K 5.0 4.7  CL 107 105  CO2 23 22  GLUCOSE 194* 192*  BUN 20 27*  CREATININE 1.47* 1.65*  CALCIUM 9.0 9.1    Basename 11/22/11 0610  PROT 7.5  ALBUMIN 2.5*  AST 8  ALT 7  ALKPHOS 50  BILITOT 0.2*  BILIDIR <0.1  IBILI NOT CALCULATED    LIPASE --   PT/INR No results found for this basename: LABPROT:2,INR:2 in the last 72 hours ABG No results found for this basename: PHART:2,PCO2:2,PO2:2,HCO3:2 in the last 72 hours    Dg Chest 2 View  11/21/2011  *RADIOLOGY REPORT*  Clinical Data: Fever.  Nausea.  Lymphadenopathy in the neck, groin, and axilla.  CHEST - 2 VIEW 11/21/2011:  Comparison: Two-view chest x-ray 11/17/2011, 10/02/2011, 12/25/2010, 11/28/2009 [facility].  Findings: Cardiac silhouette upper normal in size, unchanged. Hilar and mediastinal contours otherwise unremarkable.  Lungs clear.  Bronchovascular markings normal.  Pulmonary vascularity normal.  No pneumothorax.  No pleural effusions.  Mild eventration of the right anterior hemidiaphragm, unchanged.  Visualized bony thorax intact.  IMPRESSION: No acute cardiopulmonary disease.  Stable examination.  Original Report Authenticated By: Arnell Sieving, M.D.    Assessment/Plan: Principal Problem:  *LYMPHADENOPATHY, DIFFUSE Active Problems:  HIV DISEASE  Will make NPO after MN for poss LN BX MD to see and decide which location. D/W patient and he understands as he had this last year.    Marianna Fuss PA-C 11/23/2011, 1:08 PM

## 2011-11-23 NOTE — Consult Note (Signed)
[facility] Attending:  Patient seen and examined.  Chart reviewed.  Will plan left axillary excisional biopsy of lymph node for 2/26.  Will need guidance from medical and ID services regarding desired testing.  The risks and benefits of the procedure have been discussed at length with the patient.  The patient understands the proposed procedure, potential alternative treatments, and the course of recovery to be expected.  All of the patient's questions have been answered at this time.  The patient wishes to proceed with surgery.  Velora Heckler, MD, FACS General & Endocrine Surgery [facility]

## 2011-11-23 NOTE — Progress Notes (Signed)
Subjective: Feeling good after shower this a.m.  No c/o.  Objective: Vital signs in last 24 hours: Filed Vitals:   11/22/11 1409 11/22/11 2123 11/22/11 2201 11/23/11 0626  BP: 103/57 109/45 109/45 133/63  Pulse: 91 83 83 93  Temp: 98.1 F (36.7 C) 98.5 F (36.9 C)  98.5 F (36.9 C)  TempSrc: Oral Oral  Oral  Resp: 18 18  18   Height:      Weight:    233 lb 0.4 oz (105.7 kg)  SpO2: 94% 90%  98%   Weight change: 0 lb (0 kg)  Intake/Output Summary (Last 24 hours) at 11/23/11 1221 Last data filed at 11/23/11 0600  Gross per 24 hour  Intake 2719.92 ml  Output    300 ml  Net 2419.92 ml   Physical Exam: Filed Vitals:   11/22/11 1409 11/22/11 2123 11/22/11 2201 11/23/11 0626  BP: 103/57 109/45 109/45 133/63  Pulse: 91 83 83 93  Temp: 98.1 F (36.7 C) 98.5 F (36.9 C)  98.5 F (36.9 C)  TempSrc: Oral Oral  Oral  Resp: 18 18  18   Height:      Weight:    233 lb 0.4 oz (105.7 kg)  SpO2: 94% 90%  98%   GEN: NAD.  Alert and oriented x 3.  Pleasant, conversant, and cooperative to exam. RESP:  CTAB, no w/r/r CARDIOVASCULAR: RRR, S1, S2, no m/r/g ABDOMEN: soft, NT/ND, NABS EXT: warm and dry. No edema in b/l LE.  Extensive LAD of b/l neck, axilla, and groin, mildly tender to palpation  Lab Results: Basic Metabolic Panel:  Lab 11/23/11 1610 11/22/11 0610  NA 136 135  K 5.0 4.7  CL 107 105  CO2 23 22  GLUCOSE 194* 192*  BUN 20 27*  CREATININE 1.47* 1.65*  CALCIUM 9.0 9.1  MG -- --  PHOS -- --   Liver Function Tests:  Lab 11/22/11 0610  AST 8  ALT 7  ALKPHOS 50  BILITOT 0.2*  PROT 7.5  ALBUMIN 2.5*   CBC:  Lab 11/22/11 0610 11/21/11 1748  WBC 3.5* 4.5  NEUTROABS -- 2.1  HGB 7.9* 8.5*  HCT 24.2* 26.1*  MCV 89.3 87.9  PLT 121* 121*   Urinalysis:  Lab 11/21/11 2246  COLORURINE YELLOW  LABSPEC 1.027  PHURINE 5.5  GLUCOSEU NEGATIVE  HGBUR TRACE*  BILIRUBINUR SMALL*  KETONESUR 15*  PROTEINUR 100*  UROBILINOGEN 0.2  NITRITE NEGATIVE  LEUKOCYTESUR  NEGATIVE   Micro Results: Recent Results (from the past 240 hour(s))  CULTURE, BLOOD (SINGLE)     Status: Normal   Collection Time   11/17/11  9:49 AM      Component Value Range Status Comment   Organism ID, Bacteria NO GROWTH 5 DAYS   Final   CULTURE, BLOOD (SINGLE)     Status: Normal   Collection Time   11/17/11  9:52 AM      Component Value Range Status Comment   Organism ID, Bacteria NO GROWTH 5 DAYS   Final   CULTURE, BLOOD (ROUTINE X 2)     Status: Normal (Preliminary result)   Collection Time   11/21/11  5:50 PM      Component Value Range Status Comment   Specimen Description BLOOD LEFT ARM   Final    Special Requests     Final    Value: BOTTLES DRAWN AEROBIC AND ANAEROBIC 10CC BLUE 5CC RED   Culture  Setup Time [id number]   Final    Culture  Final    Value:        BLOOD CULTURE RECEIVED NO GROWTH TO DATE CULTURE WILL BE HELD FOR 5 DAYS BEFORE ISSUING A FINAL NEGATIVE REPORT   Report Status PENDING   Incomplete   CULTURE, BLOOD (ROUTINE X 2)     Status: Normal (Preliminary result)   Collection Time   11/21/11  5:59 PM      Component Value Range Status Comment   Specimen Description BLOOD RIGHT HAND   Final    Special Requests BOTTLES DRAWN AEROBIC AND ANAEROBIC 10CC   Final    Culture  Setup Time [id number]   Final    Culture     Final    Value:        BLOOD CULTURE RECEIVED NO GROWTH TO DATE CULTURE WILL BE HELD FOR 5 DAYS BEFORE ISSUING A FINAL NEGATIVE REPORT   Report Status PENDING   Incomplete   MRSA PCR SCREENING     Status: Normal   Collection Time   11/21/11 11:07 PM      Component Value Range Status Comment   MRSA by PCR NEGATIVE  NEGATIVE  Final   FUNGUS CULTURE, BLOOD     Status: Normal (Preliminary result)   Collection Time   11/22/11  3:00 PM      Component Value Range Status Comment   Specimen Description BLOOD LEFT ARM   Final    Special Requests BOTTLES DRAWN AEROBIC AND ANAEROBIC 10CC   Final    Culture NO FUNGUS ISOLATED;CULTURE IN PROGRESS FOR  7 DAYS   Final    Report Status PENDING   Incomplete   CULTURE, BLOOD (ROUTINE X 2)     Status: Normal (Preliminary result)   Collection Time   11/22/11  3:06 PM      Component Value Range Status Comment   Specimen Description BLOOD LEFT ARM   Final    Special Requests BOTTLES DRAWN AEROBIC AND ANAEROBIC 10CC   Final    Culture  Setup Time [id number]   Final    Culture     Final    Value:        BLOOD CULTURE RECEIVED NO GROWTH TO DATE CULTURE WILL BE HELD FOR 5 DAYS BEFORE ISSUING A FINAL NEGATIVE REPORT   Report Status PENDING   Incomplete   AFB CULTURE, BLOOD     Status: Normal (Preliminary result)   Collection Time   11/22/11  4:29 PM      Component Value Range Status Comment   Specimen Description BLOOD LEFT HAND   Final    Special Requests BOTTLES DRAWN AEROBIC ONLY 5CC   Final    Culture     Final    Value: CULTURE WILL BE EXAMINED FOR 6 WEEKS BEFORE ISSUING A FINAL REPORT   Report Status PENDING   Incomplete   CULTURE, BLOOD (ROUTINE X 2)     Status: Normal (Preliminary result)   Collection Time   11/22/11  4:29 PM      Component Value Range Status Comment   Specimen Description BLOOD LEFT HAND   Final    Special Requests     Final    Value: BOTTLES DRAWN AEROBIC AND ANAEROBIC 10CC AER, 5CC ANA   Culture  Setup Time [id number]   Final    Culture     Final    Value:        BLOOD CULTURE RECEIVED NO GROWTH TO DATE CULTURE WILL BE HELD FOR 5 DAYS BEFORE  ISSUING A FINAL NEGATIVE REPORT   Report Status PENDING   Incomplete   FUNGUS CULTURE, BLOOD     Status: Normal (Preliminary result)   Collection Time   11/22/11  4:29 PM      Component Value Range Status Comment   Specimen Description BLOOD LEFT HAND   Final    Special Requests BOTTLES DRAWN AEROBIC AND ANAEROBIC 10CCAER 5CCANA   Final    Culture NO FUNGUS ISOLATED;CULTURE IN PROGRESS FOR 7 DAYS   Final    Report Status PENDING   Incomplete    Studies/Results: Dg Chest 2 View  11/21/2011  *RADIOLOGY REPORT*  Clinical  Data: Fever.  Nausea.  Lymphadenopathy in the neck, groin, and axilla.  CHEST - 2 VIEW 11/21/2011:  Comparison: Two-view chest x-ray 11/17/2011, 10/02/2011, 12/25/2010, 11/28/2009 [facility].  Findings: Cardiac silhouette upper normal in size, unchanged. Hilar and mediastinal contours otherwise unremarkable.  Lungs clear.  Bronchovascular markings normal.  Pulmonary vascularity normal.  No pneumothorax.  No pleural effusions.  Mild eventration of the right anterior hemidiaphragm, unchanged.  Visualized bony thorax intact.  IMPRESSION: No acute cardiopulmonary disease.  Stable examination.  Original Report Authenticated By: Arnell Sieving, M.D.   Medications: I have reviewed the patient's current medications. Scheduled Meds:    . amLODipine  10 mg Oral Daily  . chlorpheniramine-HYDROcodone  5 mL Oral Q12H  . chlorpheniramine-HYDROcodone  5 mL Oral Once  . dapsone  100 mg Oral Daily  . efavirenz-emtrictabine-tenofovir  1 tablet Oral QHS  . enoxaparin  40 mg Subcutaneous Daily  . labetalol  200 mg Oral BID  . sodium chloride  3 mL Intravenous Q12H   Continuous Infusions:    . sodium chloride 125 mL/hr at 11/22/11 1556   PRN Meds:.acetaminophen, acetaminophen, HYDROcodone-acetaminophen, ondansetron (ZOFRAN) IV, ondansetron, sodium chloride  Assessment/Plan: # Fever and LAD Pt has waxing/waning LAD and intermittent fevers with neg LN bx one year ago.  Given con't fevers and LAD, pt admitted for further workup.  Ddx includes lymphoma, mycobacterial or fungal infxn, less likely IRIS (especially given worsening CD4).  CD4 of 110 (just came back) increases risk of OI.  After discussion with primary team today, have decided to pursue repeat bx despite negative result last year in the setting of HIV, worsening fatigue, fevers.  Other infectious w/u is in process including multiple cultures and quantiferon. - consult to gensurg for LN biopsy - consider bone marrow biopsy after LN results  return - f/u blood cx - f/u fungal cx - f/u AFB cx - f/u CD4/viral load - f/u quantiferon  # Acute anemia, normocytic Hb down to 7.9 yesterday, no suggestion of GI loss, LFT's do not indicate hemolysis. - Repeat CBC in the am - anemia panel in a.m. - Check FOBT   # HIV CD4 returned at 110 today, down from prior.  Starting dapsone for ppx, will consider changing HIV regimen - dapsone 100mg  qd - consider changes to HIV regimen  # AKI on CKD Improving with gentle fluids. - Repeat BMET in am  - Check UNa and UCr    # DVT prophylaxis - lovenox   LOS: 2 days   WILDMAN-TOBRINER, BEN 11/23/2011, 12:21 PM

## 2011-11-24 ENCOUNTER — Encounter (HOSPITAL_COMMUNITY): Payer: Self-pay | Admitting: Anesthesiology

## 2011-11-24 ENCOUNTER — Ambulatory Visit: Payer: Medicaid Other | Admitting: Internal Medicine

## 2011-11-24 ENCOUNTER — Encounter (HOSPITAL_COMMUNITY)
Admission: EM | Disposition: A | Discharge status: Home / Self Care | Payer: Self-pay | Source: Home / Self Care | Attending: Internal Medicine

## 2011-11-24 ENCOUNTER — Inpatient Hospital Stay (HOSPITAL_COMMUNITY): Payer: Medicaid Other | Admitting: Anesthesiology

## 2011-11-24 LAB — CBC
MCH: 28.9 pg (ref 26.0–34.0)
MCV: 90.7 fL (ref 78.0–100.0)
Platelets: 151 10*3/uL (ref 150–400)
RDW: 17.1 % — ABNORMAL HIGH (ref 11.5–15.5)
WBC: 3.5 10*3/uL — ABNORMAL LOW (ref 4.0–10.5)

## 2011-11-24 LAB — BASIC METABOLIC PANEL
Calcium: 9.2 mg/dL (ref 8.4–10.5)
GFR calc non Af Amer: 56 mL/min — ABNORMAL LOW (ref >90)
Sodium: 137 mEq/L (ref 135–145)

## 2011-11-24 LAB — FERRITIN: Ferritin: 395 ng/mL — ABNORMAL HIGH (ref 22–322)

## 2011-11-24 LAB — GLUCOSE, CAPILLARY: Glucose-Capillary: 153 mg/dL — ABNORMAL HIGH (ref 70–99)

## 2011-11-24 LAB — IRON AND TIBC: Iron: <10 ug/dL — ABNORMAL LOW (ref 42–135)

## 2011-11-24 LAB — RETICULOCYTES
Retic Count, Absolute: 21.6 10*3/uL (ref 19.0–186.0)
Retic Ct Pct: 0.8 % (ref 0.4–3.1)

## 2011-11-24 LAB — VITAMIN B12: Vitamin B-12: 131 pg/mL — ABNORMAL LOW (ref 211–911)

## 2011-11-24 LAB — HIV-1 RNA ULTRAQUANT REFLEX TO GENTYP+: HIV 1 RNA Quant: <20 copies/mL (ref <20)

## 2011-11-24 SURGERY — AXILLARY LYMPH NODE BIOPSY
Anesthesia: General | Site: Axilla | Laterality: Left | Wound class: Clean

## 2011-11-24 MED ORDER — LACTATED RINGERS IV SOLN
INTRAVENOUS | Status: DC | PRN
  Administered 2011-11-24: 13:00:00 via INTRAVENOUS

## 2011-11-24 MED ORDER — MIDAZOLAM HCL 5 MG/5ML IJ SOLN
INTRAMUSCULAR | Status: DC | PRN
  Administered 2011-11-24: 2 mg via INTRAVENOUS

## 2011-11-24 MED ORDER — 0.9 % SODIUM CHLORIDE (POUR BTL) OPTIME
TOPICAL | Status: DC | PRN
  Administered 2011-11-24: 1000 mL

## 2011-11-24 MED ORDER — CEFAZOLIN SODIUM 1-5 GM-% IV SOLN
INTRAVENOUS | Status: AC
  Filled 2011-11-24: qty 100

## 2011-11-24 MED ORDER — CEFAZOLIN SODIUM 1-5 GM-% IV SOLN
INTRAVENOUS | Status: DC | PRN
  Administered 2011-11-24: 2 g via INTRAVENOUS

## 2011-11-24 MED ORDER — ONDANSETRON HCL 4 MG/2ML IJ SOLN: 4.0000 mg | Freq: Once | INTRAMUSCULAR | Status: DC | PRN

## 2011-11-24 MED ORDER — PROMETHAZINE HCL 25 MG/ML IJ SOLN: 12.5000 mg | Freq: Four times a day (QID) | INTRAMUSCULAR | Status: DC | PRN

## 2011-11-24 MED ORDER — ACETAMINOPHEN 325 MG PO TABS: 650.0000 mg | ORAL_TABLET | ORAL | Status: DC | PRN

## 2011-11-24 MED ORDER — BUPIVACAINE HCL 0.5 % IJ SOLN
INTRAMUSCULAR | Status: DC | PRN
  Administered 2011-11-24: 10 mL

## 2011-11-24 MED ORDER — LACTATED RINGERS IV SOLN
INTRAVENOUS | Status: DC
  Administered 2011-11-24: 12:00:00 via INTRAVENOUS

## 2011-11-24 MED ORDER — HYDROCODONE-ACETAMINOPHEN 5-325 MG PO TABS
1.0000 | ORAL_TABLET | ORAL | Status: DC | PRN
  Administered 2011-11-24: 1 via ORAL
  Filled 2011-11-24: qty 1

## 2011-11-24 MED ORDER — KCL IN DEXTROSE-NACL 20-5-0.45 MEQ/L-%-% IV SOLN
INTRAVENOUS | Status: DC
  Administered 2011-11-24 – 2011-11-26 (×3): via INTRAVENOUS
  Filled 2011-11-24 (×6): qty 1000

## 2011-11-24 MED ORDER — HYDROMORPHONE HCL PF 1 MG/ML IJ SOLN: 1.0000 mg | INTRAMUSCULAR | Status: DC | PRN

## 2011-11-24 MED ORDER — PROPOFOL 10 MG/ML IV BOLUS
INTRAVENOUS | Status: DC | PRN
  Administered 2011-11-24: 300 mg via INTRAVENOUS

## 2011-11-24 MED ORDER — HYDROMORPHONE HCL PF 1 MG/ML IJ SOLN: 0.2500 mg | INTRAMUSCULAR | Status: DC | PRN

## 2011-11-24 MED ORDER — FENTANYL CITRATE 0.05 MG/ML IJ SOLN
INTRAMUSCULAR | Status: DC | PRN
  Administered 2011-11-24: 200 ug via INTRAVENOUS

## 2011-11-24 SURGICAL SUPPLY — 43 items
BENZOIN TINCTURE PRP APPL 2/3 (GAUZE/BANDAGES/DRESSINGS) ×2 IMPLANT
BLADE SURG 10 STRL SS (BLADE) IMPLANT
BLADE SURG 15 STRL LF DISP TIS (BLADE) ×1 IMPLANT
BLADE SURG 15 STRL SS (BLADE) ×1
CHLORAPREP W/TINT 26ML (MISCELLANEOUS) ×2 IMPLANT
CLEANER TIP ELECTROSURG 2X2 (MISCELLANEOUS) IMPLANT
CLOTH BEACON ORANGE TIMEOUT ST (SAFETY) ×2 IMPLANT
CONT SPEC 4OZ CLIKSEAL STRL BL (MISCELLANEOUS) ×2 IMPLANT
COVER SURGICAL LIGHT HANDLE (MISCELLANEOUS) ×2 IMPLANT
DECANTER SPIKE VIAL GLASS SM (MISCELLANEOUS) ×2 IMPLANT
DRAPE PED LAPAROTOMY (DRAPES) ×2 IMPLANT
DRAPE UTILITY 15X26 W/TAPE STR (DRAPE) ×4 IMPLANT
ELECT CAUTERY BLADE 6.4 (BLADE) ×2 IMPLANT
ELECT REM PT RETURN 9FT ADLT (ELECTROSURGICAL) ×2
ELECTRODE REM PT RTRN 9FT ADLT (ELECTROSURGICAL) ×1 IMPLANT
GAUZE SPONGE 2X2 8PLY STRL LF (GAUZE/BANDAGES/DRESSINGS) ×1 IMPLANT
GAUZE SPONGE 4X4 16PLY XRAY LF (GAUZE/BANDAGES/DRESSINGS) ×2 IMPLANT
GLOVE BIO SURGEON STRL SZ 6.5 (GLOVE) IMPLANT
GLOVE BIO SURGEON STRL SZ7.5 (GLOVE) ×2 IMPLANT
GLOVE BIOGEL PI IND STRL 7.5 (GLOVE) ×1 IMPLANT
GLOVE BIOGEL PI INDICATOR 7.5 (GLOVE) ×1
GLOVE EXAM NITRILE XL STR (GLOVE) ×2 IMPLANT
GOWN STRL NON-REIN LRG LVL3 (GOWN DISPOSABLE) ×2 IMPLANT
GOWN STRL REIN XL XLG (GOWN DISPOSABLE) ×2 IMPLANT
KIT BASIN OR (CUSTOM PROCEDURE TRAY) ×2 IMPLANT
KIT ROOM TURNOVER OR (KITS) ×2 IMPLANT
MARKER SKIN DUAL TIP RULER LAB (MISCELLANEOUS) IMPLANT
NEEDLE HYPO 25GX1X1/2 BEV (NEEDLE) ×2 IMPLANT
NS IRRIG 1000ML POUR BTL (IV SOLUTION) ×2 IMPLANT
PACK SURGICAL SETUP 50X90 (CUSTOM PROCEDURE TRAY) ×2 IMPLANT
PAD ARMBOARD 7.5X6 YLW CONV (MISCELLANEOUS) ×2 IMPLANT
PENCIL BUTTON HOLSTER BLD 10FT (ELECTRODE) ×2 IMPLANT
SPONGE GAUZE 2X2 STER 10/PKG (GAUZE/BANDAGES/DRESSINGS) ×1
SPONGE GAUZE 4X4 12PLY (GAUZE/BANDAGES/DRESSINGS) IMPLANT
STRIP CLOSURE SKIN 1/4X4 (GAUZE/BANDAGES/DRESSINGS) IMPLANT
STRIP CLOSURE SKIN 1X5 (GAUZE/BANDAGES/DRESSINGS) ×2 IMPLANT
SUT MON AB 4-0 PC3 18 (SUTURE) ×2 IMPLANT
SUT VIC AB 3-0 SH 18 (SUTURE) ×2 IMPLANT
SYR CONTROL 10ML LL (SYRINGE) ×2 IMPLANT
TAPE CLOTH SOFT 2X10 (GAUZE/BANDAGES/DRESSINGS) ×2 IMPLANT
TOWEL OR 17X24 6PK STRL BLUE (TOWEL DISPOSABLE) IMPLANT
TOWEL OR 17X26 10 PK STRL BLUE (TOWEL DISPOSABLE) ×2 IMPLANT
WATER STERILE IRR 1000ML POUR (IV SOLUTION) IMPLANT

## 2011-11-24 NOTE — Transfer of Care (Signed)
Immediate Anesthesia Transfer of Care Note  Patient: Patrick [name]  Procedure(s) Performed: Procedure(s) (LRB): AXILLARY LYMPH NODE BIOPSY (Left)  Patient Location: PACU  Anesthesia Type: General  Level of Consciousness: sedated  Airway & Oxygen Therapy: Patient Spontanous Breathing and Patient connected to face mask oxygen  Post-op Assessment: Report given to PACU RN and Post -op Vital signs reviewed and stable  Post vital signs: Reviewed and stable  Complications: No apparent anesthesia complications

## 2011-11-24 NOTE — Preoperative (Signed)
Beta Blockers   Reason not to administer Beta Blockers:Not Applicable 

## 2011-11-24 NOTE — Progress Notes (Signed)
BP 121/71  Pulse 94  Temp(Src) 98.3 F (36.8 C) (Oral)  Resp 18  Ht 6' (1.829 m)  Wt 104.872 kg (231 lb 3.2 oz)  BMI 31.36 kg/m2  SpO2 96% No new ?? For (L)ax LN bx today  Will need guidance from medical and ID services regarding desired testing. Please call us with specifics for lab  Patrick [name] [phone] 9:29 AM 11/24/2011

## 2011-11-24 NOTE — Op Note (Signed)
NAMEMarland Kitchen  Patrick [name], Patrick [name] NO.:  [id number]  MEDICAL RECORD NO.:  [id number]  LOCATION:  5153                         FACILITY:  [facility]  PHYSICIAN:  Velora Heckler, MD      DATE OF BIRTH:  [birth year]  DATE OF PROCEDURE:  11/24/2011                               OPERATIVE REPORT   PREOPERATIVE DIAGNOSIS:  Diffuse adenopathy.  POSTOPERATIVE DIAGNOSIS:  Diffuse adenopathy.  PROCEDURE:  Left axillary lymph node excisional biopsy (3 nodes)  SURGEON:  Velora Heckler, MD, FACS  ANESTHESIA:  General, per Maren Beach, MD  ESTIMATED BLOOD LOSS:  Minimal.  PREPARATION:  ChloraPrep.  COMPLICATIONS:  None.  INDICATIONS:  The patient is a 51 year old white male with history of HIV infection.  He has had intermittent episodes of diffuse lymphadenopathy.  He underwent a previous right axillary lymph node biopsy 1 year ago, without definitive diagnosis.  The patient is now admitted on the Medical Service.  He is being seen by Infectious Disease.  They have recommended left node biopsy for cultures and histology.  The patient comes to the operating room today for left axillary excisional lymph node biopsy.  BODY OF REPORT:  Procedures done in OR number 17 at the [facility].  The patient was brought to the operating room, placed in supine position on the operating room table.  Following administration of general anesthesia, the patient was positioned and then prepped and draped in the usual strict aseptic fashion.  After ascertaining that an adequate level of anesthesia had been achieved, a left axillary incision was made with a #15 blade.  Dissection was carried into subcutaneous tissues.  Lymph nodes were easily palpable in the low axilla.  These were grasped with an Allis clamp and elevated. Venous tributaries were divided between medium Ligaclips.  Using the electrocautery for hemostasis, three separate lymph nodes were completely excised and  submitted in saline to Pathology for review.  All requested diagnostic studies are included.  Good hemostasis was obtained in the wound.  Subcutaneous tissues were closed with interrupted 3-0 Vicryl sutures.  Skin was anesthetized with local anesthetic.  Skin edges were reapproximated with a running 4-0 Monocryl subcuticular suture.  Wound was washed and dried and benzoin and Steri- Strips were applied.  Sterile dressings were applied.  The patient was awakened from anesthesia and brought to the recovery room.  The patient tolerated the procedure well.   Velora Heckler, MD, FACS   TMG/MEDQ  D:  11/24/2011  T:  11/24/2011  Job:  [id number]

## 2011-11-24 NOTE — Anesthesia Procedure Notes (Signed)
Procedure Name: LMA Insertion Date/Time: 11/24/2011 1:02 PM Performed by: Rosita Fire Pre-anesthesia Checklist: Patient identified, Emergency Drugs available, Suction available, Patient being monitored and Timeout performed Patient Re-evaluated:Patient Re-evaluated prior to inductionOxygen Delivery Method: Circle system utilized Intubation Type: IV induction LMA Size: 4.0 Placement Confirmation: positive ETCO2 Tube secured with: Tape Dental Injury: Teeth and Oropharynx as per pre-operative assessment

## 2011-11-24 NOTE — Anesthesia Postprocedure Evaluation (Signed)
  Anesthesia Post-op Note  Patient: Patrick [name]  Procedure(s) Performed: Procedure(s) (LRB): AXILLARY LYMPH NODE BIOPSY (Left)  Patient Location: PACU  Anesthesia Type: General  Level of Consciousness: awake, alert , oriented and patient cooperative  Airway and Oxygen Therapy: Patient Spontanous Breathing and Patient connected to nasal cannula oxygen  Post-op Pain: none  Post-op Assessment: Post-op Vital signs reviewed, Patient's Cardiovascular Status Stable, Respiratory Function Stable, Patent Airway, No signs of Nausea or vomiting and Pain level controlled  Post-op Vital Signs: stable  Complications: No apparent anesthesia complications

## 2011-11-24 NOTE — Brief Op Note (Signed)
11/21/2011 - 11/24/2011  1:48 PM  PATIENT:  Patrick [name]  51 y.o. male  PRE-OPERATIVE DIAGNOSIS:  lymphadenopathy  POST-OPERATIVE DIAGNOSIS:  lymphadenopathy  PROCEDURE:  Procedure(s) (LRB): AXILLARY LYMPH NODE BIOPSY (Left)  SURGEON:  Surgeon(s) and Role:    * Velora Heckler, MD - Primary  ASSISTANTS: none   ANESTHESIA:   general  EBL:     BLOOD ADMINISTERED:none  DRAINS: none   LOCAL MEDICATIONS USED:  MARCAINE     SPECIMEN:  Excision  DISPOSITION OF SPECIMEN:  PATHOLOGY  COUNTS:  YES  TOURNIQUET:  * No tourniquets in log *  DICTATION: .Other Dictation: Dictation Number [id number]  PLAN OF CARE: Admit to inpatient   PATIENT DISPOSITION:  PACU - hemodynamically stable.   Delay start of Pharmacological VTE agent (>24hrs) due to surgical blood loss or risk of bleeding: yes  Velora Heckler, MD, FACS General & Endocrine Surgery [facility]

## 2011-11-24 NOTE — Anesthesia Preprocedure Evaluation (Signed)
Anesthesia Evaluation  Patient identified by MRN, date of birth, ID band Patient awake    Reviewed: Allergy & Precautions, H&P , NPO status , Patient's Chart, lab work & pertinent test results  Airway Mallampati: I TM Distance: >3 FB Neck ROM: full    Dental   Pulmonary          Cardiovascular hypertension, regular Normal    Neuro/Psych    GI/Hepatic   Endo/Other    Renal/GU CRF     Musculoskeletal   Abdominal   Peds  Hematology  (+) HIV,   Anesthesia Other Findings   Reproductive/Obstetrics                           Anesthesia Physical Anesthesia Plan  ASA: III  Anesthesia Plan: General LMA   Post-op Pain Management:    Induction: Intravenous  Airway Management Planned: LMA  Additional Equipment:   Intra-op Plan:   Post-operative Plan: Extubation in OR  Informed Consent: I have reviewed the patients History and Physical, chart, labs and discussed the procedure including the risks, benefits and alternatives for the proposed anesthesia with the patient or authorized representative who has indicated his/her understanding and acceptance.     Plan Discussed with: Anesthesiologist, CRNA and Surgeon  Anesthesia Plan Comments:         Anesthesia Quick Evaluation

## 2011-11-24 NOTE — Progress Notes (Addendum)
Subjective: For LN biopsy today.  No events overnight.  Stable vitals.  Objective: Vital signs in last 24 hours: Filed Vitals:   11/22/11 2201 11/23/11 0626 11/23/11 2129 11/24/11 0549  BP: 109/45 133/63 130/58 121/71  Pulse: 83 93 94 94  Temp:  98.5 F (36.9 C) 98.5 F (36.9 C) 98.3 F (36.8 C)  TempSrc:  Oral Oral Oral  Resp:  18 18 18   Height:      Weight:  233 lb 0.4 oz (105.7 kg)  231 lb 3.2 oz (104.872 kg)  SpO2:  98% 98% 96%   Weight change: -1 lb 13.2 oz (-0.828 kg) No intake or output data in the 24 hours ending 11/24/11 1027 Physical Exam: Filed Vitals:   11/22/11 2201 11/23/11 0626 11/23/11 2129 11/24/11 0549  BP: 109/45 133/63 130/58 121/71  Pulse: 83 93 94 94  Temp:  98.5 F (36.9 C) 98.5 F (36.9 C) 98.3 F (36.8 C)  TempSrc:  Oral Oral Oral  Resp:  18 18 18   Height:      Weight:  233 lb 0.4 oz (105.7 kg)  231 lb 3.2 oz (104.872 kg)  SpO2:  98% 98% 96%   GEN: NAD.  Alert and oriented x 3.  Pleasant, conversant, and cooperative to exam. RESP:  CTAB, no w/r/r CARDIOVASCULAR: RRR, S1, S2, no m/r/g EXT: warm and dry. No edema in b/l LE.  Extensive LAD of b/l neck, axilla, and groin, mildly tender to palpation  Lab Results: Basic Metabolic Panel:  Lab 11/24/11 4098 11/23/11 0455  NA 137 136  K 5.1 5.0  CL 106 107  CO2 24 23  GLUCOSE 181* 194*  BUN 16 20  CREATININE 1.42* 1.47*  CALCIUM 9.2 9.0  MG -- --  PHOS -- --   Liver Function Tests:  Lab 11/22/11 0610  AST 8  ALT 7  ALKPHOS 50  BILITOT 0.2*  PROT 7.5  ALBUMIN 2.5*   CBC:  Lab 11/24/11 0627 11/22/11 0610 11/21/11 1748  WBC 3.5* 3.5* --  NEUTROABS -- -- 2.1  HGB 7.8* 7.9* --  HCT 24.5* 24.2* --  MCV 90.7 89.3 --  PLT 151 121* --   Urinalysis:  Lab 11/21/11 2246  COLORURINE YELLOW  LABSPEC 1.027  PHURINE 5.5  GLUCOSEU NEGATIVE  HGBUR TRACE*  BILIRUBINUR SMALL*  KETONESUR 15*  PROTEINUR 100*  UROBILINOGEN 0.2  NITRITE NEGATIVE  LEUKOCYTESUR NEGATIVE   Micro  Results: Recent Results (from the past 240 hour(s))  CULTURE, BLOOD (SINGLE)     Status: Normal   Collection Time   11/17/11  9:49 AM      Component Value Range Status Comment   Organism ID, Bacteria NO GROWTH 5 DAYS   Final   CULTURE, BLOOD (SINGLE)     Status: Normal   Collection Time   11/17/11  9:52 AM      Component Value Range Status Comment   Organism ID, Bacteria NO GROWTH 5 DAYS   Final   CULTURE, BLOOD (ROUTINE X 2)     Status: Normal (Preliminary result)   Collection Time   11/21/11  5:50 PM      Component Value Range Status Comment   Specimen Description BLOOD LEFT ARM   Final    Special Requests     Final    Value: BOTTLES DRAWN AEROBIC AND ANAEROBIC 10CC BLUE 5CC RED   Culture  Setup Time [id number]   Final    Culture     Final    Value:  BLOOD CULTURE RECEIVED NO GROWTH TO DATE CULTURE WILL BE HELD FOR 5 DAYS BEFORE ISSUING A FINAL NEGATIVE REPORT   Report Status PENDING   Incomplete   CULTURE, BLOOD (ROUTINE X 2)     Status: Normal (Preliminary result)   Collection Time   11/21/11  5:59 PM      Component Value Range Status Comment   Specimen Description BLOOD RIGHT HAND   Final    Special Requests BOTTLES DRAWN AEROBIC AND ANAEROBIC 10CC   Final    Culture  Setup Time [id number]   Final    Culture     Final    Value:        BLOOD CULTURE RECEIVED NO GROWTH TO DATE CULTURE WILL BE HELD FOR 5 DAYS BEFORE ISSUING A FINAL NEGATIVE REPORT   Report Status PENDING   Incomplete   MRSA PCR SCREENING     Status: Normal   Collection Time   11/21/11 11:07 PM      Component Value Range Status Comment   MRSA by PCR NEGATIVE  NEGATIVE  Final   FUNGUS CULTURE, BLOOD     Status: Normal (Preliminary result)   Collection Time   11/22/11  3:00 PM      Component Value Range Status Comment   Specimen Description BLOOD LEFT ARM   Final    Special Requests BOTTLES DRAWN AEROBIC AND ANAEROBIC 10CC   Final    Culture NO FUNGUS ISOLATED;CULTURE IN PROGRESS FOR 7 DAYS   Final     Report Status PENDING   Incomplete   CULTURE, BLOOD (ROUTINE X 2)     Status: Normal (Preliminary result)   Collection Time   11/22/11  3:06 PM      Component Value Range Status Comment   Specimen Description BLOOD LEFT ARM   Final    Special Requests BOTTLES DRAWN AEROBIC AND ANAEROBIC 10CC   Final    Culture  Setup Time [id number]   Final    Culture     Final    Value:        BLOOD CULTURE RECEIVED NO GROWTH TO DATE CULTURE WILL BE HELD FOR 5 DAYS BEFORE ISSUING A FINAL NEGATIVE REPORT   Report Status PENDING   Incomplete   AFB CULTURE, BLOOD     Status: Normal (Preliminary result)   Collection Time   11/22/11  4:29 PM      Component Value Range Status Comment   Specimen Description BLOOD LEFT HAND   Final    Special Requests BOTTLES DRAWN AEROBIC ONLY 5CC   Final    Culture     Final    Value: CULTURE WILL BE EXAMINED FOR 6 WEEKS BEFORE ISSUING A FINAL REPORT   Report Status PENDING   Incomplete   CULTURE, BLOOD (ROUTINE X 2)     Status: Normal (Preliminary result)   Collection Time   11/22/11  4:29 PM      Component Value Range Status Comment   Specimen Description BLOOD LEFT HAND   Final    Special Requests     Final    Value: BOTTLES DRAWN AEROBIC AND ANAEROBIC 10CC AER, 5CC ANA   Culture  Setup Time [id number]   Final    Culture     Final    Value:        BLOOD CULTURE RECEIVED NO GROWTH TO DATE CULTURE WILL BE HELD FOR 5 DAYS BEFORE ISSUING A FINAL NEGATIVE REPORT   Report Status PENDING  Incomplete   FUNGUS CULTURE, BLOOD     Status: Normal (Preliminary result)   Collection Time   11/22/11  4:29 PM      Component Value Range Status Comment   Specimen Description BLOOD LEFT HAND   Final    Special Requests BOTTLES DRAWN AEROBIC AND ANAEROBIC 10CCAER 5CCANA   Final    Culture NO FUNGUS ISOLATED;CULTURE IN PROGRESS FOR 7 DAYS   Final    Report Status PENDING   Incomplete    Studies/Results: No results found. Medications: I have reviewed the patient's current  medications. Scheduled Meds:    . amLODipine  10 mg Oral Daily  . chlorpheniramine-HYDROcodone  5 mL Oral Q12H  . dapsone  100 mg Oral Daily  . efavirenz-emtrictabine-tenofovir  1 tablet Oral QHS  . enoxaparin  40 mg Subcutaneous Daily  . labetalol  200 mg Oral BID  . sodium chloride  3 mL Intravenous Q12H   Continuous Infusions:   PRN Meds:.acetaminophen, acetaminophen, HYDROcodone-acetaminophen, ondansetron (ZOFRAN) IV, ondansetron, sodium chloride  Assessment/Plan: # LN Biopsy Scheduled for today.  Put in orders for select tissue studies: - AFB culture - AFB stain - fungal culture - did not order for routine pathology evalutation, will leave that to surgery team  # Fever and LAD Pt has waxing/waning LAD and intermittent fevers with neg LN bx one year ago.  Given con't fevers and LAD, pt admitted for further workup.  Ddx includes lymphoma, mycobacterial or fungal infxn, less likely IRIS (especially given worsening CD4).  CD4 of 110 (just came back) increases risk of OI.  Getting LN biopsy today - LN biopsy today - consider bone marrow biopsy after LN results return - f/u blood cx - f/u fungal cx - f/u AFB cx - f/u CD4/viral load - f/u quantiferon  # Acute anemia, normocytic Hb stable but still decreased. - CBC in the am - f/u anemia panel in a.m. - Check FOBT   # HIV CD4 returned at 110 today, down from prior.  Starting dapsone for ppx, will consider changing HIV regimen - dapsone 100mg  qd - consider changes to HIV regimen - f/u viral load  # AKI on CKD Improving with gentle fluids. - Repeat BMET in am    # DVT prophylaxis - lovenox   LOS: 3 days   Abner Greenspan, BEN 11/24/2011, 10:27 AM  Internal Medicine Teaching Service Attending Note Date: 11/24/2011  Patient name: Patrick [name]  Medical record number: [id number]  Date of birth: [birth year]    This patient has been seen and discussed with Dr. Abner Greenspan. Please see his note for complete  details. I concur with his findings and plan as described by Dr. Abner Greenspan. Await for path and culture results from lymph node excisional biopsy.  Judyann Munson 11/24/2011, 3:52 PM

## 2011-11-24 NOTE — Progress Notes (Signed)
[facility] ([facility]) - ATTENDING: Noted.  Information received from medical service.  Testing requested on lymph nodes. Velora Heckler, MD, FACS General & Endocrine Surgery [facility]

## 2011-11-25 LAB — BASIC METABOLIC PANEL
Chloride: 104 mEq/L (ref 96–112)
Creatinine, Ser: 1.44 mg/dL — ABNORMAL HIGH (ref 0.50–1.35)
GFR calc Af Amer: 64 mL/min — ABNORMAL LOW (ref >90)

## 2011-11-25 LAB — CBC
HCT: 21.9 % — ABNORMAL LOW (ref 39.0–52.0)
Platelets: 160 10*3/uL (ref 150–400)
RDW: 16.9 % — ABNORMAL HIGH (ref 11.5–15.5)
WBC: 3 10*3/uL — ABNORMAL LOW (ref 4.0–10.5)

## 2011-11-25 LAB — QUANTIFERON TB GOLD ASSAY (BLOOD): Interferon Gamma Release Assay: NEGATIVE

## 2011-11-25 MED ORDER — LABETALOL HCL 200 MG PO TABS
200.0000 mg | ORAL_TABLET | Freq: Two times a day (BID) | ORAL | Status: DC
  Administered 2011-11-26 – 2011-11-28 (×5): 200 mg via ORAL
  Filled 2011-11-25 (×7): qty 1

## 2011-11-25 MED ORDER — SENNOSIDES-DOCUSATE SODIUM 8.6-50 MG PO TABS
2.0000 | ORAL_TABLET | Freq: Every day | ORAL | Status: DC
  Administered 2011-11-25 – 2011-11-27 (×2): 2 via ORAL
  Filled 2011-11-25: qty 1
  Filled 2011-11-25: qty 2
  Filled 2011-11-25: qty 1

## 2011-11-25 MED ORDER — SODIUM CHLORIDE 0.9 % IV SOLN
1000.0000 mg | Freq: Once | INTRAVENOUS | Status: AC
  Administered 2011-11-25: 1000 mg via INTRAVENOUS
  Filled 2011-11-25 (×2): qty 20

## 2011-11-25 MED ORDER — SODIUM CHLORIDE 0.9 % IV SOLN
500.0000 mg | Freq: Once | INTRAVENOUS | Status: AC
  Administered 2011-11-26: 500 mg via INTRAVENOUS
  Filled 2011-11-25 (×2): qty 10

## 2011-11-25 MED ORDER — CYANOCOBALAMIN 1000 MCG/ML IJ SOLN
1000.0000 ug | Freq: Once | INTRAMUSCULAR | Status: AC
  Administered 2011-11-25: 1000 ug via INTRAMUSCULAR
  Filled 2011-11-25: qty 1

## 2011-11-25 MED ORDER — SODIUM CHLORIDE 0.9 % IV SOLN
25.0000 mg | Freq: Once | INTRAVENOUS | Status: AC
  Administered 2011-11-25: 25 mg via INTRAVENOUS
  Filled 2011-11-25: qty 0.5

## 2011-11-25 NOTE — Progress Notes (Addendum)
Resident Co-sign Daily Note:  I have seen the patient and reviewed the daily progress note by MS3 and discussed the care of the patient with them. See below for documentation of my findings, assessment, and plans.  Subjective:  S/p LN bx yesterday. Some night sweats. No other c/o.  Objective:  Vital signs in last 24 hours:  Filed Vitals:    11/25/11 0224  11/25/11 0557  11/25/11 1151  11/25/11 1152   BP:  110/65  106/47  100/62  100/62   Pulse:  79  79   70   Temp:  97.7 F (36.5 C)  98.1 F (36.7 C)     TempSrc:  Oral  Oral     Resp:  16  18     Height:       Weight:       SpO2:  95%  97%      Physical Exam:  GEN: NAD. Alert and oriented x 3. Pleasant, conversant, and cooperative to exam.  RESP: CTAB, no w/r/r  CARDIOVASCULAR: RRR, S1, S2, no m/r/g  ABDOMEN: soft, NT/ND, NABS  EXT: warm and dry. No edema in b/l LE. Bandaged L axillary wound. No drainage on dressing.  Lab Results:  Reviewed and documented in Electronic Record  Micro Results:  Reviewed and documented in Electronic Record  Studies/Results:  Reviewed and documented in Electronic Record  Medications: I have reviewed the patient's current medications.  Scheduled Meds:  .  amLODipine  10 mg  Oral  Daily   .  chlorpheniramine-HYDROcodone  5 mL  Oral  Q12H   .  cyanocobalamin  1,000 mcg  Intramuscular  Once   .  dapsone  100 mg  Oral  Daily   .  efavirenz-emtrictabine-tenofovir  1 tablet  Oral  QHS   .  enoxaparin  40 mg  Subcutaneous  Daily   .  iron dextran (INFED/DEXFERRUM) 1000 MG IVPB  1,000 mg  Intravenous  Once   .  iron dextran (INFED/DEXFERRUM) IVPB (TEST DOSE)  25 mg  Intravenous  Once   .  iron dextran (INFED/DEXFERRRUM) 500 MG IVPB  500 mg  Intravenous  Once   .  labetalol  200 mg  Oral  BID   .  senna-docusate  2 tablet  Oral  QHS   .  sodium chloride  3 mL  Intravenous  Q12H    Continuous Infusions:  .  dextrose 5 % and 0.45 % NaCl with KCl 20 mEq/L  75 mL/hr at 11/25/11 0409   .  DISCONTD:  lactated ringers  50 mL/hr at 11/24/11 1228    PRN Meds:.acetaminophen, acetaminophen, acetaminophen, HYDROcodone-acetaminophen, HYDROmorphone, ondansetron (ZOFRAN) IV, ondansetron, promethazine, sodium chloride, DISCONTD: 0.9 % irrigation (POUR BTL), DISCONTD: bupivacaine, DISCONTD: HYDROcodone-acetaminophen, DISCONTD: HYDROmorphone, DISCONTD: ondansetron (ZOFRAN) IV  Assessment/Plan:  # Fever and LAD  S/p LN bx. Ddx includes lymphoma, mycobacterial or fungal infxn, less likely IRIS (especially given worsening CD4). CD4 of 110 increases risk of OI.  - f/u pathology and bx cultures  - consider bone marrow biopsy after LN results return  - f/u blood cx  - f/u fungal cx  - f/u AFB cx  - f/u quantiferon   # Acute anemia, normocytic  Hb down to 7.0, definitely below baseline. Anemia panel shows component of ACD and B12 def.  - replete B12  - replete with IV then oral Fe  - repeat CBC  - Check FOBT   # HIV  CD4 returned at 110 today, viral  load <20. Dapsone for ppx, will consider changing HIV regimen in future. Drop from 220 could be 2/2 acute infxn vs worsening disease.  - dapsone 100mg  qd  - con't atripla   # AKI on CKD  Improving with gentle fluids, around baseline of 1.3-1.4  - Repeat BMET in am   # DVT prophylaxis  - SCD's  LOS: 4 days   Abner Greenspan, BEN  11/25/2011, 1:26 PM   Internal Medicine Teaching Service Attending Note Date: 11/25/2011  Patient name: Patrick [name]  Medical record number: [id number]  Date of birth: 08-29-1961    This patient has been seen and discussed with the house staff. Please see their note for complete details. I concur with their findings with the following additions/corrections: Patient underwent axillary lymph node excisional biopsy yesterday for the specimen to be sent for path to rule out lymphoma as well as afb and fungal culture. It appears that the specimen never made it to pathology in a timely manner. It is now being prepared  for histology, however the specimen is not viable for flow cytometry to look at cell markers that would be consistent with various lymphomas. We will discuss with hematopath our next step if we should pursue FNA in order to undergo flow. Patient still suffers fro nightsweats, although no documented fevers. Continue with his hiv medications and oi prophylaxis in the meantime.  Judyann Munson 11/25/2011, 4:11 PM

## 2011-11-25 NOTE — Progress Notes (Signed)
No reaction to test dose of iron

## 2011-11-25 NOTE — Progress Notes (Signed)
Medical Student Daily Progress Note  Subjective: NAEON. LN biopsy yesterday. Endorses sweats during the day and night, some cough, and dizziness aggravated sitting up suddenly. Tolerating regular diet, includes meats and vegetables. Objective: Vital signs in last 24 hours: Filed Vitals:   11/24/11 1424 11/24/11 2115 11/25/11 0224 11/25/11 0557  BP:  112/62 110/65 106/47  Pulse: 83 89 79 79  Temp:  98.9 F (37.2 C) 97.7 F (36.5 C) 98.1 F (36.7 C)  TempSrc:  Oral Oral Oral  Resp: 17 16 16 18   Height:      Weight:      SpO2: 95% 97% 95% 97%   Weight change:   Intake/Output Summary (Last 24 hours) at 11/25/11 1148 Last data filed at 11/25/11 0544  Gross per 24 hour  Intake 1944.78 ml  Output      0 ml  Net 1944.78 ml   Physical Exam: Vitals reviewed. General: resting in bed, NAD Cardiac: RRR, no rubs, murmurs or gallops Pulm: clear to auscultation bilaterally, no wheezes, rales, or rhonchi Abd: soft, nontender, nondistended, BS present Ext: warm and well perfused, no pedal edema  Lab Results: Basic Metabolic Panel:  Lab 11/25/11 1610 11/24/11 0627  NA 135 137  K 4.6 5.1  CL 104 106  CO2 23 24  GLUCOSE 175* 181*  BUN 14 16  CREATININE 1.44* 1.42*  CALCIUM 8.8 9.2  MG -- --  PHOS -- --   Liver Function Tests:  Lab 11/22/11 0610  AST 8  ALT 7  ALKPHOS 50  BILITOT 0.2*  PROT 7.5  ALBUMIN 2.5*   No results found for this basename: LIPASE:2,AMYLASE:2 in the last 168 hours No results found for this basename: AMMONIA:2 in the last 168 hours CBC:  Lab 11/25/11 0635 11/24/11 0627 11/21/11 1748  WBC 3.0* 3.5* --  NEUTROABS -- -- 2.1  HGB 7.0* 7.8* --  HCT 21.9* 24.5* --  MCV 90.1 90.7 --  PLT 160 151 --   CBG:  Lab 11/24/11 1446  GLUCAP 153*   Anemia Panel:  Lab 11/24/11 0627  VITAMINB12 131*  FOLATE 5.7  FERRITIN 395*  TIBC Not calculated due to Iron <10.  IRON <10*  RETICCTPCT 0.8   Urine Drug Screen: Drugs of Abuse     Component Value  Date/Time   LABOPIA NONE DETECTED 11/19/2010 0348   COCAINSCRNUR NONE DETECTED 11/19/2010 0348   LABBENZ NONE DETECTED 11/19/2010 0348   AMPHETMU NONE DETECTED 11/19/2010 0348   THCU NONE DETECTED 11/19/2010 0348   LABBARB  Value: NONE DETECTED        DRUG SCREEN FOR MEDICAL PURPOSES ONLY.  IF CONFIRMATION IS NEEDED FOR ANY PURPOSE, NOTIFY LAB WITHIN 5 DAYS.        LOWEST DETECTABLE LIMITS FOR URINE DRUG SCREEN Drug Class       Cutoff (ng/mL) Amphetamine      1000 Barbiturate      200 Benzodiazepine   200 Tricyclics       300 Opiates          300 Cocaine          300 THC              50 11/19/2010 0348    Alcohol Level: No results found for this basename: ETH:2 in the last 168 hours Urinalysis:  Lab 11/21/11 2246  COLORURINE YELLOW  LABSPEC 1.027  PHURINE 5.5  GLUCOSEU NEGATIVE  HGBUR TRACE*  BILIRUBINUR SMALL*  KETONESUR 15*  PROTEINUR 100*  UROBILINOGEN 0.2  NITRITE NEGATIVE  LEUKOCYTESUR NEGATIVE   Misc. Labs: none  Micro Results: Recent Results (from the past 240 hour(s))  CULTURE, BLOOD (SINGLE)     Status: Normal   Collection Time   11/17/11  9:49 AM      Component Value Range Status Comment   Organism ID, Bacteria NO GROWTH 5 DAYS   Final   CULTURE, BLOOD (SINGLE)     Status: Normal   Collection Time   11/17/11  9:52 AM      Component Value Range Status Comment   Organism ID, Bacteria NO GROWTH 5 DAYS   Final   CULTURE, BLOOD (ROUTINE X 2)     Status: Normal (Preliminary result)   Collection Time   11/21/11  5:50 PM      Component Value Range Status Comment   Specimen Description BLOOD LEFT ARM   Final    Special Requests     Final    Value: BOTTLES DRAWN AEROBIC AND ANAEROBIC 10CC BLUE 5CC RED   Culture  Setup Time [id number]   Final    Culture     Final    Value:        BLOOD CULTURE RECEIVED NO GROWTH TO DATE CULTURE WILL BE HELD FOR 5 DAYS BEFORE ISSUING A FINAL NEGATIVE REPORT   Report Status PENDING   Incomplete   CULTURE, BLOOD (ROUTINE X 2)     Status:  Normal (Preliminary result)   Collection Time   11/21/11  5:59 PM      Component Value Range Status Comment   Specimen Description BLOOD RIGHT HAND   Final    Special Requests BOTTLES DRAWN AEROBIC AND ANAEROBIC 10CC   Final    Culture  Setup Time [id number]   Final    Culture     Final    Value:        BLOOD CULTURE RECEIVED NO GROWTH TO DATE CULTURE WILL BE HELD FOR 5 DAYS BEFORE ISSUING A FINAL NEGATIVE REPORT   Report Status PENDING   Incomplete   MRSA PCR SCREENING     Status: Normal   Collection Time   11/21/11 11:07 PM      Component Value Range Status Comment   MRSA by PCR NEGATIVE  NEGATIVE  Final   FUNGUS CULTURE, BLOOD     Status: Normal (Preliminary result)   Collection Time   11/22/11  3:00 PM      Component Value Range Status Comment   Specimen Description BLOOD LEFT ARM   Final    Special Requests BOTTLES DRAWN AEROBIC AND ANAEROBIC 10CC   Final    Culture NO FUNGUS ISOLATED;CULTURE IN PROGRESS FOR 7 DAYS   Final    Report Status PENDING   Incomplete   CULTURE, BLOOD (ROUTINE X 2)     Status: Normal (Preliminary result)   Collection Time   11/22/11  3:06 PM      Component Value Range Status Comment   Specimen Description BLOOD LEFT ARM   Final    Special Requests BOTTLES DRAWN AEROBIC AND ANAEROBIC 10CC   Final    Culture  Setup Time [id number]   Final    Culture     Final    Value:        BLOOD CULTURE RECEIVED NO GROWTH TO DATE CULTURE WILL BE HELD FOR 5 DAYS BEFORE ISSUING A FINAL NEGATIVE REPORT   Report Status PENDING   Incomplete   AFB CULTURE, BLOOD     Status: Normal (  Preliminary result)   Collection Time   11/22/11  4:29 PM      Component Value Range Status Comment   Specimen Description BLOOD LEFT HAND   Final    Special Requests BOTTLES DRAWN AEROBIC ONLY 5CC   Final    Culture     Final    Value: CULTURE WILL BE EXAMINED FOR 6 WEEKS BEFORE ISSUING A FINAL REPORT   Report Status PENDING   Incomplete   CULTURE, BLOOD (ROUTINE X 2)     Status: Normal  (Preliminary result)   Collection Time   11/22/11  4:29 PM      Component Value Range Status Comment   Specimen Description BLOOD LEFT HAND   Final    Special Requests     Final    Value: BOTTLES DRAWN AEROBIC AND ANAEROBIC 10CC AER, 5CC ANA   Culture  Setup Time [id number]   Final    Culture     Final    Value:        BLOOD CULTURE RECEIVED NO GROWTH TO DATE CULTURE WILL BE HELD FOR 5 DAYS BEFORE ISSUING A FINAL NEGATIVE REPORT   Report Status PENDING   Incomplete   FUNGUS CULTURE, BLOOD     Status: Normal (Preliminary result)   Collection Time   11/22/11  4:29 PM      Component Value Range Status Comment   Specimen Description BLOOD LEFT HAND   Final    Special Requests BOTTLES DRAWN AEROBIC AND ANAEROBIC 10CCAER 5CCANA   Final    Culture NO FUNGUS ISOLATED;CULTURE IN PROGRESS FOR 7 DAYS   Final    Report Status PENDING   Incomplete   TISSUE CULTURE     Status: Normal (Preliminary result)   Collection Time   11/24/11  1:25 PM      Component Value Range Status Comment   Specimen Description TISSUE AXILLA LEFT   Final    Special Requests NONE   Final    Gram Stain     Final    Value: RARE WBC PRESENT, PREDOMINANTLY MONONUCLEAR     NO SQUAMOUS EPITHELIAL CELLS SEEN     NO ORGANISMS SEEN   Culture NO GROWTH   Final    Report Status PENDING   Incomplete    Studies/Results: No results found. Medications: I have reviewed the patient's current medications. Scheduled Meds:    . amLODipine  10 mg Oral Daily  . chlorpheniramine-HYDROcodone  5 mL Oral Q12H  . cyanocobalamin  1,000 mcg Intramuscular Once  . dapsone  100 mg Oral Daily  . efavirenz-emtrictabine-tenofovir  1 tablet Oral QHS  . enoxaparin  40 mg Subcutaneous Daily  . iron dextran (INFED/DEXFERRUM) IVPB (TEST DOSE)  25 mg Intravenous Once  . labetalol  200 mg Oral BID  . senna-docusate  2 tablet Oral QHS  . sodium chloride  3 mL Intravenous Q12H   Continuous Infusions:    . dextrose 5 % and 0.45 % NaCl with KCl 20  mEq/L 75 mL/hr at 11/25/11 0409  . DISCONTD: lactated ringers 50 mL/hr at 11/24/11 1228   PRN Meds:.acetaminophen, acetaminophen, acetaminophen, HYDROcodone-acetaminophen, HYDROmorphone, ondansetron (ZOFRAN) IV, ondansetron, promethazine, sodium chloride, DISCONTD: 0.9 % irrigation (POUR BTL), DISCONTD: bupivacaine, DISCONTD: HYDROcodone-acetaminophen, DISCONTD: HYDROmorphone, DISCONTD: ondansetron (ZOFRAN) IV Assessment/Plan: - Fever and LAD: nightsweats overnight but afebrile. S/p LN biopsy yesterday. Differential includes: lymphoma, MAC, fungal infection, less likely IRIS (CD4 decreased to 110).   - F/u with pathology for LN biopsy  - F/u with  blood culture.  - F/u with AFB culture.  - F/u with fungal culture. - Anemia: acute, normocytic. Retic, bilirubin is normal. Pt complains of some dizziness when sitting up. Iron panel shows low iron, elevated ferritin, consistent with AoCD. Vitamin B12 is low at 131, consistent with increased metabolic requirement due to illness.  - replete B12  - start IV iron.  - Possible bone marrow biopsy.  - Possible hemonc consult, blood smear. - Splenomegaly: Hx of recent infection. No hx of past Mono infection. Pt afebrile, but endoreses nightsweats and some cough. Denies sore throat, excessive fatigue. Differential includes lymphoma (infiltrative), HIV, Mono (inflammatory). - HIV: CD4 is 110, down from prior. Viral load is < 20.   - Dapsone 100mg  qd  - HIV regimen: efavirenz-emtrictabine-tenofovir 1 tab po qhs  - F/u viral load. - AKI on CKD: BUN 16 -> 14, Cr 1.42 -> 1.44. About his baseline.  - Continue to follow Cr. - DVT prophylaxis  - lovenox    LOS: 4 days   This is a Psychologist, occupational Note.  The care of the patient was discussed with Dr. Berlinda Last and the assessment and plan formulated with their assistance.  Please see their attached note for official documentation of the daily encounter.  Gayla Doss 11/25/2011, 11:48 AM

## 2011-11-25 NOTE — Progress Notes (Signed)
1 Day Post-Op  Subjective: Pt ok. NO new c/o Mildly sore under (L)arm  Objective: Vital signs in last 24 hours: Temp:  [97.6 F (36.4 C)-98.9 F (37.2 C)] 98.1 F (36.7 C) (02/27 0557) Pulse Rate:  [78-89] 79  (02/27 0557) Resp:  [15-22] 18  (02/27 0557) BP: (78-119)/(47-65) 106/47 mmHg (02/27 0557) SpO2:  [81 %-100 %] 97 % (02/27 0557) Last BM Date: 11/24/11  Intake/Output this shift:    Physical Exam: BP 106/47  Pulse 79  Temp(Src) 98.1 F (36.7 C) (Oral)  Resp 18  Ht 6' (1.829 m)  Wt 104.872 kg (231 lb 3.2 oz)  BMI 31.36 kg/m2  SpO2 97% (L)axillary area, no hematoma. Incision c/d/i, some bruising  Labs: CBC  Basename 11/25/11 0635 11/24/11 0627  WBC 3.0* 3.5*  HGB 7.0* 7.8*  HCT 21.9* 24.5*  PLT 160 151   BMET  Basename 11/25/11 0635 11/24/11 0627  NA 135 137  K 4.6 5.1  CL 104 106  CO2 23 24  GLUCOSE 175* 181*  BUN 14 16  CREATININE 1.44* 1.42*  CALCIUM 8.8 9.2   LFT No results found for this basename: PROT,ALBUMIN,AST,ALT,ALKPHOS,BILITOT,BILIDIR,IBILI,LIPASE in the last 72 hours PT/INR No results found for this basename: LABPROT:2,INR:2 in the last 72 hours ABG No results found for this basename: PHART:2,PCO2:2,PO2:2,HCO3:2 in the last 72 hours  Studies/Results: No results found.  Assessment: Principal Problem:  *LYMPHADENOPATHY, DIFFUSE Active Problems:  HIV DISEASE   Procedure(s): (L)AXILLARY LYMPH NODE BIOPSY  Plan: May shower area. Keep clean. Steristrips will fall off in 7-10 days. Follow up in our office prn. Signing off.  LOS: 4 days    Alyse Low 11/25/2011 9:29 AM

## 2011-11-25 NOTE — Progress Notes (Signed)
MEDICATION RELATED CONSULT NOTE - INITIAL   Pharmacy Consult for IV Iron Indication: Iron deficiency anemia  Allergies  Allergen Reactions  . Codeine Itching, Rash and Other (See Comments)    "crazy dreams"  . Sulfonamide Derivatives Rash    Patient Measurements: Height: 6' (182.9 cm) Weight: 231 lb 3.2 oz (104.872 kg) IBW/kg (Calculated) : 77.6   Vital Signs: Temp: 98.1 F (36.7 C) (02/27 0557) Temp src: Oral (02/27 0557) BP: 100/62 mmHg (02/27 1152) Pulse Rate: 70  (02/27 1152) Intake/Output from previous day: 02/26 0701 - 02/27 0700 In: 1944.8 [I.V.:1944.8] Out: -  Intake/Output from this shift:    Labs:  Basename 11/25/11 0635 11/24/11 0627 11/23/11 0455  WBC 3.0* 3.5* --  HGB 7.0* 7.8* --  HCT 21.9* 24.5* --  PLT 160 151 --  APTT -- -- --  CREATININE 1.44* 1.42* 1.47*  LABCREA -- -- --  CREATININE 1.44* 1.42* 1.47*  CREAT24HRUR -- -- --  MG -- -- --  PHOS -- -- --  ALBUMIN -- -- --  PROT -- -- --  ALBUMIN -- -- --  AST -- -- --  ALT -- -- --  ALKPHOS -- -- --  BILITOT -- -- --  BILIDIR -- -- --  IBILI -- -- --   Estimated Creatinine Clearance: 76.8 ml/min (by C-G formula based on Cr of 1.44).   Microbiology: Recent Results (from the past 720 hour(s))  CULTURE, BLOOD (SINGLE)     Status: Normal   Collection Time   11/17/11  9:49 AM      Component Value Range Status Comment   Organism ID, Bacteria NO GROWTH 5 DAYS   Final   CULTURE, BLOOD (SINGLE)     Status: Normal   Collection Time   11/17/11  9:52 AM      Component Value Range Status Comment   Organism ID, Bacteria NO GROWTH 5 DAYS   Final   CULTURE, BLOOD (ROUTINE X 2)     Status: Normal (Preliminary result)   Collection Time   11/21/11  5:50 PM      Component Value Range Status Comment   Specimen Description BLOOD LEFT ARM   Final    Special Requests     Final    Value: BOTTLES DRAWN AEROBIC AND ANAEROBIC 10CC BLUE 5CC RED   Culture  Setup Time [id number]   Final    Culture     Final     Value:        BLOOD CULTURE RECEIVED NO GROWTH TO DATE CULTURE WILL BE HELD FOR 5 DAYS BEFORE ISSUING A FINAL NEGATIVE REPORT   Report Status PENDING   Incomplete   CULTURE, BLOOD (ROUTINE X 2)     Status: Normal (Preliminary result)   Collection Time   11/21/11  5:59 PM      Component Value Range Status Comment   Specimen Description BLOOD RIGHT HAND   Final    Special Requests BOTTLES DRAWN AEROBIC AND ANAEROBIC 10CC   Final    Culture  Setup Time [id number]   Final    Culture     Final    Value:        BLOOD CULTURE RECEIVED NO GROWTH TO DATE CULTURE WILL BE HELD FOR 5 DAYS BEFORE ISSUING A FINAL NEGATIVE REPORT   Report Status PENDING   Incomplete   MRSA PCR SCREENING     Status: Normal   Collection Time   11/21/11 11:07 PM      Component  Value Range Status Comment   MRSA by PCR NEGATIVE  NEGATIVE  Final   FUNGUS CULTURE, BLOOD     Status: Normal (Preliminary result)   Collection Time   11/22/11  3:00 PM      Component Value Range Status Comment   Specimen Description BLOOD LEFT ARM   Final    Special Requests BOTTLES DRAWN AEROBIC AND ANAEROBIC 10CC   Final    Culture NO FUNGUS ISOLATED;CULTURE IN PROGRESS FOR 7 DAYS   Final    Report Status PENDING   Incomplete   CULTURE, BLOOD (ROUTINE X 2)     Status: Normal (Preliminary result)   Collection Time   11/22/11  3:06 PM      Component Value Range Status Comment   Specimen Description BLOOD LEFT ARM   Final    Special Requests BOTTLES DRAWN AEROBIC AND ANAEROBIC 10CC   Final    Culture  Setup Time [id number]   Final    Culture     Final    Value:        BLOOD CULTURE RECEIVED NO GROWTH TO DATE CULTURE WILL BE HELD FOR 5 DAYS BEFORE ISSUING A FINAL NEGATIVE REPORT   Report Status PENDING   Incomplete   AFB CULTURE, BLOOD     Status: Normal (Preliminary result)   Collection Time   11/22/11  4:29 PM      Component Value Range Status Comment   Specimen Description BLOOD LEFT HAND   Final    Special Requests BOTTLES DRAWN  AEROBIC ONLY 5CC   Final    Culture     Final    Value: CULTURE WILL BE EXAMINED FOR 6 WEEKS BEFORE ISSUING A FINAL REPORT   Report Status PENDING   Incomplete   CULTURE, BLOOD (ROUTINE X 2)     Status: Normal (Preliminary result)   Collection Time   11/22/11  4:29 PM      Component Value Range Status Comment   Specimen Description BLOOD LEFT HAND   Final    Special Requests     Final    Value: BOTTLES DRAWN AEROBIC AND ANAEROBIC 10CC AER, 5CC ANA   Culture  Setup Time [id number]   Final    Culture     Final    Value:        BLOOD CULTURE RECEIVED NO GROWTH TO DATE CULTURE WILL BE HELD FOR 5 DAYS BEFORE ISSUING A FINAL NEGATIVE REPORT   Report Status PENDING   Incomplete   FUNGUS CULTURE, BLOOD     Status: Normal (Preliminary result)   Collection Time   11/22/11  4:29 PM      Component Value Range Status Comment   Specimen Description BLOOD LEFT HAND   Final    Special Requests BOTTLES DRAWN AEROBIC AND ANAEROBIC 10CCAER 5CCANA   Final    Culture NO FUNGUS ISOLATED;CULTURE IN PROGRESS FOR 7 DAYS   Final    Report Status PENDING   Incomplete   TISSUE CULTURE     Status: Normal (Preliminary result)   Collection Time   11/24/11  1:25 PM      Component Value Range Status Comment   Specimen Description TISSUE AXILLA LEFT   Final    Special Requests NONE   Final    Gram Stain     Final    Value: RARE WBC PRESENT, PREDOMINANTLY MONONUCLEAR     NO SQUAMOUS EPITHELIAL CELLS SEEN     NO ORGANISMS SEEN   Culture  NO GROWTH   Final    Report Status PENDING   Incomplete     Medical History: Past Medical History  Diagnosis Date  . Hypertension   . Renal insufficiency     hydrated cr 1.5 gfr 48  . HIV (human immunodeficiency virus infection)   . Hypogonadism male     Assessment: 51 yo M admitted with fevers and enlarged lymph nodes 2/23.  Is s/p lymph node biopsy 2/26.  Now to begin IV iron for iron deficiency anemia (iron <10).  Iron deficit calculated as 1865 mg.  Also with acute on  chronic renal insufficiency.  Goal of Therapy:  Increase iron stores, hemoglobin; correct anemia  Plan:  1.  Iron Dextran 25 mg IV x 1 (test dose) 2.  If no reaction to test dose will treat with Iron Dextran 1000 mg IV x 1 today, then another 500 mg IV tomorrow (2/28) 3.  Bowel regimen started  Rolland Porter, [place].D., BCPS Clinical Pharmacist Pager: [phone]

## 2011-11-25 NOTE — Progress Notes (Deleted)
Resident Co-sign Daily Note: I have seen the patient and reviewed the daily progress note by MS3 and discussed the care of the patient with them.  See below for documentation of my findings, assessment, and plans.  Subjective: S/p LN bx yesterday.  Some night sweats.  No other c/o. Objective: Vital signs in last 24 hours: Filed Vitals:   11/25/11 0224 11/25/11 0557 11/25/11 1151 11/25/11 1152  BP: 110/65 106/47 100/62 100/62  Pulse: 79 79  70  Temp: 97.7 F (36.5 C) 98.1 F (36.7 C)    TempSrc: Oral Oral    Resp: 16 18    Height:      Weight:      SpO2: 95% 97%     Physical Exam: GEN: NAD.  Alert and oriented x 3.  Pleasant, conversant, and cooperative to exam. RESP:  CTAB, no w/r/r CARDIOVASCULAR: RRR, S1, S2, no m/r/g ABDOMEN: soft, NT/ND, NABS EXT: warm and dry. No edema in b/l LE.  Bandaged L axillary wound.  No drainage on dressing.  Lab Results: Reviewed and documented in Electronic Record Micro Results: Reviewed and documented in Electronic Record Studies/Results: Reviewed and documented in Electronic Record Medications: I have reviewed the patient's current medications. Scheduled Meds:   . amLODipine  10 mg Oral Daily  . chlorpheniramine-HYDROcodone  5 mL Oral Q12H  . cyanocobalamin  1,000 mcg Intramuscular Once  . dapsone  100 mg Oral Daily  . efavirenz-emtrictabine-tenofovir  1 tablet Oral QHS  . enoxaparin  40 mg Subcutaneous Daily  . iron dextran (INFED/DEXFERRUM) 1000 MG IVPB  1,000 mg Intravenous Once  . iron dextran (INFED/DEXFERRUM) IVPB (TEST DOSE)  25 mg Intravenous Once  . iron dextran (INFED/DEXFERRRUM) 500 MG IVPB  500 mg Intravenous Once  . labetalol  200 mg Oral BID  . senna-docusate  2 tablet Oral QHS  . sodium chloride  3 mL Intravenous Q12H   Continuous Infusions:   . dextrose 5 % and 0.45 % NaCl with KCl 20 mEq/L 75 mL/hr at 11/25/11 0409  . DISCONTD: lactated ringers 50 mL/hr at 11/24/11 1228   PRN Meds:.acetaminophen, acetaminophen,  acetaminophen, HYDROcodone-acetaminophen, HYDROmorphone, ondansetron (ZOFRAN) IV, ondansetron, promethazine, sodium chloride, DISCONTD: 0.9 % irrigation (POUR BTL), DISCONTD: bupivacaine, DISCONTD: HYDROcodone-acetaminophen, DISCONTD: HYDROmorphone, DISCONTD: ondansetron (ZOFRAN) IV  Assessment/Plan: # Fever and LAD  S/p LN bx. Ddx includes lymphoma, mycobacterial or fungal infxn, less likely IRIS (especially given worsening CD4). CD4 of 110 increases risk of OI. - f/u pathology and bx cultures - consider bone marrow biopsy after LN results return  - f/u blood cx  - f/u fungal cx  - f/u AFB cx  - f/u quantiferon   # Acute anemia, normocytic  Hb down to 7.0, definitely below baseline.  Anemia panel shows component of ACD and B12 def.  - replete B12 - replete with IV then oral Fe - repeat CBC - Check FOBT  # HIV  CD4 returned at 110 today, viral load <20. Dapsone for ppx, will consider changing HIV regimen in future.  Drop from 220 could be 2/2 acute infxn vs worsening disease. - dapsone 100mg  qd  - con't atripla  # AKI on CKD Improving with gentle fluids, around baseline of 1.3-1.4 - Repeat BMET in am   # DVT prophylaxis  - SCD's   LOS: 4 days   WILDMAN-TOBRINER, BEN 11/25/2011, 1:26 PM

## 2011-11-26 DIAGNOSIS — R599 Enlarged lymph nodes, unspecified: Secondary | ICD-10-CM

## 2011-11-26 LAB — BASIC METABOLIC PANEL
CO2: 25 mEq/L (ref 19–32)
Chloride: 104 mEq/L (ref 96–112)
Glucose, Bld: 212 mg/dL — ABNORMAL HIGH (ref 70–99)
Potassium: 4.8 mEq/L (ref 3.5–5.1)
Sodium: 135 mEq/L (ref 135–145)

## 2011-11-26 LAB — CBC
Hemoglobin: 7.6 g/dL — ABNORMAL LOW (ref 13.0–17.0)
MCH: 28.8 pg (ref 26.0–34.0)
RBC: 2.64 MIL/uL — ABNORMAL LOW (ref 4.22–5.81)

## 2011-11-26 MED ORDER — WHITE PETROLATUM GEL
Status: AC
  Administered 2011-11-26: 16:00:00
  Filled 2011-11-26: qty 5

## 2011-11-26 NOTE — Progress Notes (Addendum)
Medical Student Daily Progress Note  Subjective: NAEON. 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 Objective: Vital signs in last 24 hours: Filed Vitals:   11/25/11 2217 11/26/11 0603 11/26/11 1001 11/26/11 1350  BP: 122/59 109/58 134/57 125/58  Pulse: 89 83 87 82  Temp: 98.1 F (36.7 C) 98 F (36.7 C)  98.6 F (37 C)  TempSrc: Oral Oral  Oral  Resp: 18 18  16   Height:      Weight:  105 kg (231 lb 7.7 oz)    SpO2: 95% 97%  96%   Weight change:   Intake/Output Summary (Last 24 hours) at 11/26/11 1404 Last data filed at 11/26/11 1308  Gross per 24 hour  Intake   1684 ml  Output      0 ml  Net   1684 ml   Physical Exam: Vitals reviewed. General: resting in bed, NAD Cardiac: RRR, no rubs, murmurs or gallops Pulm: clear to auscultation bilaterally, no wheezes, rales, or rhonchi Abd: soft, nontender, nondistended, BS present Ext: warm and well perfused, no pedal edema  Lab Results: Basic Metabolic Panel:  Lab 11/26/11 6578 11/25/11 0635  NA 135 135  K 4.8 4.6  CL 104 104  CO2 25 23  GLUCOSE 212* 175*  BUN 9 14  CREATININE 1.26 1.44*  CALCIUM 9.1 8.8  MG -- --  PHOS -- --   Liver Function Tests:  Lab 11/22/11 0610  AST 8  ALT 7  ALKPHOS 50  BILITOT 0.2*  PROT 7.5  ALBUMIN 2.5*   No results found for this basename: LIPASE:2,AMYLASE:2 in the last 168 hours No results found for this basename: AMMONIA:2 in the last 168 hours CBC:  Lab 11/26/11 1009 11/25/11 0635 11/21/11 1748  WBC 3.9* 3.0* --  NEUTROABS -- -- 2.1  HGB 7.6* 7.0* --  HCT 23.5* 21.9* --  MCV 89.0 90.1 --  PLT 199 160 --   CBG:  Lab 11/24/11 1446  GLUCAP 153*   Anemia Panel:  Lab 11/24/11 0627  VITAMINB12 131*  FOLATE 5.7  FERRITIN 395*  TIBC Not calculated due to Iron <10.  IRON <10*  RETICCTPCT 0.8   Urine Drug Screen: Drugs of Abuse     Component Value Date/Time   LABOPIA NONE DETECTED 11/19/2010 0348   COCAINSCRNUR NONE DETECTED  11/19/2010 0348   LABBENZ NONE DETECTED 11/19/2010 0348   AMPHETMU NONE DETECTED 11/19/2010 0348   THCU NONE DETECTED 11/19/2010 0348   LABBARB  Value: NONE DETECTED        DRUG SCREEN FOR MEDICAL PURPOSES ONLY.  IF CONFIRMATION IS NEEDED FOR ANY PURPOSE, NOTIFY LAB WITHIN 5 DAYS.        LOWEST DETECTABLE LIMITS FOR URINE DRUG SCREEN Drug Class       Cutoff (ng/mL) Amphetamine      1000 Barbiturate      200 Benzodiazepine   200 Tricyclics       300 Opiates          300 Cocaine          300 THC              50 11/19/2010 0348    Alcohol Level: No results found for this basename: ETH:2 in the last 168 hours Urinalysis:  Lab 11/21/11 2246  COLORURINE YELLOW  LABSPEC 1.027  PHURINE 5.5  GLUCOSEU NEGATIVE  HGBUR TRACE*  BILIRUBINUR SMALL*  KETONESUR 15*  PROTEINUR 100*  UROBILINOGEN 0.2  NITRITE NEGATIVE  LEUKOCYTESUR NEGATIVE  Misc. Labs: none  Micro Results: Recent Results (from the past 240 hour(s))  CULTURE, BLOOD (SINGLE)     Status: Normal   Collection Time   11/17/11  9:49 AM      Component Value Range Status Comment   Organism ID, Bacteria NO GROWTH 5 DAYS   Final   CULTURE, BLOOD (SINGLE)     Status: Normal   Collection Time   11/17/11  9:52 AM      Component Value Range Status Comment   Organism ID, Bacteria NO GROWTH 5 DAYS   Final   CULTURE, BLOOD (ROUTINE X 2)     Status: Normal (Preliminary result)   Collection Time   11/21/11  5:50 PM      Component Value Range Status Comment   Specimen Description BLOOD LEFT ARM   Final    Special Requests     Final    Value: BOTTLES DRAWN AEROBIC AND ANAEROBIC 10CC BLUE 5CC RED   Culture  Setup Time [id number]   Final    Culture     Final    Value:        BLOOD CULTURE RECEIVED NO GROWTH TO DATE CULTURE WILL BE HELD FOR 5 DAYS BEFORE ISSUING A FINAL NEGATIVE REPORT   Report Status PENDING   Incomplete   CULTURE, BLOOD (ROUTINE X 2)     Status: Normal (Preliminary result)   Collection Time   11/21/11  5:59 PM      Component  Value Range Status Comment   Specimen Description BLOOD RIGHT HAND   Final    Special Requests BOTTLES DRAWN AEROBIC AND ANAEROBIC 10CC   Final    Culture  Setup Time [id number]   Final    Culture     Final    Value:        BLOOD CULTURE RECEIVED NO GROWTH TO DATE CULTURE WILL BE HELD FOR 5 DAYS BEFORE ISSUING A FINAL NEGATIVE REPORT   Report Status PENDING   Incomplete   MRSA PCR SCREENING     Status: Normal   Collection Time   11/21/11 11:07 PM      Component Value Range Status Comment   MRSA by PCR NEGATIVE  NEGATIVE  Final   FUNGUS CULTURE, BLOOD     Status: Normal (Preliminary result)   Collection Time   11/22/11  3:00 PM      Component Value Range Status Comment   Specimen Description BLOOD LEFT ARM   Final    Special Requests BOTTLES DRAWN AEROBIC AND ANAEROBIC 10CC   Final    Culture NO FUNGUS ISOLATED;CULTURE IN PROGRESS FOR 7 DAYS   Final    Report Status PENDING   Incomplete   CULTURE, BLOOD (ROUTINE X 2)     Status: Normal (Preliminary result)   Collection Time   11/22/11  3:06 PM      Component Value Range Status Comment   Specimen Description BLOOD LEFT ARM   Final    Special Requests BOTTLES DRAWN AEROBIC AND ANAEROBIC 10CC   Final    Culture  Setup Time [id number]   Final    Culture     Final    Value:        BLOOD CULTURE RECEIVED NO GROWTH TO DATE CULTURE WILL BE HELD FOR 5 DAYS BEFORE ISSUING A FINAL NEGATIVE REPORT   Report Status PENDING   Incomplete   AFB CULTURE, BLOOD     Status: Normal (Preliminary result)   Collection Time  11/22/11  4:29 PM      Component Value Range Status Comment   Specimen Description BLOOD LEFT HAND   Final    Special Requests BOTTLES DRAWN AEROBIC ONLY 5CC   Final    Culture     Final    Value: CULTURE WILL BE EXAMINED FOR 6 WEEKS BEFORE ISSUING A FINAL REPORT   Report Status PENDING   Incomplete   CULTURE, BLOOD (ROUTINE X 2)     Status: Normal (Preliminary result)   Collection Time   11/22/11  4:29 PM      Component Value  Range Status Comment   Specimen Description BLOOD LEFT HAND   Final    Special Requests     Final    Value: BOTTLES DRAWN AEROBIC AND ANAEROBIC 10CC AER, 5CC ANA   Culture  Setup Time [id number]   Final    Culture     Final    Value:        BLOOD CULTURE RECEIVED NO GROWTH TO DATE CULTURE WILL BE HELD FOR 5 DAYS BEFORE ISSUING A FINAL NEGATIVE REPORT   Report Status PENDING   Incomplete   FUNGUS CULTURE, BLOOD     Status: Normal (Preliminary result)   Collection Time   11/22/11  4:29 PM      Component Value Range Status Comment   Specimen Description BLOOD LEFT HAND   Final    Special Requests BOTTLES DRAWN AEROBIC AND ANAEROBIC 10CCAER 5CCANA   Final    Culture NO FUNGUS ISOLATED;CULTURE IN PROGRESS FOR 7 DAYS   Final    Report Status PENDING   Incomplete   FUNGUS CULTURE W SMEAR     Status: Normal (Preliminary result)   Collection Time   11/24/11  1:25 PM      Component Value Range Status Comment   Specimen Description TISSUE AXILLA LEFT   Final    Special Requests NONE   Final    Fungal Smear NO YEAST OR FUNGAL ELEMENTS SEEN   Final    Culture CULTURE IN PROGRESS FOR FOUR WEEKS   Final    Report Status PENDING   Incomplete   AFB CULTURE WITH SMEAR     Status: Normal (Preliminary result)   Collection Time   11/24/11  1:25 PM      Component Value Range Status Comment   Specimen Description TISSUE AXILLA LEFT   Final    Special Requests NONE   Final    ACID FAST SMEAR NO ACID FAST BACILLI SEEN   Final    Culture     Final    Value: CULTURE WILL BE EXAMINED FOR 6 WEEKS BEFORE ISSUING A FINAL REPORT   Report Status PENDING   Incomplete   TISSUE CULTURE     Status: Normal (Preliminary result)   Collection Time   11/24/11  1:25 PM      Component Value Range Status Comment   Specimen Description TISSUE AXILLA LEFT   Final    Special Requests NONE   Final    Gram Stain     Final    Value: RARE WBC PRESENT, PREDOMINANTLY MONONUCLEAR     NO SQUAMOUS EPITHELIAL CELLS SEEN     NO  ORGANISMS SEEN   Culture NO GROWTH 2 DAYS   Final    Report Status PENDING   Incomplete    Studies/Results: No results found. Medications: I have reviewed the patient's current medications. Scheduled Meds:    . amLODipine  10 mg Oral  Daily  . chlorpheniramine-HYDROcodone  5 mL Oral Q12H  . dapsone  100 mg Oral Daily  . efavirenz-emtrictabine-tenofovir  1 tablet Oral QHS  . enoxaparin  40 mg Subcutaneous Daily  . iron dextran (INFED/DEXFERRUM) 1000 MG IVPB  1,000 mg Intravenous Once  . iron dextran (INFED/DEXFERRRUM) 500 MG IVPB  500 mg Intravenous Once  . labetalol  200 mg Oral BID  . senna-docusate  2 tablet Oral QHS  . sodium chloride  3 mL Intravenous Q12H   Continuous Infusions:    . DISCONTD: dextrose 5 % and 0.45 % NaCl with KCl 20 mEq/L 75 mL/hr at 11/26/11 0233   PRN Meds:.acetaminophen, acetaminophen, acetaminophen, HYDROcodone-acetaminophen, HYDROmorphone, ondansetron (ZOFRAN) IV, ondansetron, promethazine, sodium chloride Assessment/Plan: - Fever and LAD: nightsweats overnight but afebrile. S/p LN biopsy 2/26. Differential includes: lymphoma, MAC, fungal infection, less likely IRIS (CD4 decreased to 110).   - Path consult came back: consistent with HIV lymphadenopathy. No lymphoma.  - Consider bone marrow biopsy given the hx of fever and nightsweats.  - Consider bartonella assay.  - F/u with blood culture.  - F/u with AFB culture.  - F/u with fungal culture.  - quantiferon assay - neg. - Anemia: acute, normocytic. Retic, bilirubin is normal. Pt complains of some dizziness when sitting up. Iron panel shows low iron, elevated ferritin, consistent with AoCD. Vitamin B12 is low at 131, consistent with increased metabolic requirement due to illness.  - repleted B12 2/27  - IV iron started 2/26. Consider stopping.  - Possible bone marrow biopsy.  - Possible hemonc consult, blood smear. - Splenomegaly: Hx of recent infection. No hx of past Mono infection. Pt afebrile, but  endoreses nightsweats and some cough. Denies sore throat, excessive fatigue. Differential includes lymphoma (infiltrative), HIV, Mono (inflammatory). - HIV: CD4 is 110, down from prior. Viral load is < 20.   - Dapsone 100mg  qd  - HIV regimen: efavirenz-emtrictabine-tenofovir 1 tab po qhs  - F/u viral load. - AKI on CKD: BUN 14 -> 9, Cr 1.44 -> 1.26. Improving.  - Continue to follow Cr. - DVT prophylaxis  - lovenox    LOS: 5 days   This is a Psychologist, occupational Note.  The care of the patient was discussed with Dr. Berlinda Last and the assessment and plan formulated with their assistance.  Please see their attached note for official documentation of the daily encounter.  Gayla Doss 11/26/2011, 2:04 PM * please see signed attending note on Dr. Mariel Aloe Daily progress note.

## 2011-11-26 NOTE — Progress Notes (Addendum)
Resident Co-sign Daily Note: I have seen the patient and reviewed the daily progress note by [facility] and discussed the care of the patient with them.  See below for documentation of my findings, assessment, and plans.  Subjective: Pt feels fine post bx.  Objective: Vital signs in last 24 hours: Filed Vitals:   11/25/11 2217 11/26/11 0603 11/26/11 1001 11/26/11 1350  BP: 122/59 109/58 134/57 125/58  Pulse: 89 83 87 82  Temp: 98.1 F (36.7 C) 98 F (36.7 C)  98.6 F (37 C)  TempSrc: Oral Oral  Oral  Resp: 18 18  16   Height:      Weight:  231 lb 7.7 oz (105 kg)    SpO2: 95% 97%  96%   Physical Exam: Gen: NAD, resting comfortably HEENT: NCAT, anicteric Skin: pale L axilla: dressing c/d/i  Lab Results: Reviewed and documented in Electronic Record Micro Results: Reviewed and documented in Electronic Record Studies/Results: Reviewed and documented in Electronic Record Medications: I have reviewed the patient's current medications. Scheduled Meds:   . amLODipine  10 mg Oral Daily  . chlorpheniramine-HYDROcodone  5 mL Oral Q12H  . dapsone  100 mg Oral Daily  . efavirenz-emtrictabine-tenofovir  1 tablet Oral QHS  . enoxaparin  40 mg Subcutaneous Daily  . iron dextran (INFED/DEXFERRUM) 1000 MG IVPB  1,000 mg Intravenous Once  . iron dextran (INFED/DEXFERRRUM) 500 MG IVPB  500 mg Intravenous Once  . labetalol  200 mg Oral BID  . senna-docusate  2 tablet Oral QHS  . sodium chloride  3 mL Intravenous Q12H    Assessment/Plan: # Fever and LAD  Some difficulty with proper storage technique of biopsy sample.  Nonetheless, after d/w Dr. Laureen Ochs of pathology, he says no lymphoma, c/w HIV d/z.  He will do a couple of stains and then finalize read tomorrow, but he did not think any additional cells would need to be obtained for flow.  Now question is whether to obtain bone marrow bx and if that would add additional diagnostic info.  Dr. Laureen Ochs also mentioned that he did not see any  granulomas. - f/u final pathology and bx cultures  - consider bone marrow biopsy - f/u blood cx  - f/u fungal cx  - f/u AFB cx  - f/u quantiferon--> neg  # Acute anemia, normocytic  Hb stable in 7's. Anemia panel shows component of ACD and B12 def.  - repleted B12  - repleted with IV, consider starting oral Fe  - repeat CBC  - Check FOBT   # HIV  CD4 returned at 110 today, viral load <20. Dapsone for ppx, will consider changing HIV regimen in future. Drop from 220 could be 2/2 acute infxn vs worsening disease.  - dapsone 100mg  qd  - con't atripla   # AKI on CKD  Resolved, around baseline of 1.3-1.4    # DVT prophylaxis  - SCD's   LOS: 5 days   Abner Greenspan, BEN 11/26/2011, 2:25 PM  Internal Medicine Teaching Service Attending Note Date: 11/26/2011  Patient name: Patrick [name]  Medical record number: [id number]  Date of birth: 08/10/1961    This patient has been seen and discussed with the house staff. Please see their note for complete details. I concur with their findings and plan as outlined by Dr. Abner Greenspan. tissue from excisional biopsy did not go directly to path and was accidentally stored in refrigerator thus compromising the specimen to do flow cytometry. Basic histology was done and await final read by  Dr. Laureen Ochs. Will decide if need further testing such as bone marrow aspirate, or repeat FNA, or scan to determine etiology of worsening progression of lymphadenopathy, nightsweats and fatigue. In part his fatigue, could be due to anemia (both iron def and vit b 12 def) which are currently being repleted. Await path results to determine if further procedures need to be done.  Duke Salvia Drue Second MD MPH [facility] [phone]   Judyann Munson 11/26/2011, 5:39 PM

## 2011-11-26 NOTE — Progress Notes (Signed)
Patient ID: Patrick [name], male   DOB: [birth year], 51 y.o.   MRN: [id number]  General Surgery - [facility] - Progress Note  POD# 2   Subjective: Patient without complaints.  No pain.  No drainage.  Objective: Vital signs in last 24 hours: Temp:  [98 F (36.7 C)-98.1 F (36.7 C)] 98 F (36.7 C) (02/28 0603) Pulse Rate:  [70-95] 83  (02/28 0603) Resp:  [16-18] 18  (02/28 0603) BP: (100-122)/(58-67) 109/58 mmHg (02/28 0603) SpO2:  [95 %-97 %] 97 % (02/28 0603) Weight:  [231 lb 7.7 oz (105 kg)] 231 lb 7.7 oz (105 kg) (02/28 0603) Last BM Date: 11/24/11  Intake/Output from previous day: 02/27 0701 - 02/28 0700 In: 1734 [I.V.:1684; IV Piggyback:50] Out: -   Exam: Ext - mild soft tissue swelling, probable small lymphocele; no drainage   Lab Results:   [facility] 11/25/11 0635 11/24/11 0627  WBC 3.0* 3.5*  HGB 7.0* 7.8*  HCT 21.9* 24.5*  PLT 160 151     Basename 11/25/11 0635 11/24/11 0627  NA 135 137  K 4.6 5.1  CL 104 106  CO2 23 24  GLUCOSE 175* 181*  BUN 14 16  CREATININE 1.44* 1.42*  CALCIUM 8.8 9.2    Studies/Results: No results found.  Assessment: Status post lymph node excision for cultures HIV lymphadenopathy  Plan: Monitor wound - may need aspiration if continues to enlarge or drain Follow up in [facility] office 2-3 weeks Culture results pending or preliminary - reviewed  Velora Heckler, MD, [facility] Office: [phone]  11/26/2011

## 2011-11-27 ENCOUNTER — Telehealth: Payer: Self-pay | Admitting: Internal Medicine

## 2011-11-27 LAB — TISSUE CULTURE

## 2011-11-27 LAB — CBC
Platelets: 220 10*3/uL (ref 150–400)
RBC: 2.77 MIL/uL — ABNORMAL LOW (ref 4.22–5.81)
WBC: 4.9 10*3/uL (ref 4.0–10.5)

## 2011-11-27 NOTE — Progress Notes (Signed)
Medical Student Daily Progress Note  Subjective: NAEON. 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 Pt continues to endorse nightsweats. Dizziness is improved. Objective: Vital signs in last 24 hours: Filed Vitals:   11/26/11 1350 11/26/11 2115 11/27/11 0522 11/27/11 1002  BP: 125/58 132/69 126/75 126/64  Pulse: 82 82 82 77  Temp: 98.6 F (37 C) 98.4 F (36.9 C) 97.5 F (36.4 C)   TempSrc: Oral     Resp: 16 18 16    Height:      Weight:   105 kg (231 lb 7.7 oz)   SpO2: 96% 97% 98%    Weight change: 0 kg (0 lb) No intake or output data in the 24 hours ending 11/27/11 1204 Physical Exam: Vitals reviewed. General: resting in bed, NAD Cardiac: RRR, systolic flow murmur heard, no rubs or gallops Pulm: clear to auscultation bilaterally, no wheezes, rales, or rhonchi Abd: soft, nontender, nondistended, BS present Ext: warm and well perfused, no pedal edema Lymphadenopathy felt in bilateral cervical, axillary and groin.  Lab Results: Basic Metabolic Panel:  Lab 11/26/11 1610 11/25/11 0635  Patrick [name] 135 135  K 4.8 4.6  CL 104 104  CO2 25 23  GLUCOSE 212* 175*  BUN 9 14  CREATININE 1.26 1.44*  CALCIUM 9.1 8.8  MG -- --  PHOS -- --   Liver Function Tests:  Lab 11/22/11 0610  AST 8  ALT 7  ALKPHOS 50  BILITOT 0.2*  PROT 7.5  ALBUMIN 2.5*   CBC:  Lab 11/27/11 0530 11/26/11 1009 11/21/11 1748  WBC 4.9 3.9* --  NEUTROABS -- -- 2.1  HGB 7.9* 7.6* --  HCT 25.0* 23.5* --  MCV 90.3 89.0 --  PLT 220 199 --   CBG:  Lab 11/24/11 1446  GLUCAP 153*   Anemia Panel:  Lab 11/24/11 0627  VITAMINB12 131*  FOLATE 5.7  FERRITIN 395*  TIBC Not calculated due to Iron <10.  IRON <10*  RETICCTPCT 0.8   Urine Drug Screen: Drugs of Abuse     Component Value Date/Time   LABOPIA NONE DETECTED 11/19/2010 0348   COCAINSCRNUR NONE DETECTED 11/19/2010 0348   LABBENZ NONE DETECTED 11/19/2010 0348   AMPHETMU NONE DETECTED 11/19/2010 0348     THCU NONE DETECTED 11/19/2010 0348   LABBARB  Value: NONE DETECTED        DRUG SCREEN FOR MEDICAL PURPOSES ONLY.  IF CONFIRMATION IS NEEDED FOR ANY PURPOSE, NOTIFY LAB WITHIN 5 DAYS.        LOWEST DETECTABLE LIMITS FOR URINE DRUG SCREEN Drug Class       Cutoff (ng/mL) Amphetamine      1000 Barbiturate      200 Benzodiazepine   200 Tricyclics       300 Opiates          300 Cocaine          300 THC              50 11/19/2010 0348    Alcohol Level: No results found for this basename: ETH:2 in the last 168 hours Urinalysis:  Lab 11/21/11 2246  COLORURINE YELLOW  LABSPEC 1.027  PHURINE 5.5  GLUCOSEU NEGATIVE  HGBUR TRACE*  BILIRUBINUR SMALL*  KETONESUR 15*  PROTEINUR 100*  UROBILINOGEN 0.2  NITRITE NEGATIVE  LEUKOCYTESUR NEGATIVE   Misc. Labs: none  Medications: I have reviewed the patient's current medications. Scheduled Meds:    . amLODipine  10 mg Oral Daily  . chlorpheniramine-HYDROcodone  5 mL Oral Q12H  . dapsone  100 mg Oral Daily  . efavirenz-emtrictabine-tenofovir  1 tablet Oral QHS  . enoxaparin  40 mg Subcutaneous Daily  . iron dextran (INFED/DEXFERRRUM) 500 MG IVPB  500 mg Intravenous Once  . labetalol  200 mg Oral BID  . senna-docusate  2 tablet Oral QHS  . sodium chloride  3 mL Intravenous Q12H  . white petrolatum       Continuous Infusions:   PRN Meds:.acetaminophen, acetaminophen, acetaminophen, HYDROcodone-acetaminophen, HYDROmorphone, ondansetron (ZOFRAN) IV, ondansetron, promethazine, sodium chloride Assessment/Plan: - Fever and LAD: nightsweats overnight but afebrile. S/p LN biopsy. Differential includes: MAC, fungal infection, Castleman syndrome less likely IRIS (CD4 decreased to 110), lymphoma (pathology reports no lymphoma).   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  - Consider HHV-8 test to r/o Castleman syndrome.  - F/u with blood culture.  - F/u with AFB culture.  - F/u with fungal culture. - Anemia: acute,  normocytic. Retic, bilirubin is normal. Hgb 7.6 -> 7.9. Dizziness is improved. Iron panel shows low iron, elevated ferritin, consistent with AoCD. Vitamin B12 is low at 131, consistent with increased metabolic requirement due to illness.  - repleted B12  - repleted IV iron.  - f/u FOBT.  - f/u antibody assay for IF to r/o pernicious anemia.  - Possible bone marrow biopsy.  - Possible hemonc consult, blood smear. - Splenomegaly: Hx of recent infection. No hx of past Mono infection. Pt afebrile, but endoreses nightsweats and some cough. Denies sore throat, excessive fatigue. Differential includes lymphoma (infiltrative), HIV, Mono (inflammatory). - HIV: CD4 is 110, down from prior. Viral load is < 20.   - Dapsone 100mg  qd  - HIV regimen: efavirenz-emtrictabine-tenofovir 1 tab po qhs  - F/u viral load. - AKI on CKD: stable, BUN 9, Cr 1.26. About his baseline.  - Continue to follow Cr. - DVT prophylaxis  - lovenox    LOS: 6 days   This is a Psychologist, occupational Note.  The care of the patient was discussed with Dr. Berlinda Last and the assessment and plan formulated with their assistance.  Please see their attached note for official documentation of the daily encounter.  Patrick [name] 11/27/2011, 12:04 PM  Resident Co-sign Daily Note: I have seen the patient and reviewed the daily progress note by Patrick Searles, MS 3 and discussed the care of the patient with them.  See below for documentation of my findings, assessment, and plans.  Subjective:  No c/o. No acute overnight event noted.  Objective: Vital signs in last 24 hours: Filed Vitals:   11/26/11 1350 11/26/11 2115 11/27/11 0522 11/27/11 1002  BP: 125/58 132/69 126/75 126/64  Pulse: 82 82 82 77  Temp: 98.6 F (37 C) 98.4 F (36.9 C) 97.5 F (36.4 C)   TempSrc: Oral     Resp: 16 18 16    Height:      Weight:   231 lb 7.7 oz (105 kg)   SpO2: 96% 97% 98%    Physical Exam: General: alert, NAD.   Lungs: CTA B/L Heart: normal rate, regular rhythm, no  murmur, no gallop, and no rub.  Abdomen: soft, non-tender, normal bowel sounds,  Msk: no joint swelling, no joint warmth, and no redness over joints.  Pulses: 1+ DP/PT pulses bilaterally Extremities: No cyanosis, clubbing, edema Neurologic: alert & oriented X3  Lab Results: Reviewed and documented in Electronic Record Micro Results: Reviewed and documented in Electronic Record Studies/Results: Reviewed and documented in Electronic Record Medications: I  have reviewed the patient's current medications. Scheduled Meds:   . amLODipine  10 mg Oral Daily  . chlorpheniramine-HYDROcodone  5 mL Oral Q12H  . dapsone  100 mg Oral Daily  . efavirenz-emtrictabine-tenofovir  1 tablet Oral QHS  . enoxaparin  40 mg Subcutaneous Daily  . iron dextran (INFED/DEXFERRRUM) 500 MG IVPB  500 mg Intravenous Once  . labetalol  200 mg Oral BID  . senna-docusate  2 tablet Oral QHS  . sodium chloride  3 mL Intravenous Q12H  . white petrolatum       Continuous Infusions:  PRN Meds:.acetaminophen, acetaminophen, acetaminophen, HYDROcodone-acetaminophen, HYDROmorphone, ondansetron (ZOFRAN) IV, ondansetron, promethazine, sodium chloride Assessment/Plan:  #Fever and LAD: nightsweats overnight but afebrile. S/p LN biopsy and negative for lymphoma. The etiology is likely HIV associated LN. 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  - F/u with blood culture.  - F/u with AFB culture.  - F/u with fungal culture.  # Anemia: acute, normocytic. Retic, bilirubin is normal. Hgb 7.6 -> 7.9. Dizziness is improved. Iron panel shows low iron, elevated ferritin, consistent with AoCD. Vitamin B12 is low at 131, consistent with increased metabolic requirement due to illness.  - repleted B12  - repleted IV iron.  - f/u FOBT.  - f/u antibody assay for IF to r/o pernicious  anemia.  -  We are not going to pursue bone marrow biopsy now given negative pathology reports for lymphoma. Patient will continue to follow up with ID fr further management.   .  # Splenomegaly: likely 2/2 HIV Hx of recent infection. No hx of past Mono infection. Pt afebrile, but endoreses nightsweats and some cough. Denies sore throat, excessive fatigue.   # HIV: CD4 is 110, down from prior. Viral load is < 20.   - Dapsone 100mg  qd  - HIV regimen: efavirenz-emtrictabine-tenofovir 1 tab po qhs  - F/u viral load.  # AKI on CKD: stable, BUN 9, Cr 1.26. About his baseline.  - Continue to follow Cr.  # DVT prophylaxis  - lovenox  Disposition: Likely D/C in am.   LOS: 6 days   Leaman Abe 11/27/2011, 3:15 PM

## 2011-11-28 LAB — CULTURE, BLOOD (ROUTINE X 2)
Culture  Setup Time: 201302240208
Culture  Setup Time: 201302242119
Culture  Setup Time: 201302242119
Culture: NO GROWTH
Culture: NO GROWTH

## 2011-11-28 LAB — INTRINSIC FACTOR ANTIBODIES: Intrinsic Factor: NEGATIVE

## 2011-11-28 LAB — CREATININE, SERUM
GFR calc Af Amer: 73 mL/min — ABNORMAL LOW (ref >90)
GFR calc non Af Amer: 63 mL/min — ABNORMAL LOW (ref >90)

## 2011-11-28 MED ORDER — EFAVIRENZ-EMTRICITAB-TENOFOVIR 600-200-300 MG PO TABS: 1.0000 | ORAL_TABLET | Freq: Every day | ORAL | Status: DC

## 2011-11-28 MED ORDER — HYDROCODONE-ACETAMINOPHEN 5-325 MG PO TABS: 1.0000 | ORAL_TABLET | ORAL | Status: DC | PRN

## 2011-11-28 MED ORDER — DAPSONE 100 MG PO TABS: 100.0000 mg | ORAL_TABLET | Freq: Every day | ORAL | Status: DC

## 2011-11-28 MED ORDER — AMLODIPINE BESYLATE 10 MG PO TABS: 10.0000 mg | ORAL_TABLET | Freq: Every day | ORAL | Status: DC

## 2011-11-28 NOTE — Progress Notes (Signed)
S: Pt feels at baseline and is dressed and ready to go home. Denies any N/V abd pain, diarrhea, CP, SOB overnight.  O: Vitals reviewed and stable. Labs reviewed and unchanged.  PE: General: resting in bed, NAD  Cardiac: RRR, systolic flow murmur heard, no rubs or gallops  Pulm: clear to auscultation bilaterally, no wheezes, rales, or rhonchi  Abd: soft, nontender, nondistended, BS present  Ext: warm and well perfused, no pedal edema  Lymphadenopathy felt in bilateral cervical, axillary and groin.  A&P: Pt is stable and will be discharged home today. Will be followed in [facility] clinic next week and ID clinic in 7-10 days. OPC will call him on Monday with appt. Will have a close f/u for his Anemia/VitB12 def, HIV and Lymphadenopathy. He needs daily Vit B12 1 mg im injections for a week, then once every week for 4 weeks and then once every month for possibly lifelong. Can consider switching to oral when enough repleted.

## 2011-11-28 NOTE — Progress Notes (Addendum)
nsl has been dc'd. Dc instructions gone over with patient. Follow up appointment has been made. Patient to follow up with internal medicine clinic for b12 shots. Prescription given.  Signs and symptoms of infection gone over. Diet and incisional l care gone over.. Patient to stop zithromycin. Verbalized understanding of all.

## 2011-11-29 LAB — FUNGUS CULTURE, BLOOD

## 2011-11-29 NOTE — Discharge Summary (Signed)
Internal Medicine Teaching [facility] Discharge Note  Name: Patrick [name] MRN: [id number] DOB: 05-12-1961 51 y.o.  Date of Admission: 11/21/2011  4:29 PM Date of Discharge: 11/29/2011 Attending Physician: No att. providers found  Discharge Diagnosis: Principal Problem:  *LYMPHADENOPATHY, DIFFUSE Active Problems:  HIV DISEASE  Anemia  B12 deficiency  HTN  Discharge Medications: Medication List  As of 11/29/2011  9:48 PM   STOP taking these medications         azithromycin 250 MG tablet         TAKE these medications         amLODipine 10 MG tablet   Commonly known as: NORVASC   Take 1 tablet (10 mg total) by mouth daily.      dapsone 100 MG tablet   Take 1 tablet (100 mg total) by mouth daily.      efavirenz-emtrictabine-tenofovir 600-200-300 MG per tablet   Commonly known as: ATRIPLA   Take 1 tablet by mouth at bedtime.      HYDROcodone-acetaminophen 5-325 MG per tablet   Commonly known as: NORCO   Take 1-2 tablets by mouth every 4 (four) hours as needed.      labetalol 200 MG tablet   Commonly known as: NORMODYNE   Take 200 mg by mouth 2 (two) times daily.            Disposition and follow-up:   Patrick [name] was discharged from [facility] in Stable condition.    Follow-up Appointments: Follow-up Information    Follow up with Velora Heckler, MD. (As needed)    Contact information:   [facility] [address] [place] [address] [phone]         Discharge Orders    Future Appointments: Provider: Department: Dept Phone: Center:   12/16/2011 11:30 AM Velora Heckler, MD [facility] [phone] None   01/05/2012 9:00 AM [facility] [phone] [facility]   01/19/2012 9:00 AM Cliffton Asters, MD [facility] [phone] [facility]     Future Orders Please Complete By Expires   Diet - low sodium heart healthy      Increase activity slowly      Call MD  for:  temperature >100.4      Call MD for:  persistant nausea and vomiting      Call MD for:  severe uncontrolled pain      Discharge instructions      Comments:   Call Clinic for any new changes. Will call you on Monday with appt day and time. You will need frequent Vitamin B12 shots for a while and discuss that during your upcoming clinic visit.      Consultations: Treatment Team:  Nelda Bucks, MD  Procedures Performed:  Dg Chest 2 View  11/21/2011  *RADIOLOGY REPORT*  Clinical Data: Fever.  Nausea.  Lymphadenopathy in the neck, groin, and axilla.  CHEST - 2 VIEW 11/21/2011:  Comparison: Two-view chest x-ray 11/17/2011, 10/02/2011, 12/25/2010, 11/28/2009 [facility].  Findings: Cardiac silhouette upper normal in size, unchanged. Hilar and mediastinal contours otherwise unremarkable.  Lungs clear.  Bronchovascular markings normal.  Pulmonary vascularity normal.  No pneumothorax.  No pleural effusions.  Mild eventration of the right anterior hemidiaphragm, unchanged.  Visualized bony thorax intact.  IMPRESSION: No acute cardiopulmonary disease.  Stable examination.  Original Report Authenticated By: Arnell Sieving, M.D.   Dg Chest 2 View  11/17/2011  *RADIOLOGY REPORT*  Clinical Data: Fever, flu like symptoms  CHEST - 2 VIEW  Comparison: 10/02/2011  Findings: Cardiomediastinal silhouette is stable.  No acute infiltrate or pleural effusion.  No pulmonary edema.  The bony thorax is stable.  IMPRESSION: No active disease.  No significant change.  Original Report Authenticated By: Natasha Mead, M.D.    Admission HPI: Patrick [name] is a 51 year old male with past medical history significant for HIV (CD4 240, viral load undetectable as of 10/06/2011) characterized by excellent viral suppression but poor immunologic response to ART. Over the past year, he has experienced persistent waxing and waning lymphadenopathy and fever.   He reports difficulties with sinus congestion and dry cough  over the past month and feels as though his fever and LAD have significantly worsened over the past week. He was seen at the [facility] on 11/17/2011 at which time he was prescribed a Z-Pak for treatment of possible bacterial sinusitis; a chest x-ray was also obtained at that time that was unrevealing for any acute or concerning process. The patient feels his symptoms have worsened despite empiric antibiotic treatment. He reports nightly febrile episodes with a Tmax of 102.7 at home. He admits to "cold sweats" during the day. He denies chest pain and shortness of breath but reports significantly increased fatigue. He reports a mild frontal headache as well as persistent sinus congestion. He denies visual changes, vision loss, syncope, dizziness, tingling, numbness, or weakness. He denies any sore throat, productive cough, and hemoptysis. He denies abdominal pain, nausea, vomiting, and diarrhea. He denies bright red blood per rectum, dark tarry stools, constipation, dysuria, hematuria, or urinary urgency. He has not noted any other new or associated symptoms.   He has not missed any doses of Atripla.   Hospital Course by problem list: 1. LYMPHADENOPATHY, DIFFUSE Patient presented with diffuse bilateral lymphadenopathy of the neck, axilla, and groin. He also had fevers to 102.7 at home. One year prior in 2012 he had a negative lymph node biopsy, but has had continued waxing and waning LAD and fevers since. The etiology of these was unclear, so the patient underwent repeat lymph node biopsy with multiple cultures and blood cultures. The lymph node biopsy was similar to prior and did not indicate any malignant or granulomatous disease. At present, AFB lymph node and blood cultures are still pending, as well as a number of other labs. The patient will followup in clinic for these results and continued workup of this problem.  2. HIV DISEASE Patient reported good compliance with his antiretroviral regimen, but had a  drop in CD4 count to 110, down from 220. This should be followed up in clinic.  3. Anemia Patient was found to be anemic with a hemoglobin in the 7's, down from a baseline of 10.  Patient was found to have B12 deficiency, and was repleted with IM B12. He will need weekly B12 4 weeks and then monthly B12. Intrinsic factor antibody was negative.   Discharge Vitals:  BP 139/72  Pulse 66  Temp(Src) 97.5 F (36.4 C) (Oral)  Resp 16  Ht 6' (1.829 m)  Wt 231 lb 7.7 oz (105 kg)  BMI 31.39 kg/m2  SpO2 99%  Discharge Labs:  No results found for this or any previous visit (from the past 24 hour(s)).  SignedAbner Greenspan, BEN 11/29/2011, 9:48 PM

## 2011-11-30 LAB — FUNGUS CULTURE, BLOOD

## 2011-11-30 LAB — GLUCOSE, CAPILLARY: Glucose-Capillary: 204 mg/dL — ABNORMAL HIGH (ref 70–99)

## 2011-11-30 NOTE — Telephone Encounter (Signed)
Called pt. He is aware that he have an appt with Dr. Orvan Falconer tomorrow at 11:15am.

## 2011-12-01 ENCOUNTER — Ambulatory Visit (INDEPENDENT_AMBULATORY_CARE_PROVIDER_SITE_OTHER): Payer: Medicaid Other | Admitting: Internal Medicine

## 2011-12-01 VITALS — BP 152/83 | HR 77 | Temp 98.4°F | Ht 72.0 in | Wt 228.0 lb

## 2011-12-01 DIAGNOSIS — B20 Human immunodeficiency virus [HIV] disease: Secondary | ICD-10-CM

## 2011-12-01 NOTE — Progress Notes (Signed)
Patient ID: Patrick [name], male   DOB: 1961/09/01, 51 y.o.   MRN: [id number]  INFECTIOUS DISEASE PROGRESS NOTE    Subjective: Patrick [name] is in for his hospital f/u visit. He had recurrent fever and generalized lymphadenopathy and was readmitted last week. His repeat LN bx showed benign, reactive changes again and his fevers and lymphadenopathy improved spontaneously. He is feeling better. He has not missed any of his Atripla. He has not taken his BP meds yet today.  Objective: Temp: 98.4 F (36.9 C) (03/05 1117) Temp src: Oral (03/05 1117) BP: 152/83 mmHg (03/05 1117) Pulse Rate: 77  (03/05 1117)  General: looks better Oral: clear Nodes: cervical and axillary nodes a little smaller, rubberry and non tender Skin: no rash Lungs: clear Cor: reg S1 and S2 with no murmurs Abdomen: soft and non tender   Lab Results HIV 1 RNA Quant (copies/mL)  Date Value  11/22/2011 <20   10/06/2011 <20   10/02/2011 40*     CD4 T Cell Abs (cmm)  Date Value  11/22/2011 110*  10/06/2011 220*  10/02/2011 170*     Assessment: There is still no clear explanation for his recurrent fevers and adenopathy. 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 His CD4 did drop to 110 but his VL remains supressed.  Plan: 1. Continue current meds 2. Repeat labs in 6 weeks 3. Check final LN cultures and Bartonella serologies   Cliffton Asters, MD [facility] [phone] pager   [phone] cell 12/01/2011, 12:15 PM

## 2011-12-02 LAB — BARTONELLA ANTIBODY PANEL: B henselae IgM: NEGATIVE

## 2011-12-03 ENCOUNTER — Ambulatory Visit (INDEPENDENT_AMBULATORY_CARE_PROVIDER_SITE_OTHER): Payer: Medicaid Other | Admitting: Internal Medicine

## 2011-12-03 ENCOUNTER — Encounter: Payer: Self-pay | Admitting: Internal Medicine

## 2011-12-03 ENCOUNTER — Encounter (INDEPENDENT_AMBULATORY_CARE_PROVIDER_SITE_OTHER): Payer: Self-pay | Admitting: Surgery

## 2011-12-03 DIAGNOSIS — E538 Deficiency of other specified B group vitamins: Secondary | ICD-10-CM | POA: Insufficient documentation

## 2011-12-03 DIAGNOSIS — E291 Testicular hypofunction: Secondary | ICD-10-CM

## 2011-12-03 DIAGNOSIS — D518 Other vitamin B12 deficiency anemias: Secondary | ICD-10-CM

## 2011-12-03 DIAGNOSIS — B20 Human immunodeficiency virus [HIV] disease: Secondary | ICD-10-CM

## 2011-12-03 DIAGNOSIS — I10 Essential (primary) hypertension: Secondary | ICD-10-CM

## 2011-12-03 DIAGNOSIS — R599 Enlarged lymph nodes, unspecified: Secondary | ICD-10-CM

## 2011-12-03 DIAGNOSIS — D519 Vitamin B12 deficiency anemia, unspecified: Secondary | ICD-10-CM

## 2011-12-03 DIAGNOSIS — N189 Chronic kidney disease, unspecified: Secondary | ICD-10-CM

## 2011-12-03 LAB — BASIC METABOLIC PANEL WITH GFR
BUN: 16 mg/dL (ref 6–23)
CO2: 27 mEq/L (ref 19–32)
Glucose, Bld: 127 mg/dL — ABNORMAL HIGH (ref 70–99)
Potassium: 4.9 mEq/L (ref 3.5–5.3)
Sodium: 137 mEq/L (ref 135–145)

## 2011-12-03 MED ORDER — TESTOSTERONE 20.25 MG/1.25GM (1.62%) TD GEL: 50.0000 mg | Freq: Every morning | TRANSDERMAL | Status: DC

## 2011-12-03 MED ORDER — CYANOCOBALAMIN 1000 MCG/ML IJ SOLN: INTRAMUSCULAR | Status: DC

## 2011-12-03 MED ORDER — TESTOSTERONE 20.25 MG/1.25GM (1.62%) TD GEL: 100.0000 mg | Freq: Every morning | TRANSDERMAL | Status: DC

## 2011-12-03 NOTE — Progress Notes (Signed)
History of present illness: [name] is a 51 yo M with PMH of HIV, wax and wane lymphadenopathy & fever, HTN, hypogonadism presents today for hospital follow up.  Patient was recently admitted in 10/2011 for recurrent lymphadenopathy and fever which is likely secondary to IRIS and that his CT scan as well as biopsy revealed benign appearance.  His Hb also dropped from 10-11 to 7.9 and was found to have B12 deficiency and elevated ferritin of 395.  His CD4 also dropped from 220 to 110.   Since hospital discharge, he feels much better and that the fever has resolved; although he still has mild sweating at night. His diffused lymphadenopathy also decreased in size as well. He was just recently seen by Dr. Orvan Falconer a few days ago.  He states that he has been using AndroGel for his low testosterone without significant difference.   He reports numbness and tingling in his feet in the past 1 year. No other complaints today   Review of system: As per history of present illness.  ++night sweat but no fever, N/V, abdominal pain, chest pain or SOB  Physical examination: General: alert, well-developed, and cooperative to examination.  Mouth: pharynx pink and moist, no erythema, and no exudates.  Neck: supple, full ROM, no thyromegaly, no JVD, and no carotid bruits.  Lungs: normal respiratory effort, no accessory muscle use, normal breath sounds, no crackles, and no wheezes. Heart: normal rate, regular rhythm, no murmur, no gallop, and no rub.  Abdomen: soft, non-tender, normal bowel sounds, no distention, no guarding, no rebound tenderness, splenomegaly Msk: no joint swelling, no joint warmth, and no redness over joints.  Pulses: 2+ DP/PT pulses bilaterally Extremities: No cyanosis, clubbing, edema Lymph: Diffused lymphadenopathy on inguinal, axillary- 7cm incision with sterile tape with mild induration and erythema and tenderness but no discharge noted.  +cervical lymphadenopathy- 2 bilaterally about 2cm in  size without any tenderness. Neurologic: alert & oriented X3, cranial nerves II-XII intact, strength normal in all extremities, sensation intact to light touch, and gait normal.  Psych: Oriented X3, memory intact for recent and remote, normally interactive, good eye contact, ++ anxious appearing, and not depressed appearing.

## 2011-12-03 NOTE — Assessment & Plan Note (Addendum)
B12 level of 131 with Hb of 7.9.  He does report numbness and tingling in the past 1 year.  Ddx include pernicious anemia which is ruled out as his intrinsic factor is negative.  Other ddx include malabsorption, diet, or pancreatic insufficiency.    -Will give B12 injection in a dose of 1000 micrograms (1000 mcg, 1 mg) every day for one week, followed by 1 mg every week for four weeks and then monthly. -Consider check MMA and HC -repeat CBC in 3 months -Patient would like to self-inject so Rx was given to patient

## 2011-12-03 NOTE — Patient Instructions (Signed)
You will receive B12 injection weekly x4 weeks then monthly Will check lab work today and I will call you with any abnormal lab results Followup with your PCP in 3 months

## 2011-12-03 NOTE — Assessment & Plan Note (Addendum)
Low testosterone 103 on 11/17/11.  He has been taking Androgel 10 g daily but still reports not seeing a big difference.  Hemachromatosis could be a cause of his low testosterone since his ferritin is elevated at 395.   -consider checking transferrin saturation  -Will continue androgel -Repeat testosterone level today

## 2011-12-03 NOTE — Assessment & Plan Note (Signed)
Improving.  Likely 2/2 IRIS.  CT scan of abd on 10/2010 showed stable bilateral inguinal, iliac, and retroperitoneal lymphadenopathy and stable splenomegaly.  He also had axillary lymph node bx on 2/26 during last hospitalization which showed no growth to date.   -Will continue to monitor

## 2011-12-03 NOTE — Assessment & Plan Note (Signed)
Well-controlled. Will continue norvasc and labetalol

## 2011-12-03 NOTE — Assessment & Plan Note (Signed)
CD4 has recently dropped from 220 to 110 but VL remains suppressed.  With his waxing and waning lymphadenopathy & fever, it is likely IRIS.  His symptoms are significantly improved although he still has diffused lymphadenopathy.   -Will continue Atripla -Repeat CD4 prior to seeing Dr. Orvan Falconer again next office visit and I am hopeful that his CD4 count will trend back up since he reports compliant with medications.

## 2011-12-04 LAB — CBC
HCT: 33.4 % — ABNORMAL LOW (ref 39.0–52.0)
Hemoglobin: 9.8 g/dL — ABNORMAL LOW (ref 13.0–17.0)
RBC: 3.48 MIL/uL — ABNORMAL LOW (ref 4.22–5.81)

## 2011-12-04 MED ORDER — CYANOCOBALAMIN 1000 MCG/ML IJ SOLN
1000.0000 ug | Freq: Once | INTRAMUSCULAR | Status: AC
  Administered 2011-12-03: 1000 ug via INTRAMUSCULAR

## 2011-12-04 NOTE — Progress Notes (Signed)
Addended by: Maura Crandall on: 12/04/2011 05:04 PM   Modules accepted: Orders

## 2011-12-07 ENCOUNTER — Ambulatory Visit (INDEPENDENT_AMBULATORY_CARE_PROVIDER_SITE_OTHER): Payer: Medicaid Other | Admitting: *Deleted

## 2011-12-07 DIAGNOSIS — D518 Other vitamin B12 deficiency anemias: Secondary | ICD-10-CM

## 2011-12-07 DIAGNOSIS — D519 Vitamin B12 deficiency anemia, unspecified: Secondary | ICD-10-CM

## 2011-12-07 MED ORDER — CYANOCOBALAMIN 1000 MCG/ML IJ SOLN: INTRAMUSCULAR | Status: DC

## 2011-12-07 MED ORDER — CYANOCOBALAMIN 1000 MCG/ML IJ SOLN
1000.0000 ug | Freq: Once | INTRAMUSCULAR | Status: AC
  Administered 2011-12-07: 1000 ug via INTRAMUSCULAR

## 2011-12-07 NOTE — Progress Notes (Signed)
Addended by: Carrolyn Meiers T on: 12/07/2011 10:11 AM   Modules accepted: Orders

## 2011-12-07 NOTE — Progress Notes (Signed)
Pt instructed on how to get himself Vitamin B12 injections.  Pt gave himself the injection; used good technique. He will be using multi -vial from the pharmacy; 1ml- he is awared. He knows, if needed, he can make another appt to go over the technique again. Stated he feels comfortable and not further instructions required  At this time. Also med will be available at [facility] on [address] [place].

## 2011-12-10 ENCOUNTER — Ambulatory Visit: Payer: Medicaid Other

## 2011-12-13 ENCOUNTER — Encounter (HOSPITAL_COMMUNITY): Payer: Self-pay | Admitting: *Deleted

## 2011-12-13 ENCOUNTER — Inpatient Hospital Stay (HOSPITAL_COMMUNITY)
Admission: EM | Admit: 2011-12-13 | Discharge: 2011-12-15 | DRG: 862 | Disposition: A | Discharge status: Home / Self Care | Payer: Medicaid Other | Attending: Internal Medicine | Admitting: Internal Medicine

## 2011-12-13 DIAGNOSIS — B20 Human immunodeficiency virus [HIV] disease: Secondary | ICD-10-CM

## 2011-12-13 DIAGNOSIS — R197 Diarrhea, unspecified: Secondary | ICD-10-CM | POA: Diagnosis present

## 2011-12-13 DIAGNOSIS — D649 Anemia, unspecified: Secondary | ICD-10-CM

## 2011-12-13 DIAGNOSIS — E291 Testicular hypofunction: Secondary | ICD-10-CM | POA: Diagnosis present

## 2011-12-13 DIAGNOSIS — Y836 Removal of other organ (partial) (total) as the cause of abnormal reaction of the patient, or of later complication, without mention of misadventure at the time of the procedure: Secondary | ICD-10-CM | POA: Diagnosis present

## 2011-12-13 DIAGNOSIS — T8140XA Infection following a procedure, unspecified, initial encounter: Principal | ICD-10-CM | POA: Diagnosis present

## 2011-12-13 DIAGNOSIS — D518 Other vitamin B12 deficiency anemias: Secondary | ICD-10-CM | POA: Diagnosis present

## 2011-12-13 DIAGNOSIS — L0390 Cellulitis, unspecified: Secondary | ICD-10-CM

## 2011-12-13 DIAGNOSIS — I10 Essential (primary) hypertension: Secondary | ICD-10-CM | POA: Diagnosis present

## 2011-12-13 DIAGNOSIS — L02219 Cutaneous abscess of trunk, unspecified: Secondary | ICD-10-CM | POA: Diagnosis present

## 2011-12-13 LAB — DIFFERENTIAL
Eosinophils Relative: 2 % (ref 0–5)
Lymphocytes Relative: 38 % (ref 12–46)
Lymphs Abs: 3 10*3/uL (ref 0.7–4.0)
Monocytes Absolute: 0.7 10*3/uL (ref 0.1–1.0)
Monocytes Relative: 9 % (ref 3–12)

## 2011-12-13 LAB — BASIC METABOLIC PANEL
BUN: 24 mg/dL — ABNORMAL HIGH (ref 6–23)
CO2: 20 mEq/L (ref 19–32)
Calcium: 9.1 mg/dL (ref 8.4–10.5)
Creatinine, Ser: 1.19 mg/dL (ref 0.50–1.35)
Glucose, Bld: 170 mg/dL — ABNORMAL HIGH (ref 70–99)
Sodium: 134 mEq/L — ABNORMAL LOW (ref 135–145)

## 2011-12-13 LAB — CBC
HCT: 31.9 % — ABNORMAL LOW (ref 39.0–52.0)
Hemoglobin: 10.7 g/dL — ABNORMAL LOW (ref 13.0–17.0)
MCH: 29.7 pg (ref 26.0–34.0)
MCV: 88.6 fL (ref 78.0–100.0)
RBC: 3.6 MIL/uL — ABNORMAL LOW (ref 4.22–5.81)
WBC: 7.8 10*3/uL (ref 4.0–10.5)

## 2011-12-13 MED ORDER — CYANOCOBALAMIN 1000 MCG/ML IJ SOLN: 1000.0000 ug | Freq: Every day | INTRAMUSCULAR | Status: DC

## 2011-12-13 MED ORDER — ONDANSETRON HCL 4 MG PO TABS: 4.0000 mg | ORAL_TABLET | Freq: Four times a day (QID) | ORAL | Status: DC | PRN

## 2011-12-13 MED ORDER — VANCOMYCIN HCL IN DEXTROSE 1-5 GM/200ML-% IV SOLN
1000.0000 mg | Freq: Once | INTRAVENOUS | Status: AC
  Administered 2011-12-13: 1000 mg via INTRAVENOUS
  Filled 2011-12-13: qty 200

## 2011-12-13 MED ORDER — VANCOMYCIN HCL IN DEXTROSE 1-5 GM/200ML-% IV SOLN
1000.0000 mg | Freq: Two times a day (BID) | INTRAVENOUS | Status: DC
  Administered 2011-12-13 – 2011-12-15 (×4): 1000 mg via INTRAVENOUS
  Filled 2011-12-13 (×6): qty 200

## 2011-12-13 MED ORDER — TESTOSTERONE 50 MG/5GM (1%) TD GEL
10.0000 g | Freq: Every day | TRANSDERMAL | Status: DC
  Filled 2011-12-13 (×3): qty 5

## 2011-12-13 MED ORDER — SODIUM CHLORIDE 0.9 % IV SOLN
INTRAVENOUS | Status: DC
  Administered 2011-12-13 – 2011-12-14 (×2): via INTRAVENOUS

## 2011-12-13 MED ORDER — ACETAMINOPHEN 325 MG PO TABS
650.0000 mg | ORAL_TABLET | Freq: Four times a day (QID) | ORAL | Status: DC | PRN
  Administered 2011-12-13 – 2011-12-15 (×2): 650 mg via ORAL
  Filled 2011-12-13 (×2): qty 2

## 2011-12-13 MED ORDER — ACETAMINOPHEN 650 MG RE SUPP: 650.0000 mg | Freq: Four times a day (QID) | RECTAL | Status: DC | PRN

## 2011-12-13 MED ORDER — CYANOCOBALAMIN 1000 MCG/ML IJ SOLN: 1000.0000 ug | INTRAMUSCULAR | Status: DC

## 2011-12-13 MED ORDER — EFAVIRENZ-EMTRICITAB-TENOFOVIR 600-200-300 MG PO TABS
1.0000 | ORAL_TABLET | Freq: Every day | ORAL | Status: DC
  Administered 2011-12-13 – 2011-12-15 (×2): 1 via ORAL
  Filled 2011-12-13 (×4): qty 1

## 2011-12-13 MED ORDER — CYANOCOBALAMIN 1000 MCG/ML IJ SOLN
1000.0000 ug | Freq: Every day | INTRAMUSCULAR | Status: AC
  Administered 2011-12-13 – 2011-12-15 (×3): 1000 ug via INTRAMUSCULAR
  Filled 2011-12-13 (×3): qty 1

## 2011-12-13 MED ORDER — AMLODIPINE BESYLATE 10 MG PO TABS
10.0000 mg | ORAL_TABLET | Freq: Every day | ORAL | Status: DC
  Administered 2011-12-13 – 2011-12-15 (×3): 10 mg via ORAL
  Filled 2011-12-13 (×3): qty 1

## 2011-12-13 MED ORDER — LABETALOL HCL 200 MG PO TABS
200.0000 mg | ORAL_TABLET | Freq: Two times a day (BID) | ORAL | Status: DC
  Administered 2011-12-13 – 2011-12-15 (×5): 200 mg via ORAL
  Filled 2011-12-13 (×6): qty 1

## 2011-12-13 MED ORDER — ONDANSETRON HCL 4 MG/2ML IJ SOLN: 4.0000 mg | Freq: Four times a day (QID) | INTRAMUSCULAR | Status: DC | PRN

## 2011-12-13 MED ORDER — TESTOSTERONE 20.25 MG/1.25GM (1.62%) TD GEL: 100.0000 mg | Freq: Every morning | TRANSDERMAL | Status: DC

## 2011-12-13 MED ORDER — ENOXAPARIN SODIUM 40 MG/0.4ML ~~LOC~~ SOLN
40.0000 mg | SUBCUTANEOUS | Status: DC
  Administered 2011-12-13 – 2011-12-15 (×3): 40 mg via SUBCUTANEOUS
  Filled 2011-12-13 (×3): qty 0.4

## 2011-12-13 MED ORDER — DAPSONE 100 MG PO TABS
100.0000 mg | ORAL_TABLET | Freq: Every day | ORAL | Status: DC
  Administered 2011-12-13 – 2011-12-15 (×3): 100 mg via ORAL
  Filled 2011-12-13 (×3): qty 1

## 2011-12-13 NOTE — Progress Notes (Signed)
  Subjective: Patient underwent L axillary LN biopsy by Dr. Gerrit Friends 2/26.  Patient was admitted for cellulitis of the surgical site.  Objective: Vital signs in last 24 hours: Temp:  [97.9 F (36.6 C)-98.8 F (37.1 C)] 98.8 F (37.1 C) (03/17 1540) Pulse Rate:  [68-93] 68  (03/17 1540) Resp:  [16-18] 18  (03/17 1540) BP: (112-135)/(65-81) 128/68 mmHg (03/17 1540) SpO2:  [96 %-99 %] 98 % (03/17 1540) Weight:  [97.523 kg (215 lb)] 97.523 kg (215 lb) (03/17 0950) Last BM Date: 12/12/11  Intake/Output from previous day:   Intake/Output this shift: Total I/O In: 240 [P.O.:240] Out: -   L axillary incision is healing well but there is an area of cellulitis extending inferiorly for about 8cm.  There is no drainage and no fluctuance.  The area is mildly tender.  A mark has been made marking the extent of the cellulitis.  Lab Results:   [facility] 12/13/11 0510  WBC 7.8  HGB 10.7*  HCT 31.9*  PLT 163   BMET  Basename 12/13/11 0510  NA 134*  K 4.1  CL 104  CO2 20  GLUCOSE 170*  BUN 24*  CREATININE 1.19  CALCIUM 9.1   PT/INR No results found for this basename: LABPROT:2,INR:2 in the last 72 hours ABG No results found for this basename: PHART:2,PCO2:2,PO2:2,HCO3:2 in the last 72 hours  Studies/Results: No results found.  Anti-infectives: Anti-infectives     Start     Dose/Rate Route Frequency Ordered Stop   12/13/11 2200  efavirenz-emtrictabine-tenofovir (ATRIPLA) 600-200-300 MG per tablet 1 tablet       1 tablet Oral Daily at bedtime 12/13/11 0946     12/13/11 1800   vancomycin (VANCOCIN) IVPB 1000 mg/200 mL premix        1,000 mg 200 mL/hr over 60 Minutes Intravenous Every 12 hours 12/13/11 0910     12/13/11 1200   dapsone tablet 100 mg        100 mg Oral Daily 12/13/11 0946     12/13/11 0630   vancomycin (VANCOCIN) IVPB 1000 mg/200 mL premix        1,000 mg 200 mL/hr over 60 Minutes Intravenous  Once 12/13/11 0620 12/13/11 0759           Assessment/Plan: Cellulitis S/P L axillary lymph node biopsy - agree with IV ABX.  I will D/W Dr. Gerrit Friends and our acute care team.  Will follow closely with you.   LOS: 0 days    Oreta Soloway E 12/13/2011

## 2011-12-13 NOTE — ED Provider Notes (Signed)
Medical screening examination/treatment/procedure(s) were conducted as a shared visit with non-physician practitioner(s) and myself.  I personally evaluated the patient during the encounter  Cellulitis left chest   Agree with plan to admit  Harshal Sirmon K Hosea Hanawalt-Rasch, MD 12/13/11 [phone]

## 2011-12-13 NOTE — ED Notes (Signed)
Pt reports having lymph node biopsy to left axilla last week - pt states yesterday he began noting pain to incision site, pain was progressively worse - pt states when he got home from work he noted the area to be red and swollen as well. Pt denies any fever or chills at present, in no acute distress, pleasant and A&Ox4.

## 2011-12-13 NOTE — H&P (Addendum)
Hospital Admission Note Date: 12/13/2011  Patient name: Patrick [name] Medical record number: [id number] Date of birth: 1961/01/30 Age: 51 y.o. Gender: male PCP: Janalyn Harder, MD, MD  Medical Service:    Mervyn Gay  Attending physician:   Dr. Doneen Poisson  1st Contact:    Dr. Yaakov Guthrie Pager:  [phone] 2nd Contact:    Dr. Allena Katz Pager:  [phone] After 5 pm or weekends: 1st Contact:      Pager: [phone] 2nd Contact:      Pager: [phone]  Chief Complaint: Erythema and tenderness of Biopsy site L axilla  History of Present Illness: Patrick [name] is a 51 year old man who was recently admitted to our surgery for lympadenopathy associated with fever and underwent diagnostic biopsy by surgery team that has not shown etiology as of yet, HIV, and HTN who presents due to erythema and irritation/tenderness of the lymph node biopsy site in L axilla for the past two days.  No fevers or chills.  He has been abnormally sweaty/clammy today.  His lymphadenopathy is now resolved.    He also report four days of diarrhea this week. Watery and multiple times per hour at its worst.  Has been resolved since yesterday morning.  He has been trying to drink a lot of water as a result, and he has been urinating a lot.  No CP, SOB, abdominal pain, numbness/tingling.  No dysuria or hematuria.  No nausea or vomiting.  No rashes.      Meds: Medication List  As of 12/13/2011  7:49 AM   ASK your doctor about these medications         amLODipine 10 MG tablet   Commonly known as: NORVASC   Take 1 tablet (10 mg total) by mouth daily.      cyanocobalamin 1000 MCG/ML injection   Commonly known as: (VITAMIN B-12)   Inject 1 mL daily x5 days ,then Inject 1ml weekly x 4 weeks, then monthly      dapsone 100 MG tablet   Take 1 tablet (100 mg total) by mouth daily.      efavirenz-emtrictabine-tenofovir 600-200-300 MG per tablet   Commonly known as: ATRIPLA   Take 1 tablet by mouth at bedtime.      labetalol 200  MG tablet   Commonly known as: NORMODYNE   Take 200 mg by mouth 2 (two) times daily.      Testosterone 20.25 MG/1.25GM (1.62%) Gel   Place 100 mg onto the skin every morning.            Allergies: Codeine and Sulfonamide derivatives Past Medical History  Diagnosis Date  . Hypertension   . Renal insufficiency     hydrated cr 1.5 gfr 48  . HIV (human immunodeficiency virus infection)   . Hypogonadism male    Past Surgical History  Procedure Date  . Lithotripsy 2010  . Appendectomy 2001    via midline incision  . Lymph node biopsy 2010, 2011    bilateral... by dr Derrell Lolling   Family History  Problem Relation Age of Onset  . Adopted: Yes   History   Social History  . Marital Status: Single    Spouse Name: N/A    Number of Children: N/A  . Years of Education: N/A   Occupational History  . Not on file.   Social History Main Topics  . Smoking status: Never Smoker   . Smokeless tobacco: Never Used  . Alcohol Use: 0.5 oz/week    1 drink(s)  per week     occasionallt  . Drug Use: No  . Sexually Active: Yes -- Male partner(s)    Birth Control/ Protection: Condom     refused condoms   Other Topics Concern  . Not on file   Social History Narrative  . No narrative on file    Review of Systems: See HPI   Physical Exam: Blood pressure 135/76, pulse 82, temperature 97.9 F (36.6 C), temperature source Oral, resp. rate 16, SpO2 96.00%.  General: alert, well-developed, and cooperative to examination.  Head: normocephalic and atraumatic.  Eyes: vision grossly intact, pupils equal, pupils round, pupils reactive to light, no injection and anicteric.  Mouth: pharynx pink and moist, no erythema, and no exudates.  Neck: no JVD Lungs: normal respiratory effort, no accessory muscle use, normal breath sounds, no crackles, and no wheezes. Heart: normal rate, regular rhythm, no murmur, no gallop, and no rub.  Abdomen: Mildly tender diffusely in RUQ, LUQ, RLQ.  Non-tender  in LLQ. Soft, normal bowel sounds, no distention, no guarding, no rebound tenderness  Pulses: 2+ DP/PT pulses bilaterally Extremities: No cyanosis, clubbing, edema Neurologic: alert & oriented X3, cranial nerves II-XII intact, strength normal in all extremities  Skin: Healing 3-4 inch incision L axilla.  Significant surrounding erythema with mild induration.  No definite fluctuance palpable.  No drainage.  Mild tenderness.   Lab results: Basic Metabolic Panel:  Basename 12/13/11 0510  NA 134*  K 4.1  CL 104  CO2 20  GLUCOSE 170*  BUN 24*  CREATININE 1.19  CALCIUM 9.1  MG --  PHOS --   CBC:  Basename 12/13/11 0510  WBC 7.8  NEUTROABS 3.9  HGB 10.7*  HCT 31.9*  MCV 88.6  PLT 163   Urine Drug Screen: Drugs of Abuse     Component Value Date/Time   LABOPIA NONE DETECTED 11/19/2010 0348   COCAINSCRNUR NONE DETECTED 11/19/2010 0348   LABBENZ NONE DETECTED 11/19/2010 0348   AMPHETMU NONE DETECTED 11/19/2010 0348   THCU NONE DETECTED 11/19/2010 0348   LABBARB  Value: NONE DETECTED        DRUG SCREEN FOR MEDICAL PURPOSES ONLY.  IF CONFIRMATION IS NEEDED FOR ANY PURPOSE, NOTIFY LAB WITHIN 5 DAYS.        LOWEST DETECTABLE LIMITS FOR URINE DRUG SCREEN Drug Class       Cutoff (ng/mL) Amphetamine      1000 Barbiturate      200 Benzodiazepine   200 Tricyclics       300 Opiates          300 Cocaine          300 THC              50 11/19/2010 0348     Assessment & Plan by Problem:   Cellulitis of biopsy site: Has had tenderness and erythema for 2 days surrounding biopsy site from lymph node biopsy done 1 1/2 weeks ago.  Seems to be an early cellulitis of the area.  Bartonella antibodies negative, AFB cultures from biopsy negative so far, tissue culture from biopsy negative so far, fungal cultures from biopsy negative so far. Will admit for antibiotic therapy and to consult surgeons who performed biopsy to come see him.  -Admit to regular floor -Vancomycin -Surgery consult -repeat CBC in  the morning -Tylenol for pain control  Diarrhea: Had four days of water diarrhea, multiple times per hour at its worst.  Resolving at this point, so will only check C  Diff for now, hold off on further workup at this point unless diarrhea recurs.  Only antibiotic therapy recently is his prophlactic dapsone.  He was recently hospitalized, making C Diff a risk, but he received no additional abx during that hospitalization. -Cdiff PCR -Cdiff contact precautions for now -Stool culture already ordered by ED team -IVF NS@75  cc/hr  Anemia: patient's Hgb has rebounded to 10.7, was as low as 7.0 last admisison. MCV 89.  B12 was 131 last admission. Intrinsic factor negative.  Ferrtin was 395 last admission.  Has received 3 days of B12 supplementation so far. -Continue B12 supplementation daily for 4 more days, then weekly for a month, then monthly  HIV: will continue Atripla.  CD4 110 last month.  VL <20 last month.  Continue dapsone for PCP prophylaxis.    DVT prophylaxis: lovenox    SignedYaakov Guthrie, [name] 12/13/2011, 7:48 AM

## 2011-12-13 NOTE — ED Notes (Signed)
Pt says he had a lymph node biopsy from under left arm on Saturday. Now having increasing pain and redness around the area. Also, starting on Tuesday, has had multiple episodes of diarrhea each day. No n/v

## 2011-12-13 NOTE — ED Provider Notes (Signed)
History     CSN: [id number]  Arrival date & time 12/13/11  0048   First MD Initiated Contact with Patient 12/13/11 [phone]      Chief complaint: skin infection  HPI  History provided by the patient. Patient is a 51 year old male with significant history of HIV, renal insufficiency and hypertension who presents with concerns for skin infection under left arm. Patient reports that his last CD4 count was around 100. Patient had a recent admission for an illness and "swollen lymph nodes". Patient reports that he underwent a biopsy of lymph nodes in his left axilla area. 2 days ago he noticed increasing redness and some tenderness to the area of an incision biopsy. Patient also reports having history of 2-3 days of diarrhea prior to concerns for her skin infection. Patient reports multiple episodes every hour of loose watery stool. Patient is not had any nausea or vomiting and is tolerating by mouth fluids. Patient states he's been trying to drink regularly to stay hydrated. He denies any fever, chills, sweats.   Past Medical History  Diagnosis Date  . Hypertension   . Renal insufficiency     hydrated cr 1.5 gfr 48  . HIV (human immunodeficiency virus infection)   . Hypogonadism male     Past Surgical History  Procedure Date  . Lithotripsy 2010  . Appendectomy 2001    via midline incision  . Lymph node biopsy 2010, 2011    bilateral... by dr Derrell Lolling    Family History  Problem Relation Age of Onset  . Adopted: Yes    History  Substance Use Topics  . Smoking status: Never Smoker   . Smokeless tobacco: Never Used  . Alcohol Use: 0.5 oz/week    1 drink(s) per week     occasionallt      Review of Systems  Constitutional: Negative for fever and chills.  Respiratory: Negative for cough and shortness of breath.   Cardiovascular: Negative for chest pain.  Gastrointestinal: Positive for diarrhea. Negative for nausea, vomiting and abdominal pain.  Skin: Positive for rash.  All  other systems reviewed and are negative.    Allergies  Codeine and Sulfonamide derivatives  Home Medications   Current Outpatient Rx  Name Route Sig Dispense Refill  . AMLODIPINE BESYLATE 10 MG PO TABS Oral Take 1 tablet (10 mg total) by mouth daily. 30 tablet 11  . CYANOCOBALAMIN 1000 MCG/ML IJ SOLN  Inject 1 mL daily x5 days ,then Inject 1ml weekly x 4 weeks, then monthly 10 mL 6  . DAPSONE 100 MG PO TABS Oral Take 1 tablet (100 mg total) by mouth daily. 30 tablet 6  . EFAVIRENZ-EMTRICITAB-TENOFOVIR 600-200-300 MG PO TABS Oral Take 1 tablet by mouth at bedtime. 30 tablet 11  . LABETALOL HCL 200 MG PO TABS Oral Take 200 mg by mouth 2 (two) times daily.    . TESTOSTERONE 20.25 MG/1.25GM (1.62%) TD GEL Transdermal Place 100 mg onto the skin every morning. 1.25 g 3    BP 132/81  Pulse 93  Temp(Src) 98.5 F (36.9 C) (Oral)  Resp 18  SpO2 96%  Physical Exam  Nursing note and vitals reviewed. Constitutional: He is oriented to person, place, and time. He appears well-developed and well-nourished. No distress.  HENT:  Head: Normocephalic.  Eyes: EOM are normal. Pupils are equal, round, and reactive to light. Right eye exhibits no discharge. Left eye exhibits no discharge.       Right sclera injected  Neck: Normal range of  motion. Neck supple.       No meningeal signs  Cardiovascular: Normal rate and regular rhythm.   Pulmonary/Chest: Effort normal and breath sounds normal. No respiratory distress. He has no wheezes.  Abdominal: Soft. He exhibits no distension. There is no tenderness.  Neurological: He is alert and oriented to person, place, and time.  Skin: Skin is warm.       Incision along left chest wall and axilla area consistent with history of biopsy. There is diffuse erythema around the incision. Incision does not have drainage or bleeding. There is no significant induration or palpable nodule. Mild tenderness to the area.  Psychiatric: He has a normal mood and affect. His  behavior is normal.    ED Course  Procedures   Results for orders placed during the hospital encounter of 12/13/11  CBC      Component Value Range   WBC 7.8  4.0 - 10.5 (K/uL)   RBC 3.60 (*) 4.22 - 5.81 (MIL/uL)   Hemoglobin 10.7 (*) 13.0 - 17.0 (g/dL)   HCT 16.1 (*) 09.6 - 52.0 (%)   MCV 88.6  78.0 - 100.0 (fL)   MCH 29.7  26.0 - 34.0 (pg)   MCHC 33.5  30.0 - 36.0 (g/dL)   RDW 04.5 (*) 40.9 - 15.5 (%)   Platelets 163  150 - 400 (K/uL)  DIFFERENTIAL      Component Value Range   Neutrophils Relative 50  43 - 77 (%)   Neutro Abs 3.9  1.7 - 7.7 (K/uL)   Lymphocytes Relative 38  12 - 46 (%)   Lymphs Abs 3.0  0.7 - 4.0 (K/uL)   Monocytes Relative 9  3 - 12 (%)   Monocytes Absolute 0.7  0.1 - 1.0 (K/uL)   Eosinophils Relative 2  0 - 5 (%)   Eosinophils Absolute 0.2  0.0 - 0.7 (K/uL)   Basophils Relative 0  0 - 1 (%)   Basophils Absolute 0.0  0.0 - 0.1 (K/uL)  BASIC METABOLIC PANEL      Component Value Range   Sodium 134 (*) 135 - 145 (mEq/L)   Potassium 4.1  3.5 - 5.1 (mEq/L)   Chloride 104  96 - 112 (mEq/L)   CO2 20  19 - 32 (mEq/L)   Glucose, Bld 170 (*) 70 - 99 (mg/dL)   BUN 24 (*) 6 - 23 (mg/dL)   Creatinine, Ser 8.11  0.50 - 1.35 (mg/dL)   Calcium 9.1  8.4 - 91.4 (mg/dL)   GFR calc non Af Amer 70 (*) >90 (mL/min)   GFR calc Af Amer 81 (*) >90 (mL/min)       1. Cellulitis   2. Diarrhea       MDM  4:50 AM patient seen and evaluated. Patient no acute distress.  Patient seen and discussed with attending physician. Plan for admission due to immunocompromise state.  Spoke with [facility] they will see patient and admit.      Angus Seller, [place] 12/13/11 [phone]

## 2011-12-13 NOTE — Progress Notes (Signed)
ANTIBIOTIC CONSULT NOTE - INITIAL  Pharmacy Consult for Vancomycin Indication: Cellulitis  Assessment: 51 yo male with HIV who presents with erythema and tenderness of lymph node biopsy site in L axilla for the past two days.  He has also had four days of watery diarrhea, which has resolved.  BCx pending, WBC 7.8, afebrile.  He received vancomycin 1000mg  IV x1 in the ED.   Goal of Therapy:  Vancomycin trough level 10-15 mcg/ml  Plan:  -Vancomycin 1000mg  IV q12h -Follow up microbiology data -Monitor renal function for dose adjustments  Bayard Beaver. Saul Fordyce, PharmD Pager: [phone]  12/13/2011 9:09 AM    Allergies  Allergen Reactions  . Codeine Itching, Rash and Other (See Comments)    "crazy dreams"  . Sulfonamide Derivatives Rash    Patient Measurements:   Patient reported weight: 215 lbs (97.7kg) Patient reported height: 6'0"  Vital Signs: Temp: 97.9 F (36.6 C) (03/17 0547) Temp src: Oral (03/17 0547) BP: 135/76 mmHg (03/17 0547) Pulse Rate: 82  (03/17 0547) Intake/Output from previous day:   Intake/Output from this shift:    Labs:  Basename 12/13/11 0510  WBC 7.8  HGB 10.7*  PLT 163  LABCREA --  CREATININE 1.19   The CrCl is unknown because both a height and weight (above a minimum accepted value) are required for this calculation. No results found for this basename: VANCOTROUGH:2,VANCOPEAK:2,VANCORANDOM:2,GENTTROUGH:2,GENTPEAK:2,GENTRANDOM:2,TOBRATROUGH:2,TOBRAPEAK:2,TOBRARND:2,AMIKACINPEAK:2,AMIKACINTROU:2,AMIKACIN:2, in the last 72 hours   Microbiology: Recent Results (from the past 720 hour(s))  CULTURE, BLOOD (SINGLE)     Status: Normal   Collection Time   11/17/11  9:49 AM      Component Value Range Status Comment   Organism ID, Bacteria NO GROWTH 5 DAYS   Final   CULTURE, BLOOD (SINGLE)     Status: Normal   Collection Time   11/17/11  9:52 AM      Component Value Range Status Comment   Organism ID, Bacteria NO GROWTH 5 DAYS   Final   CULTURE,  BLOOD (ROUTINE X 2)     Status: Normal   Collection Time   11/21/11  5:50 PM      Component Value Range Status Comment   Specimen Description BLOOD LEFT ARM   Final    Special Requests     Final    Value: BOTTLES DRAWN AEROBIC AND ANAEROBIC 10CC BLUE 5CC RED   Culture  Setup Time [id number]   Final    Culture NO GROWTH 5 DAYS   Final    Report Status 11/28/2011 FINAL   Final   CULTURE, BLOOD (ROUTINE X 2)     Status: Normal   Collection Time   11/21/11  5:59 PM      Component Value Range Status Comment   Specimen Description BLOOD RIGHT HAND   Final    Special Requests BOTTLES DRAWN AEROBIC AND ANAEROBIC 10CC   Final    Culture  Setup Time [id number]   Final    Culture NO GROWTH 5 DAYS   Final    Report Status 11/28/2011 FINAL   Final   MRSA PCR SCREENING     Status: Normal   Collection Time   11/21/11 11:07 PM      Component Value Range Status Comment   MRSA by PCR NEGATIVE  NEGATIVE  Final   FUNGUS CULTURE, BLOOD     Status: Normal   Collection Time   11/22/11  3:00 PM      Component Value Range Status Comment   Specimen Description BLOOD  LEFT ARM   Final    Special Requests BOTTLES DRAWN AEROBIC AND ANAEROBIC 10CC   Final    Culture NO GROWTH 7 DAYS   Final    Report Status 11/30/2011 FINAL   Final   CULTURE, BLOOD (ROUTINE X 2)     Status: Normal   Collection Time   11/22/11  3:06 PM      Component Value Range Status Comment   Specimen Description BLOOD LEFT ARM   Final    Special Requests BOTTLES DRAWN AEROBIC AND ANAEROBIC 10CC   Final    Culture  Setup Time [id number]   Final    Culture NO GROWTH 5 DAYS   Final    Report Status 11/28/2011 FINAL   Final   AFB CULTURE, BLOOD     Status: Normal (Preliminary result)   Collection Time   11/22/11  4:29 PM      Component Value Range Status Comment   Specimen Description BLOOD LEFT HAND   Final    Special Requests BOTTLES DRAWN AEROBIC ONLY 5CC   Final    Culture     Final    Value: CULTURE WILL BE EXAMINED FOR 6 WEEKS  BEFORE ISSUING A FINAL REPORT   Report Status PENDING   Incomplete   CULTURE, BLOOD (ROUTINE X 2)     Status: Normal   Collection Time   11/22/11  4:29 PM      Component Value Range Status Comment   Specimen Description BLOOD LEFT HAND   Final    Special Requests     Final    Value: BOTTLES DRAWN AEROBIC AND ANAEROBIC 10CC AER, 5CC ANA   Culture  Setup Time [id number]   Final    Culture NO GROWTH 5 DAYS   Final    Report Status 11/28/2011 FINAL   Final   FUNGUS CULTURE, BLOOD     Status: Normal   Collection Time   11/22/11  4:29 PM      Component Value Range Status Comment   Specimen Description BLOOD LEFT HAND   Final    Special Requests BOTTLES DRAWN AEROBIC AND ANAEROBIC 10CCAER 5CCANA   Final    Culture NO GROWTH 7 DAYS   Final    Report Status 11/29/2011 FINAL   Final   FUNGUS CULTURE W SMEAR     Status: Normal (Preliminary result)   Collection Time   11/24/11  1:25 PM      Component Value Range Status Comment   Specimen Description TISSUE AXILLA LEFT   Final    Special Requests NONE   Final    Fungal Smear NO YEAST OR FUNGAL ELEMENTS SEEN   Final    Culture CULTURE IN PROGRESS FOR FOUR WEEKS   Final    Report Status PENDING   Incomplete   AFB CULTURE WITH SMEAR     Status: Normal (Preliminary result)   Collection Time   11/24/11  1:25 PM      Component Value Range Status Comment   Specimen Description TISSUE AXILLA LEFT   Final    Special Requests NONE   Final    ACID FAST SMEAR NO ACID FAST BACILLI SEEN   Final    Culture     Final    Value: CULTURE WILL BE EXAMINED FOR 6 WEEKS BEFORE ISSUING A FINAL REPORT   Report Status PENDING   Incomplete   TISSUE CULTURE     Status: Normal   Collection Time   11/24/11  1:25 PM      Component Value Range Status Comment   Specimen Description TISSUE AXILLA LEFT   Final    Special Requests NONE   Final    Gram Stain     Final    Value: RARE WBC PRESENT, PREDOMINANTLY MONONUCLEAR     NO SQUAMOUS EPITHELIAL CELLS SEEN     NO  ORGANISMS SEEN   Culture NO GROWTH 3 DAYS   Final    Report Status 11/27/2011 FINAL   Final     Medical History: Past Medical History  Diagnosis Date  . Hypertension   . Renal insufficiency     hydrated cr 1.5 gfr 48  . HIV (human immunodeficiency virus infection)   . Hypogonadism male     Medications:   (Not in a hospital admission) Scheduled:    . vancomycin  1,000 mg Intravenous Once

## 2011-12-14 DIAGNOSIS — L02219 Cutaneous abscess of trunk, unspecified: Secondary | ICD-10-CM

## 2011-12-14 LAB — CBC
Hemoglobin: 10.4 g/dL — ABNORMAL LOW (ref 13.0–17.0)
MCH: 29.2 pg (ref 26.0–34.0)
MCHC: 32.9 g/dL (ref 30.0–36.0)

## 2011-12-14 NOTE — Progress Notes (Signed)
Subjective: Minimal pain.  Tenderness remains.  Surgeon attempted to drain area today but could not aspirate any fluid.      Objective: Vital signs in last 24 hours: Filed Vitals:   12/13/11 0950 12/13/11 1540 12/13/11 2058 12/14/11 0611  BP: 112/65 128/68 138/81 133/70  Pulse: 77 68 86 72  Temp: 98.1 F (36.7 C) 98.8 F (37.1 C) 98.8 F (37.1 C) 97.8 F (36.6 C)  TempSrc: Oral Oral Oral Oral  Resp: 18 18 18 20   Height: 6' (1.829 m)     Weight: 215 lb (97.523 kg)     SpO2: 99% 98% 100% 98%   Physical Exam:  General: alert, well-developed, and cooperative to examination.  Head: normocephalic and atraumatic.  Eyes: vision grossly intact, pupils equal, pupils round, pupils reactive to light, no injection and anicteric.  Mouth: pharynx pink and moist, no erythema, and no exudates.  Neck: no JVD Lungs: normal respiratory effort, no accessory muscle use, normal breath sounds, no crackles, and no wheezes. Heart: normal rate, regular rhythm, no murmur, no gallop, and no rub.   Pulses: 2+ DP/PT pulses bilaterally Extremities: No cyanosis, clubbing, edema Neurologic: alert & oriented X3, cranial nerves II-XII intact, strength normal in all extremities   Skin: Healing 3-4 inch incision L axilla. Significant surrounding erythema with mild induration, no significant improvement since yesterday. No definite fluctuance palpable. No drainage. Mild tenderness.  Hard mass palpable underneath likely enlarge lymph node given patient's history.    Lab Results: Basic Metabolic Panel:  Lab 12/13/11 4098  NA 134*  K 4.1  CL 104  CO2 20  GLUCOSE 170*  BUN 24*  CREATININE 1.19  CALCIUM 9.1  MG --  PHOS --   CBC:  Lab 12/14/11 0535 12/13/11 0510  WBC 6.7 7.8  NEUTROABS -- 3.9  HGB 10.4* 10.7*  HCT 31.6* 31.9*  MCV 88.8 88.6  PLT 150 163    Micro Results: Recent Results (from the past 240 hour(s))  CULTURE, BLOOD (ROUTINE X 2)     Status: Normal (Preliminary result)   Collection Time   12/13/11  5:15 AM      Component Value Range Status Comment   Specimen Description BLOOD LEFT ARM   Final    Special Requests BOTTLES DRAWN AEROBIC AND ANAEROBIC 10CC EACH   Final    Culture  Setup Time [id number]   Final    Culture     Final    Value:        BLOOD CULTURE RECEIVED NO GROWTH TO DATE CULTURE WILL BE HELD FOR 5 DAYS BEFORE ISSUING A FINAL NEGATIVE REPORT   Report Status PENDING   Incomplete   CULTURE, BLOOD (ROUTINE X 2)     Status: Normal (Preliminary result)   Collection Time   12/13/11  5:20 AM      Component Value Range Status Comment   Specimen Description BLOOD RIGHT ARM   Final    Special Requests BOTTLES DRAWN AEROBIC ONLY [facility]   Final    Culture  Setup Time [id number]   Final    Culture     Final    Value:        BLOOD CULTURE RECEIVED NO GROWTH TO DATE CULTURE WILL BE HELD FOR 5 DAYS BEFORE ISSUING A FINAL NEGATIVE REPORT   Report Status PENDING   Incomplete    Medications: I have reviewed the patient's current medications.  Scheduled Meds:   . amLODipine  10 mg Oral Daily  . cyanocobalamin  1,000 mcg Intramuscular Daily   Followed by  . cyanocobalamin  1,000 mcg Intramuscular Q Tue   Followed by  . cyanocobalamin  1,000 mcg Intramuscular Q30 days  . dapsone  100 mg Oral Daily  . efavirenz-emtrictabine-tenofovir  1 tablet Oral QHS  . enoxaparin  40 mg Subcutaneous Q24H  . labetalol  200 mg Oral BID  . testosterone  10 g Transdermal Daily  . vancomycin  1,000 mg Intravenous Q12H   Continuous Infusions:   . sodium chloride 75 mL/hr at 12/14/11 0600   PRN Meds:.acetaminophen, acetaminophen, ondansetron (ZOFRAN) IV, ondansetron  Assessment/Plan: Cellulitis of biopsy site: Has had tenderness and erythema for 2 days surrounding biopsy site from lymph node biopsy done 1 1/2 weeks ago. Seems to be an early cellulitis of the area. Bartonella antibodies negative, AFB cultures from biopsy negative so far, tissue culture from biopsy negative  so far, fungal cultures from biopsy negative so far. No improvement after one day of IV vancomycin.  No fever or leukocytosis. -Admit to regular floor  -Vancomycin  -Surgery consult following -Tylenol for pain control   Diarrhea: Had four days of water diarrhea, multiple times per hour at its worst. Resolving at this point.  C diff negative.   -Stool cultures ordered by ED team pending   Anemia: patient's Hgb has rebounded to 10.4, was as low as 7.0 last admisison. MCV 89. B12 was 131 last admission. Intrinsic factor negative. Ferrtin was 395 last admission. Has received 3 days of B12 supplementation so far.  -Continue B12 supplementation daily for 3 more days, then weekly for a month, then monthly   HIV: will continue Atripla. CD4 110 last month. VL <20 last month. Continue dapsone for PCP prophylaxis. ID to recheck VL and CD4 at end of April.  DVT prophylaxis: lovenox   LOS: 1 day   Blanca Friend 12/14/2011, 12:23 PM

## 2011-12-14 NOTE — Progress Notes (Signed)
  Subjective: Still with pain in left axilla.  Slightly improved  Objective: Vital signs in last 24 hours: Temp:  [97.8 F (36.6 C)-98.8 F (37.1 C)] 97.8 F (36.6 C) (03/18 0611) Pulse Rate:  [68-86] 72  (03/18 0611) Resp:  [18-20] 20  (03/18 0611) BP: (112-138)/(65-81) 133/70 mmHg (03/18 0611) SpO2:  [98 %-100 %] 98 % (03/18 0611) Weight:  [215 lb (97.523 kg)] 215 lb (97.523 kg) (03/17 0950) Last BM Date: 12/11/11  Intake/Output from previous day: 03/17 0701 - 03/18 0700 In: 2435 [P.O.:660; I.V.:1575; IV Piggyback:200] Out: -  Intake/Output this shift:    Cellulitis left axilla I inserted an 18 gauge needle into the incision and found no seroma or purulence  Lab Results:   Basename 12/14/11 0535 12/13/11 0510  WBC 6.7 7.8  HGB 10.4* 10.7*  HCT 31.6* 31.9*  PLT 150 163   BMET  Basename 12/13/11 0510  NA 134*  K 4.1  CL 104  CO2 20  GLUCOSE 170*  BUN 24*  CREATININE 1.19  CALCIUM 9.1   PT/INR No results found for this basename: LABPROT:2,INR:2 in the last 72 hours ABG No results found for this basename: PHART:2,PCO2:2,PO2:2,HCO3:2 in the last 72 hours  Studies/Results: No results found.  Anti-infectives: Anti-infectives     Start     Dose/Rate Route Frequency Ordered Stop   12/13/11 2200  efavirenz-emtrictabine-tenofovir (ATRIPLA) 600-200-300 MG per tablet 1 tablet       1 tablet Oral Daily at bedtime 12/13/11 0946     12/13/11 1800   vancomycin (VANCOCIN) IVPB 1000 mg/200 mL premix        1,000 mg 200 mL/hr over 60 Minutes Intravenous Every 12 hours 12/13/11 0910     12/13/11 1200   dapsone tablet 100 mg        100 mg Oral Daily 12/13/11 0946     12/13/11 0630   vancomycin (VANCOCIN) IVPB 1000 mg/200 mL premix        1,000 mg 200 mL/hr over 60 Minutes Intravenous  Once 12/13/11 0620 12/13/11 0759          Assessment/Plan: s/p * No surgery found *  Left axillary post op cellulitis Continue IV abx  LOS: 1 day    Manas Hickling  A 12/14/2011

## 2011-12-14 NOTE — H&P (Signed)
Internal Medicine Attending Admission Note Date: 12/14/2011  Patient name: Patrick [name] Medical record number: [id number] Date of birth: 1961/05/09 Age: 51 y.o. Gender: male  I saw and evaluated the patient. I reviewed the resident's note and I agree with the resident's findings and plan as documented in the resident's note.  Chief Complaint(s): Infection in my left arm pit.  History - key components related to admission:  Patrick [name] is a 51 year old man with a history of HIV and hypertension who presents with a two-day history of an erythematous, painful rash in the left axilla surrounding a recent lymph node biopsy incision. He was in his usual state of health until 2 days ago when he noticed some discomfort under his axilla. At that point he saw the redness and was concerned about an infection. He therefore presented to the emergency department for further evaluation. He denies any fevers or chills but did state that he had 4 days of diarrhea earlier in the week which has since resolved. He also denies chest pain, shortness of breath, sick contacts, nausea, or vomiting. There has been no trauma to that site since the biopsy. He is without other complaints at this time.  Physical Exam - key components related to admission:  Filed Vitals:   12/13/11 0950 12/13/11 1540 12/13/11 2058 12/14/11 0611  BP: 112/65 128/68 138/81 133/70  Pulse: 77 68 86 72  Temp: 98.1 F (36.7 C) 98.8 F (37.1 C) 98.8 F (37.1 C) 97.8 F (36.6 C)  TempSrc: Oral Oral Oral Oral  Resp: 18 18 18 20   Height: 6' (1.829 m)     Weight: 215 lb (97.523 kg)     SpO2: 99% 98% 100% 98%   General: Well-developed, well-nourished, man lying comfortably in bed in no acute distress. Neck: Shotty adenopathy bilaterally that has markedly decreased since his presentation in February. Left axilla: Approximate 4 cm incision is clean and dry. There is erythema surrounding the incision with associated induration. There is mild  edema in this area but no tenderness to palpation. Extremities: No edema or other rashes.  Lab results:  Basic Metabolic Panel:  Basename 12/13/11 0510  NA 134*  K 4.1  CL 104  CO2 20  GLUCOSE 170*  BUN 24*  CREATININE 1.19  CALCIUM 9.1  MG --  PHOS --   CBC:  Basename 12/14/11 0535 12/13/11 0510  WBC 6.7 7.8  NEUTROABS -- 3.9  HGB 10.4* 10.7*  HCT 31.6* 31.9*  MCV 88.8 88.6  PLT 150 163   Assessment & Plan by Problem:  Patrick [name] is a 51 year old man with a history of HIV and hypertension who presents with a peri-incisional cellulitis after a recent lymph node biopsy. Surgery has explored the lesion with a needle and did not find a seroma or abscess. This therefore likely represents a strep or staph cellulitis.  1) Cellulitis: Agree with housestaff's plan to treat with IV vancomycin initially. We will switch to an oral regimen that is likely to cover both staph and strep. Bactrim is not an option given his sulfa allergy. This switch to an oral regimen will be made when we note improvement in the cellulitis clinically on the IV antibiotics.

## 2011-12-15 MED ORDER — CYANOCOBALAMIN 1000 MCG/ML IJ SOLN: INTRAMUSCULAR | Status: DC

## 2011-12-15 MED ORDER — DOXYCYCLINE HYCLATE 100 MG PO TABS: 100.0000 mg | ORAL_TABLET | Freq: Two times a day (BID) | ORAL | Status: DC

## 2011-12-15 MED ORDER — AMOXICILLIN 875 MG PO TABS: 875.0000 mg | ORAL_TABLET | Freq: Two times a day (BID) | ORAL | Status: DC

## 2011-12-15 NOTE — Progress Notes (Addendum)
Subjective: Minimal pain and tenderness.  No diarrhea.  No overnight events.    Objective: Vital signs in last 24 hours: Filed Vitals:   12/14/11 1342 12/14/11 2058 12/14/11 2209 12/15/11 0556  BP: 135/53 147/79 147/79 128/73  Pulse: 79 75 75 76  Temp: 98.4 F (36.9 C) 98.3 F (36.8 C)  97.5 F (36.4 C)  TempSrc:  Oral    Resp: 20 20  18   Height:      Weight:      SpO2: 99% 97%  99%   Physical Exam:  General: alert, well-developed, and cooperative to examination.  Head: normocephalic and atraumatic.  Eyes: vision grossly intact, pupils equal, pupils round, pupils reactive to light, no injection and anicteric.  Mouth: pharynx pink and moist, no erythema, and no exudates.  Neck: no JVD Lungs: normal respiratory effort, no accessory muscle use, normal breath sounds, no crackles, and no wheezes. Heart: normal rate, regular rhythm, no murmur, no gallop, and no rub.   Pulses: 2+ DP/PT pulses bilaterally Neurologic: alert & oriented X3, cranial nerves II-XII intact  Skin: Healing 3-4 inch incision L axilla. Significant surrounding erythema with mild induration.  Overall spread of erythema is not changed, but erythema and induration seems to be lesser severity than yesterday. No definite fluctuance palpable. No drainage. Mild tenderness. Hard mass palpable underneath likely enlarge lymph node given patient's history.   Lab Results: Basic Metabolic Panel:  Lab 12/13/11 4098  NA 134*  K 4.1  CL 104  CO2 20  GLUCOSE 170*  BUN 24*  CREATININE 1.19  CALCIUM 9.1  MG --  PHOS --   CBC:  Lab 12/14/11 0535 12/13/11 0510  WBC 6.7 7.8  NEUTROABS -- 3.9  HGB 10.4* 10.7*  HCT 31.6* 31.9*  MCV 88.8 88.6  PLT 150 163     Micro Results: Recent Results (from the past 240 hour(s))  CULTURE, BLOOD (ROUTINE X 2)     Status: Normal (Preliminary result)   Collection Time   12/13/11  5:15 AM      Component Value Range Status Comment   Specimen Description BLOOD LEFT ARM   Final     Special Requests BOTTLES DRAWN AEROBIC AND ANAEROBIC 10CC EACH   Final    Culture  Setup Time [id number]   Final    Culture     Final    Value:        BLOOD CULTURE RECEIVED NO GROWTH TO DATE CULTURE WILL BE HELD FOR 5 DAYS BEFORE ISSUING A FINAL NEGATIVE REPORT   Report Status PENDING   Incomplete   CULTURE, BLOOD (ROUTINE X 2)     Status: Normal (Preliminary result)   Collection Time   12/13/11  5:20 AM      Component Value Range Status Comment   Specimen Description BLOOD RIGHT ARM   Final    Special Requests BOTTLES DRAWN AEROBIC ONLY [facility]   Final    Culture  Setup Time [id number]   Final    Culture     Final    Value:        BLOOD CULTURE RECEIVED NO GROWTH TO DATE CULTURE WILL BE HELD FOR 5 DAYS BEFORE ISSUING A FINAL NEGATIVE REPORT   Report Status PENDING   Incomplete   CLOSTRIDIUM DIFFICILE BY PCR     Status: Normal   Collection Time   12/14/11  1:23 PM      Component Value Range Status Comment   C difficile by pcr NEGATIVE  NEGATIVE  Final    Medications: I have reviewed the patient's current medications. Scheduled Meds:   . amLODipine  10 mg Oral Daily  . cyanocobalamin  1,000 mcg Intramuscular Daily   Followed by  . cyanocobalamin  1,000 mcg Intramuscular Q Tue   Followed by  . cyanocobalamin  1,000 mcg Intramuscular Q30 days  . dapsone  100 mg Oral Daily  . efavirenz-emtrictabine-tenofovir  1 tablet Oral QHS  . enoxaparin  40 mg Subcutaneous Q24H  . labetalol  200 mg Oral BID  . testosterone  10 g Transdermal Daily  . vancomycin  1,000 mg Intravenous Q12H   Continuous Infusions:   . DISCONTD: sodium chloride 75 mL/hr at 12/14/11 1314   PRN Meds:.acetaminophen, acetaminophen, ondansetron (ZOFRAN) IV, ondansetron   Assessment/Plan:  Cellulitis of biopsy site: Has had tenderness and erythema for 3 days surrounding biopsy site from lymph node biopsy done 1 1/2 weeks ago. Seems to be an early cellulitis of the area. Bartonella antibodies negative, AFB  cultures from biopsy negative so far, tissue culture from biopsy negative so far, fungal cultures from biopsy negative so far. No fever or leukocytosis.  Seems to be a improved today after two days of IV Vancomycin -Consider switching to PO antibiotics pending surgery's assessment.  Amoxicillin and doxycycline together would provide good coverage.  -Question for surgery consultant: What is the dispo plan for this patient?  Is he ready for PO antibiotics and possible discharge today or tomorrow?  Greatly appreciate your consultation.   -Tylenol for pain control   Diarrhea: Resolved.  C Diff negative.  Anemia: patient's Hgb has rebounded to 10.4, was as low as 7.0 last admisison. MCV 89. B12 was 131 last admission. Intrinsic factor negative. Ferrtin was 395 last admission. Has received 5 days of B12 supplementation so far.  -Continue B12 supplementation daily for 2 more days, then weekly for a month, then monthly   HIV: will continue Atripla. CD4 110 last month. VL <20 last month. Continue dapsone for PCP prophylaxis. ID to recheck VL and CD4 at end of April.   DVT prophylaxis: lovenox    LOS: 2 days   Patrick [name], [name] 12/15/2011, 8:08 AM

## 2011-12-15 NOTE — Progress Notes (Signed)
Patient ID: Patrick [name], male   DOB: 1961-01-30, 51 y.o.   MRN: [id number]    Subjective: Pt feels well.  Says redness is decreased today.  Objective: Vital signs in last 24 hours: Temp:  [97.5 F (36.4 C)-98.4 F (36.9 C)] 97.5 F (36.4 C) (03/19 0556) Pulse Rate:  [75-79] 76  (03/19 0556) Resp:  [18-20] 18  (03/19 0556) BP: (128-147)/(53-79) 128/73 mmHg (03/19 0556) SpO2:  [97 %-99 %] 99 % (03/19 0556) Last BM Date: 12/14/11  Intake/Output from previous day: 03/18 0701 - 03/19 0700 In: 1265 [P.O.:240; I.V.:825; IV Piggyback:200] Out: -  Intake/Output this shift:    PE: Chest: cellulitis around wound is improving given where his outline is.  He is minimally tender and this area is very soft.   Lab Results:   Basename 12/14/11 0535 12/13/11 0510  WBC 6.7 7.8  HGB 10.4* 10.7*  HCT 31.6* 31.9*  PLT 150 163   BMET  Basename 12/13/11 0510  NA 134*  K 4.1  CL 104  CO2 20  GLUCOSE 170*  BUN 24*  CREATININE 1.19  CALCIUM 9.1   PT/INR No results found for this basename: LABPROT:2,INR:2 in the last 72 hours CMP     Component Value Date/Time   NA 134* 12/13/2011 0510   K 4.1 12/13/2011 0510   CL 104 12/13/2011 0510   CO2 20 12/13/2011 0510   GLUCOSE 170* 12/13/2011 0510   BUN 24* 12/13/2011 0510   CREATININE 1.19 12/13/2011 0510   CREATININE 1.25 12/03/2011 0934   CALCIUM 9.1 12/13/2011 0510   CALCIUM 9.6 09/03/2011 1652   PROT 7.5 11/22/2011 0610   ALBUMIN 2.5* 11/22/2011 0610   AST 8 11/22/2011 0610   ALT 7 11/22/2011 0610   ALKPHOS 50 11/22/2011 0610   BILITOT 0.2* 11/22/2011 0610   GFRNONAA 70* 12/13/2011 0510   GFRAA 81* 12/13/2011 0510   Lipase     Component Value Date/Time   LIPASE 19 01/17/2009 1952       Studies/Results: No results found.  Anti-infectives: Anti-infectives     Start     Dose/Rate Route Frequency Ordered Stop   12/13/11 2200  efavirenz-emtrictabine-tenofovir (ATRIPLA) 600-200-300 MG per tablet 1 tablet       1 tablet Oral Daily at  bedtime 12/13/11 0946     12/13/11 1800   vancomycin (VANCOCIN) IVPB 1000 mg/200 mL premix        1,000 mg 200 mL/hr over 60 Minutes Intravenous Every 12 hours 12/13/11 0910     12/13/11 1200   dapsone tablet 100 mg        100 mg Oral Daily 12/13/11 0946     12/13/11 0630   vancomycin (VANCOCIN) IVPB 1000 mg/200 mL premix        1,000 mg 200 mL/hr over 60 Minutes Intravenous  Once 12/13/11 0620 12/13/11 0759           Assessment/Plan  1. Left chest wall cellulitis s/p LN bx  Plan: 1. Cont IV abx therapy.  Appears site is improving.   LOS: 2 days    [name] E 12/15/2011

## 2011-12-15 NOTE — Progress Notes (Signed)
I have seen and examined the patient and agree with the assessment and plans.  Rosine Solecki A. Taylor Spilde  MD, FACS  

## 2011-12-15 NOTE — Discharge Summary (Signed)
Internal Medicine Teaching [facility] Discharge Note  Name: Patrick [name] MRN: [id number] DOB: [birth year] 50 y.o.  Date of Admission: 12/13/2011  1:04 AM Date of Discharge: 12/15/2011 Attending Physician: Dr. Doneen Poisson  Discharge Diagnosis:  Cellulitis of Lymph Node Biopsy Site L Axilla Diarrhea  History of Recurrent Lymphadenopathy Anemia HIV  Discharge Medications: Medication List  As of 12/16/2011 11:38 AM   TAKE these medications         amLODipine 10 MG tablet   Commonly known as: NORVASC   Take 1 tablet (10 mg total) by mouth daily.      amoxicillin 875 MG tablet   Commonly known as: AMOXIL   Take 1 tablet (875 mg total) by mouth 2 (two) times daily.      cyanocobalamin 1000 MCG/ML injection   Commonly known as: (VITAMIN B-12)   Inject 1 mL daily x2 days ,then Inject 1ml weekly x 4 weeks, then monthly      dapsone 100 MG tablet   Take 1 tablet (100 mg total) by mouth daily.      doxycycline 100 MG tablet   Commonly known as: VIBRA-TABS   Take 1 tablet (100 mg total) by mouth 2 (two) times daily.      efavirenz-emtrictabine-tenofovir 600-200-300 MG per tablet   Commonly known as: ATRIPLA   Take 1 tablet by mouth at bedtime.      labetalol 200 MG tablet   Commonly known as: NORMODYNE   Take 200 mg by mouth 2 (two) times daily.      Testosterone 20.25 MG/1.25GM (1.62%) Gel   Place 100 mg onto the skin every morning.            Disposition and follow-up:   Patrick [name] was discharged from [facility] in Good condition.  He has an appt at [facility] clinic in 1 week to have his L axilla cellulitis checked.     Discharge Orders    Future Appointments: Provider: Department: Dept Phone: Center:   12/17/2011 10:00 AM Imp[facility] Nurse [facility] [phone] [facility]   12/22/2011 10:45 AM Darnelle Maffucci, MD [facility] [phone] [facility]   12/24/2011 10:00 AM Imp[facility]Imcr Nurse [facility] [phone] [facility]   12/31/2011 10:00 AM  Imp-Imcr Nurse [facility] [phone] [facility]   01/05/2012 9:00 AM [facility] [id number] [facility]   01/19/2012 9:00 AM Cliffton Asters, MD [facility] [phone] [facility]     Future Orders Please Complete By Expires   Diet general      Activity as tolerated - No restrictions      Call MD for:  temperature >100.4      Call MD for:  severe uncontrolled pain      Call MD for:      Comments:   Increased redness or swelling of biopsy site      Consultations:  Surgery  Procedures Performed:  Dg Chest 2 View  11/21/2011  *RADIOLOGY REPORT*  Clinical Data: Fever.  Nausea.  Lymphadenopathy in the neck, groin, and axilla.  CHEST - 2 VIEW 11/21/2011:  Comparison: Two-view chest x-ray 11/17/2011, 10/02/2011, 12/25/2010, 11/28/2009 [facility].  Findings: Cardiac silhouette upper normal in size, unchanged. Hilar and mediastinal contours otherwise unremarkable.  Lungs clear.  Bronchovascular markings normal.  Pulmonary vascularity normal.  No pneumothorax.  No pleural effusions.  Mild eventration of the right anterior hemidiaphragm, unchanged.  Visualized bony thorax intact.  IMPRESSION: No  acute cardiopulmonary disease.  Stable examination.  Original Report Authenticated By: Arnell Sieving, M.D.   Dg Chest 2 View  11/17/2011  *RADIOLOGY REPORT*  Clinical Data: Fever, flu like symptoms  CHEST - 2 VIEW  Comparison: 10/02/2011  Findings: Cardiomediastinal silhouette is stable.  No acute infiltrate or pleural effusion.  No pulmonary edema.  The bony thorax is stable.  IMPRESSION: No active disease.  No significant change.  Original Report Authenticated By: Natasha Mead, M.D.    Admission HPI:  Patrick [name] is a 51 year old man who was recently admitted to our surgery for lympadenopathy associated with fever and underwent diagnostic biopsy by surgery team that has not shown etiology as of yet, HIV, and HTN who presents due to erythema and irritation/tenderness of the  lymph node biopsy site in L axilla for the past two days. No fevers or chills. He has been abnormally sweaty/clammy today. His lymphadenopathy is now resolved.  He also report four days of diarrhea this week. Watery and multiple times per hour at its worst. Has been resolved since yesterday morning. He has been trying to drink a lot of water as a result, and he has been urinating a lot.  No CP, SOB, abdominal pain, numbness/tingling. No dysuria or hematuria. No nausea or vomiting. No rashes.    Hospital Course by problem list:  Cellulitis of Lymph Node Biopsy Site L Axilla: Likely mild cellulitis spread 7cm downward from biopsy incision scar L axilla.  No leukocytosis, no fevers.  Improved after two days of IV vancomycin.  Patient was discharged on amoxicillin plus doxycycline for 7 more days.  Has OPC follow-up in 1 week to check for resolution.  Additional antibiotics may be required at that time.  Diarrhea: Four days of watery diarrhea that has resolved by admission.  BMET looked good.  C Diff negative.    History of Recurrent Lymphadenopathy: Biopsy workup so far negative for lymphoma, infection.  Being followed by ID as outpatient.  Anemia: B12 deficiency.  Hgb increased from discharge last admission.  Continue B12 supplementation.  HIV: CD4 110 and VL < 20 month before admission.  Continue Atripla and dapsone for PCP prophylaxis.    Discharge Vitals:  BP 132/72  Pulse 85  Temp(Src) 97.5 F (36.4 C) (Oral)  Resp 16  Ht 6' (1.829 m)  Wt 215 lb (97.523 kg)  BMI 29.16 kg/m2  SpO2 98%  Discharge Labs:  Admission on 12/13/2011, Discharged on 12/15/2011  Component Date Value Range Status  . WBC (K/uL) 12/13/2011 7.8  4.0-10.5 Final  . RBC (MIL/uL) 12/13/2011 3.60* 4.22-5.81 Final  . Hemoglobin (g/dL) 16/06/9603 54.0* 98.1-19.1 Final  . HCT (%) 12/13/2011 31.9* 39.0-52.0 Final  . MCV (fL) 12/13/2011 88.6  78.0-100.0 Final  . MCH (pg) 12/13/2011 29.7  26.0-34.0 Final  . MCHC (g/dL)  47/82/9562 13.0  86.5-78.4 Final  . RDW (%) 12/13/2011 16.5* 11.5-15.5 Final  . Platelets (K/uL) 12/13/2011 163  150-400 Final  . Neutrophils Relative (%) 12/13/2011 50  43-77 Final  . Neutro Abs (K/uL) 12/13/2011 3.9  1.7-7.7 Final  . Lymphocytes Relative (%) 12/13/2011 38  12-46 Final  . Lymphs Abs (K/uL) 12/13/2011 3.0  0.7-4.0 Final  . Monocytes Relative (%) 12/13/2011 9  3-12 Final  . Monocytes Absolute (K/uL) 12/13/2011 0.7  0.1-1.0 Final  . Eosinophils Relative (%) 12/13/2011 2  0-5 Final  . Eosinophils Absolute (K/uL) 12/13/2011 0.2  0.0-0.7 Final  . Basophils Relative (%) 12/13/2011 0  0-1 Final  . Basophils  Absolute (K/uL) 12/13/2011 0.0  0.0-0.1 Final  . Sodium (mEq/L) 12/13/2011 134* 135-145 Final  . Potassium (mEq/L) 12/13/2011 4.1  3.5-5.1 Final  . Chloride (mEq/L) 12/13/2011 104  96-112 Final  . CO2 (mEq/L) 12/13/2011 20  19-32 Final  . Glucose, Bld (mg/dL) 16/06/9603 540* 98-11 Final  . BUN (mg/dL) 91/47/8295 24* 6-21 Final  . Creatinine, Ser (mg/dL) 30/86/5784 6.96  2.95-2.84 Final  . Calcium (mg/dL) 13/24/4010 9.1  2.7-25.3 Final  . GFR calc non Af Amer (mL/min) 12/13/2011 70* >90 Final  . GFR calc Af Amer (mL/min) 12/13/2011 81* >90 Final   Comment:                                 The eGFR has been calculated                          using the CKD EPI equation.                          This calculation has not been                          validated in all clinical                          situations.                          eGFR's persistently                          <90 mL/min signify                          possible Chronic Kidney Disease.  Marland Kitchen Specimen Description  12/13/2011 BLOOD LEFT ARM   Final  . Special Requests  12/13/2011 BOTTLES DRAWN AEROBIC AND ANAEROBIC 10CC EACH   Final  . Culture  Setup Time  12/13/2011 [id number]   Final  . Culture  12/13/2011        BLOOD CULTURE RECEIVED NO GROWTH TO DATE CULTURE WILL BE HELD FOR 5 DAYS BEFORE ISSUING A  FINAL NEGATIVE REPORT   Final  . Report Status  12/13/2011 PENDING   Incomplete  . Specimen Description  12/13/2011 BLOOD RIGHT ARM   Final  . Special Requests  12/13/2011 BOTTLES DRAWN AEROBIC ONLY 9CC   Final  . Culture  Setup Time  12/13/2011 [id number]   Final  . Culture  12/13/2011        BLOOD CULTURE RECEIVED NO GROWTH TO DATE CULTURE WILL BE HELD FOR 5 DAYS BEFORE ISSUING A FINAL NEGATIVE REPORT   Final  . Report Status  12/13/2011 PENDING   Incomplete  . C difficile by pcr  12/14/2011 NEGATIVE  NEGATIVE Final  . WBC (K/uL) 12/14/2011 6.7  4.0-10.5 Final  . RBC (MIL/uL) 12/14/2011 3.56* 4.22-5.81 Final  . Hemoglobin (g/dL) 51/88/4166 06.3* 01.6-01.0 Final  . HCT (%) 12/14/2011 31.6* 39.0-52.0 Final  . MCV (fL) 12/14/2011 88.8  78.0-100.0 Final  . MCH (pg) 12/14/2011 29.2  26.0-34.0 Final  . MCHC (g/dL) 93/23/5573 22.0  25.4-27.0 Final  . RDW (%) 12/14/2011 16.3* 11.5-15.5 Final  . Platelets (K/uL) 12/14/2011 150  150-400 Final  ]  Signed: Izac Faulkenberry, [name] 12/16/2011, 11:38 AM

## 2011-12-15 NOTE — Progress Notes (Signed)
Internal Medicine Attending  Date: 12/15/2011  Patient name: Patrick [name] Medical record number: [id number] Date of birth: April 08, 1961 Age: 51 y.o. Gender: male  I saw and evaluated the patient. I reviewed the resident's note by Dr. Yaakov Guthrie and I agree with the resident's findings and plans as documented in his progress note.  Patrick [name] is feeling much better today and the rash has improved on examination. I agree with switching him to an oral antibiotic regimen and discharging him home today with follow up in the Internal Medicine Center.

## 2011-12-16 ENCOUNTER — Encounter (INDEPENDENT_AMBULATORY_CARE_PROVIDER_SITE_OTHER): Payer: Self-pay | Admitting: Surgery

## 2011-12-16 ENCOUNTER — Ambulatory Visit (INDEPENDENT_AMBULATORY_CARE_PROVIDER_SITE_OTHER): Payer: Medicaid Other | Admitting: Surgery

## 2011-12-16 VITALS — BP 127/71 | HR 78 | Temp 97.8°F | Ht 72.0 in | Wt 219.6 lb

## 2011-12-16 DIAGNOSIS — R599 Enlarged lymph nodes, unspecified: Secondary | ICD-10-CM

## 2011-12-16 NOTE — Progress Notes (Signed)
Visit Diagnoses: 1. LYMPHADENOPATHY, DIFFUSE     HISTORY: The patient is a 51 year old white male who underwent left axillary lymph node excision for tissue diagnosis of generalized lymphadenopathy. Patient has known HIV infection and is on drug therapy. Final pathology showed no sign of malignancy. Final impression was that lymphadenopathy was related to the underlying HIV infection.  Patient developed cellulitis around the site of infection. He has a previous history of MRSA. He was treated in patient with IV vancomycin. He is now taking doxycycline.  EXAM: Surgical wound is healing nicely. Mild soft tissue edema. There may be a small lymphocele at the site of lymph node excision. There is no sign of abscess. There is mild erythema around the incision but this appears to be a resolving cellulitis. The wound itself is intact and closed.  IMPRESSION: #1 generalized lymphadenopathy secondary to HIV infection #2 resolving cellulitis left axillary wound  PLAN: Patient is scheduled to followup with his primary physician at [facility] internal medicine within the next few days. He continues regular followup with his infectious disease specialist also at [facility].  Patient will return to see me in this office as needed.  Velora Heckler, MD, FACS General & Endocrine Surgery [facility]

## 2011-12-16 NOTE — Patient Instructions (Signed)
  COCOA BUTTER & VITAMIN E CREAM  ([employer] or other brand)  Apply cocoa butter/vitamin E cream to your incision 2 - 3 times daily.  Massage cream into incision for one minute with each application.  Use sunscreen (50 SPF or higher) for first 6 months after surgery if area is exposed to sun.  You may substitute Mederma or other scar reducing creams as desired.   

## 2011-12-17 ENCOUNTER — Ambulatory Visit: Payer: Medicaid Other

## 2011-12-18 LAB — FUNGUS CULTURE W SMEAR

## 2011-12-19 LAB — CULTURE, BLOOD (ROUTINE X 2): Culture: NO GROWTH

## 2011-12-19 LAB — CREATININE, SERUM: Creatinine, Ser: 1.39 mg/dL — ABNORMAL HIGH (ref 0.50–1.35)

## 2011-12-22 ENCOUNTER — Ambulatory Visit (INDEPENDENT_AMBULATORY_CARE_PROVIDER_SITE_OTHER): Payer: Medicaid Other | Admitting: Internal Medicine

## 2011-12-22 ENCOUNTER — Encounter: Payer: Self-pay | Admitting: Internal Medicine

## 2011-12-22 VITALS — BP 137/77 | HR 74 | Temp 97.9°F | Ht 72.0 in | Wt 231.1 lb

## 2011-12-22 DIAGNOSIS — L0390 Cellulitis, unspecified: Secondary | ICD-10-CM

## 2011-12-22 DIAGNOSIS — L02219 Cutaneous abscess of trunk, unspecified: Secondary | ICD-10-CM

## 2011-12-22 MED ORDER — DOXYCYCLINE HYCLATE 100 MG PO TABS: 100.0000 mg | ORAL_TABLET | Freq: Two times a day (BID) | ORAL | Status: AC

## 2011-12-22 MED ORDER — AMOXICILLIN 875 MG PO TABS: 875.0000 mg | ORAL_TABLET | Freq: Two times a day (BID) | ORAL | Status: AC

## 2011-12-22 NOTE — Progress Notes (Signed)
Patient ID: Patrick [name], male   DOB: April 21, 1961, 51 y.o.   MRN: [id number]  HPI:   Patient is a 51 year old male with a past medical history listed below presents to the outpatient clinic for a hospital followup after he was admitted for left axilla cellulitis, this was initially treated with IV antibiotics and patient was discharged with oral doxycycline. Patient reports completing antibiotics today. Denies any fever or chills, no other complaints.  Review of Systems: Negative except per history of present illness  Physical Exam:  Nursing notes and vitals reviewed General:  alert, well-developed, and cooperative to examination.   Lungs:  normal respiratory effort, no accessory muscle use, normal breath sounds, no crackles, and no wheezes. Heart:  normal rate, regular rhythm, no murmurs, no gallop, and no rub.   Abdomen:  soft, non-tender, normal bowel sounds, no distention, no guarding, no rebound tenderness, no hepatomegaly, and no splenomegaly.   Extremities:  No cyanosis, clubbing, edema.  -Left axilla: Resolving erythema, no lymphadenopathy. Mildly tender to palpation Neurologic:  alert & oriented X3, nonfocal exam  Meds: Medications Prior to Admission  Medication Sig Dispense Refill  . amLODipine (NORVASC) 10 MG tablet Take 1 tablet (10 mg total) by mouth daily.  30 tablet  11  . amoxicillin (AMOXIL) 875 MG tablet Take 1 tablet (875 mg total) by mouth 2 (two) times daily.  14 tablet  0  . cyanocobalamin (,VITAMIN B-12,) 1000 MCG/ML injection Inject 1 mL daily x2 days ,then Inject 1ml weekly x 4 weeks, then monthly  10 mL  6  . dapsone 100 MG tablet Take 1 tablet (100 mg total) by mouth daily.  30 tablet  6  . doxycycline (VIBRA-TABS) 100 MG tablet Take 1 tablet (100 mg total) by mouth 2 (two) times daily.  14 tablet  0  . efavirenz-emtrictabine-tenofovir (ATRIPLA) 600-200-300 MG per tablet Take 1 tablet by mouth at bedtime.  30 tablet  11  . labetalol (NORMODYNE) 200 MG tablet Take  200 mg by mouth 2 (two) times daily.      . Testosterone 20.25 MG/1.25GM (1.62%) GEL Place 100 mg onto the skin every morning.  1.25 g  3   No current facility-administered medications on file as of 12/22/2011.    Allergies: Codeine and Sulfonamide derivatives Past Medical History  Diagnosis Date  . Hypertension   . Renal insufficiency     hydrated cr 1.5 gfr 48  . HIV (human immunodeficiency virus infection)   . Hypogonadism male    Past Surgical History  Procedure Date  . Lithotripsy 2010  . Appendectomy 2001    via midline incision  . Lymph node biopsy 2010, 2011    bilateral... by dr Derrell Lolling   Family History  Problem Relation Age of Onset  . Adopted: Yes   History   Social History  . Marital Status: Single    Spouse Name: N/A    Number of Children: N/A  . Years of Education: N/A   Occupational History  . Not on file.   Social History Main Topics  . Smoking status: Never Smoker   . Smokeless tobacco: Never Used  . Alcohol Use: 0.5 oz/week    1 drink(s) per week     occasionallt  . Drug Use: No  . Sexually Active: Yes -- Male partner(s)    Birth Control/ Protection: Condom     refused condoms   Other Topics Concern  . Not on file   Social History Narrative  . No narrative  on file

## 2011-12-22 NOTE — Assessment & Plan Note (Signed)
Patient's otherwise is located in the left axilla around the incisional scar pelvis performed back in February 2013 for lymph node biopsy. Patient was hospitalized for the cellulitis was given IV antibiotics initially, and was discharged with oral Augmentin and doxycycline. Today the infection appears to be resolving but there are still signs of inflammation and redness in his axillary region. Given HIV status and low CD4 count. We'll continue antibiotics for one more week and followup in the clinic to ensure resolution.

## 2011-12-22 NOTE — Patient Instructions (Signed)
Cellulitis Cellulitis is an infection of the tissue under the skin. The infected area is usually red and tender. This is caused by germs. These germs enter the body through cuts or sores. This usually happens in the arms or lower legs. HOME CARE   Take your medicine as told. Finish it even if you start to feel better.   If the infection is on the arm or leg, keep it raised (elevated).   Use a warm cloth on the infected area several times a day.   See your doctor for a follow-up visit as told.  GET HELP RIGHT AWAY IF:   You are tired or confused.   You throw up (vomit).   You have watery poop (diarrhea).   You feel ill and have muscle aches.   You have a fever.  MAKE SURE YOU:   Understand these instructions.   Will watch your condition.   Will get help right away if you are not doing well or get worse.  Document Released: 03/02/2008 Document Revised: 09/03/2011 Document Reviewed: 08/16/2009 [employer] Patient Information 2012 [employer]. 

## 2011-12-24 ENCOUNTER — Ambulatory Visit: Payer: Medicaid Other

## 2011-12-31 ENCOUNTER — Ambulatory Visit: Payer: Medicaid Other

## 2012-01-04 LAB — AFB CULTURE, BLOOD

## 2012-01-05 ENCOUNTER — Telehealth: Payer: Self-pay | Admitting: *Deleted

## 2012-01-05 ENCOUNTER — Other Ambulatory Visit: Payer: Medicaid Other

## 2012-01-05 DIAGNOSIS — B20 Human immunodeficiency virus [HIV] disease: Secondary | ICD-10-CM

## 2012-01-05 LAB — COMPREHENSIVE METABOLIC PANEL
ALT: <8 U/L (ref 0–53)
AST: 8 U/L (ref 0–37)
Alkaline Phosphatase: 77 U/L (ref 39–117)
CO2: 26 mEq/L (ref 19–32)
Creat: 1.41 mg/dL — ABNORMAL HIGH (ref 0.50–1.35)
Sodium: 133 mEq/L — ABNORMAL LOW (ref 135–145)
Total Bilirubin: 0.5 mg/dL (ref 0.3–1.2)
Total Protein: 7.9 g/dL (ref 6.0–8.3)

## 2012-01-05 LAB — CBC
HCT: 33.5 % — ABNORMAL LOW (ref 39.0–52.0)
Hemoglobin: 10.4 g/dL — ABNORMAL LOW (ref 13.0–17.0)
MCHC: 31 g/dL (ref 30.0–36.0)
Platelets: 214 10*3/uL (ref 150–400)
RDW: 15.4 % (ref 11.5–15.5)
WBC: 5 10*3/uL (ref 4.0–10.5)

## 2012-01-05 NOTE — Telephone Encounter (Signed)
Patient came in for labs and wanted his surgery site looked at.  Advised to call his surgeon, he agreed to do this. Wendall Mola CMA

## 2012-01-06 LAB — HIV-1 RNA QUANT-NO REFLEX-BLD: HIV-1 RNA Quant, Log: <1.3 {Log} (ref <1.30)

## 2012-01-11 ENCOUNTER — Telehealth: Payer: Self-pay | Admitting: *Deleted

## 2012-01-11 NOTE — Telephone Encounter (Signed)
Has swollen lymph node in neck. Offered appt 01/12/12 - not able  - will see 01/13/12 8:15AM Dr Dierdre Searles. [facility] Kerline Trahan RN 01/11/12 3:20PM

## 2012-01-12 ENCOUNTER — Encounter: Payer: Medicaid Other | Admitting: Internal Medicine

## 2012-01-13 ENCOUNTER — Encounter: Payer: Self-pay | Admitting: Internal Medicine

## 2012-01-13 ENCOUNTER — Ambulatory Visit (INDEPENDENT_AMBULATORY_CARE_PROVIDER_SITE_OTHER): Payer: Medicaid Other | Admitting: Internal Medicine

## 2012-01-13 DIAGNOSIS — E785 Hyperlipidemia, unspecified: Secondary | ICD-10-CM

## 2012-01-13 DIAGNOSIS — Z1211 Encounter for screening for malignant neoplasm of colon: Secondary | ICD-10-CM

## 2012-01-13 DIAGNOSIS — L0390 Cellulitis, unspecified: Secondary | ICD-10-CM

## 2012-01-13 NOTE — Assessment & Plan Note (Signed)
Left axilla cellulitis completely resolved. Well healed incision noted.

## 2012-01-13 NOTE — Assessment & Plan Note (Signed)
Patient never had colonoscopy.  - will send the referral.

## 2012-01-13 NOTE — Patient Instructions (Signed)
1. Follow up with PCP in 1 month( already has an appt) 2. Continue to follow up with ID.

## 2012-01-13 NOTE — Assessment & Plan Note (Signed)
Patient has had elevated TG and VLDL. He states that he would like to try lifestyle changes including low fat, low cholesterol diety and exercise to lower his lipid.   - continue lifestyle change - will recheck your lipid panel in 3- 6 months.

## 2012-01-13 NOTE — Progress Notes (Signed)
Patient ID: EUEL CASTILE, male   DOB: [birth year], 51 y.o.   MRN: [id number] Patient ID: BENCE TRAPP, male   DOB: [birth year], 51 y.o.   MRN: [id number]  HPI:   Patient is a 51 year old male with a past medical history listed below presents to the outpatient clinic for follow up visit after he was admitted for left axilla cellulitis, this was initially treated with IV antibiotics and patient was discharged with oral amoxicillin and doxycycline on 12/15/11. Patient had follow up visit on 12/22/11 and was given additional week supply of above ABX. Patient reports completing antibiotics and is here for follow up visit.  Denies any fever or chills. No headache or sore throat. No shortness of breath or dyspnea on exertion. No chest pain, chest pressure or palpitation No nausea, vomiting, or abdominal pain. No melena, diarrhea or incontinence. No muscle weakness.                   Denies depression. No appetite or weight changes.  Health maintenance 1. Tetanus shot, patient states that he had it in 2005. 2. Colonoscopy.    Never had it, will refer  Past Medical History  Diagnosis Date  . Hypertension   . Renal insufficiency     hydrated cr 1.5 gfr 48  . HIV (human immunodeficiency virus infection)   . Hypogonadism male    Current Outpatient Prescriptions  Medication Sig Dispense Refill  . amLODipine (NORVASC) 10 MG tablet Take 1 tablet (10 mg total) by mouth daily.  30 tablet  11  . cyanocobalamin (,VITAMIN B-12,) 1000 MCG/ML injection Inject 1 mL daily x2 days ,then Inject 1ml weekly x 4 weeks, then monthly  10 mL  6  . dapsone 100 MG tablet Take 1 tablet (100 mg total) by mouth daily.  30 tablet  6  . efavirenz-emtrictabine-tenofovir (ATRIPLA) 600-200-300 MG per tablet Take 1 tablet by mouth at bedtime.  30 tablet  11  . labetalol (NORMODYNE) 200 MG tablet Take 200 mg by mouth 2 (two) times daily.      . Testosterone 20.25 MG/1.25GM (1.62%) GEL Place 100 mg onto the skin every morning.  1.25 g   3   Family History  Problem Relation Age of Onset  . Adopted: Yes   History   Social History  . Marital Status: Single    Spouse Name: N/A    Number of Children: N/A  . Years of Education: N/A   Social History Main Topics  . Smoking status: Never Smoker   . Smokeless tobacco: Never Used  . Alcohol Use: 0.5 oz/week    1 drink(s) per week     occasionallt  . Drug Use: No  . Sexually Active: Yes -- Male partner(s)    Birth Control/ Protection: Condom     refused condoms   Other Topics Concern  . None   Social History Narrative  . None   Review of Systems: See HPI Objective:  Physical Exam: Filed Vitals:   01/13/12 0845  BP: 126/74  Pulse: 73  Temp: 98.7 F (37.1 C)  TempSrc: Oral  Weight: 234 lb 9.6 oz (106.414 kg)  SpO2: 100%    General: diffused LAN. Lungs:  normal respiratory effort, no accessory muscle use, normal breath sounds, no crackles, and no wheezes. Heart:  normal rate, regular rhythm, no murmurs, no gallop, and no rub.   Abdomen:  soft, non-tender, normal bowel sounds, no distention, no guarding, no rebound tenderness, no hepatomegaly, and no splenomegaly.  Extremities:  No cyanosis, clubbing, edema.  -Left axilla: incision well healed. No erythema, swelling or tenderness. No Drainage noted.  Neurologic:  alert & oriented X3, nonfocal exam Assessment & Plan:

## 2012-01-16 LAB — AFB CULTURE WITH SMEAR (NOT AT ARMC): Acid Fast Smear: NONE SEEN

## 2012-01-19 ENCOUNTER — Ambulatory Visit: Payer: Medicaid Other | Admitting: Internal Medicine

## 2012-01-19 ENCOUNTER — Ambulatory Visit (INDEPENDENT_AMBULATORY_CARE_PROVIDER_SITE_OTHER): Payer: Medicaid Other | Admitting: Internal Medicine

## 2012-01-19 ENCOUNTER — Encounter: Payer: Self-pay | Admitting: Internal Medicine

## 2012-01-19 VITALS — BP 132/84 | HR 75 | Temp 98.2°F | Ht 72.0 in | Wt 231.0 lb

## 2012-01-19 DIAGNOSIS — B20 Human immunodeficiency virus [HIV] disease: Secondary | ICD-10-CM

## 2012-01-19 DIAGNOSIS — E785 Hyperlipidemia, unspecified: Secondary | ICD-10-CM

## 2012-01-19 NOTE — Progress Notes (Signed)
Patient ID: Patrick [name], male   DOB: 1961-04-10, 51 y.o.   MRN: [id number]  INFECTIOUS DISEASE PROGRESS NOTE    Subjective: Patrick [name] is in for his routine visit. He was rehospitalized again last month with an acute flare of his diffuse adenopathy. Left axillary lymph node biopsy again showed only reactive changes. Postoperatively he developed some cellulitis at the incision site but it has now resolved after a course of amoxicillin and doxycycline. He is feeling better. He has not missed any doses of his Atripla.  Objective: Temp: 98.2 F (36.8 C) (04/23 1055) Temp src: Oral (04/23 1055) BP: 132/84 mmHg (04/23 1055) Pulse Rate: 75  (04/23 1055)  General: He is in good spirits Nodes: He has some persistent soft, rubbery nodes in his neck. He has one firm a left supraclavicular nodule. Skin: His left axillary incision site has healed and the cellulitis has resolved Lungs: Clear Cor: Regular S1 and S2 and no murmurs Abdomen: Obese, soft nontender   Lab Results HIV 1 RNA Quant (copies/mL)  Date Value  01/05/2012 <20   11/22/2011 <20   10/06/2011 <20      CD4 T Cell Abs (cmm)  Date Value  01/05/2012 210*  11/22/2011 110*  10/06/2011 220*   Lab Results  Component Value Date   CREATININE 1.41* 01/05/2012   BUN 18 01/05/2012   NA 133* 01/05/2012   K 4.8 01/05/2012   CL 99 01/05/2012   CO2 26 01/05/2012      Assessment: His CD4 count is back up over 200 and his viral load remains undetectable. I will continue Atripla but we'll need to watch closely as his GFR is 58. If it drops below 50 we'll need to separate the 3 drugs and renally dose them.  He continues to have intermittent problems with fever and increased her generalized adenopathy. Immune reconstitution usually does not cycle on and off but I do not know of any better and explanation. I see no evidence of lymphoma.  Plan: 1. Continue Atripla 2. Monitor blood pressure and renal function closely 3. Dental referral 4. Colonoscopy is  being arranged through his primary care provider 5. Return in 3 months   Cliffton Asters, MD [facility] [phone] pager   [phone] cell 01/19/2012, 11:48 AM

## 2012-02-17 ENCOUNTER — Encounter: Payer: Medicaid Other | Admitting: Internal Medicine

## 2012-02-19 ENCOUNTER — Emergency Department (HOSPITAL_COMMUNITY)
Admission: EM | Admit: 2012-02-19 | Discharge: 2012-02-20 | Disposition: A | Discharge status: Home / Self Care | Payer: Medicaid Other | Attending: Emergency Medicine | Admitting: Emergency Medicine

## 2012-02-19 ENCOUNTER — Encounter (HOSPITAL_COMMUNITY): Payer: Self-pay | Admitting: *Deleted

## 2012-02-19 DIAGNOSIS — R599 Enlarged lymph nodes, unspecified: Secondary | ICD-10-CM | POA: Insufficient documentation

## 2012-02-19 DIAGNOSIS — I10 Essential (primary) hypertension: Secondary | ICD-10-CM | POA: Insufficient documentation

## 2012-02-19 DIAGNOSIS — R51 Headache: Secondary | ICD-10-CM | POA: Insufficient documentation

## 2012-02-19 DIAGNOSIS — N289 Disorder of kidney and ureter, unspecified: Secondary | ICD-10-CM | POA: Insufficient documentation

## 2012-02-19 DIAGNOSIS — R059 Cough, unspecified: Secondary | ICD-10-CM | POA: Insufficient documentation

## 2012-02-19 DIAGNOSIS — R110 Nausea: Secondary | ICD-10-CM | POA: Insufficient documentation

## 2012-02-19 DIAGNOSIS — R05 Cough: Secondary | ICD-10-CM | POA: Insufficient documentation

## 2012-02-19 DIAGNOSIS — J039 Acute tonsillitis, unspecified: Secondary | ICD-10-CM | POA: Insufficient documentation

## 2012-02-19 DIAGNOSIS — Z21 Asymptomatic human immunodeficiency virus [HIV] infection status: Secondary | ICD-10-CM | POA: Insufficient documentation

## 2012-02-19 DIAGNOSIS — E291 Testicular hypofunction: Secondary | ICD-10-CM | POA: Insufficient documentation

## 2012-02-19 DIAGNOSIS — R509 Fever, unspecified: Secondary | ICD-10-CM | POA: Insufficient documentation

## 2012-02-19 DIAGNOSIS — Z79899 Other long term (current) drug therapy: Secondary | ICD-10-CM | POA: Insufficient documentation

## 2012-02-19 LAB — COMPREHENSIVE METABOLIC PANEL
AST: 16 U/L (ref 0–37)
Albumin: 3.2 g/dL — ABNORMAL LOW (ref 3.5–5.2)
Alkaline Phosphatase: 78 U/L (ref 39–117)
BUN: 23 mg/dL (ref 6–23)
CO2: 24 mEq/L (ref 19–32)
Chloride: 96 mEq/L (ref 96–112)
GFR calc non Af Amer: 42 mL/min — ABNORMAL LOW (ref >90)
Potassium: 4.9 mEq/L (ref 3.5–5.1)
Total Bilirubin: 0.5 mg/dL (ref 0.3–1.2)

## 2012-02-19 LAB — URINALYSIS, ROUTINE W REFLEX MICROSCOPIC
Glucose, UA: NEGATIVE mg/dL
Ketones, ur: 15 mg/dL — AB
Leukocytes, UA: NEGATIVE
Nitrite: NEGATIVE
Specific Gravity, Urine: 1.025 (ref 1.005–1.030)
pH: 5.5 (ref 5.0–8.0)

## 2012-02-19 LAB — DIFFERENTIAL
Basophils Absolute: 0 10*3/uL (ref 0.0–0.1)
Basophils Relative: 1 % (ref 0–1)
Lymphocytes Relative: 28 % (ref 12–46)
Monocytes Absolute: 0.8 10*3/uL (ref 0.1–1.0)
Monocytes Relative: 16 % — ABNORMAL HIGH (ref 3–12)
Neutro Abs: 2.7 10*3/uL (ref 1.7–7.7)
Neutrophils Relative %: 54 % (ref 43–77)

## 2012-02-19 LAB — CBC
HCT: 30.4 % — ABNORMAL LOW (ref 39.0–52.0)
Hemoglobin: 10.2 g/dL — ABNORMAL LOW (ref 13.0–17.0)
MCHC: 33.6 g/dL (ref 30.0–36.0)
WBC: 5 10*3/uL (ref 4.0–10.5)

## 2012-02-19 LAB — URINE MICROSCOPIC-ADD ON

## 2012-02-19 NOTE — ED Notes (Signed)
The pt has many complaints with a high temp nv  Headache aching all over his body.Patrick [name]  He took tylenol 1000mg  at [facility]

## 2012-02-20 ENCOUNTER — Emergency Department (HOSPITAL_COMMUNITY): Payer: Medicaid Other

## 2012-02-20 LAB — RAPID STREP SCREEN (MED CTR MEBANE ONLY): Streptococcus, Group A Screen (Direct): NEGATIVE

## 2012-02-20 MED ORDER — CLINDAMYCIN HCL 300 MG PO CAPS: 300.0000 mg | ORAL_CAPSULE | Freq: Four times a day (QID) | ORAL | Status: DC

## 2012-02-20 MED ORDER — SODIUM CHLORIDE 0.9 % IV BOLUS (SEPSIS)
1000.0000 mL | Freq: Once | INTRAVENOUS | Status: AC
  Administered 2012-02-20: 1000 mL via INTRAVENOUS

## 2012-02-20 MED ORDER — CLINDAMYCIN PHOSPHATE 600 MG/50ML IV SOLN
600.0000 mg | Freq: Once | INTRAVENOUS | Status: AC
  Administered 2012-02-20: 600 mg via INTRAVENOUS
  Filled 2012-02-20: qty 50

## 2012-02-20 NOTE — Discharge Instructions (Signed)
Return immediately for worsening fever, chills, weakness, visual changes, neck stiffness or any concerns. Call your doctor's office to arrange follow up appointment on Tuesday or Wednesday.   Tonsillitis Tonsils are lumps of lymphoid tissues at the back of the throat. Each tonsil has 20 crevices (crypts). Tonsils help fight nose and throat infections and keep infection from spreading to other parts of the body for the first 18 months of life. Tonsillitis is an infection of the throat that causes the tonsils to become red, tender, and swollen. CAUSES Sudden and, if treated, temporary (acute) tonsillitis is usually caused by infection with streptococcal bacteria. Long lasting (chronic) tonsillitis occurs when the crypts of the tonsils become filled with pieces of food and bacteria, which makes it easy for the tonsils to become constantly infected. SYMPTOMS  Symptoms of tonsillitis include:  A sore throat.   White patches on the tonsils.   Fever.   Tiredness.  DIAGNOSIS Tonsillitis can be diagnosed through a physical exam. Diagnosis can be confirmed with the results of lab tests, including a throat culture. TREATMENT  The goals of tonsillitis treatment include the reduction of the severity and duration of symptoms, prevention of associated conditions, and prevention of disease transmission. Tonsillitis caused by bacteria can be treated with antibiotics. Usually, treatment with antibiotics is started before the cause of the tonsillitis is known. However, if it is determined that the cause is not bacterial, antibiotics will not treat the tonsillitis. If attacks of tonsillitis are severe and frequent, your caregiver may recommend surgery to remove the tonsils (tonsillectomy). HOME CARE INSTRUCTIONS   Rest as much as possible and get plenty of sleep.   Drink plenty of fluids. While the throat is very sore, eat soft foods or liquids, such as sherbet, soups, or instant breakfast drinks.   Eat  frozen ice pops.   Older children and adults may gargle with a warm or cold liquid to help soothe the throat. Mix 1 teaspoon of salt in 1 cup of water.   Other family members who also develop a sore throat or fever should have a medical exam or throat culture.   Only take over-the-counter or prescription medicines for pain, discomfort, or fever as directed by your caregiver.   If you are given antibiotics, take them as directed. Finish them even if you start to feel better.  SEEK MEDICAL CARE IF:   Your baby is older than 3 months with a rectal temperature of 100.5 F (38.1 C) or higher for more than 1 day.   Large, tender lumps develop in your neck.   A rash develops.   Green, yellow-brown, or bloody substance is coughed up.   You are unable to swallow liquids or food for 24 hours.   Your child is unable to swallow food or liquids for 12 hours.  SEEK IMMEDIATE MEDICAL CARE IF:   You develop any new symptoms such as vomiting, severe headache, stiff neck, chest pain, or trouble breathing or swallowing.   You have severe throat pain along with drooling or voice changes.   You have severe pain, unrelieved with recommended medications.   You are unable to fully open the mouth.   You develop redness, swelling, or severe pain anywhere in the neck.   You have a fever.   Your baby is older than 3 months with a rectal temperature of 102 F (38.9 C) or higher.   Your baby is 36 months old or younger with a rectal temperature of 100.4 F (38  C) or higher.  MAKE SURE YOU:   Understand these instructions.   Will watch your condition.   Will get help right away if you are not doing well or get worse.  Document Released: 06/24/2005 Document Revised: 09/03/2011 Document Reviewed: 11/20/2010 [facility] Patient Information 2012 [place], [place].

## 2012-02-20 NOTE — ED Notes (Signed)
Patient with fever and just not feeling good.  Patient states that he has had a cough for last few days.  Patient states that he has been sick for over a few days.

## 2012-02-20 NOTE — ED Provider Notes (Signed)
History     CSN: [id number]  Arrival date & time 02/19/12  2229   First MD Initiated Contact with Patient 02/20/12 [phone]      Chief Complaint  Patient presents with  . multiple symptoms     (Consider location/radiation/quality/duration/timing/severity/associated sxs/prior treatment) HPI Pt has had 2-3 days of fever, generalized myalgias, mild HA, sore throat and anterior cervical lymphadenopathy. No neck pain or stiffness. + mild nausea. No abd pain or urinary symptoms. Pt states his last CD4 count > 200 Past Medical History  Diagnosis Date  . Hypertension   . Renal insufficiency     hydrated cr 1.5 gfr 48  . HIV (human immunodeficiency virus infection)   . Hypogonadism male     Past Surgical History  Procedure Date  . Lithotripsy 2010  . Appendectomy 2001    via midline incision  . Lymph node biopsy 2010, 2011    bilateral... by dr Derrell Lolling    Family History  Problem Relation Age of Onset  . Adopted: Yes    History  Substance Use Topics  . Smoking status: Never Smoker   . Smokeless tobacco: Never Used  . Alcohol Use: 0.5 oz/week    1 drink(s) per week     occasionallt      Review of Systems  Constitutional: Positive for fever and chills.  HENT: Positive for sore throat. Negative for trouble swallowing, neck pain and neck stiffness.   Respiratory: Positive for cough. Negative for shortness of breath and wheezing.   Cardiovascular: Negative for chest pain.  Gastrointestinal: Positive for nausea. Negative for vomiting, abdominal pain and diarrhea.  Genitourinary: Negative for dysuria.  Musculoskeletal: Negative for back pain.  Skin: Negative for rash and wound.  Neurological: Negative for weakness, light-headedness, numbness and headaches.    Allergies  Codeine and Sulfonamide derivatives  Home Medications   Current Outpatient Rx  Name Route Sig Dispense Refill  . AMLODIPINE BESYLATE 10 MG PO TABS Oral Take 10 mg by mouth every morning.    . TEARS  RENEWED OP Both Eyes Place 1 drop into both eyes 2 (two) times daily as needed. For dry eyes only when wearing contacts    . CETIRIZINE HCL 10 MG PO TABS Oral Take 10 mg by mouth daily as needed. For allergy symptoms    . CYANOCOBALAMIN 1000 MCG/ML IJ SOLN  1,000 mcg every 7 (seven) days. Takes on Monday    . DAPSONE 100 MG PO TABS Oral Take 100 mg by mouth every morning.    Marland Kitchen EFAVIRENZ-EMTRICITAB-TENOFOVIR 600-200-300 MG PO TABS Oral Take 1 tablet by mouth at bedtime.    Marland Kitchen LABETALOL HCL 200 MG PO TABS Oral Take 200 mg by mouth 2 (two) times daily.    Marland Kitchen SALINE NASAL SPRAY 0.65 % NA SOLN Nasal Place 1 spray into the nose daily as needed. For nasal congestion    . TESTOSTERONE 20.25 MG/1.25GM (1.62%) TD GEL Transdermal Place 100 mg onto the skin every morning. 1.25 g 3  . CLINDAMYCIN HCL 300 MG PO CAPS Oral Take 1 capsule (300 mg total) by mouth 4 (four) times daily. 40 capsule 0    BP 134/78  Pulse 98  Temp(Src) 101.3 F (38.5 C) (Oral)  Resp 25  SpO2 94%  Physical Exam  Nursing note and vitals reviewed. Constitutional: He is oriented to person, place, and time. He appears well-developed and well-nourished. No distress.  HENT:  Head: Normocephalic and atraumatic.  Mouth/Throat: Oropharyngeal exudate present.  Erythematous tonsils with exudate noted, no masses  Eyes: EOM are normal. Pupils are equal, round, and reactive to light.  Neck: Normal range of motion. Neck supple.       No meningismus   Cardiovascular: Normal rate and regular rhythm.   Pulmonary/Chest: Effort normal and breath sounds normal. No respiratory distress. He has no wheezes. He has no rales.  Abdominal: Soft. Bowel sounds are normal. There is no tenderness. There is no rebound and no guarding.  Musculoskeletal: Normal range of motion. He exhibits no edema and no tenderness.  Lymphadenopathy:    He has cervical adenopathy.  Neurological: He is alert and oriented to person, place, and time.       5/5 motor,  sensation intact  Skin: Skin is warm and dry. No rash noted. No erythema.  Psychiatric: He has a normal mood and affect. His behavior is normal.    ED Course  Procedures (including critical care time)  Labs Reviewed  URINALYSIS, ROUTINE W REFLEX MICROSCOPIC - Abnormal; Notable for the following:    Color, Urine AMBER (*) BIOCHEMICALS MAY BE AFFECTED BY COLOR   APPearance TURBID (*)    Hgb urine dipstick MODERATE (*)    Bilirubin Urine SMALL (*)    Ketones, ur 15 (*)    Protein, ur >300 (*)    All other components within normal limits  CBC - Abnormal; Notable for the following:    RBC 3.39 (*)    Hemoglobin 10.2 (*)    HCT 30.4 (*)    RDW 15.7 (*)    Platelets 114 (*) PLATELET COUNT CONFIRMED BY SMEAR   All other components within normal limits  DIFFERENTIAL - Abnormal; Notable for the following:    Monocytes Relative 16 (*)    All other components within normal limits  COMPREHENSIVE METABOLIC PANEL - Abnormal; Notable for the following:    Sodium 130 (*)    Glucose, Bld 270 (*)    Creatinine, Ser 1.81 (*)    Albumin 3.2 (*)    GFR calc non Af Amer 42 (*)    GFR calc Af Amer 49 (*)    All other components within normal limits  URINE MICROSCOPIC-ADD ON - Abnormal; Notable for the following:    Bacteria, UA FEW (*)    Casts HYALINE CASTS (*) GRANULAR CAST   All other components within normal limits  RAPID STREP SCREEN  CULTURE, BLOOD (ROUTINE X 2)  CULTURE, BLOOD (ROUTINE X 2)   Dg Chest 2 View  02/20/2012  *RADIOLOGY REPORT*  Clinical Data: Fever, nausea and vomiting, sore throat.  CHEST - 2 VIEW  Comparison: 11/21/2011  Findings: Shallow inspiration.  Heart size and pulmonary vascularity are normal.  No focal airspace consolidation in the lungs.  No blunting of costophrenic angles.  No pneumothorax. Surgical clips in the left axilla.  Stable appearance since previous study.  IMPRESSION: Shallow inspiration.  No evidence of active pulmonary disease.  Original Report  Authenticated By: Marlon Pel, M.D.     1. Tonsillitis with exudate   2. Fever       MDM   Pt feeling better and resting comfortably. HR is in 90's. Discussed with internal medicine resident who agrees with plan to d/c on abx and follow up in several days in outpatient clinic. Patient cautioned to return immediately for any worsening in symptoms.        Loren Racer, MD 02/20/12 [phone]

## 2012-02-22 ENCOUNTER — Inpatient Hospital Stay (HOSPITAL_COMMUNITY)
Admission: EM | Admit: 2012-02-22 | Discharge: 2012-02-26 | DRG: 977 | Disposition: A | Discharge status: Home / Self Care | Payer: Medicaid Other | Attending: Internal Medicine | Admitting: Internal Medicine

## 2012-02-22 ENCOUNTER — Other Ambulatory Visit: Payer: Self-pay | Admitting: Internal Medicine

## 2012-02-22 ENCOUNTER — Emergency Department (HOSPITAL_COMMUNITY): Payer: Medicaid Other

## 2012-02-22 ENCOUNTER — Encounter (HOSPITAL_COMMUNITY): Payer: Self-pay | Admitting: Emergency Medicine

## 2012-02-22 DIAGNOSIS — R6510 Systemic inflammatory response syndrome (SIRS) of non-infectious origin without acute organ dysfunction: Secondary | ICD-10-CM | POA: Diagnosis present

## 2012-02-22 DIAGNOSIS — I129 Hypertensive chronic kidney disease with stage 1 through stage 4 chronic kidney disease, or unspecified chronic kidney disease: Secondary | ICD-10-CM | POA: Diagnosis present

## 2012-02-22 DIAGNOSIS — D649 Anemia, unspecified: Secondary | ICD-10-CM | POA: Diagnosis present

## 2012-02-22 DIAGNOSIS — E860 Dehydration: Secondary | ICD-10-CM

## 2012-02-22 DIAGNOSIS — E119 Type 2 diabetes mellitus without complications: Secondary | ICD-10-CM | POA: Diagnosis present

## 2012-02-22 DIAGNOSIS — R7309 Other abnormal glucose: Secondary | ICD-10-CM | POA: Diagnosis present

## 2012-02-22 DIAGNOSIS — E871 Hypo-osmolality and hyponatremia: Secondary | ICD-10-CM | POA: Diagnosis present

## 2012-02-22 DIAGNOSIS — D696 Thrombocytopenia, unspecified: Secondary | ICD-10-CM | POA: Diagnosis present

## 2012-02-22 DIAGNOSIS — N179 Acute kidney failure, unspecified: Secondary | ICD-10-CM | POA: Diagnosis present

## 2012-02-22 DIAGNOSIS — I10 Essential (primary) hypertension: Secondary | ICD-10-CM | POA: Diagnosis present

## 2012-02-22 DIAGNOSIS — R509 Fever, unspecified: Secondary | ICD-10-CM

## 2012-02-22 DIAGNOSIS — N189 Chronic kidney disease, unspecified: Secondary | ICD-10-CM | POA: Diagnosis present

## 2012-02-22 DIAGNOSIS — E139 Other specified diabetes mellitus without complications: Secondary | ICD-10-CM

## 2012-02-22 DIAGNOSIS — E538 Deficiency of other specified B group vitamins: Secondary | ICD-10-CM | POA: Diagnosis present

## 2012-02-22 DIAGNOSIS — N289 Disorder of kidney and ureter, unspecified: Secondary | ICD-10-CM

## 2012-02-22 DIAGNOSIS — D61818 Other pancytopenia: Secondary | ICD-10-CM | POA: Diagnosis present

## 2012-02-22 DIAGNOSIS — B20 Human immunodeficiency virus [HIV] disease: Secondary | ICD-10-CM | POA: Diagnosis present

## 2012-02-22 DIAGNOSIS — R599 Enlarged lymph nodes, unspecified: Secondary | ICD-10-CM | POA: Diagnosis present

## 2012-02-22 HISTORY — DX: Anemia, unspecified: D64.9

## 2012-02-22 HISTORY — DX: Cardiac murmur, unspecified: R01.1

## 2012-02-22 HISTORY — DX: Calculus of kidney: N20.0

## 2012-02-22 HISTORY — DX: Unspecified abdominal hernia without obstruction or gangrene: K46.9

## 2012-02-22 HISTORY — DX: Chronic kidney disease, stage 3 unspecified: N18.30

## 2012-02-22 HISTORY — DX: Chronic kidney disease, stage 3 (moderate): N18.3

## 2012-02-22 MED ORDER — IBUPROFEN 200 MG PO TABS
400.0000 mg | ORAL_TABLET | Freq: Once | ORAL | Status: AC
  Administered 2012-02-22: 400 mg via ORAL
  Filled 2012-02-22: qty 2

## 2012-02-22 MED ORDER — SODIUM CHLORIDE 0.9 % IV BOLUS (SEPSIS)
1000.0000 mL | Freq: Once | INTRAVENOUS | Status: AC
  Administered 2012-02-22: 1000 mL via INTRAVENOUS

## 2012-02-22 NOTE — ED Notes (Signed)
Pt is back in his room from radiology 

## 2012-02-22 NOTE — ED Notes (Addendum)
Pt is currently in Radiology  

## 2012-02-22 NOTE — ED Notes (Addendum)
Patient complaining of shortness of breath, fever, fatigue, and swollen lymph nodes since Wednesday last week.  Patient states that he was seen here on Friday to rule out strep throat (strep was negative); patient states that he was sent home with clindamycin and has been taking the antibiotic, but has not been feeling any better.

## 2012-02-23 ENCOUNTER — Inpatient Hospital Stay (HOSPITAL_COMMUNITY): Payer: Medicaid Other

## 2012-02-23 ENCOUNTER — Encounter (HOSPITAL_COMMUNITY): Payer: Self-pay | Admitting: Internal Medicine

## 2012-02-23 LAB — URINALYSIS, ROUTINE W REFLEX MICROSCOPIC
Glucose, UA: NEGATIVE mg/dL
Ketones, ur: 15 mg/dL — AB
Leukocytes, UA: NEGATIVE
Nitrite: NEGATIVE
Protein, ur: 100 mg/dL — AB
Specific Gravity, Urine: 1.018 (ref 1.005–1.030)
Urobilinogen, UA: 0.2 mg/dL (ref 0.0–1.0)
pH: 5.5 (ref 5.0–8.0)

## 2012-02-23 LAB — BASIC METABOLIC PANEL
BUN: 28 mg/dL — ABNORMAL HIGH (ref 6–23)
BUN: 30 mg/dL — ABNORMAL HIGH (ref 6–23)
CO2: 19 mEq/L (ref 19–32)
CO2: 22 mEq/L (ref 19–32)
Calcium: 8.4 mg/dL (ref 8.4–10.5)
Calcium: 8.5 mg/dL (ref 8.4–10.5)
Chloride: 103 mEq/L (ref 96–112)
Chloride: 98 mEq/L (ref 96–112)
Creatinine, Ser: 2.47 mg/dL — ABNORMAL HIGH (ref 0.50–1.35)
Creatinine, Ser: 2.54 mg/dL — ABNORMAL HIGH (ref 0.50–1.35)
GFR calc Af Amer: 32 mL/min — ABNORMAL LOW (ref >90)
GFR calc Af Amer: 33 mL/min — ABNORMAL LOW (ref >90)
GFR calc non Af Amer: 28 mL/min — ABNORMAL LOW (ref >90)
GFR calc non Af Amer: 29 mL/min — ABNORMAL LOW (ref >90)
Glucose, Bld: 191 mg/dL — ABNORMAL HIGH (ref 70–99)
Glucose, Bld: 201 mg/dL — ABNORMAL HIGH (ref 70–99)
Potassium: 4 mEq/L (ref 3.5–5.1)
Potassium: 4.3 mEq/L (ref 3.5–5.1)
Sodium: 130 mEq/L — ABNORMAL LOW (ref 135–145)
Sodium: 135 mEq/L (ref 135–145)

## 2012-02-23 LAB — MRSA PCR SCREENING: MRSA by PCR: NEGATIVE

## 2012-02-23 LAB — DIFFERENTIAL
Basophils Absolute: 0 10*3/uL (ref 0.0–0.1)
Basophils Absolute: 0 10*3/uL (ref 0.0–0.1)
Basophils Relative: 0 % (ref 0–1)
Basophils Relative: 1 % (ref 0–1)
Eosinophils Absolute: 0.1 10*3/uL (ref 0.0–0.7)
Eosinophils Absolute: 0.1 10*3/uL (ref 0.0–0.7)
Eosinophils Relative: 3 % (ref 0–5)
Eosinophils Relative: 3 % (ref 0–5)
Lymphocytes Relative: 32 % (ref 12–46)
Lymphocytes Relative: 33 % (ref 12–46)
Lymphs Abs: 1.1 10*3/uL (ref 0.7–4.0)
Lymphs Abs: 1.2 10*3/uL (ref 0.7–4.0)
Monocytes Absolute: 0.8 10*3/uL (ref 0.1–1.0)
Monocytes Absolute: 0.8 10*3/uL (ref 0.1–1.0)
Monocytes Relative: 21 % — ABNORMAL HIGH (ref 3–12)
Monocytes Relative: 24 % — ABNORMAL HIGH (ref 3–12)
Neutro Abs: 1.4 10*3/uL — ABNORMAL LOW (ref 1.7–7.7)
Neutro Abs: 1.7 10*3/uL (ref 1.7–7.7)
Neutrophils Relative %: 39 % — ABNORMAL LOW (ref 43–77)
Neutrophils Relative %: 45 % (ref 43–77)

## 2012-02-23 LAB — CBC
HCT: 25.2 % — ABNORMAL LOW (ref 39.0–52.0)
HCT: 25.9 % — ABNORMAL LOW (ref 39.0–52.0)
Hemoglobin: 8.5 g/dL — ABNORMAL LOW (ref 13.0–17.0)
Hemoglobin: 8.7 g/dL — ABNORMAL LOW (ref 13.0–17.0)
MCH: 29.5 pg (ref 26.0–34.0)
MCH: 30 pg (ref 26.0–34.0)
MCHC: 33.6 g/dL (ref 30.0–36.0)
MCHC: 33.7 g/dL (ref 30.0–36.0)
MCV: 87.8 fL (ref 78.0–100.0)
MCV: 89 fL (ref 78.0–100.0)
Platelets: 105 10*3/uL — ABNORMAL LOW (ref 150–400)
Platelets: 97 10*3/uL — ABNORMAL LOW (ref 150–400)
RBC: 2.83 MIL/uL — ABNORMAL LOW (ref 4.22–5.81)
RBC: 2.95 MIL/uL — ABNORMAL LOW (ref 4.22–5.81)
RDW: 16 % — ABNORMAL HIGH (ref 11.5–15.5)
RDW: 16.3 % — ABNORMAL HIGH (ref 11.5–15.5)
WBC: 3.4 10*3/uL — ABNORMAL LOW (ref 4.0–10.5)
WBC: 3.8 10*3/uL — ABNORMAL LOW (ref 4.0–10.5)

## 2012-02-23 LAB — FERRITIN: Ferritin: 682 ng/mL — ABNORMAL HIGH (ref 22–322)

## 2012-02-23 LAB — HEPATIC FUNCTION PANEL
ALT: 20 U/L (ref 0–53)
AST: 21 U/L (ref 0–37)
Albumin: 2.5 g/dL — ABNORMAL LOW (ref 3.5–5.2)
Alkaline Phosphatase: 58 U/L (ref 39–117)
Bilirubin, Direct: <0.1 mg/dL (ref 0.0–0.3)
Total Bilirubin: 0.2 mg/dL — ABNORMAL LOW (ref 0.3–1.2)
Total Protein: 7.1 g/dL (ref 6.0–8.3)

## 2012-02-23 LAB — URINE MICROSCOPIC-ADD ON

## 2012-02-23 LAB — IRON AND TIBC
Iron: <10 ug/dL — ABNORMAL LOW (ref 42–135)
UIBC: 146 ug/dL (ref 125–400)

## 2012-02-23 LAB — RETICULOCYTES
RBC.: 2.83 MIL/uL — ABNORMAL LOW (ref 4.22–5.81)
Retic Count, Absolute: 11.3 10*3/uL — ABNORMAL LOW (ref 19.0–186.0)
Retic Ct Pct: 0.4 % (ref 0.4–3.1)

## 2012-02-23 LAB — NA AND K (SODIUM & POTASSIUM), RAND UR
Potassium Urine: 21 mEq/L
Sodium, Ur: 70 mEq/L

## 2012-02-23 LAB — FOLATE: Folate: 4.2 ng/mL

## 2012-02-23 LAB — LACTIC ACID, PLASMA: Lactic Acid, Venous: 0.6 mmol/L (ref 0.5–2.2)

## 2012-02-23 LAB — VITAMIN B12: Vitamin B-12: 391 pg/mL (ref 211–911)

## 2012-02-23 MED ORDER — DAPSONE 100 MG PO TABS
100.0000 mg | ORAL_TABLET | Freq: Every morning | ORAL | Status: DC
  Administered 2012-02-23 – 2012-02-26 (×4): 100 mg via ORAL
  Filled 2012-02-23 (×4): qty 1

## 2012-02-23 MED ORDER — POLYETHYLENE GLYCOL 3350 17 G PO PACK
17.0000 g | PACK | Freq: Every day | ORAL | Status: DC | PRN
  Filled 2012-02-23: qty 1

## 2012-02-23 MED ORDER — AMLODIPINE BESYLATE 10 MG PO TABS
10.0000 mg | ORAL_TABLET | Freq: Every morning | ORAL | Status: DC
  Administered 2012-02-23 – 2012-02-26 (×4): 10 mg via ORAL
  Filled 2012-02-23 (×4): qty 1

## 2012-02-23 MED ORDER — EMTRICITABINE 200 MG PO CAPS
200.0000 mg | ORAL_CAPSULE | ORAL | Status: DC
  Administered 2012-02-23 – 2012-02-25 (×3): 200 mg via ORAL
  Filled 2012-02-23 (×2): qty 1

## 2012-02-23 MED ORDER — SODIUM CHLORIDE 0.9 % IV BOLUS (SEPSIS)
1000.0000 mL | Freq: Once | INTRAVENOUS | Status: AC
  Administered 2012-02-23: 1000 mL via INTRAVENOUS

## 2012-02-23 MED ORDER — TESTOSTERONE 50 MG/5GM (1%) TD GEL
10.0000 g | Freq: Every morning | TRANSDERMAL | Status: DC
  Administered 2012-02-23 – 2012-02-24 (×2): 10 g via TRANSDERMAL
  Filled 2012-02-23 (×2): qty 10
  Filled 2012-02-23 (×2): qty 5

## 2012-02-23 MED ORDER — TENOFOVIR DISOPROXIL FUMARATE 300 MG PO TABS
300.0000 mg | ORAL_TABLET | ORAL | Status: DC
  Administered 2012-02-23 – 2012-02-25 (×2): 300 mg via ORAL
  Filled 2012-02-23 (×2): qty 1

## 2012-02-23 MED ORDER — EFAVIRENZ 600 MG PO TABS
600.0000 mg | ORAL_TABLET | Freq: Every day | ORAL | Status: DC
  Administered 2012-02-23 – 2012-02-25 (×3): 600 mg via ORAL
  Filled 2012-02-23 (×4): qty 1

## 2012-02-23 MED ORDER — ACETAMINOPHEN 325 MG PO TABS
650.0000 mg | ORAL_TABLET | Freq: Four times a day (QID) | ORAL | Status: DC | PRN
  Administered 2012-02-23 – 2012-02-25 (×4): 650 mg via ORAL
  Filled 2012-02-23 (×4): qty 2

## 2012-02-23 MED ORDER — LABETALOL HCL 200 MG PO TABS
200.0000 mg | ORAL_TABLET | Freq: Two times a day (BID) | ORAL | Status: DC
  Administered 2012-02-23 – 2012-02-26 (×7): 200 mg via ORAL
  Filled 2012-02-23 (×8): qty 1

## 2012-02-23 MED ORDER — ONDANSETRON HCL 4 MG PO TABS: 4.0000 mg | ORAL_TABLET | Freq: Four times a day (QID) | ORAL | Status: DC | PRN

## 2012-02-23 MED ORDER — SODIUM CHLORIDE 0.9 % IV SOLN
INTRAVENOUS | Status: DC
  Administered 2012-02-23: 13:00:00 via INTRAVENOUS
  Administered 2012-02-23: 150 mL/h via INTRAVENOUS
  Administered 2012-02-23: 04:00:00 via INTRAVENOUS

## 2012-02-23 MED ORDER — FERROUS SULFATE 325 (65 FE) MG PO TABS
325.0000 mg | ORAL_TABLET | Freq: Three times a day (TID) | ORAL | Status: DC
  Administered 2012-02-23 – 2012-02-26 (×10): 325 mg via ORAL
  Filled 2012-02-23 (×13): qty 1

## 2012-02-23 MED ORDER — POLYVINYL ALCOHOL 1.4 % OP SOLN
1.0000 [drp] | Freq: Two times a day (BID) | OPHTHALMIC | Status: DC
  Administered 2012-02-23 – 2012-02-26 (×7): 1 [drp] via OPHTHALMIC
  Filled 2012-02-23 (×2): qty 15

## 2012-02-23 MED ORDER — LORATADINE 10 MG PO TABS
10.0000 mg | ORAL_TABLET | Freq: Every day | ORAL | Status: DC
  Administered 2012-02-23 – 2012-02-26 (×4): 10 mg via ORAL
  Filled 2012-02-23 (×4): qty 1

## 2012-02-23 MED ORDER — CYANOCOBALAMIN 1000 MCG/ML IJ SOLN: 1000.0000 ug | INTRAMUSCULAR | Status: DC

## 2012-02-23 MED ORDER — ENOXAPARIN SODIUM 30 MG/0.3ML ~~LOC~~ SOLN
30.0000 mg | SUBCUTANEOUS | Status: DC
  Administered 2012-02-23: 30 mg via SUBCUTANEOUS
  Filled 2012-02-23 (×2): qty 0.3

## 2012-02-23 MED ORDER — POLYETHYLENE GLYCOL 3350 17 G PO PACK
17.0000 g | PACK | Freq: Two times a day (BID) | ORAL | Status: DC
  Administered 2012-02-23 – 2012-02-25 (×3): 17 g via ORAL
  Filled 2012-02-23 (×8): qty 1

## 2012-02-23 MED ORDER — ACETAMINOPHEN 650 MG RE SUPP: 650.0000 mg | Freq: Four times a day (QID) | RECTAL | Status: DC | PRN

## 2012-02-23 MED ORDER — ONDANSETRON HCL 4 MG/2ML IJ SOLN: 4.0000 mg | Freq: Four times a day (QID) | INTRAMUSCULAR | Status: DC | PRN

## 2012-02-23 NOTE — ED Notes (Signed)
Disregard notes on 02:44 for Care Handoff. Charted on the wrong patient

## 2012-02-23 NOTE — H&P (Signed)
Hospital Admission Note Date: 02/23/2012  Patient name: Patrick [name] Medical record number: [id number] Date of birth: 06-Dec-1960 Age: 51 y.o. Gender: male PCP: Janalyn Harder, MD, MD  Medical Service: Internal Medicine Teaching Service-[facility]  Attending physician:  Doneen Poisson   1st Contact:   Kathreen Cosier Pager: [phone] 2nd Contact:   Johnette Abraham  Pager: [phone] After 5 pm or weekends: 1st Contact:      Pager: [phone] 2nd Contact:      Pager: [phone]  Chief Complaint: fever  History of Present Illness: 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 He initially presented to the ED on 02/19/2012 (2 days ago) with complaints of fever, malaise and mild sore throat for 2 days and was subsequently treated for presumptive pharyngitis with Clindamycin.  He returned to ED today and states that he feels no better since starting the antibiotics with continued fevers up to 102 and mild headache stating that this episode feels like his prior admissions for lymphadenopathy and fever. He reports compliance with Atripla therapy, denies chest pain, dyspnea, presyncope or syncope.  He denies nausea, vomiting, hemoptysis, blood in urine or stools or change in bowel habits.  ED course: 2 L of NS in ED, Ibuprofen  Meds:  (Not in a hospital admission) No current facility-administered medications on file prior to encounter.   Current Outpatient Prescriptions on File Prior to Encounter  Medication Sig Dispense Refill  . amLODipine (NORVASC) 10 MG tablet Take 10 mg by mouth every morning.      . Artificial Tear Solution (TEARS RENEWED OP) Place 1 drop into both eyes 2 (two) times daily as needed. For dry eyes only when wearing contacts      .  cetirizine (ZYRTEC) 10 MG tablet Take 10 mg by mouth daily as needed. For allergy symptoms      . clindamycin (CLEOCIN) 300 MG capsule Take 1 capsule (300 mg total) by mouth 4 (four) times daily.  40 capsule  0  . cyanocobalamin (,VITAMIN B-12,) 1000 MCG/ML injection 1,000 mcg every 7 (seven) days. Takes on Monday      . dapsone 100 MG tablet Take 100 mg by mouth every morning.      Marland Kitchen efavirenz-emtrictabine-tenofovir (ATRIPLA) 600-200-300 MG per tablet Take 1 tablet by mouth at bedtime.      Marland Kitchen labetalol (NORMODYNE) 200 MG tablet Take 200 mg by mouth 2 (two) times daily.      . sodium chloride (OCEAN) 0.65 % nasal spray Place 1 spray into the nose daily as needed. For nasal congestion      . Testosterone 20.25 MG/1.25GM (1.62%) GEL Place 100 mg onto the skin every morning.  1.25 g  3    Allergies: Allergies as of 02/22/2012 - Review Complete 02/22/2012  Allergen Reaction Noted  . Codeine Itching, Rash, and Other (See Comments)   . Sulfonamide derivatives Rash 01/01/2009   Past Medical History  Diagnosis Date  . Hypertension   . Renal insufficiency     hydrated cr 1.5 gfr 48  . HIV (human immunodeficiency virus infection)   . Hypogonadism male    Past Surgical History  Procedure Date  . Lithotripsy 2010  . Appendectomy 2001    via midline incision  . Lymph node biopsy 2010, 2011  bilateral... by dr Derrell Lolling   Family History  Problem Relation Age of Onset  . Adopted: Yes   History   Social History  . Marital Status: Single    Spouse Name: N/A    Number of Children: N/A  . Years of Education: N/A   Occupational History  . Not on file.   Social History Main Topics  . Smoking status: Never Smoker   . Smokeless tobacco: Never Used  . Alcohol Use: 0.5 oz/week    1 drink(s) per week     occasionallt  . Drug Use: No  . Sexually Active: Yes -- Male partner(s)    Birth Control/ Protection: Condom     refused condoms   Other Topics Concern  . Not on file   Social  History Narrative  . No narrative on file    Review of Systems: Constitutional:  Endorse fever, chills, diaphoresis, appetite change and fatigue.  HEENT: Endorses sore throat, Denies photophobia, congestion, rhinorrhea, sneezing, mouth sores, trouble swallowing, neck pain, neck stiffness.  Respiratory: Denies SOB, DOE, cough, chest tightness, and wheezing.  Cardiovascular: Denies chest pain, palpitations and leg swelling.  Gastrointestinal: Endorse abdominal distension, Denies nausea, vomiting, abdominal pain, diarrhea, constipation, blood in stool   Genitourinary: Endorses increased frequency, Denies dysuria, urgency, hematuria, flank pain and difficulty urinating.  Musculoskeletal: Denies myalgias, back pain, joint swelling, arthralgias and gait problem.   Skin: Denies new rashes  Neurological: Endorse mild headache, Denies dizziness, seizures, syncope, weakness, light-headedness  Hematological: Endorse adenopathy in neck and underarm region    Physical Exam: Blood pressure 108/83, pulse 92, temperature 98.4 F (36.9 C), temperature source Oral, resp. rate 18, SpO2 98.00%. General: Well-developed, well-nourished, White male, in no acute distress, lying flat on stretcher Head: Normocephalic, atraumatic. Eyes: PERRLA, EOMI, No signs of anemia or jaundice. Throat: Moist mucus membranes, poor dentition, Oropharynx nonerythematous, no exudate appreciated.  Neck: supple, ++ non-tender submandibular and cervical lymphadenopathy, Lungs: Normal respiratory effort. Clear to auscultation bilaterally from apices to bases without crackles or wheezes appreciated. Heart: normal rate, regular rhythm, normal S1 and S2, no gallop, murmur, or rubs appreciated. Abdomen: Obese, BS normoactive. Soft, non-tender Extremities: ++ axilla lymphadenopathy bilaterally,No pretibial edema, distal pedal pulses intact Neurologic: grossly non-focal, alert and oriented x3, appropriate and cooperative throughout  examination. Skin/Lymph: multiple cherry hemangiomas and skin tags throughout especially in bilateral axilla, left axilla with well healed incision w/o erythema or discharge, ++ lymphadenopathy bilateral axilla, groin, and cervical   Lab results: Basic Metabolic Panel:  Basename 02/22/12 2330  NA 130*  K 4.0  CL 98  CO2 19  GLUCOSE 201*  BUN 28*  CREATININE 2.47*  CALCIUM 8.5  MG --  PHOS --   CBC:  Basename 02/22/12 2330  WBC 3.8*  NEUTROABS 1.7  HGB 8.7*  HCT 25.9*  MCV 87.8  PLT 105*    Imaging results:  Dg Chest 2 View  02/22/2012  *RADIOLOGY REPORT*  Clinical Data: Fever.  Congestion.  HIV.  CHEST - 2 VIEW  Comparison: 02/20/2012  Findings: Shallow inspiration.  Normal heart size and pulmonary vascularity.  No focal airspace consolidation in the lungs.  No blunting of costophrenic angles.  No pneumothorax.  Mediastinal contours appear intact.  The surgical clips in the left axilla.  No significant change since previous study.  IMPRESSION: No evidence of active pulmonary disease.  Original Report Authenticated By: Marlon Pel, M.D.    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  1. Fever associated with lymphadenopathy- likely IRIS, no obvious source of infection on history or physical exam.  Pt reports initial sore throat.  He had similar episodes in past at which time a thorough workup including bacterial, fungal, AFB cultures, biopsy of left axillary node without evidence of lymphoma, only showed reactive changes  and empiric antibiotics (azithromycin, doxycycline, amoxicillin, clindamycin) was given in the past. Pt last evaluated by Dr. Orvan Falconer (ID) 01/19/2012 who suggested possibly immune reconstitution with atypical course since it has been recurring over  past 1 year. Patient is afebrile at this time and his hemodynamics are stable without any evidence of sepsis. Plan - Blood culture X 2 - No antibiotics at this time.  - Continue to follow fever curve - ID consult in am if fever persists.  2. HIV- compliant with Atripla and Dapsone.  Plan -Will hold his atripla for now until rehydrated for possible pre-renal azotemia. If kidney function does not improve by tomorrow, will need to renally dose individual drugs of Atripla as per Dr. Blair Dolphin most reset office visit note..  3. Acute on chronic kidney disease- Given poor po intake and rapidity of onset, volume depletion is most likely possibility but medication side effect (Atirpla) is also a consideration. Other causes like HIVAN, hypertensive nephropathy are unlikely given time course. Also glomerulonephritis or ATN in unlikely based on urine analysis findings. Plan -Will hydrate tonight and hold Atripla. RE-evaluate in morning. -monitor urine outpt -check orthostatics  4. Acute Anemia, normocytic- Known history of B12 deficiency anemia and receives monthly B12 injections. Last one was 2 weeks ago. His baseline Hb is 10.5 to 12. He is down to 8.7 today but history not suggestive of GI or other source of blood loss.  Plan -likely hemodilution given that pt received 1L NS and no source of blood loss -recheck CBC in am  -check anemia panel -check FOBT  5. Hyponatremia: has been in low 130s over past 2 months, further decrease likely secondary to volume depletion today as pt reports not drinking or eating much over past several days but increased urine frequency Plan -follow up on orthostatics -check urine electrolytes to further assess -monitor BMET  6. HTN- stable and controlled Plan - continue Norvasc and labetolol  7. Thrombocytopenia- mild decrease from previous numbers. no apparent bleeding.  Plan -Follow trend.  8. DVT Px- reduced dose lovenox for CKD   Signed: Haneef Hallquist,  Ruhi Kopke 02/23/2012, 2:00 AM

## 2012-02-23 NOTE — Progress Notes (Signed)
MD paged to inform pt temp 102.8 and decreased breath sounds O2 Royal was applied  650mg  tylenol given po. New orders received. Ilean Skill LPN

## 2012-02-23 NOTE — ED Provider Notes (Signed)
History    51 year old male with fever. Gradual onset about Friday. Patient was evaluated in emergency room at that time and started on clindamycin for possible strep pharyngitis. This presumptively done because of cervical adenopathy. Patient had no other symptoms or findings of streptococcal pharyngitis. Patient is complaining fatigue continued fever. No other acute complaints. Denies chest pain or shortness of breath. No nausea or vomiting. No diarrhea. No urinary complaints. No rash. Mild headache. No neck pain or neck stiffness. Patient is HIV-positive. Per review her records, last CD4 count was a few months ago and a little over 200. He is on a atripla and reports compliance. Complaining of diffuse adenopathy. This appears chronic in nature though. Previous evaluation including biopsy which  subsequently reactive in nature. Denies any acute change in this.   CSN: [id number]  Arrival date & time 02/22/12  2104   First MD Initiated Contact with Patient 02/22/12 2148      Chief Complaint  Patient presents with  . Fever    (Consider location/radiation/quality/duration/timing/severity/associated sxs/prior treatment) HPI  Past Medical History  Diagnosis Date  . Hypertension   . Renal insufficiency     hydrated cr 1.5 gfr 48  . HIV (human immunodeficiency virus infection)   . Hypogonadism male     Past Surgical History  Procedure Date  . Lithotripsy 2010  . Appendectomy 2001    via midline incision  . Lymph node biopsy 2010, 2011    bilateral... by dr Derrell Lolling    Family History  Problem Relation Age of Onset  . Adopted: Yes    History  Substance Use Topics  . Smoking status: Never Smoker   . Smokeless tobacco: Never Used  . Alcohol Use: 0.5 oz/week    1 drink(s) per week     occasionallt      Review of Systems   Review of symptoms negative unless otherwise noted in HPI.   Allergies  Codeine and Sulfonamide derivatives  Home Medications   Current Outpatient  Rx  Name Route Sig Dispense Refill  . AMLODIPINE BESYLATE 10 MG PO TABS Oral Take 10 mg by mouth every morning.    . TEARS RENEWED OP Both Eyes Place 1 drop into both eyes 2 (two) times daily as needed. For dry eyes only when wearing contacts    . CETIRIZINE HCL 10 MG PO TABS Oral Take 10 mg by mouth daily as needed. For allergy symptoms    . CLINDAMYCIN HCL 300 MG PO CAPS Oral Take 1 capsule (300 mg total) by mouth 4 (four) times daily. 40 capsule 0  . CYANOCOBALAMIN 1000 MCG/ML IJ SOLN  1,000 mcg every 7 (seven) days. Takes on Monday    . DAPSONE 100 MG PO TABS Oral Take 100 mg by mouth every morning.    Marland Kitchen EFAVIRENZ-EMTRICITAB-TENOFOVIR 600-200-300 MG PO TABS Oral Take 1 tablet by mouth at bedtime.    Marland Kitchen LABETALOL HCL 200 MG PO TABS Oral Take 200 mg by mouth 2 (two) times daily.    Marland Kitchen SALINE NASAL SPRAY 0.65 % NA SOLN Nasal Place 1 spray into the nose daily as needed. For nasal congestion    . TESTOSTERONE 20.25 MG/1.25GM (1.62%) TD GEL Transdermal Place 100 mg onto the skin every morning. 1.25 g 3    BP 110/57  Pulse 92  Temp 99 F (37.2 C)  Resp 18  SpO2 96%  Physical Exam  Nursing note and vitals reviewed. Constitutional: He appears well-developed and well-nourished. No distress.  Laying in bed. Diaphoretic. Tired appearing but not toxic. Obese.  HENT:  Head: Normocephalic and atraumatic.  Eyes: Conjunctivae are normal. Pupils are equal, round, and reactive to light. Right eye exhibits no discharge. Left eye exhibits no discharge. No scleral icterus.  Neck: Normal range of motion. Neck supple.  Cardiovascular: Regular rhythm and normal heart sounds.  Exam reveals no gallop and no friction rub.   No murmur heard.      Mildly tachycardic with a regular rhythm. No murmur  Pulmonary/Chest: Effort normal and breath sounds normal. No respiratory distress.  Abdominal: Soft. He exhibits no distension. There is no tenderness.  Musculoskeletal: He exhibits no edema and no tenderness.    Lymphadenopathy:    He has cervical adenopathy.  Neurological: He is alert.  Skin: Skin is warm and dry. No rash noted.  Psychiatric: He has a normal mood and affect. His behavior is normal. Thought content normal.    ED Course  Procedures (including critical care time)  Labs Reviewed  CBC - Abnormal; Notable for the following:    WBC 3.8 (*)    RBC 2.95 (*)    Hemoglobin 8.7 (*)    HCT 25.9 (*)    RDW 16.0 (*)    All other components within normal limits  DIFFERENTIAL - Abnormal; Notable for the following:    Monocytes Relative 21 (*)    All other components within normal limits  BASIC METABOLIC PANEL - Abnormal; Notable for the following:    Sodium 130 (*)    Glucose, Bld 201 (*)    BUN 28 (*)    Creatinine, Ser 2.47 (*)    GFR calc non Af Amer 29 (*)    GFR calc Af Amer 33 (*)    All other components within normal limits  LACTIC ACID, PLASMA  URINALYSIS, ROUTINE W REFLEX MICROSCOPIC  CULTURE, BLOOD (ROUTINE X 2)  CULTURE, BLOOD (ROUTINE X 2)   Dg Chest 2 View  02/22/2012  *RADIOLOGY REPORT*  Clinical Data: Fever.  Congestion.  HIV.  CHEST - 2 VIEW  Comparison: 02/20/2012  Findings: Shallow inspiration.  Normal heart size and pulmonary vascularity.  No focal airspace consolidation in the lungs.  No blunting of costophrenic angles.  No pneumothorax.  Mediastinal contours appear intact.  The surgical clips in the left axilla.  No significant change since previous study.  IMPRESSION: No evidence of active pulmonary disease.  Original Report Authenticated By: Marlon Pel, M.D.     1. Acute on chronic renal failure   2. Dehydration   3. Fever       MDM  50yM with fever. Etiology unclear at this time. Currently being tx'd with clinda for strep although clinically it is not. Pt does have diffuse adenopathy but hx of this and no acute change. Consider malignancy with pancytopenia. Patient with an absolute neutrophil count of 1.7. Although this is is not technically  neutropenic fever, it is trending down. This may be simply viral illness. Given his HIV status though with the last known CD4 count around 200 and continued symptoms despite antibiotics, I feel that the most prudent thing would be admitted for further observation and evaluation. Pt also with acute on chronic renal failure. Baseline appears to be around 1.6. Suspect that this is likely prerenal given fever and insensible losses. Mild hyponatremia also explainable by this. Followed in ID clinic and PCP outpt clinics. Assigned to [employer] although has not had appointment with him yet. Previously Dr Coralee Pesa.  Raeford Razor, MD 02/23/12 [phone]

## 2012-02-23 NOTE — H&P (Signed)
Internal Medicine Attending Admission Note Date: 02/23/2012  Patient name: Patrick [name] Medical record number: [id number] Date of birth: [birth year] Age: 51 y.o. Gender: male  I saw and evaluated the patient. I reviewed the resident's note and I agree with the resident's findings and plan as documented in the resident's note.  Chief Complaint(s): Fever.  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  He was in his usual state of health until late last week when he developed fevers up to 103. Along with this was an increase in his cervical, submandibular, and axillary adenopathy. He presented to the emergency department where he was empirically treated with clindamycin for his pharyngitis. This in spite of the fact that he had no sore throat, and a strep screen which was negative. Over the subsequent 2 days he had no improvement in his fevers and lymphadenopathy. He therefore presented back to the emergency department last night for further evaluation. He currently denies any shakes, chills, sore throat, cough, shortness of breath, dysuria, nausea, vomiting, diarrhea, abdominal pain, or rashes.  Physical Exam - key components related to admission:  Filed Vitals:   02/23/12 0600 02/23/12 0800 02/23/12 0858 02/23/12 0950  BP: 150/58   156/56  Pulse: 100   119  Temp:  102.8 F (39.3 C) 102.3 F (39.1 C)   TempSrc:  Oral Oral   Resp: 18   20  Height:      Weight:      SpO2:    95%   Gen.: Well-developed, well-nourished, man lying comfortably in bed in no acute distress. HEENT: Bilateral cervical and submandibular adenopathy. Axilla: Bilateral adenopathy, well-healed surgical scar on the left. Lungs: Clear to auscultation bilaterally without wheezes, rhonchi, or rales. Heart: Regular rate and  rhythm, with a 2/6 systolic murmur at the left upper sternal border. Abdomen: Soft, nontender, active bowel sounds. Extremities: Without edema.  Lab results:  Basic Metabolic Panel:  Basename 02/23/12 0417 02/22/12 2330  NA 135 130*  K 4.3 4.0  CL 103 98  CO2 22 19  GLUCOSE 191* 201*  BUN 30* 28*  CREATININE 2.54* 2.47*  CALCIUM 8.4 8.5  MG -- --  PHOS -- --   Liver Function Tests:  [facility] 02/23/12 0417  AST 21  ALT 20  ALKPHOS 58  BILITOT 0.2*  PROT 7.1  ALBUMIN 2.5*   CBC:  Basename 02/23/12 0417 02/22/12 2330  WBC 3.4* 3.8*  NEUTROABS 1.4* 1.7  HGB 8.5* 8.7*  HCT 25.2* 25.9*  MCV 89.0 87.8  PLT 97* 105*   Anemia Panel:  Basename 02/23/12 0417  VITAMINB12 --  FOLATE --  FERRITIN --  TIBC --  IRON --  RETICCTPCT 0.4   Urinalysis:  Small bilirubin, 100 protein, small ketones, granular casts.  Imaging results:  Dg Chest 2 View  02/22/2012  *RADIOLOGY REPORT*  Clinical Data: Fever.  Congestion.  HIV.  CHEST - 2 VIEW  Comparison: 02/20/2012  Findings: Shallow inspiration.  Normal heart size and pulmonary vascularity.  No focal airspace consolidation in the lungs.  No blunting of costophrenic angles.  No pneumothorax.  Mediastinal contours appear intact.  The surgical clips in the left axilla.  No significant change since previous study.  IMPRESSION: No evidence of active pulmonary disease.  Original Report Authenticated By: Marlon Pel, M.D.   Assessment & Plan  by Problem:  Patrick [name] is a 51 year old man with a history of HIV, IRIS, and chronic renal insufficiency who presents with recurrent fevers, worsening lymphadenopathy, and acute on chronic renal failure. He has no obvious source of infection to explain his fevers and adenopathy at this time. 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 His acute on chronic renal insufficiency is likely related dehydration associated with his fevers  and decreased appetite.  1) Fevers: Likely related to the immune reconstitution inflammatory syndrome. He will be observed on no antibiotics. We will continue his HIV medications although he may require dose adjustment if his renal insufficiency does not improved.  2) Acute on chronic renal insufficiency: Likely related to dehydration. We will aggressively hydrate him with intravenous normal saline and follow BMPs. If there is no improvement in his kidney function with hydration we will proceed to further evaluation of this acute on chronic renal insufficiency.  3) Disposition: We will observe Patrick [name] overnight with the above therapy. If there is improvement in his renal insufficiency and no obvious source of infection is found we will likely discharge him home on antipyretics with followup in the outpatient clinic.

## 2012-02-23 NOTE — Progress Notes (Signed)
Subjective: Pt febrile to 102 this morning.  Otherwise feeling okay.  Objective: Vital signs in last 24 hours: Filed Vitals:   02/23/12 0800 02/23/12 0858 02/23/12 0950 02/23/12 1023  BP:   156/56   Pulse:   119   Temp: 102.8 F (39.3 C) 102.3 F (39.1 C)  99.6 F (37.6 C)  TempSrc: Oral Oral  Oral  Resp:   20   Height:      Weight:      SpO2:   95%    Weight change:   Intake/Output Summary (Last 24 hours) at 02/23/12 1050 Last data filed at 02/23/12 0900  Gross per 24 hour  Intake    240 ml  Output    150 ml  Net     90 ml   Physical Exam GEN: NAD.  Alert and oriented x 3.  Pleasant, conversant, and cooperative to exam. RESP:  CTAB, no w/r/r CARDIOVASCULAR: RRR, S1, S2, 3/6 HSM across precordium, loudest at LUSB ABDOMEN: soft, NT/ND, NABS EXT: warm and dry, diffuse adenopathy as previously documented. No edema in b/l LE  Lab Results: Basic Metabolic Panel:  Lab 02/23/12 2956 02/22/12 2330  NA 135 130*  K 4.3 4.0  CL 103 98  CO2 22 19  GLUCOSE 191* 201*  BUN 30* 28*  CREATININE 2.54* 2.47*  CALCIUM 8.4 8.5  MG -- --  PHOS -- --   Liver Function Tests:  Lab 02/23/12 0417 02/19/12 2255  AST 21 16  ALT 20 11  ALKPHOS 58 78  BILITOT 0.2* 0.5  PROT 7.1 8.0  ALBUMIN 2.5* 3.2*   CBC:  Lab 02/23/12 0417 02/22/12 2330  WBC 3.4* 3.8*  NEUTROABS 1.4* 1.7  HGB 8.5* 8.7*  HCT 25.2* 25.9*  MCV 89.0 87.8  PLT 97* 105*   Anemia Panel:  Lab 02/23/12 0417  VITAMINB12 --  FOLATE --  FERRITIN --  TIBC --  IRON --  RETICCTPCT 0.4   Urine Drug Screen: Drugs of Abuse     Component Value Date/Time   LABOPIA NONE DETECTED 11/19/2010 0348   COCAINSCRNUR NONE DETECTED 11/19/2010 0348   LABBENZ NONE DETECTED 11/19/2010 0348   AMPHETMU NONE DETECTED 11/19/2010 0348   THCU NONE DETECTED 11/19/2010 0348   LABBARB  Value: NONE DETECTED        DRUG SCREEN FOR MEDICAL PURPOSES ONLY.  IF CONFIRMATION IS NEEDED FOR ANY PURPOSE, NOTIFY LAB WITHIN 5 DAYS.        LOWEST  DETECTABLE LIMITS FOR URINE DRUG SCREEN Drug Class       Cutoff (ng/mL) Amphetamine      1000 Barbiturate      200 Benzodiazepine   200 Tricyclics       300 Opiates          300 Cocaine          300 THC              50 11/19/2010 0348    Urinalysis:  Lab 02/23/12 0300 02/19/12 2246  COLORURINE YELLOW AMBER*  LABSPEC 1.018 1.025  PHURINE 5.5 5.5  GLUCOSEU NEGATIVE NEGATIVE  HGBUR SMALL* MODERATE*  BILIRUBINUR SMALL* SMALL*  KETONESUR 15* 15*  PROTEINUR 100* >300*  UROBILINOGEN 0.2 1.0  NITRITE NEGATIVE NEGATIVE  LEUKOCYTESUR NEGATIVE NEGATIVE    Micro Results: Recent Results (from the past 240 hour(s))  CULTURE, BLOOD (ROUTINE X 2)     Status: Normal (Preliminary result)   Collection Time   02/20/12  1:00 AM  Component Value Range Status Comment   Specimen Description BLOOD LEFT ARM   Final    Special Requests BOTTLES DRAWN AEROBIC AND ANAEROBIC 10CC EACH   Final    Culture  Setup Time [id number]   Final    Culture     Final    Value:        BLOOD CULTURE RECEIVED NO GROWTH TO DATE CULTURE WILL BE HELD FOR 5 DAYS BEFORE ISSUING A FINAL NEGATIVE REPORT   Report Status PENDING   Incomplete   CULTURE, BLOOD (ROUTINE X 2)     Status: Normal (Preliminary result)   Collection Time   02/20/12  1:15 AM      Component Value Range Status Comment   Specimen Description BLOOD RIGHT HAND   Final    Special Requests BOTTLES DRAWN AEROBIC AND ANAEROBIC 10CC EACH   Final    Culture  Setup Time [id number]   Final    Culture     Final    Value:        BLOOD CULTURE RECEIVED NO GROWTH TO DATE CULTURE WILL BE HELD FOR 5 DAYS BEFORE ISSUING A FINAL NEGATIVE REPORT   Report Status PENDING   Incomplete   RAPID STREP SCREEN     Status: Normal   Collection Time   02/20/12  1:30 AM      Component Value Range Status Comment   Streptococcus, Group A Screen (Direct) NEGATIVE  NEGATIVE  Final    Studies/Results: Dg Chest 2 View  02/22/2012  *RADIOLOGY REPORT*  Clinical Data: Fever.  Congestion.   HIV.  CHEST - 2 VIEW  Comparison: 02/20/2012  Findings: Shallow inspiration.  Normal heart size and pulmonary vascularity.  No focal airspace consolidation in the lungs.  No blunting of costophrenic angles.  No pneumothorax.  Mediastinal contours appear intact.  The surgical clips in the left axilla.  No significant change since previous study.  IMPRESSION: No evidence of active pulmonary disease.  Original Report Authenticated By: Marlon Pel, M.D.   Medications: I have reviewed the patient's current medications. Scheduled Meds:   . amLODipine  10 mg Oral q morning - 10a  . cyanocobalamin  1,000 mcg Intramuscular Q Mon  . dapsone  100 mg Oral q morning - 10a  . enoxaparin  30 mg Subcutaneous Q24H  . ibuprofen  400 mg Oral Once  . labetalol  200 mg Oral BID  . loratadine  10 mg Oral Daily  . polyvinyl alcohol  1 drop Both Eyes BID  . sodium chloride  1,000 mL Intravenous Once  . sodium chloride  1,000 mL Intravenous Once  . testosterone  10 g Transdermal q morning - 10a   Continuous Infusions:   . 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  Nothing has been revealing, and IRIS dx has not been a completely satisfactory explanation given waxing/waning course.  Did have fever to 102 this morning, vitals stable.  - f/u blood culture X 2  - No abx at present - Continue to follow fever curve  - consider ID consult  # HIV Compliant with Atripla and Dapsone.  Need to redose atripla drugs given renal failure. - redose HAART meds  # Acute on chronic kidney disease Given poor po intake and rapidity of onset, volume depletion is a  possibility, but medication side effect (Atripla) is also a consideration.  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  FeNa>1. Other causes like HIVAN, hypertensive nephropathy are unlikely given time course.  - monitor urine outpt  - Renal u/s   # Acute Anemia, normocytic Known history of B12 deficiency anemia and receives monthly B12 injections. Last one was 2 weeks ago. His baseline Hb is 10.5 to 12. He is down to 8.7 but history not suggestive of GI or other source of blood loss.  - recheck CBC in am  - check anemia panel  - check FOBT - Fe supplements  # Hyponatremia Resolved with IVF.  Will con't to monitor.  - follow up on orthostatics  - check urine electrolytes to further assess  - monitor BMET   # HTN Stable and controlled   - continue Norvasc and labetolol   # Thrombocytopenia Mild decrease from previous numbers. no apparent bleeding.   - Follow trend  # DVT Px Reduced dose lovenox for CKD   LOS: 1 day   WILDMAN-TOBRINER, BEN 02/23/2012, 10:50 AM

## 2012-02-24 ENCOUNTER — Other Ambulatory Visit: Payer: Self-pay | Admitting: Oncology

## 2012-02-24 DIAGNOSIS — N179 Acute kidney failure, unspecified: Secondary | ICD-10-CM

## 2012-02-24 LAB — BASIC METABOLIC PANEL
BUN: 27 mg/dL — ABNORMAL HIGH (ref 6–23)
CO2: 20 mEq/L (ref 19–32)
Calcium: 8.5 mg/dL (ref 8.4–10.5)
Chloride: 104 mEq/L (ref 96–112)
Creatinine, Ser: 2.14 mg/dL — ABNORMAL HIGH (ref 0.50–1.35)
GFR calc Af Amer: 40 mL/min — ABNORMAL LOW (ref >90)

## 2012-02-24 LAB — CBC
HCT: 25.4 % — ABNORMAL LOW (ref 39.0–52.0)
MCHC: 32.3 g/dL (ref 30.0–36.0)
MCV: 89.8 fL (ref 78.0–100.0)
Platelets: 94 10*3/uL — ABNORMAL LOW (ref 150–400)
RDW: 16.4 % — ABNORMAL HIGH (ref 11.5–15.5)
WBC: 3.3 10*3/uL — ABNORMAL LOW (ref 4.0–10.5)

## 2012-02-24 MED ORDER — SODIUM CHLORIDE 0.9 % IV SOLN
INTRAVENOUS | Status: AC
  Administered 2012-02-24: 16:00:00 via INTRAVENOUS

## 2012-02-24 MED ORDER — FLUTICASONE PROPIONATE 50 MCG/ACT NA SUSP
2.0000 | Freq: Every day | NASAL | Status: DC
  Administered 2012-02-24 – 2012-02-25 (×2): 2 via NASAL
  Filled 2012-02-24: qty 16

## 2012-02-24 MED ORDER — ZOLPIDEM TARTRATE 5 MG PO TABS: 10.0000 mg | ORAL_TABLET | Freq: Once | ORAL | Status: DC

## 2012-02-24 NOTE — Progress Notes (Signed)
Internal Medicine Attending  Date: 02/24/2012  Patrick [name] Medical record number: [id number] Date of birth: [birth year] Age: 51 y.o. Gender: male  I saw and evaluated the Patrick. I reviewed the resident's note by Dr. Abner Greenspan and I agree with the resident's findings and plans as documented in his progress note.  Patrick [name] was seen on rounds this morning. He is without new complaints. He continues to spike fevers up to 102. Results of labs this morning reveal continuing decline in his CBC with pancytopenia. This appears to be of progressive over the last 2 months. As an extensive workup for his fevers and adenopathy have been unrevealing, and thus he has been labeled as having IRIS, this pancytopenia may be a clue to some kind of marrow process that could give [place] insight into Patrick [name] underlying process. I agree with the housestaff's plan to consult hematology/oncology to consider Patrick [name] for a bone marrow biopsy to further assess. I also agree with continued IV fluid hydration in order to treat a presumed prerenal azotemia as the cause of his acute renal failure on chronic renal failure.

## 2012-02-24 NOTE — Progress Notes (Signed)
Utilization review completed.  

## 2012-02-24 NOTE — Progress Notes (Signed)
Inpatient Diabetes Program Recommendations  AACE/ADA: New Consensus Statement on Inpatient Glycemic Control (2009)  Target Ranges:  Prepandial:   less than 140 mg/dL      Peak postprandial:   less than 180 mg/dL (1-2 hours)      Critically ill patients:  140 - 180 mg/dL    Results for AASHIR, UMHOLTZ (MRN [id number]) as of 02/24/2012 11:42  Ref. Range 02/22/2012 23:30  Glucose Latest Range: 70-99 mg/dL 595 (H)   Results for KYSER, WANDEL (MRN [id number]) as of 02/24/2012 11:42  Ref. Range 02/24/2012 05:30  Glucose Latest Range: 70-99 mg/dL 295 (H)     Inpatient Diabetes Program Recommendations Correction (SSI): Please consider adding Novolog Sensitive correction (SSI) scale tid ac + HS.  Note: Will follow. Ambrose Finland RN, MSN, CDE Diabetes Coordinator Inpatient Diabetes Program [phone]

## 2012-02-24 NOTE — Progress Notes (Signed)
Subjective: Pt febrile to 102 this morning.  Otherwise feeling okay.  Objective: Vital signs in last 24 hours: Filed Vitals:   02/23/12 2200 02/23/12 2320 02/24/12 0545 02/24/12 1000  BP: 130/59  137/64 117/69  Pulse: 75  102 94  Temp: 102.8 F (39.3 C) 98.7 F (37.1 C) 101.8 F (38.8 C) 98 F (36.7 C)  TempSrc: Oral Oral Oral Oral  Resp: 17  18 20   Height:      Weight: 240 lb 8 oz (109.09 kg)     SpO2: 97%  100% 100%   Weight change: 2 lb 2.9 oz (0.99 kg)  Intake/Output Summary (Last 24 hours) at 02/24/12 1140 Last data filed at 02/24/12 0900  Gross per 24 hour  Intake   1080 ml  Output    750 ml  Net    330 ml   Physical Exam GEN: NAD.  Alert and oriented x 3.  Pleasant, conversant, and cooperative to exam. RESP:  CTAB, no w/r/r CARDIOVASCULAR: RRR, S1, S2, 3/6 HSM across precordium, loudest at LUSB ABDOMEN: soft, NT/ND, NABS EXT: warm and dry, diffuse adenopathy as previously documented. No edema in b/l LE  Lab Results: Basic Metabolic Panel:  Lab 02/24/12 7829 02/23/12 0417  NA 134* 135  K 4.9 4.3  CL 104 103  CO2 20 22  GLUCOSE 220* 191*  BUN 27* 30*  CREATININE 2.14* 2.54*  CALCIUM 8.5 8.4  MG -- --  PHOS -- --   Liver Function Tests:  Lab 02/23/12 0417 02/19/12 2255  AST 21 16  ALT 20 11  ALKPHOS 58 78  BILITOT 0.2* 0.5  PROT 7.1 8.0  ALBUMIN 2.5* 3.2*   CBC:  Lab 02/24/12 0530 02/23/12 0417 02/22/12 2330  WBC 3.3* 3.4* --  NEUTROABS -- 1.4* 1.7  HGB 8.2* 8.5* --  HCT 25.4* 25.2* --  MCV 89.8 89.0 --  PLT 94* 97* --   Anemia Panel:  Lab 02/23/12 0417  VITAMINB12 391  FOLATE 4.2  FERRITIN 682*  TIBC Not calculated due to Iron <10.  IRON <10*  RETICCTPCT 0.4   Urine Drug Screen: Drugs of Abuse     Component Value Date/Time   LABOPIA NONE DETECTED 11/19/2010 0348   COCAINSCRNUR NONE DETECTED 11/19/2010 0348   LABBENZ NONE DETECTED 11/19/2010 0348   AMPHETMU NONE DETECTED 11/19/2010 0348   THCU NONE DETECTED 11/19/2010 0348   LABBARB  Value: NONE DETECTED        DRUG SCREEN FOR MEDICAL PURPOSES ONLY.  IF CONFIRMATION IS NEEDED FOR ANY PURPOSE, NOTIFY LAB WITHIN 5 DAYS.        LOWEST DETECTABLE LIMITS FOR URINE DRUG SCREEN Drug Class       Cutoff (ng/mL) Amphetamine      1000 Barbiturate      200 Benzodiazepine   200 Tricyclics       300 Opiates          300 Cocaine          300 THC              50 11/19/2010 0348    Urinalysis:  Lab 02/23/12 0300 02/19/12 2246  COLORURINE YELLOW AMBER*  LABSPEC 1.018 1.025  PHURINE 5.5 5.5  GLUCOSEU NEGATIVE NEGATIVE  HGBUR SMALL* MODERATE*  BILIRUBINUR SMALL* SMALL*  KETONESUR 15* 15*  PROTEINUR 100* >300*  UROBILINOGEN 0.2 1.0  NITRITE NEGATIVE NEGATIVE  LEUKOCYTESUR NEGATIVE NEGATIVE    Micro Results: Recent Results (from the past 240 hour(s))  CULTURE, BLOOD (ROUTINE  X 2)     Status: Normal (Preliminary result)   Collection Time   02/20/12  1:00 AM      Component Value Range Status Comment   Specimen Description BLOOD LEFT ARM   Final    Special Requests BOTTLES DRAWN AEROBIC AND ANAEROBIC 10CC EACH   Final    Culture  Setup Time [id number]   Final    Culture     Final    Value:        BLOOD CULTURE RECEIVED NO GROWTH TO DATE CULTURE WILL BE HELD FOR 5 DAYS BEFORE ISSUING A FINAL NEGATIVE REPORT   Report Status PENDING   Incomplete   CULTURE, BLOOD (ROUTINE X 2)     Status: Normal (Preliminary result)   Collection Time   02/20/12  1:15 AM      Component Value Range Status Comment   Specimen Description BLOOD RIGHT HAND   Final    Special Requests BOTTLES DRAWN AEROBIC AND ANAEROBIC 10CC EACH   Final    Culture  Setup Time [id number]   Final    Culture     Final    Value:        BLOOD CULTURE RECEIVED NO GROWTH TO DATE CULTURE WILL BE HELD FOR 5 DAYS BEFORE ISSUING A FINAL NEGATIVE REPORT   Report Status PENDING   Incomplete   RAPID STREP SCREEN     Status: Normal   Collection Time   02/20/12  1:30 AM      Component Value Range Status Comment    Streptococcus, Group A Screen (Direct) NEGATIVE  NEGATIVE  Final   CULTURE, BLOOD (ROUTINE X 2)     Status: Normal (Preliminary result)   Collection Time   02/22/12 11:30 PM      Component Value Range Status Comment   Specimen Description BLOOD LEFT ARM   Final    Special Requests BOTTLES DRAWN AEROBIC AND ANAEROBIC 10CC EA   Final    Culture  Setup Time [id number]   Final    Culture     Final    Value:        BLOOD CULTURE RECEIVED NO GROWTH TO DATE CULTURE WILL BE HELD FOR 5 DAYS BEFORE ISSUING A FINAL NEGATIVE REPORT   Report Status PENDING   Incomplete   CULTURE, BLOOD (ROUTINE X 2)     Status: Normal (Preliminary result)   Collection Time   02/22/12 11:40 PM      Component Value Range Status Comment   Specimen Description BLOOD RIGHT ARM   Final    Special Requests BOTTLES DRAWN AEROBIC ONLY 10CC   Final    Culture  Setup Time [id number]   Final    Culture     Final    Value:        BLOOD CULTURE RECEIVED NO GROWTH TO DATE CULTURE WILL BE HELD FOR 5 DAYS BEFORE ISSUING A FINAL NEGATIVE REPORT   Report Status PENDING   Incomplete   MRSA PCR SCREENING     Status: Normal   Collection Time   02/23/12  3:13 PM      Component Value Range Status Comment   MRSA by PCR NEGATIVE  NEGATIVE  Final    Studies/Results: Dg Chest 2 View  02/22/2012  *RADIOLOGY REPORT*  Clinical Data: Fever.  Congestion.  HIV.  CHEST - 2 VIEW  Comparison: 02/20/2012  Findings: Shallow inspiration.  Normal heart size and pulmonary vascularity.  No focal airspace consolidation in the lungs.  No blunting of costophrenic angles.  No pneumothorax.  Mediastinal contours appear intact.  The surgical clips in the left axilla.  No significant change since previous study.  IMPRESSION: No evidence of active pulmonary disease.  Original Report Authenticated By: Marlon Pel, M.D.   US Renal  02/23/2012  *RADIOLOGY REPORT*  Clinical Data: Rule out obstruction, renal insufficiency and  RENAL/URINARY TRACT ULTRASOUND  COMPLETE  Comparison: Ultrasound 11/27/2011, CT 11/22/2010 the  Findings:  Right Kidney = 12.7 cm.  7 mm calculus within the upper pole right kidney.  No hydronephrosis.  Left kidney = 10.3 cm.  No hydronephrosis.  Bladder:  Bladder partially distended.  No irregularity identified.  2.3 cm gallstone noted within the gallbladder  IMPRESSION:  1.  Right nephrolithiasis. 2.  No evidence of hydronephrosis of the left or right.  Original Report Authenticated By: Genevive Bi, M.D.   Medications: I have reviewed the patient's current medications. Scheduled Meds:    . amLODipine  10 mg Oral q morning - 10a  . cyanocobalamin  1,000 mcg Intramuscular Q Mon  . dapsone  100 mg Oral q morning - 10a  . efavirenz  600 mg Oral QHS  . emtricitabine  200 mg Oral Q48H  . ferrous sulfate  325 mg Oral TID WC  . fluticasone  2 spray Each Nare Daily  . labetalol  200 mg Oral BID  . loratadine  10 mg Oral Daily  . polyethylene glycol  17 g Oral BID  . polyvinyl alcohol  1 drop Both Eyes BID  . tenofovir  300 mg Oral Q48H  . testosterone  10 g Transdermal q morning - 10a  . DISCONTD: enoxaparin  30 mg Subcutaneous Q24H   Continuous Infusions:    . DISCONTD: sodium chloride 150 mL/hr (02/23/12 1954)   PRN Meds:.acetaminophen, acetaminophen, ondansetron (ZOFRAN) IV, ondansetron  Assessment/Plan: # Fever associated with lymphadenopathy Has had multiple LN biopsies in past, multiple blood cultures, labs, biopsy cultures.  To date nothing has been revealing, and IRIS dx has not been a completely satisfactory explanation given waxing/waning course.  Has had con't fevers in hospital.  During February admission (lots of w/u during that time), question of bone marrow bx was raised.  Given pancytopenia, we think it is appropriate to again raise this issue and likely pursue it. - heme/onc consult for bone marrow bx - f/u blood culture X 2  - No abx at present - Continue to follow fever curve  - consider ID  consult  # Acute on chronic kidney disease Given poor po intake and rapidity of onset, volume depletion is a possibility, but medication side effect (Atripla) is also a consideration.  Renal U/S showed nonobstructive stone only, no hydro.  FeNa>1, but Cr trending down with fluids. Last clinic note from [facility] was unrevealing (sub nephrotic range proteinuria) - con't IVF for 10 more hours today - trend Cr - monitor urine outpt    # Acute Anemia, normocytic Known history of B12 deficiency anemia and receives monthly B12 injections. Last one was 2 weeks ago. His baseline Hb is 10.5 to 12. He is down to 8's.  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  Concerning given worsening anemia with no obvious source. - Fe supplements - trend CBC  # Thrombocytopenia Decrease from previous numbers. no apparent bleeding.  Could be part of pancytopenia picture vs HIT.  Will defer HIT panel for now. - d/c lovenox - Follow trend  #  HIV Compliant with Atripla and Dapsone.  Need to redose atripla drugs given renal failure. - con't renal dosed HAART meds  # Hyponatremia Resolved with IVF.  Will con't to monitor.  - follow up on orthostatics  - check urine electrolytes to further assess  - monitor BMET   # HTN Stable and controlled   - continue Norvasc and labetolol   # DVT Px - scd's   LOS: 2 days   WILDMAN-TOBRINER, BEN 02/24/2012, 11:40 AM

## 2012-02-24 NOTE — Consult Note (Signed)
**Patrick [name] De-Identified via Obfuscation** Patrick Patrick [name]  Reason for Consult: Pancytopenia ID: Dr. Campbell/Dr. Algis Liming Req. MD: Teaching Service  Patrick Patrick [name] is a very pleasant  51 y.o. 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 He was in his usual state of health until 5/25 last week when he developed fevers up to 103 F accompanied by enlargement of his cervical, submandibular, and axillary adenopathy. He presented to the emergency department where he was empirically treated with clindamycin 300 mg qid for possible pharyngitis. strep screen was negative. On 5/27 he presented again to ED due to lack of symptom resolution. He was admitted for further evaluation. CBC on 5/27 showed a WBC of 3.8, ANC 1.7, H/H of 8.7 and 25.9 (in the setting of CRI) respectively and platelet 105 k. His CBC values began to drop after admission, with WBC of 3.4/ANC 1.4, H/H 8.5/25.2 and plt 97. Today his WBC is 3.3, H/H 8.2/25.4 and platelets 94k. Of Patrick [name], in March 2013, his WBC was 5.0 with ANC 2.7, H/H 10.4/33.5 and platelets 214k. In 05/2011 his CBC demonstrated WBC of 5.9, H/H 12.8/40.3 and plts 162.  Retic ct is 0.4.Smear ordered for review.  Hemoccult negative. Fe less than 10. Ferritin 682 in the setting of CRI. Proteinuria noted at 100 on 5/28 (was >300 on 5/24).  BCx to date negative. No macroscopic bleeding issues. Fevers continue, Tmax 102.8 F today (ID to see). Of Patrick [name], patient has known splenomegaly dating back to at least 10/2010.  No transfusion received to date. His last L axillary lymph node biopsy performed on 11/24/2011 showed  no evidence of a malignant lymphoproliferative process (see report below). Flow cytometry showed no  monoclonal B cell population or abnormal T cell phenotype.  However, in view of his recent symptoms, we  were kindly asked to see Patrick Patrick [name] for evaluation of possible hematological disorder.   PMH: Past Medical History  Diagnosis Date  . Hypertension   . CKD (chronic kidney disease) stage 3, GFR 30-59 ml/min     hydrated  Baseline cr 1.6  . HIV (human immunodeficiency virus infection) with last CD4 count was a few months ago and a little over 200 On Atripla, compliant to meds        2010  . Hypogonadism male         Iron deficiency anemia       B12 deficiency receiving monthly B12 injections       H/o MSSA/ MRSA                                                                                                       2010       Gout  2010        Secondary Hyperparathyroidism                                                                                2010              H/o Nephrolithiasis                                                                                                      2010           Obesity                Dyslipidemia                                                                                                               2012        L5-S1 advanced degenerative disc disease                                                                                                                                        Surgeries: Past Surgical History  Procedure Date  . Lithotripsy 2010  . Appendectomy 2001    via midline incision  . Lymph node biopsy 2010, 2011    bilateral... by dr Derrell Lolling  s/p L axillary ln biopsy 11/24/2011  Allergies:  Allergies  Allergen Reactions  . Codeine Itching, Rash and Other (See Comments)    "crazy dreams"  . Sulfonamide Derivatives Rash    Medications:  Prior to Admission:  Prescriptions prior to admission  Medication Sig Dispense Refill  . amLODipine (NORVASC) 10 MG tablet Take 10 mg by mouth every morning.      .  Artificial  Tear Solution (TEARS RENEWED OP) Place 1 drop into both eyes 2 (two) times daily as needed. For dry eyes only when wearing contacts      . cetirizine (ZYRTEC) 10 MG tablet Take 10 mg by mouth daily as needed. For allergy symptoms      . clindamycin (CLEOCIN) 300 MG capsule Take 1 capsule (300 mg total) by mouth 4 (four) times daily.  40 capsule  0  . cyanocobalamin (,VITAMIN B-12,) 1000 MCG/ML injection 1,000 mcg every 7 (seven) days. Takes on Monday      . dapsone 100 MG tablet Take 100 mg by mouth every morning.      Marland Kitchen efavirenz-emtrictabine-tenofovir (ATRIPLA) 600-200-300 MG per tablet Take 1 tablet by mouth at bedtime.      Marland Kitchen labetalol (NORMODYNE) 200 MG tablet Take 200 mg by mouth 2 (two) times daily.      . sodium chloride (OCEAN) 0.65 % nasal spray Place 1 spray into the nose daily as needed. For nasal congestion      . Testosterone 20.25 MG/1.25GM (1.62%) GEL Place 100 mg onto the skin every morning.  1.25 g  3    ZOX:[id number], acetaminophen, ondansetron (ZOFRAN) IV, ondansetron, DISCONTD: polyethylene glycol  ROS: Constitutional: Negative for weight loss.Positive for fever since 5/23, chills and malaise/fatigue for the last 2 months.  Eyes: Negative for blurred vision and double vision.  Mouth: Positive for sore throat as above. Respiratory: Negative for cough, hemoptysis. DOE but not at rest. Cardiovascular: Negative for chest pain. GI: No nausea, vomiting, diarrhea, constipation. No change in bowel caliber. No  Melena or hematochezia. He was due for a colonoscopy in about 1-2 months. GU: No blood in urine. No loss of urinary control. Skin: Negative for itching. No rash. No petechia. No bruising Neurological: Mild headaches. No motor or sensory deficits. Musculoskeletal: Positive for myalgia.No neck pain or stiffness.  Family History:  Family History  Problem Relation Age of Onset  . Adopted: Yes    Social History:  reports that he has never smoked. He has  never used smokeless tobacco. He reports that he drinks about .5 ounces of alcohol per week. He reports that he does not use illicit drugs.Currently not  sexually active, male partners.Contracted HIV through unprotected sex  Lives by himself.Single. No children.  He works 2 part-time jobs as a Engineer, technical sales. Originally from [place]. Moved to [place] 30 years ago.Remains active, likes to bike frequently.    Physical Exam  51 year old  in no acute distress A. and O. X3, looks younger than stated age General well-developed and well-nourished  HEENT: Normocephalic, atraumatic, PERRLA. Oral cavity without thrush or lesions. Neck supple. no thyromegaly, palpable anterior and posterior cervical la.L>R .L supraclavicular la, 2cm mobile. Lungs clear bilaterally . No wheezing, rhonchi or rales. Bilateral axillary l.a, well healed L axillary scar (post biopsy) Breasts: not examined. Cardiac regular rate and rhythm, 2/6 systolic murmur ,no  rubs or gallops Abdomen soft, mildly tender on the RUQ with no apparent hepatomegaly. , bowel sounds x4.  Tip of spleen palpable. There was bilateral shotty inguinal adenopathy about 2-3cm.  GU/rectal: deferred. Extremities no clubbing cyanosis or edema. No bruising or petechial rash. Bilateral inguinal la palpable.      Labs:  CBC   Lab 02/24/12 0530 02/23/12 0417 02/22/12 2330 02/19/12 2255  WBC 3.3* 3.4* 3.8* 5.0  HGB 8.2* 8.5* 8.7* 10.2*  HCT 25.4* 25.2* 25.9* 30.4*  PLT 94* 97* 105* 114*  MCV 89.8 89.0 87.8  89.7  MCH 29.0 30.0 29.5 30.1  MCHC 32.3 33.7 33.6 33.6  RDW 16.4* 16.3* 16.0* 15.7*  LYMPHSABS -- 1.1 1.2 1.4  MONOABS -- 0.8 0.8 0.8  EOSABS -- 0.1 0.1 0.1  BASOSABS -- 0.0 0.0 0.0  BANDABS -- -- -- --     Anemia panel:   Basename 02/23/12 0417  VITAMINB12 391  FOLATE 4.2  FERRITIN 682*  TIBC Not calculated due to Iron <10.  IRON <10*  RETICCTPCT 0.4    No results found for this basename:  TSH,T4TOTAL,FREET3,T3FREE,THYROIDAB in the last 72 hours      Component Value Date/Time   ESRSEDRATE 134* 11/19/2010 1210      CMP    Lab 02/24/12 0530 02/23/12 0417 02/22/12 2330 02/19/12 2255  NA 134* 135 130* 130*  K 4.9 4.3 4.0 4.9  CL 104 103 98 96  CO2 20 22 19 24   GLUCOSE 220* 191* 201* 270*  BUN 27* 30* 28* 23  CREATININE 2.14* 2.54* 2.47* 1.81*  CALCIUM 8.5 8.4 8.5 9.5  MG -- -- -- --  AST -- 21 -- 16  ALT -- 20 -- 11  ALKPHOS -- 58 -- 78  BILITOT -- 0.2* -- 0.5        Component Value Date/Time   BILITOT 0.2* 02/23/2012 0417   BILIDIR <0.1 02/23/2012 0417   IBILI NOT CALCULATED 02/23/2012 0417      Imaging Studies:  Dg Chest 2 View  02/22/2012  *RADIOLOGY REPORT*  Clinical Data: Fever.  Congestion.  HIV.  CHEST - 2 VIEW  Comparison: 02/20/2012  Findings: Shallow inspiration.  Normal heart size and pulmonary vascularity.  No focal airspace consolidation in the lungs.  No blunting of costophrenic angles.  No pneumothorax.  Mediastinal contours appear intact.  The surgical clips in the left axilla.  No significant change since previous study.  IMPRESSION: No evidence of active pulmonary disease.  Original Report Authenticated By: Marlon Pel, M.D.   US Renal  02/23/2012  *RADIOLOGY REPORT*  Clinical Data: Rule out obstruction, renal insufficiency and  RENAL/URINARY TRACT ULTRASOUND COMPLETE  Comparison: Ultrasound 11/27/2011, CT 11/22/2010 the  Findings:  Right Kidney = 12.7 cm.  7 mm calculus within the upper pole right kidney.  No hydronephrosis.  Left kidney = 10.3 cm.  No hydronephrosis.  Bladder:  Bladder partially distended.  No irregularity identified.  2.3 cm gallstone noted within the gallbladder  IMPRESSION:  1.  Right nephrolithiasis. 2.  No evidence of hydronephrosis of the left or right.  Original Report Authenticated By: Genevive Bi, M.D.    CT  Abdomen wo contrast on 11/22/2010  Findings: A persistent right nephrogram and urogram is seen  with an abrupt transition point to nondilated and non opacified proximal right ureter just below the level of the right iliac crest. With contrast in the ureter at this site, a small obstructing calculus cannot be excluded. There is persistent soft tissue stranding throughout the retroperitoneal fat planes bilaterally, and although this has not simply changed, it could represent the etiology of  ureteral obstruction at this site. Bilateral inguinal, iliac, and retroperitoneal lymphadenopathy is stable. Splenomegaly is also unchanged. No evidence of  hemoperitoneum or ascites. Cholelithiasis again noted, without evidence of cholecystitis. The liver, pancreas, adrenal glands, and left kidney are unremarkable in appearance on this noncontrast study.  IMPRESSION:  1. New mild right hydronephrosis with persistent right urogram showing point of obstruction in the proximal right ureter. Etiology of obstruction is unclear, and could be  due to a small calculus which is obscured by residual contrast, or soft tissue stranding seen throughout the retroperitoneal fat although this  appears unchanged compared to recent exam. 2. Stable bilateral inguinal, iliac, and retroperitoneal lymphadenopathy. Stable splenomegaly.  3. Cholelithiasis, without evidence of acute cholecystitis.  FINAL for Patrick Patrick [name], Patrick Patrick [name] ([id number]) REPORT OF SURGICAL PATHOLOGY FINAL DIAGNOSIS Diagnosis Lymph node, biopsy, left axilla - BENIGN LYMPH NODAL TISSUE WITH REGRESSIVELY TRANSFORMED GERMINAL CENTERS, PROMINENT VASCULARITY AND EXTENSIVE PLASMACYTOSIS. - SEE COMMENT. Microscopic Comment Multiple sections show some degenerative changes throughout the lymph nodes. Nonetheless, the architecture is generally preserved with numerous, variably sized but regressively transformed germinal centers characterized by relative hypocellularity and prominent increase in vascularity primarily by high endothelial venules. The increase in vascularity is  also striking in the perifollicular zones throughout the lymph node. The paracortex also displays large clusters and sheets of plasma cells admixed with small lymphocytes to a much lesser extent. Well formed granuloma are not identified. Immunohistochemical stains for CD3, CD20, BCL-2, kappa and lambda were performed. The B-cell compartment is primarily seen within the above mentioned follicles as well as in the immediately adjacent tissue. The latter possibly represents distorted mantle zones because of increased vascularity. Admixed T-cells are seen in the paracortex. BCL-2 stain is negative within the follicular centers. Kappa and lambda stains show polyclonal staining pattern within the abundant plasma cell component present. Additionally, flow cytometric analysis was performed but showed very low cellular viability. Nonetheless, in relatively viable lymphoid cells, there is no evidence of a monoclonal B-cell population or abnormal T-cell phenotype. Given the overall histologic and immunophenotypic features, there is no evidence of a malignant lymphoproliferative process in this material. Given the clinical history of HIV infection, I believe that the overall changes are directly related to HIV. Correlation with culture studies is recommended. (BNS:jy) 21-Dec-2011 Guerry Bruin MD Pathologist, Electronic Signature (Case signed December 21, 2011) Stain(s) used in Diagnosis: The following stain(s) were used in diagnosing the case: CD 3, KAPPA, LAMBDA, CD 20, BCl 2. The control(s) stained appropriately.   FINAL for Patrick Patrick [name], Patrick Patrick [name] [phone]) FLOW CYTOMETRY REPORT INTERPRETATION Interpretation Tissue-Flow Cytometry - NO MONOCLONAL B CELL POPULATION OR ABNORMAL T CELL PHENOTYPE IDENTIFIED (LIMITED CELLULAR VIABILITY) - SEE Patrick [name]. Diagnosis Comment: The material has low cellular viability (30%) and hence the results are suspect and may not be entirely accurate. Nonetheless, viable lymphoid cells do not show any  monoclonal B cell population or abnormal T cell phenotype. Clinical correlation is recommended. (BNS:kh 2011/12/21)  Guerry Bruin MD Pathologist, Electronic Signature (Case signed 12/21/2011)    A/P: 51 y.o. male HIV positive since 2010 in Atripla, admitted with high fevers up to 103, as well as large neck, axillary, supraclavicular and inguinal adenopathy as well as pancytopenia requiring further  Hematological investigation.  Dr.Elmire Amrein   is to see the patient following this consult with recommendations regarding further workup studies to reach etiology/diagnosis.  Thank you for the referral.  [facility] 02/24/2012 10:52 AM  ====================  Attending's attestation.  I personally reviewed extensive medical history including pathology, lab trend, CT scans.  I personally saw patient and come up with the following plan.  I personally reviewed the patient's peripheral blood smear today.  There was aniocytosis.  There was no peripheral blast.  There was no schistocytosis, spherocytosis, target cell, rouleaux formation, tear drop cell.  There was no giant platelets or platelet clumps.  However, the WBC cell line had decreased granulation.    PROBLEM LIST:  1.  HIV/AIDS; started HAART  in 2010.  2.  Vitamin B12 deficiency:  He is on monthly VitB12 injection. 3.  Obesity. 4.  History of nephrolithiasis. 5.  Hydronephrosis. 6.  Chronic renal insufficiency. 7.  Pancytopenia:  On going since 2010; slightly worsened lately. 8.  Diffuse intermittent adenopathy with negative biopsy of left axilary node x 2. 9.  Recurrent fever coinciding with adenopathy.   IMPRESSION:   1.  Pancytopenia:   -Differential diagnosis:  Most likely HIV related.  I have low clinical suspicion for myelodysplastic syndrome given his age.  However, I cannot rule this out given that his pancytopenia has slightly worsened and there was hypogranulated WBC.  Aplastic anemia is a possibility; however, this is less likely  since pancytopenia tends to be rapid within one year; and he has had pancytopenia for 2 years now.   I have low clinical suspicion for lymphoproliferative disease process given 2 negative node biopsies.  Vit B12 deficiency can at time result in pancytopenia; however, it corrects quite rapidly once VitB12 if repleted.  In the past, he was ruled out for myeloma with negative SPEP.   - I recommend diagnostic bone marrow biopsy to rule out primary bone marrow failure process.  2.  Fever of unknown origin: - Differential diagnosis:  Most likely benign as extensive micro work up has been negative.   I cannot rule out autoimmune disease but ANA was negative in 10/2010.  Cannot rule out Still's disease (fever, adenopathy, elevated ferritin; however, he lacks the common skin rash associated with Still).  - I recommend bone marrow biopsy and send aspirate for cytogenetics, in addition to AFB, fungal, viral cultures.   - If bone marrow biopsy work up is negative, consider outpatient Rheum evaluation to see if this can be Still's disease.   3. Lymphadenopathy:   - Most likely reactive.  Left axillary nodes biopsy x2 were negative.  I have low clinical suspicion for a primary lymphoproliferative process since his adenopathy is intermittent and not progressive.  He was ruled out in the past for granulomatous disease and his ACE level was normal.   - No repeat node excision is indicated unless he develops persistent, (instead of intermittent) and progressive adenopathy.   4.  Splenomegaly:  Like #3.  There was no sign of splenic abscess on previous CT's scan and pt appeared too healthy for an abscess.   I do not advocate splenic biopsy due to high risk of bleeding.   5.  Follow up:  Patient does not need to in patient for result of bone marrow biopsy (it takes 3-5 days).  Please have him call [phone] upon d/c for [facility] to follow up with bone marrow biopsy result.

## 2012-02-24 NOTE — Progress Notes (Signed)
Aware of request for CT guided bone marrow bx. Can plan to do procedure tomorrow. Will make NPO after MN Will see pt to discuss procedure.  Barbee Shropshire [facility] 02/24/2012 4:45 PM

## 2012-02-25 ENCOUNTER — Telehealth: Payer: Self-pay | Admitting: *Deleted

## 2012-02-25 ENCOUNTER — Encounter (HOSPITAL_COMMUNITY): Payer: Self-pay | Admitting: Radiology

## 2012-02-25 ENCOUNTER — Inpatient Hospital Stay (HOSPITAL_COMMUNITY): Payer: Medicaid Other

## 2012-02-25 LAB — CBC
MCH: 30 pg (ref 26.0–34.0)
MCHC: 33.2 g/dL (ref 30.0–36.0)
MCV: 90.3 fL (ref 78.0–100.0)
Platelets: 104 10*3/uL — ABNORMAL LOW (ref 150–400)
RDW: 16.7 % — ABNORMAL HIGH (ref 11.5–15.5)

## 2012-02-25 LAB — BASIC METABOLIC PANEL
CO2: 20 mEq/L (ref 19–32)
Calcium: 8.4 mg/dL (ref 8.4–10.5)
Creatinine, Ser: 2.04 mg/dL — ABNORMAL HIGH (ref 0.50–1.35)
GFR calc non Af Amer: 36 mL/min — ABNORMAL LOW (ref >90)
Glucose, Bld: 178 mg/dL — ABNORMAL HIGH (ref 70–99)

## 2012-02-25 LAB — HEMOGLOBIN A1C: Hgb A1c MFr Bld: 6.6 % — ABNORMAL HIGH (ref <5.7)

## 2012-02-25 LAB — PROTIME-INR: Prothrombin Time: 16.9 seconds — ABNORMAL HIGH (ref 11.6–15.2)

## 2012-02-25 MED ORDER — MIDAZOLAM HCL 2 MG/2ML IJ SOLN
INTRAMUSCULAR | Status: AC
  Filled 2012-02-25: qty 6

## 2012-02-25 MED ORDER — SODIUM CHLORIDE 0.9 % IV SOLN
INTRAVENOUS | Status: AC
  Administered 2012-02-25: 150 mL/h via INTRAVENOUS

## 2012-02-25 MED ORDER — FENTANYL CITRATE 0.05 MG/ML IJ SOLN
INTRAMUSCULAR | Status: AC | PRN
  Administered 2012-02-25 (×3): 50 ug via INTRAVENOUS

## 2012-02-25 MED ORDER — SODIUM CHLORIDE 0.9 % IV BOLUS (SEPSIS)
500.0000 mL | Freq: Once | INTRAVENOUS | Status: AC
  Administered 2012-02-25: 500 mL via INTRAVENOUS

## 2012-02-25 MED ORDER — MIDAZOLAM HCL 5 MG/5ML IJ SOLN
INTRAMUSCULAR | Status: AC | PRN
  Administered 2012-02-25: 2 mg via INTRAVENOUS
  Administered 2012-02-25: 1 mg via INTRAVENOUS

## 2012-02-25 MED ORDER — FENTANYL CITRATE 0.05 MG/ML IJ SOLN
INTRAMUSCULAR | Status: AC
  Filled 2012-02-25: qty 4

## 2012-02-25 NOTE — Progress Notes (Signed)
Addended by: Neomia Dear on: 02/25/2012 08:06 PM   Modules accepted: Orders

## 2012-02-25 NOTE — Procedures (Signed)
CT bone marrow bx L iliac No complication No blood loss. See complete dictation in [facility] PACS.  

## 2012-02-25 NOTE — Progress Notes (Signed)
Subjective: Pt febrile to 100 yesterday evening.  Otherwise feeling okay.  Objective: Vital signs in last 24 hours: Filed Vitals:   02/25/12 1030 02/25/12 1035 02/25/12 1039 02/25/12 1043  BP: 115/57 120/59 124/55 122/57  Pulse: 109 109  106  Temp:      TempSrc:      Resp: 24 19 20 20   Height:      Weight:      SpO2: 96% 98% 98% 99%   Weight change: 2 lb 1.6 oz (0.953 kg)  Intake/Output Summary (Last 24 hours) at 02/25/12 1045 Last data filed at 02/25/12 0900  Gross per 24 hour  Intake    360 ml  Output    300 ml  Net     60 ml   Physical Exam GEN: NAD.  Alert and oriented x 3.  Pleasant, conversant, and cooperative to exam. RESP:  CTAB, no w/r/r CARDIOVASCULAR: RRR, S1, S2, 2/6 HSM across precordium, loudest at LUSB ABDOMEN: soft, NT/ND, NABS EXT: warm and dry, diffuse adenopathy as previously documented. No edema in b/l LE  Lab Results: Basic Metabolic Panel:  Lab 02/25/12 1610 02/24/12 0530  NA 133* 134*  K 4.6 4.9  CL 101 104  CO2 20 20  GLUCOSE 178* 220*  BUN 27* 27*  CREATININE 2.04* 2.14*  CALCIUM 8.4 8.5  MG -- --  PHOS -- --   Liver Function Tests:  Lab 02/23/12 0417 02/19/12 2255  AST 21 16  ALT 20 11  ALKPHOS 58 78  BILITOT 0.2* 0.5  PROT 7.1 8.0  ALBUMIN 2.5* 3.2*   CBC:  Lab 02/25/12 0620 02/24/12 0530 02/23/12 0417 02/22/12 2330  WBC 3.0* 3.3* -- --  NEUTROABS -- -- 1.4* 1.7  HGB 8.0* 8.2* -- --  HCT 24.1* 25.4* -- --  MCV 90.3 89.8 -- --  PLT 104* 94* -- --   Anemia Panel:  Lab 02/23/12 0417  VITAMINB12 391  FOLATE 4.2  FERRITIN 682*  TIBC Not calculated due to Iron <10.  IRON <10*  RETICCTPCT 0.4   Urine Drug Screen: Drugs of Abuse     Component Value Date/Time   LABOPIA NONE DETECTED 11/19/2010 0348   COCAINSCRNUR NONE DETECTED 11/19/2010 0348   LABBENZ NONE DETECTED 11/19/2010 0348   AMPHETMU NONE DETECTED 11/19/2010 0348   THCU NONE DETECTED 11/19/2010 0348   LABBARB  Value: NONE DETECTED        DRUG SCREEN FOR  MEDICAL PURPOSES ONLY.  IF CONFIRMATION IS NEEDED FOR ANY PURPOSE, NOTIFY LAB WITHIN 5 DAYS.        LOWEST DETECTABLE LIMITS FOR URINE DRUG SCREEN Drug Class       Cutoff (ng/mL) Amphetamine      1000 Barbiturate      200 Benzodiazepine   200 Tricyclics       300 Opiates          300 Cocaine          300 THC              50 11/19/2010 0348    Urinalysis:  Lab 02/23/12 0300 02/19/12 2246  COLORURINE YELLOW AMBER*  LABSPEC 1.018 1.025  PHURINE 5.5 5.5  GLUCOSEU NEGATIVE NEGATIVE  HGBUR SMALL* MODERATE*  BILIRUBINUR SMALL* SMALL*  KETONESUR 15* 15*  PROTEINUR 100* >300*  UROBILINOGEN 0.2 1.0  NITRITE NEGATIVE NEGATIVE  LEUKOCYTESUR NEGATIVE NEGATIVE    Micro Results: Recent Results (from the past 240 hour(s))  CULTURE, BLOOD (ROUTINE X 2)  Status: Normal (Preliminary result)   Collection Time   02/20/12  1:00 AM      Component Value Range Status Comment   Specimen Description BLOOD LEFT ARM   Final    Special Requests BOTTLES DRAWN AEROBIC AND ANAEROBIC 10CC EACH   Final    Culture  Setup Time [id number]   Final    Culture     Final    Value:        BLOOD CULTURE RECEIVED NO GROWTH TO DATE CULTURE WILL BE HELD FOR 5 DAYS BEFORE ISSUING A FINAL NEGATIVE REPORT   Report Status PENDING   Incomplete   CULTURE, BLOOD (ROUTINE X 2)     Status: Normal (Preliminary result)   Collection Time   02/20/12  1:15 AM      Component Value Range Status Comment   Specimen Description BLOOD RIGHT HAND   Final    Special Requests BOTTLES DRAWN AEROBIC AND ANAEROBIC 10CC EACH   Final    Culture  Setup Time [id number]   Final    Culture     Final    Value:        BLOOD CULTURE RECEIVED NO GROWTH TO DATE CULTURE WILL BE HELD FOR 5 DAYS BEFORE ISSUING A FINAL NEGATIVE REPORT   Report Status PENDING   Incomplete   RAPID STREP SCREEN     Status: Normal   Collection Time   02/20/12  1:30 AM      Component Value Range Status Comment   Streptococcus, Group A Screen (Direct) NEGATIVE  NEGATIVE  Final     CULTURE, BLOOD (ROUTINE X 2)     Status: Normal (Preliminary result)   Collection Time   02/22/12 11:30 PM      Component Value Range Status Comment   Specimen Description BLOOD LEFT ARM   Final    Special Requests BOTTLES DRAWN AEROBIC AND ANAEROBIC 10CC EA   Final    Culture  Setup Time [id number]   Final    Culture     Final    Value:        BLOOD CULTURE RECEIVED NO GROWTH TO DATE CULTURE WILL BE HELD FOR 5 DAYS BEFORE ISSUING A FINAL NEGATIVE REPORT   Report Status PENDING   Incomplete   CULTURE, BLOOD (ROUTINE X 2)     Status: Normal (Preliminary result)   Collection Time   02/22/12 11:40 PM      Component Value Range Status Comment   Specimen Description BLOOD RIGHT ARM   Final    Special Requests BOTTLES DRAWN AEROBIC ONLY 10CC   Final    Culture  Setup Time [id number]   Final    Culture     Final    Value:        BLOOD CULTURE RECEIVED NO GROWTH TO DATE CULTURE WILL BE HELD FOR 5 DAYS BEFORE ISSUING A FINAL NEGATIVE REPORT   Report Status PENDING   Incomplete   MRSA PCR SCREENING     Status: Normal   Collection Time   02/23/12  3:13 PM      Component Value Range Status Comment   MRSA by PCR NEGATIVE  NEGATIVE  Final    Studies/Results: US Renal  02/23/2012  *RADIOLOGY REPORT*  Clinical Data: Rule out obstruction, renal insufficiency and  RENAL/URINARY TRACT ULTRASOUND COMPLETE  Comparison: Ultrasound 11/27/2011, CT 11/22/2010 the  Findings:  Right Kidney = 12.7 cm.  7 mm calculus within the upper pole right kidney.  No hydronephrosis.  Left kidney = 10.3 cm.  No hydronephrosis.  Bladder:  Bladder partially distended.  No irregularity identified.  2.3 cm gallstone noted within the gallbladder  IMPRESSION:  1.  Right nephrolithiasis. 2.  No evidence of hydronephrosis of the left or right.  Original Report Authenticated By: Genevive Bi, M.D.   Medications: I have reviewed the patient's current medications. Scheduled Meds:    . amLODipine  10 mg Oral q morning - 10a  .  cyanocobalamin  1,000 mcg Intramuscular Q Mon  . dapsone  100 mg Oral q morning - 10a  . efavirenz  600 mg Oral QHS  . emtricitabine  200 mg Oral Q48H  . fentaNYL      . ferrous sulfate  325 mg Oral TID WC  . fluticasone  2 spray Each Nare Daily  . labetalol  200 mg Oral BID  . loratadine  10 mg Oral Daily  . midazolam      . polyethylene glycol  17 g Oral BID  . polyvinyl alcohol  1 drop Both Eyes BID  . sodium chloride  500 mL Intravenous Once  . tenofovir  300 mg Oral Q48H  . testosterone  10 g Transdermal q morning - 10a  . zolpidem  10 mg Oral Once   Continuous Infusions:    . sodium chloride 150 mL/hr at 02/24/12 1609  . sodium chloride 150 mL/hr (02/25/12 0902)   PRN Meds:.acetaminophen, acetaminophen, fentaNYL, midazolam, ondansetron (ZOFRAN) IV, ondansetron  Assessment/Plan: # Fever associated with lymphadenopathy Got excellent heme/onc consult yesterday regarding utility of bone marrow bx for fevers, LAD, and pancytopenia.  Ddx includes rarer pathology given extensive w/u thus far. Please see their note for full details. Per recommendations, will pursue bx this a.m.  D/w primary ID doctor (Dr. Orvan Falconer), who felt that marrow should be sent for cytology, fungal cx, and afb but that viral cultures would have minimal use. - appreciate heme/onc rec's - bone marrow bx today-->send for cytology, AFB, fungal, no viral cultures - will f/u in heme onc office for results, likely next week - f/u blood culture X 2--> NGTD - No abx at present - Continue to follow fever curve   # Acute on chronic kidney disease Cr con't to trend down sightly, but still above baseline.  Given poor po intake and rapidity of onset, volume depletion is a possibility, but medication side effect (Atripla) is also a consideration.  Renal U/S showed nonobstructive stone only, no hydro.  FeNa>1. Last clinic note from [facility] was unrevealing (sub nephrotic range proteinuria) - con't IVF today - trend  Cr - will have outpt f/u next week in IM clinic  # Acute Anemia, normocytic Known history of B12 deficiency anemia and receives monthly B12 injections. Last one was 2 weeks ago. His baseline Hb is 10.5 to 12. He is down to 8's.  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  Concerning given worsening anemia with no obvious source. - Fe supplements - trend CBC - f/u bone marrow aspirate  # Thrombocytopenia Stable but decrease from previous numbers. no apparent bleeding.  Could be part of pancytopenia picture vs HIT.  Will defer HIT panel for now. - d/c lovenox - Follow trend  # HIV Compliant with Atripla and Dapsone.  Need to redose atripla drugs given renal failure. - con't renal dosed HAART meds  # Hyponatremia Resolved with IVF.  Will con't to monitor.  - follow up on orthostatics  - check urine  electrolytes to further assess  - monitor BMET   # HTN Stable and controlled   - continue Norvasc and labetolol   # DVT Px - scd's   LOS: 3 days   Patrick [name], Patrick [name] 02/25/2012, 10:45 AM

## 2012-02-25 NOTE — Telephone Encounter (Signed)
Rec'd referral for this patient from Dr Kathreen Cosier, IM Resident. Patient seen by Dr Gaylyn Rong and needs hospital f/up next week. Confirmation by Dr Gaylyn Rong for appt next week. Spoke with patient still [facility] Room [phone], expected discharge today. Best contact number, [phone], morning appts if possible. Orders to scheduling.

## 2012-02-25 NOTE — H&P (Signed)
Patrick [name] is an 51 y.o. male.   Chief Complaint: lethargy; fever; pancytopenia Scheduled for bone marrow biopsy in IR today HPI: CKD; HTN; HIV  Past Medical History  Diagnosis Date  . Hypertension   . CKD (chronic kidney disease) stage 3, GFR 30-59 ml/min     hydrated cr 1.5 gfr 48  . HIV (human immunodeficiency virus infection)   . Hypogonadism male     Past Surgical History  Procedure Date  . Lithotripsy 2010  . Appendectomy 2001    via midline incision  . Lymph node biopsy 2010, 2011    bilateral... by dr Derrell Lolling    Family History  Problem Relation Age of Onset  . Adopted: Yes   Social History:  reports that he has never smoked. He has never used smokeless tobacco. He reports that he drinks about .5 ounces of alcohol per week. He reports that he does not use illicit drugs.  Allergies:  Allergies  Allergen Reactions  . Codeine Itching, Rash and Other (See Comments)    "crazy dreams"  . Sulfonamide Derivatives Rash    Medications Prior to Admission  Medication Sig Dispense Refill  . amLODipine (NORVASC) 10 MG tablet Take 10 mg by mouth every morning.      . Artificial Tear Solution (TEARS RENEWED OP) Place 1 drop into both eyes 2 (two) times daily as needed. For dry eyes only when wearing contacts      . cetirizine (ZYRTEC) 10 MG tablet Take 10 mg by mouth daily as needed. For allergy symptoms      . clindamycin (CLEOCIN) 300 MG capsule Take 1 capsule (300 mg total) by mouth 4 (four) times daily.  40 capsule  0  . cyanocobalamin (,VITAMIN B-12,) 1000 MCG/ML injection 1,000 mcg every 7 (seven) days. Takes on Monday      . dapsone 100 MG tablet Take 100 mg by mouth every morning.      Marland Kitchen efavirenz-emtrictabine-tenofovir (ATRIPLA) 600-200-300 MG per tablet Take 1 tablet by mouth at bedtime.      Marland Kitchen labetalol (NORMODYNE) 200 MG tablet Take 200 mg by mouth 2 (two) times daily.      . sodium chloride (OCEAN) 0.65 % nasal spray Place 1 spray into the nose daily as needed.  For nasal congestion      . Testosterone 20.25 MG/1.25GM (1.62%) GEL Place 100 mg onto the skin every morning.  1.25 g  3    Results for orders placed during the hospital encounter of 02/22/12 (from the past 48 hour(s))  MRSA PCR SCREENING     Status: Normal   Collection Time   02/23/12  3:13 PM      Component Value Range Comment   MRSA by PCR NEGATIVE  NEGATIVE    BASIC METABOLIC PANEL     Status: Abnormal   Collection Time   02/24/12  5:30 AM      Component Value Range Comment   Sodium 134 (*) 135 - 145 (mEq/L)    Potassium 4.9  3.5 - 5.1 (mEq/L)    Chloride 104  96 - 112 (mEq/L)    CO2 20  19 - 32 (mEq/L)    Glucose, Bld 220 (*) 70 - 99 (mg/dL)    BUN 27 (*) 6 - 23 (mg/dL)    Creatinine, Ser 9.60 (*) 0.50 - 1.35 (mg/dL)    Calcium 8.5  8.4 - 10.5 (mg/dL)    GFR calc non Af Amer 34 (*) >90 (mL/min)    GFR  calc Af Amer 40 (*) >90 (mL/min)   CBC     Status: Abnormal   Collection Time   02/24/12  5:30 AM      Component Value Range Comment   WBC 3.3 (*) 4.0 - 10.5 (K/uL)    RBC 2.83 (*) 4.22 - 5.81 (MIL/uL)    Hemoglobin 8.2 (*) 13.0 - 17.0 (g/dL)    HCT 16.1 (*) 09.6 - 52.0 (%)    MCV 89.8  78.0 - 100.0 (fL)    MCH 29.0  26.0 - 34.0 (pg)    MCHC 32.3  30.0 - 36.0 (g/dL)    RDW 04.5 (*) 40.9 - 15.5 (%)    Platelets 94 (*) 150 - 400 (K/uL) CONSISTENT WITH PREVIOUS RESULT  PATHOLOGIST SMEAR REVIEW     Status: Normal   Collection Time   02/24/12  5:30 AM      Component Value Range Comment   Tech Review Mild absolute neutropenia     OCCULT BLOOD X 1 CARD TO LAB, STOOL     Status: Normal   Collection Time   02/24/12 11:01 AM      Component Value Range Comment   Fecal Occult Bld NEGATIVE     BASIC METABOLIC PANEL     Status: Abnormal   Collection Time   02/25/12  6:20 AM      Component Value Range Comment   Sodium 133 (*) 135 - 145 (mEq/L)    Potassium 4.6  3.5 - 5.1 (mEq/L)    Chloride 101  96 - 112 (mEq/L)    CO2 20  19 - 32 (mEq/L)    Glucose, Bld 178 (*) 70 - 99 (mg/dL)      BUN 27 (*) 6 - 23 (mg/dL)    Creatinine, Ser 8.11 (*) 0.50 - 1.35 (mg/dL)    Calcium 8.4  8.4 - 10.5 (mg/dL)    GFR calc non Af Amer 36 (*) >90 (mL/min)    GFR calc Af Amer 42 (*) >90 (mL/min)   CBC     Status: Abnormal   Collection Time   02/25/12  6:20 AM      Component Value Range Comment   WBC 3.0 (*) 4.0 - 10.5 (K/uL)    RBC 2.67 (*) 4.22 - 5.81 (MIL/uL)    Hemoglobin 8.0 (*) 13.0 - 17.0 (g/dL)    HCT 91.4 (*) 78.2 - 52.0 (%)    MCV 90.3  78.0 - 100.0 (fL)    MCH 30.0  26.0 - 34.0 (pg)    MCHC 33.2  30.0 - 36.0 (g/dL)    RDW 95.6 (*) 21.3 - 15.5 (%)    Platelets 104 (*) 150 - 400 (K/uL) CONSISTENT WITH PREVIOUS RESULT  APTT     Status: Abnormal   Collection Time   02/25/12  6:20 AM      Component Value Range Comment   aPTT 45 (*) 24 - 37 (seconds)   PROTIME-INR     Status: Abnormal   Collection Time   02/25/12  6:20 AM      Component Value Range Comment   Prothrombin Time 16.9 (*) 11.6 - 15.2 (seconds)    INR 1.35  0.00 - 1.49     US Renal  02/23/2012  *RADIOLOGY REPORT*  Clinical Data: Rule out obstruction, renal insufficiency and  RENAL/URINARY TRACT ULTRASOUND COMPLETE  Comparison: Ultrasound 11/27/2011, CT 11/22/2010 the  Findings:  Right Kidney = 12.7 cm.  7 mm calculus within the upper pole right kidney.  No  hydronephrosis.  Left kidney = 10.3 cm.  No hydronephrosis.  Bladder:  Bladder partially distended.  No irregularity identified.  2.3 cm gallstone noted within the gallbladder  IMPRESSION:  1.  Right nephrolithiasis. 2.  No evidence of hydronephrosis of the left or right.  Original Report Authenticated By: Genevive Bi, M.D.    Review of Systems  Constitutional: Positive for fever.  Cardiovascular: Negative for chest pain.  Gastrointestinal: Positive for nausea. Negative for vomiting and abdominal pain.  Musculoskeletal: Positive for myalgias.  Neurological: Positive for headaches. Negative for dizziness.    Blood pressure 124/65, pulse 97, temperature 98.6  F (37 C), temperature source Oral, resp. rate 17, height 6' (1.829 m), weight 242 lb 9.6 oz (110.043 kg), SpO2 100.00%. Physical Exam  Constitutional: He is oriented to person, place, and time. He appears well-developed and well-nourished.  Cardiovascular: Normal rate, regular rhythm and normal heart sounds.   No murmur heard. Respiratory: Effort normal and breath sounds normal. He has no wheezes.  GI: Soft. Bowel sounds are normal. There is no tenderness.  Musculoskeletal: Normal range of motion.  Neurological: He is alert and oriented to person, place, and time. Coordination normal.  Skin: Skin is warm and dry.  Psychiatric: He has a normal mood and affect. His behavior is normal. Judgment and thought content normal.     Assessment/Plan Pancytopenia; fever; HIV Scheduled for BM bx now Pt aware of procedure benefits and risks and agreeable to proceed Consent signed and in chart   Dalonte Hardage A 02/25/2012, 9:24 AM

## 2012-02-25 NOTE — Progress Notes (Signed)
Internal Medicine Attending  Date: 02/25/2012  Patient name: Patrick [name] Medical record number: [id number] Date of birth: 1960/12/18 Age: 51 y.o. Gender: male  I saw and evaluated the patient. I reviewed the resident's note by Dr. Abner Greenspan and I agree with the resident's findings and plans as documented in his progress note.  Patrick [name] was seen on rounds this morning. He continues to have low-grade fevers and adenopathy. He was without any new complaints. I appreciate the Hematology consultation and teaching. I also appreciate the performance of a bone marrow biopsy to rule out several processes that remain in the differential. Patrick [name] will be discharged home tomorrow with followup in the [facility] and the [facility] while we await the results of the bone marrow biopsy.

## 2012-02-26 ENCOUNTER — Telehealth: Payer: Self-pay | Admitting: Oncology

## 2012-02-26 DIAGNOSIS — D61818 Other pancytopenia: Secondary | ICD-10-CM

## 2012-02-26 DIAGNOSIS — N179 Acute kidney failure, unspecified: Secondary | ICD-10-CM | POA: Diagnosis present

## 2012-02-26 DIAGNOSIS — B20 Human immunodeficiency virus [HIV] disease: Secondary | ICD-10-CM

## 2012-02-26 DIAGNOSIS — E139 Other specified diabetes mellitus without complications: Secondary | ICD-10-CM

## 2012-02-26 DIAGNOSIS — R509 Fever, unspecified: Principal | ICD-10-CM

## 2012-02-26 DIAGNOSIS — N189 Chronic kidney disease, unspecified: Secondary | ICD-10-CM | POA: Diagnosis present

## 2012-02-26 DIAGNOSIS — E871 Hypo-osmolality and hyponatremia: Secondary | ICD-10-CM | POA: Diagnosis present

## 2012-02-26 HISTORY — DX: Other pancytopenia: D61.818

## 2012-02-26 LAB — BASIC METABOLIC PANEL
BUN: 27 mg/dL — ABNORMAL HIGH (ref 6–23)
CO2: 20 mEq/L (ref 19–32)
Calcium: 8.3 mg/dL — ABNORMAL LOW (ref 8.4–10.5)
Creatinine, Ser: 1.91 mg/dL — ABNORMAL HIGH (ref 0.50–1.35)
GFR calc non Af Amer: 39 mL/min — ABNORMAL LOW (ref >90)
Glucose, Bld: 208 mg/dL — ABNORMAL HIGH (ref 70–99)

## 2012-02-26 LAB — CBC
MCH: 29.4 pg (ref 26.0–34.0)
MCHC: 32.5 g/dL (ref 30.0–36.0)
MCV: 90.6 fL (ref 78.0–100.0)
Platelets: 117 10*3/uL — ABNORMAL LOW (ref 150–400)
RDW: 16.9 % — ABNORMAL HIGH (ref 11.5–15.5)

## 2012-02-26 LAB — CULTURE, BLOOD (ROUTINE X 2)
Culture  Setup Time: 201305251117
Culture: NO GROWTH

## 2012-02-26 MED ORDER — PSEUDOEPHEDRINE HCL 30 MG PO TABS
30.0000 mg | ORAL_TABLET | Freq: Four times a day (QID) | ORAL | Status: DC
  Administered 2012-02-26 (×2): 30 mg via ORAL
  Filled 2012-02-26 (×4): qty 1

## 2012-02-26 MED ORDER — FERROUS SULFATE 325 (65 FE) MG PO TABS: 325.0000 mg | ORAL_TABLET | Freq: Three times a day (TID) | ORAL | Status: DC

## 2012-02-26 MED ORDER — DIPHENHYDRAMINE HCL 25 MG PO TABS
25.0000 mg | ORAL_TABLET | Freq: Four times a day (QID) | ORAL | Status: DC
  Administered 2012-02-26 (×2): 25 mg via ORAL
  Filled 2012-02-26 (×4): qty 1

## 2012-02-26 MED ORDER — EMTRICITABINE 200 MG PO CAPS: 200.0000 mg | ORAL_CAPSULE | ORAL | Status: DC

## 2012-02-26 MED ORDER — CYANOCOBALAMIN 1000 MCG/ML IJ SOLN
1000.0000 ug | Freq: Once | INTRAMUSCULAR | Status: AC
  Administered 2012-02-26: 1000 ug via INTRAMUSCULAR
  Filled 2012-02-26 (×2): qty 1

## 2012-02-26 MED ORDER — EFAVIRENZ 600 MG PO TABS: 600.0000 mg | ORAL_TABLET | Freq: Every day | ORAL | Status: DC

## 2012-02-26 MED ORDER — TENOFOVIR DISOPROXIL FUMARATE 300 MG PO TABS: 300.0000 mg | ORAL_TABLET | ORAL | Status: DC

## 2012-02-26 NOTE — Progress Notes (Signed)
Internal Medicine Attending  Date: 02/26/2012  Patient name: Patrick [name] Medical record number: [id number] Date of birth: 03-10-1961 Age: 51 y.o. Gender: male  I saw and evaluated the patient. I reviewed the resident's note by Dr. Abner Greenspan and I agree with the resident's findings and plans as documented in his progress note.  Mr. Hufnagle was seen on rounds this AM.  He was without any new complaints.  He tolerated the bone marrow biopsy well and is ready to go home.  I agree with the housestaff's plan to discharge Mr. Tengan today with follow-up in the [facility] and [facility] to discuss the results of the pending labs and follow-up on the renal function to see if the combination atripla could be restarted.

## 2012-02-26 NOTE — Discharge Summary (Signed)
Internal Medicine Teaching [facility] Discharge Note  Name: Patrick [name] MRN: [id number] DOB: 1961-02-08 51 y.o.  Date of Admission: 02/22/2012  9:12 PM Date of Discharge: 02/26/2012 Attending Physician: Rocco Serene, MD  Discharge Diagnosis: Principal Problem:  *Pancytopenia with fever Active Problems:  HIV DISEASE  HYPERTENSION  HYPERGLYCEMIA  LYMPHADENOPATHY, DIFFUSE  Acute on chronic renal failure  Hyponatremia   Discharge Medications: Medication List  As of 02/26/2012 12:44 PM   STOP taking these medications         clindamycin 300 MG capsule      efavirenz-emtrictabine-tenofovir 600-200-300 MG per tablet         TAKE these medications         amLODipine 10 MG tablet   Commonly known as: NORVASC   Take 10 mg by mouth every morning.      cetirizine 10 MG tablet   Commonly known as: ZYRTEC   Take 10 mg by mouth daily as needed. For allergy symptoms      cyanocobalamin 1000 MCG/ML injection   Commonly known as: (VITAMIN B-12)   1,000 mcg every 7 (seven) days. Takes on Monday      dapsone 100 MG tablet   Take 100 mg by mouth every morning.      efavirenz 600 MG tablet   Commonly known as: SUSTIVA   Take 1 tablet (600 mg total) by mouth at bedtime.      emtricitabine 200 MG capsule   Commonly known as: EMTRIVA   Take 1 capsule (200 mg total) by mouth every other day.      ferrous sulfate 325 (65 FE) MG tablet   Take 1 tablet (325 mg total) by mouth 3 (three) times daily with meals.      labetalol 200 MG tablet   Commonly known as: NORMODYNE   Take 200 mg by mouth 2 (two) times daily.      sodium chloride 0.65 % nasal spray   Commonly known as: OCEAN   Place 1 spray into the nose daily as needed. For nasal congestion      TEARS RENEWED OP   Place 1 drop into both eyes 2 (two) times daily as needed. For dry eyes only when wearing contacts      tenofovir 300 MG tablet   Commonly known as: VIREAD   Take 1 tablet (300 mg total) by mouth  every other day.      Testosterone 20.25 MG/1.25GM (1.62%) Gel   Place 100 mg onto the skin every morning.            Disposition and follow-up:   [name] was discharged from [facility] in Stable condition.  At the hospital follow up visit please address  Hematology: 1. please review bone marrow biopsy results and refer to rheumatology as you see fit.  PMD: 1. Please check a CBC given recent worsening of anemia 2. Please check a BMP given recent worsening of renal failure, consider appt with nephrology (already established pt) if Cr is still low. Need to adjust HIV meds depending on renal function. Needs diabetes added to clinic problem lists  ID: 1. Please follow up on any other tests you see fit in light of bone marrow results. Fungal and AFB bone marrow cultures will likely still be in process. His HIV medicines were changed to be renally dosed given his renal injury. We will recheck his BMP at his followup appointment and adjust medicines as necessary  Follow-up Appointments: Follow-up Information    Follow up with [facility], [place]. (They will call you with appointment, but call them if you don't hear from them)    Contact information:   [address] [place] [address] [phone]       Follow up with Kathreen Cosier, MD on 03/04/2012. (9:15am)    Contact information:   [address] [place] [address] [phone]       Follow up with Cliffton Asters, MD on 03/10/2012. (2:00pm)    Contact information:   [address] [place] [address] [phone]         Discharge Orders    Future Appointments: Provider: Department: Dept Phone: Center:   03/03/2012 8:00 AM [facility] [phone] None   03/03/2012 8:30 AM Exie Parody, MD [facility] [phone] None   03/04/2012 9:15 AM Daryel Gerald, MD [facility] [phone] [facility]    03/10/2012 2:00 PM Cliffton Asters, MD [facility] [phone] [facility]   03/16/2012 3:45 PM Linward Headland, MD [facility] [phone] [facility]   04/05/2012 9:30 AM [facility] [phone] [facility]   04/19/2012 9:15 AM Cliffton Asters, MD [facility] [phone] [facility]     Future Orders Please Complete By Expires   Discharge instructions      Comments:   1. Please follow up with your providers as scheduled 2. Iron can be constipating, so use stool softeners 3. See you in clinic!      Consultations: Heme/Onc Treatment Team:  Exie Parody, MD  Procedures Performed:  Dg Chest 2 View  02/22/2012  *RADIOLOGY REPORT*  Clinical Data: Fever.  Congestion.  HIV.  CHEST - 2 VIEW  Comparison: 02/20/2012  Findings: Shallow inspiration.  Normal heart size and pulmonary vascularity.  No focal airspace consolidation in the lungs.  No blunting of costophrenic angles.  No pneumothorax.  Mediastinal contours appear intact.  The surgical clips in the left axilla.  No significant change since previous study.  IMPRESSION: No evidence of active pulmonary disease.  Original Report Authenticated By: Marlon Pel, M.D.   Dg Chest 2 View  02/20/2012  *RADIOLOGY REPORT*  Clinical Data: Fever, nausea and vomiting, sore throat.  CHEST - 2 VIEW  Comparison: 11/21/2011  Findings: Shallow inspiration.  Heart size and pulmonary vascularity are normal.  No focal airspace consolidation in the lungs.  No blunting of costophrenic angles.  No pneumothorax. Surgical clips in the left axilla.  Stable appearance since previous study.  IMPRESSION: Shallow inspiration.  No evidence of active pulmonary disease.  Original Report Authenticated By: Marlon Pel, M.D.   US Renal  02/23/2012  *RADIOLOGY REPORT*  Clinical Data: Rule out obstruction, renal insufficiency and  RENAL/URINARY TRACT ULTRASOUND COMPLETE  Comparison: Ultrasound 11/27/2011, CT 11/22/2010 the  Findings:  Right Kidney = 12.7 cm.  7 mm  calculus within the upper pole right kidney.  No hydronephrosis.  Left kidney = 10.3 cm.  No hydronephrosis.  Bladder:  Bladder partially distended.  No irregularity identified.  2.3 cm gallstone noted within the gallbladder  IMPRESSION:  1.  Right nephrolithiasis. 2.  No evidence of hydronephrosis of the left or right.  Original Report Authenticated By: Genevive Bi, M.D.   Ct Biopsy  02/25/2012  *RADIOLOGY REPORT*  Clinical data: HIV, fever, pancytopenia  CT GUIDED DEEP ILIAC BONE ASPIRATION AND CORE BIOPSY:  Technique: Patient was placed  supine on the CT gantry and limited axial scans through the pelvis were obtained. Appropriate skin entry site was identified. Skin site was marked, prepped with Betadine,   draped in usual sterile fashion, and infiltrated locally with 1% lidocaine.  Intravenous Fentanyl and Versed were administered as conscious sedation during continuous cardiorespiratory monitoring by the radiology RN, with a total moderate sedation time of 40 minutes.  PelvicUnder CT fluoroscopic guidance an 11-gauge Cook trocar bone needle was advanced into the  iliac bone just lateral to the sacroiliac joint. Once needle tip position was confirmed, coaxial core and aspiration samples were obtained. The final sample was obtained using the guiding needle itself, which was then removed. Postprocedure scans show no hematoma or fracture. Patient tolerated procedure well, with no immediate complication.  IMPRESSION:  1. Technically successful CT guided  iliac bone core and aspiration biopsy, sample sent for surgical pathology and microbiology studies as requested.  Original Report Authenticated By: Osa Craver, M.D.    Admission HPI:  Mr. 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 He initially presented to the ED on 02/19/2012 (2 days ago) with complaints of fever, malaise and mild sore throat for 2 days and was subsequently treated for presumptive pharyngitis with Clindamycin. He returned to ED today and states that he feels no better since starting the antibiotics with continued fevers up to 102 and mild headache stating that this episode feels like his prior admissions for lymphadenopathy and fever. He reports compliance with Atripla therapy, denies chest pain, dyspnea, presyncope or syncope. He denies nausea, vomiting, hemoptysis, blood in urine or stools or change in bowel habits.  Hospital Course by problem list: # Fever associated with lymphadenopathy  Patient has had extensive workup previously (see February 2013 admission and discharge notes) for roughly a year long course of intermittent fevers and lymphadenopathy. To date no tests have been revealing, which includes multiple lymph node biopsies. To this point his pathology has thought to be secondary to immune reconstitution syndrome. This admission, the patient presented with daily fevers up to 102, and lymphadenopathy consistent with prior documentation. Because of the extensive prior workup, lab tests were kept to a minimum. Blood cultures were cone and were negative. Hematology/oncology was consulted for bone marrow biopsy, which was one test that had been deferred at this point. Biopsy was successfully performed and cytology, AFB cultures, and fungal cultures were collected. The patient will followup with heme/onc next week to go over these results. Please see their notes for full details, but they felt a biopsy was warranted given his worsening pancytopenia (WBC count lowering, anemia worse than usual, new thrombocytopenia). They also mentioned that he may be rheumatologic evaluation which can be pursued as an outpatient. Today, multiple viral tests, and fungal cultures, bacterial  cultures, and a multitude of other labs have all been unrevealing. This patient's workup will have to continue with close followup as an outpatient with his primary ID Dr., hematology, and the internal medicine clinics.  No antibiotics or steroids were given during this admission.  # Acute on chronic kidney disease  Patient does have known kidney disease with baseline creatinine around 1.2-1.5. He did have an episode of post renal failure from obstructive uropathy that required urologic intervention and has since resolved. He last  saw the [facility] in March of 2013, and had a stable creatinine and sub-nephrotic range proteinuria. During this admission, his creatinine at presentation was 2.5. It slowly trended down to 1.9 with IV fluids. Etiologies included prerenal secondary to find the patient in the setting of fevers, or intrarenal secondary to medicines.  Renal ultrasound showed nonobstructive stone only, no hydro. FeNa>1. He will get a followup BMP next week in clinic to document resolution of his kidney injury and he can make another appointment with his nephrologist if needed.   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 His baseline hemoglobin is between 10.5 and 12. This admission he remained around 8. His peripheral smear was unrevealing, and he was FOBT negative x2, including the day of discharge. He did receive a B12 shot. It is unclear if this represents progression of his anemia of chronic disease, or if this worsening anemia may be part of a pancytopenia and a bone marrow process. He will followup in internal medicine clinic for a followup CBC.   # Thrombocytopenia  Patient had stable reduction platelets, but decreased from previous numbers. No apparent bleeding. Could be part of pancytopenia picture vs HIT.  HIT panel was not collected.  # HIV Patient was compliant with a triple and dapsone.  Because of his acute renal injury, his atrial flow is separated into 3 components and renally dosed. He was discharged on renally dosed medicines. These can be adjusted as his renal function hopefully recovers.   # Diabetes, type II Patient with hyperglycemia and hemoglobin A1c of 6.6. Up from January 2013 of 5.7. Discussed this with patient, and agreed to try diet and exercise for glycemic control at the moment. We'll continue to follow. Will not initiate pharmacotherapy at this time given that goal A1c is 7 and patient is currently below that goal.  # Hyponatremia  Mild, resolved with IV fluids. Did trend down slightly at discharge, but this will be followed up in clinic.   # HTN  Stable and controlled on home Norvasc and labetolol.  Discharge Vitals:  BP 122/55  Pulse 90  Temp(Src) 98.6 F (37 C) (Oral)  Resp 18  Ht 6' (1.829 m)  Wt 246 lb 0.5 oz (111.6 kg)  BMI 33.37 kg/m2  SpO2 95%  Discharge Labs:  Results for orders placed during the hospital encounter of 02/22/12 (from the past 24 hour(s))  BASIC METABOLIC PANEL     Status: Abnormal   Collection Time   02/26/12  6:00 AM      Component Value Range   Sodium 131 (*) 135 - 145 (mEq/L)   Potassium 4.6  3.5 - 5.1 (mEq/L)   Chloride 102  96 - 112 (mEq/L)   CO2 20  19 - 32 (mEq/L)   Glucose, Bld 208 (*) 70 - 99 (mg/dL)   BUN 27 (*) 6 - 23 (mg/dL)   Creatinine, Ser 4.09 (*) 0.50 - 1.35 (mg/dL)   Calcium 8.3 (*) 8.4 - 10.5 (mg/dL)   GFR calc non Af Amer 39 (*) >90 (mL/min)   GFR calc Af Amer 46 (*) >90 (mL/min)  CBC     Status: Abnormal   Collection Time   02/26/12  6:00 AM      Component Value Range   WBC 2.8 (*) 4.0 - 10.5 (K/uL)   RBC 2.55 (*) 4.22 - 5.81 (MIL/uL)   Hemoglobin 7.5 (*) 13.0 - 17.0 (g/dL)  HCT 23.1 (*) 39.0 - 52.0 (%)   MCV 90.6  78.0 - 100.0 (fL)   MCH 29.4  26.0 - 34.0 (pg)   MCHC 32.5  30.0 - 36.0 (g/dL)   RDW 40.9 (*) 81.1 - 15.5 (%)   Platelets 117 (*) 150 - 400 (K/uL)  OCCULT BLOOD X 1 CARD TO  LAB, STOOL     Status: Normal   Collection Time   02/26/12 11:29 AM      Component Value Range   Fecal Occult Bld NEGATIVE      SignedAbner Greenspan, BEN 02/26/2012, 12:44 PM   Time Spent on Discharge: 25 min

## 2012-02-26 NOTE — Progress Notes (Signed)
Subjective: Afebrile overnight.  No c/o, feels pretty well.  Objective: Vital signs in last 24 hours: Filed Vitals:   02/25/12 1757 02/25/12 2200 02/26/12 0550 02/26/12 0937  BP: 128/67 121/56 104/62 122/55  Pulse: 93 98 90 90  Temp: 98 F (36.7 C) 98.1 F (36.7 C) 98.8 F (37.1 C) 98.6 F (37 C)  TempSrc: Oral Oral Oral Oral  Resp: 20 18 18 18   Height:      Weight:  246 lb 0.5 oz (111.6 kg)    SpO2: 97% 96% 95% 95%   Weight change: 3 lb 6.9 oz (1.557 kg)  Intake/Output Summary (Last 24 hours) at 02/26/12 1228 Last data filed at 02/26/12 0900  Gross per 24 hour  Intake    720 ml  Output      0 ml  Net    720 ml   Physical Exam GEN: NAD.  Alert and oriented x 3.  Pleasant, conversant, and cooperative to exam. RESP:  CTAB, no w/r/r CARDIOVASCULAR: RRR, S1, S2, 2/6 HSM across precordium, loudest at LUSB ABDOMEN: soft, NT/ND, NABS EXT: warm and dry, diffuse adenopathy as previously documented. No edema in b/l LE  Lab Results: Basic Metabolic Panel:  Lab 02/26/12 4098 02/25/12 0620  NA 131* 133*  K 4.6 4.6  CL 102 101  CO2 20 20  GLUCOSE 208* 178*  BUN 27* 27*  CREATININE 1.91* 2.04*  CALCIUM 8.3* 8.4  MG -- --  PHOS -- --   Liver Function Tests:  Lab 02/23/12 0417 02/19/12 2255  AST 21 16  ALT 20 11  ALKPHOS 58 78  BILITOT 0.2* 0.5  PROT 7.1 8.0  ALBUMIN 2.5* 3.2*   CBC:  Lab 02/26/12 0600 02/25/12 0620 02/23/12 0417 02/22/12 2330  WBC 2.8* 3.0* -- --  NEUTROABS -- -- 1.4* 1.7  HGB 7.5* 8.0* -- --  HCT 23.1* 24.1* -- --  MCV 90.6 90.3 -- --  PLT 117* 104* -- --   Anemia Panel:  Lab 02/23/12 0417  VITAMINB12 391  FOLATE 4.2  FERRITIN 682*  TIBC Not calculated due to Iron <10.  IRON <10*  RETICCTPCT 0.4   Urine Drug Screen: Drugs of Abuse     Component Value Date/Time   LABOPIA NONE DETECTED 11/19/2010 0348   COCAINSCRNUR NONE DETECTED 11/19/2010 0348   LABBENZ NONE DETECTED 11/19/2010 0348   AMPHETMU NONE DETECTED 11/19/2010 0348   THCU  NONE DETECTED 11/19/2010 0348   LABBARB  Value: NONE DETECTED        DRUG SCREEN FOR MEDICAL PURPOSES ONLY.  IF CONFIRMATION IS NEEDED FOR ANY PURPOSE, NOTIFY LAB WITHIN 5 DAYS.        LOWEST DETECTABLE LIMITS FOR URINE DRUG SCREEN Drug Class       Cutoff (ng/mL) Amphetamine      1000 Barbiturate      200 Benzodiazepine   200 Tricyclics       300 Opiates          300 Cocaine          300 THC              50 11/19/2010 0348    Urinalysis:  Lab 02/23/12 0300 02/19/12 2246  COLORURINE YELLOW AMBER*  LABSPEC 1.018 1.025  PHURINE 5.5 5.5  GLUCOSEU NEGATIVE NEGATIVE  HGBUR SMALL* MODERATE*  BILIRUBINUR SMALL* SMALL*  KETONESUR 15* 15*  PROTEINUR 100* >300*  UROBILINOGEN 0.2 1.0  NITRITE NEGATIVE NEGATIVE  LEUKOCYTESUR NEGATIVE NEGATIVE    Micro Results: Recent  Results (from the past 240 hour(s))  CULTURE, BLOOD (ROUTINE X 2)     Status: Normal   Collection Time   02/20/12  1:00 AM      Component Value Range Status Comment   Specimen Description BLOOD LEFT ARM   Final    Special Requests BOTTLES DRAWN AEROBIC AND ANAEROBIC [facility]   Final    Culture  Setup Time [id number]   Final    Culture NO GROWTH 5 DAYS   Final    Report Status 02/26/2012 FINAL   Final   CULTURE, BLOOD (ROUTINE X 2)     Status: Normal   Collection Time   02/20/12  1:15 AM      Component Value Range Status Comment   Specimen Description BLOOD RIGHT HAND   Final    Special Requests BOTTLES DRAWN AEROBIC AND ANAEROBIC [facility]   Final    Culture  Setup Time [id number]   Final    Culture NO GROWTH 5 DAYS   Final    Report Status 02/26/2012 FINAL   Final   RAPID STREP SCREEN     Status: Normal   Collection Time   02/20/12  1:30 AM      Component Value Range Status Comment   Streptococcus, Group A Screen (Direct) NEGATIVE  NEGATIVE  Final   CULTURE, BLOOD (ROUTINE X 2)     Status: Normal (Preliminary result)   Collection Time   02/22/12 11:30 PM      Component Value Range Status Comment   Specimen Description  BLOOD LEFT ARM   Final    Special Requests BOTTLES DRAWN AEROBIC AND ANAEROBIC 10CC [facility]   Final    Culture  Setup Time [id number]   Final    Culture     Final    Value:        BLOOD CULTURE RECEIVED NO GROWTH TO DATE CULTURE WILL BE HELD FOR 5 DAYS BEFORE ISSUING A FINAL NEGATIVE REPORT   Report Status PENDING   Incomplete   CULTURE, BLOOD (ROUTINE X 2)     Status: Normal (Preliminary result)   Collection Time   02/22/12 11:40 PM      Component Value Range Status Comment   Specimen Description BLOOD RIGHT ARM   Final    Special Requests BOTTLES DRAWN AEROBIC ONLY 10CC   Final    Culture  Setup Time [id number]   Final    Culture     Final    Value:        BLOOD CULTURE RECEIVED NO GROWTH TO DATE CULTURE WILL BE HELD FOR 5 DAYS BEFORE ISSUING A FINAL NEGATIVE REPORT   Report Status PENDING   Incomplete   MRSA PCR SCREENING     Status: Normal   Collection Time   02/23/12  3:13 PM      Component Value Range Status Comment   MRSA by PCR NEGATIVE  NEGATIVE  Final    Studies/Results: Ct Biopsy  02/25/2012  *RADIOLOGY REPORT*  Clinical data: HIV, fever, pancytopenia  CT GUIDED DEEP ILIAC BONE ASPIRATION AND CORE BIOPSY:  Technique: Patient was placed supine on the CT gantry and limited axial scans through the pelvis were obtained. Appropriate skin entry site was identified. Skin site was marked, prepped with Betadine,   draped in usual sterile fashion, and infiltrated locally with 1% lidocaine.  Intravenous Fentanyl and Versed were administered as conscious sedation during continuous cardiorespiratory monitoring by the radiology RN, with a total moderate sedation time  of 40 minutes.  PelvicUnder CT fluoroscopic guidance an 11-gauge Cook trocar bone needle was advanced into the  iliac bone just lateral to the sacroiliac joint. Once needle tip position was confirmed, coaxial core and aspiration samples were obtained. The final sample was obtained using the guiding needle itself, which was then  removed. Postprocedure scans show no hematoma or fracture. Patient tolerated procedure well, with no immediate complication.  IMPRESSION:  1. Technically successful CT guided  iliac bone core and aspiration biopsy, sample sent for surgical pathology and microbiology studies as requested.  Original Report Authenticated By: Osa Craver, M.D.   Medications: I have reviewed the patient's current medications. Scheduled Meds:    . amLODipine  10 mg Oral q morning - 10a  . cyanocobalamin  1,000 mcg Intramuscular Once  . dapsone  100 mg Oral q morning - 10a  . diphenhydrAMINE  25 mg Oral Q6H  . efavirenz  600 mg Oral QHS  . emtricitabine  200 mg Oral Q48H  . fentaNYL      . ferrous sulfate  325 mg Oral TID WC  . fluticasone  2 spray Each Nare Daily  . labetalol  200 mg Oral BID  . loratadine  10 mg Oral Daily  . midazolam      . polyethylene glycol  17 g Oral BID  . polyvinyl alcohol  1 drop Both Eyes BID  . pseudoephedrine  30 mg Oral Q6H  . tenofovir  300 mg Oral Q48H  . testosterone  10 g Transdermal q morning - 10a  . zolpidem  10 mg Oral Once  . DISCONTD: cyanocobalamin  1,000 mcg Intramuscular Q Mon   Continuous Infusions:    . sodium chloride 150 mL/hr (02/25/12 0902)   PRN Meds:.acetaminophen, acetaminophen, ondansetron (ZOFRAN) IV, ondansetron  Assessment/Plan: # Fever associated with lymphadenopathy S/p bone marrow bx yesterday, no complications.  Will f/u results next week with hematology.  Ddx includes rarer pathology given extensive w/u thus far. Please see their note for full details.  Afebrile overnight, stable for d/c today. - appreciate heme/onc rec's - bone marrow bx today-->send for cytology, AFB, fungal, no viral cultures - will f/u in heme onc office for results, likely next week - f/u blood culture X 2--> NGTD - No abx at present - Continue to follow fever curve  # Acute on chronic kidney disease Cr con't to trend down sightly, but still above  baseline.  Given poor po intake and rapidity of onset, volume depletion is a possibility, but medication side effect (Atripla) is also a consideration.  Renal U/S showed nonobstructive stone only, no hydro.  FeNa>1. Last clinic note from [facility] was unrevealing (sub nephrotic range proteinuria) - will need renal dosing of HIV meds, f/u in clinic - trend Cr  # Acute Anemia, normocytic Known history of B12 deficiency anemia and receives monthly B12 injections.  His baseline Hb is 10.5 to 12. He is down to 8's.  FOBT neg x 2 (most recent negative this morning), anemia panel shows Fe deficiency and likely ACD.  Concerning given worsening anemia with no obvious source. - Fe supplements - B12 injection today - f/u bone marrow aspirate - f/u in clinic next week for CBC  # Thrombocytopenia Stable but decrease from previous numbers. no apparent bleeding.  Could be part of pancytopenia picture vs HIT.  Will defer HIT panel for now. - d/c lovenox - Follow trend  # HIV Compliant with Atripla and Dapsone.  Need to  redose atripla drugs given renal failure. - con't renal dosed HAART meds  # Hyponatremia Resolved but now recurring slightly.  Will likely resolve with fliuds.  - follow up on orthostatics  - check urine electrolytes to further assess  - monitor BMET   # HTN Stable and controlled   - continue Norvasc and labetolol   # DVT Px - scd's  # Dispo - d/c today with close f/u   LOS: 4 days   Patrick [name], Patrick [name], 12:28 PM

## 2012-02-26 NOTE — Telephone Encounter (Signed)
called pt scheduled appt for 06/06 @ 8am

## 2012-02-26 NOTE — Progress Notes (Signed)
AVS reviewed with pt and wife at bedside. Pt remains stable. IV DC'd. Pt able to verbalize understanding of AVS with no questions. RX's sent to pt pharmacy by MD. Pt escorted to exit of facility via wheelchair. [place], Rosanna Randy

## 2012-02-29 LAB — CULTURE, BLOOD (ROUTINE X 2)
Culture  Setup Time: 201305280230
Culture  Setup Time: 201305280230
Culture: NO GROWTH
Culture: NO GROWTH

## 2012-03-03 ENCOUNTER — Ambulatory Visit: Payer: Medicaid Other

## 2012-03-03 ENCOUNTER — Ambulatory Visit (HOSPITAL_BASED_OUTPATIENT_CLINIC_OR_DEPARTMENT_OTHER): Payer: Medicaid Other | Admitting: Oncology

## 2012-03-03 ENCOUNTER — Encounter: Payer: Self-pay | Admitting: Oncology

## 2012-03-03 VITALS — BP 122/75 | HR 85 | Temp 97.2°F | Ht 72.0 in | Wt 240.4 lb

## 2012-03-03 DIAGNOSIS — D61818 Other pancytopenia: Secondary | ICD-10-CM

## 2012-03-03 DIAGNOSIS — Z21 Asymptomatic human immunodeficiency virus [HIV] infection status: Secondary | ICD-10-CM

## 2012-03-03 DIAGNOSIS — R509 Fever, unspecified: Secondary | ICD-10-CM

## 2012-03-03 DIAGNOSIS — R599 Enlarged lymph nodes, unspecified: Secondary | ICD-10-CM

## 2012-03-03 DIAGNOSIS — R5081 Fever presenting with conditions classified elsewhere: Secondary | ICD-10-CM

## 2012-03-03 NOTE — Progress Notes (Signed)
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   CURRENT THERAPY:  Watchful observation.   INTERVAL HISTORY: Patrick [name] 51 y.o. male returns for a hospital follow up appointment.  He was recently admitted again for fever, and diffuse adenopathy.  His pancytopenia was slightly worsened than baseline.  Thus, I was requested to render hematology consultation.  I evaluated patient on 02/24/2012.  Please refer to that note for detail.  Decision was made to pursue diagnostic bone marrow biopsy.  He returned to clinic today to go over the result.  [name] reported that he no longer has fever.  Normally, his fever lasts for about 7 days.  His diffuse adenopathy has slightly improved; however, he can still palpate them in the neck, supraclavicular, axillary, and inguinal areas bilaterally.  He has mild fatigue; however, he has returned back to work.   Patient denies headache, visual changes, confusion, drenching night sweats, mucositis, odynophagia, dysphagia, nausea vomiting, jaundice, chest pain, palpitation, shortness of breath, dyspnea on exertion, productive cough, gum bleeding, epistaxis, hematemesis, hemoptysis, abdominal pain, abdominal swelling, early satiety, melena, hematochezia, hematuria, skin rash, spontaneous bleeding, joint swelling, joint pain, heat or cold intolerance, bowel bladder incontinence, back pain, focal motor weakness, paresthesia, depression, suicidal or homocidal ideation, feeling hopelessness.   Past Medical History  Diagnosis Date  . Hypertension   . HIV (human immunodeficiency virus infection) 2010  . Hypogonadism male   . CKD (chronic kidney disease) stage 3, GFR 30-59 ml/min     hydrated cr 1.5 gfr 48  . Kidney stones    "quite a few times"  . High cholesterol   . Heart murmur   . Exertional dyspnea 02/25/12    "and lying down"  . Anemia   . Blood transfusion   . Hernia     "surgical; where appendix ruptured"    Past Surgical History  Procedure Date  . Lithotripsy 2010  . Appendectomy 2001    via midline incision  . Axillary lymph node dissection 2011; 2012    left    Current Outpatient Prescriptions  Medication Sig Dispense Refill  . amLODipine (NORVASC) 10 MG tablet Take 10 mg by mouth every morning.      . Artificial Tear Solution (TEARS RENEWED OP) Place 1 drop into both eyes 2 (two) times daily as needed. For dry eyes only when wearing contacts      . cetirizine (ZYRTEC) 10 MG tablet Take 10 mg by mouth daily as needed. For allergy symptoms      . cyanocobalamin (,VITAMIN B-12,) 1000 MCG/ML injection 1,000 mcg every 7 (seven) days. Takes on Monday      . dapsone 100 MG tablet Take 100 mg by mouth every morning.      Marland Kitchen efavirenz (SUSTIVA) 600 MG tablet Take 1 tablet (600 mg total) by mouth at bedtime.  30 tablet  2  . emtricitabine (EMTRIVA) 200 MG capsule Take 1 capsule (200 mg total) by mouth every other day.  16 capsule  2  . ferrous sulfate 325 (65 FE) MG tablet Take 1 tablet (325 mg total) by mouth 3 (three) times daily with meals.  90 tablet  5  . labetalol (NORMODYNE) 200 MG tablet Take 200 mg by mouth 2 (two) times daily.      Marland Kitchen  tenofovir (VIREAD) 300 MG tablet Take 1 tablet (300 mg total) by mouth every other day.  16 tablet  3  . Testosterone 20.25 MG/1.25GM (1.62%) GEL Place 100 mg onto the skin every morning.  1.25 g  3  . sodium chloride (OCEAN) 0.65 % nasal spray Place 1 spray into the nose daily as needed. For nasal congestion        ALLERGIES:  is allergic to codeine and sulfonamide derivatives.  REVIEW OF SYSTEMS:  The rest of the 14-point review of system was negative.   Filed Vitals:   03/03/12 0828  BP: 122/75  Pulse: 85  Temp: 97.2 F (36.2 C)   Wt Readings from  Last 3 Encounters:  03/03/12 240 lb 6.4 oz (109.045 kg)  02/25/12 246 lb 0.5 oz (111.6 kg)  01/19/12 231 lb (104.781 kg)   ECOG Performance status: 1  PHYSICAL EXAMINATION:   Gen: mildly obese man, in no acute distress. Eyes: No scleral icterus or jaundice. ENT: There was no oropharyngeal lesions. Neck was supple without thyromegaly. Lymphatics: Positive for cervical, supraclavicular, axillary, and inguinal adenopathy.  The largest one was in the right inguinal area about 3cm.  Respiratory: Lungs were clear bilaterally without wheezing or crackles. Cardiovascular: normal heart rate and rhythm; S1/S2; without murmur, rubs, or gallop. There was no pedal edema. GI: Abdomen was soft, flat, nontender, nondistended.  Splenic tip was about 5 cm below costal margin.  Musculoskeletal exam: No spinal tenderness on palpation of vertebral spine. Skin exam was without ecchymosis, petechiae. Neuro exam was nonfocal. Patient was able to get on and off exam table without assistance. Gait was normal. Patient was alert and oriented. Attention was good. Language was appropriate. Mood was normal without depression. Speech was not pressured. Thought content was not tangential.     LABORATORY/RADIOLOGY DATA:  Lab Results  Component Value Date   WBC 2.8* 02/26/2012   HGB 7.5* 02/26/2012   HCT 23.1* 02/26/2012   PLT 117* 02/26/2012   GLUCOSE 208* 02/26/2012   CHOL 91 10/06/2011   TRIG 232* 10/06/2011   HDL Comment: <9 10/06/2011   LDLCALC NOT CALC 10/06/2011   ALKPHOS 58 02/23/2012   ALT 20 02/23/2012   AST 21 02/23/2012   NA 131* 02/26/2012   K 4.6 02/26/2012   CL 102 02/26/2012   CREATININE 1.91* 02/26/2012   BUN 27* 02/26/2012   CO2 20 02/26/2012   INR 1.35 02/25/2012   HGBA1C 6.6* 02/25/2012     ASSESSMENT AND PLAN:    1. Pancytopenia:  -Working diagnosis:  HIV-related.  Bone marrow biopsy showed slight hypercellularity with some dyspoiesis.  However, it was noted that dyspoiesis can be seen in HIV.  I have pending  cytogenetics to ensure that he does not have mutations that are consistent with myelo dysplastic syndrome (MDS).  His pancytopenia can also be autoimmune since there is a correlation with worsening pancytopenia when he presents with fever and flare of adenopathy.  If his cytogenetics come back abnormal or consistent with MDS, I will refer him to a tertiary center for evaluation since it is not easy to distinguish dysplasia from HIV from that of MDS.   - Treatment:  None needed for his pancytopenia.  I encourage him to be adherent to his HIV regimen.   2. Fever of unknown origin:  He is afebrile now.  - Differential diagnosis: Most likely benign as extensive micro work up has been negative. I cannot rule out autoimmune disease but ANA was negative in  10/2010. Cannot rule out Still's disease (fever, adenopathy, elevated ferritin; however, he lacks the common skin rash associated with Still).  - I sent bone marrow aspirate for AFB, fungal, viral cultures.  This are still negative to date.  I advised him to follow up with his HIV physician for final result of these cultures after 6 weeks.  - I defer to his PCP to see if outpatient Rheum referral is appropriate to rule out other causes of fever of unknown origin.   3. Lymphadenopathy:  It is slightly improved now compared to 2 weeks ago.  - Most likely reactive. Left axillary nodes biopsy x2 were negative. I have low clinical suspicion for a primary lymphoproliferative process since his adenopathy is intermittent and not progressive. He was ruled out in the past for granulomatous disease and his ACE level was normal.  - No repeat node excision is indicated unless he develops persistent, (instead of intermittent) and progressive adenopathy.   4. Splenomegaly: Like #3. There was no sign of splenic abscess on previous CT's scan and pt appeared too healthy for an abscess. I do not advocate splenectomy due to high risk of bleeding and infection and his splenomegaly  is not symptomatic.  It is unlikely due to lymphoma since bone marrow biopsy was negative for lymphoma.   5. Dispo:  Discharged from the Cancer Center.  My service is always available in the future if the need arises.     The length of time of the face-to-face encounter was 25 minutes. More than 50% of time was spent counseling and coordination of care.

## 2012-03-03 NOTE — Patient Instructions (Signed)
A. Issues:   Fever and node swelling:  Most likely reactive.  Bone marrow biopsy (prelim result) was negative; we're waiting for the cytogenetics test to see if there is any subtle mutation.  B.  Follow up:  With you HIV physician to ensure that the final cultures from bone marrow biopsy is negative in July 2013.  C.  Consider discussing with your HIV physician or primary physician to see if referral to a rheumatologist (for your intermittent fever) is appropriate.   D.  Discharge from the Cancer Center.  Our service is always available in the future if the need ever arises.    Thank you.

## 2012-03-03 NOTE — Progress Notes (Signed)
Patient came in this morning as a new patient he has one insurance Medicaid,he said he should be [place] with his Medicaid.

## 2012-03-04 ENCOUNTER — Encounter: Payer: Self-pay | Admitting: Internal Medicine

## 2012-03-04 ENCOUNTER — Ambulatory Visit (INDEPENDENT_AMBULATORY_CARE_PROVIDER_SITE_OTHER): Payer: Medicaid Other | Admitting: Internal Medicine

## 2012-03-04 VITALS — BP 120/68 | HR 73 | Temp 97.7°F | Ht 72.0 in | Wt 239.6 lb

## 2012-03-04 DIAGNOSIS — N289 Disorder of kidney and ureter, unspecified: Secondary | ICD-10-CM

## 2012-03-04 DIAGNOSIS — I10 Essential (primary) hypertension: Secondary | ICD-10-CM

## 2012-03-04 DIAGNOSIS — D61818 Other pancytopenia: Secondary | ICD-10-CM

## 2012-03-04 DIAGNOSIS — I129 Hypertensive chronic kidney disease with stage 1 through stage 4 chronic kidney disease, or unspecified chronic kidney disease: Secondary | ICD-10-CM

## 2012-03-04 DIAGNOSIS — B20 Human immunodeficiency virus [HIV] disease: Secondary | ICD-10-CM

## 2012-03-04 DIAGNOSIS — R599 Enlarged lymph nodes, unspecified: Secondary | ICD-10-CM

## 2012-03-04 DIAGNOSIS — R509 Fever, unspecified: Secondary | ICD-10-CM

## 2012-03-04 DIAGNOSIS — N189 Chronic kidney disease, unspecified: Secondary | ICD-10-CM

## 2012-03-04 DIAGNOSIS — E139 Other specified diabetes mellitus without complications: Secondary | ICD-10-CM

## 2012-03-04 DIAGNOSIS — E119 Type 2 diabetes mellitus without complications: Secondary | ICD-10-CM

## 2012-03-04 DIAGNOSIS — N179 Acute kidney failure, unspecified: Secondary | ICD-10-CM

## 2012-03-04 LAB — BASIC METABOLIC PANEL WITH GFR
BUN: 16 mg/dL (ref 6–23)
CO2: 25 mEq/L (ref 19–32)
Calcium: 8.9 mg/dL (ref 8.4–10.5)
Chloride: 103 mEq/L (ref 96–112)
Creat: 1.51 mg/dL — ABNORMAL HIGH (ref 0.50–1.35)
Glucose, Bld: 135 mg/dL — ABNORMAL HIGH (ref 70–99)

## 2012-03-04 LAB — GLUCOSE, CAPILLARY

## 2012-03-04 NOTE — Assessment & Plan Note (Signed)
Well controlled on labetalol and Norvasc. I am concerned that his peripheral edema may be a side effect of Norvasc. We will try stopping the Norvasc, continue labetalol, and consider starting lisinopril pending his BMP drawn today. Lisinopril would be a good choice given his diabetes. - Hold Norvasc - Continue labetalol - Consider lisinopril pending BMP results

## 2012-03-04 NOTE — Assessment & Plan Note (Signed)
Followed by [facility] and Dr. Orvan Falconer. Because of worsening kidney injury during last week's admission, the patient is currently on renally dosed HIV medicines. We will check a BMP today and redose as appropriate. CD4 count 210, viral load less than 20 in April 2013. - Continue renally dosed HIV medicine - Followup with Dr. Orvan Falconer next week as scheduled

## 2012-03-04 NOTE — Assessment & Plan Note (Signed)
Bone marrow biopsy done during admission showed reactive polyclonal plasmacytosis only. It was thought nonspecific, likely secondary to HIV. No overt malignant disease. - Followup cytogenetics

## 2012-03-04 NOTE — Progress Notes (Signed)
Subjective:     Patient ID: Patrick [name], male   DOB: 03/27/1961, 51 y.o.   MRN: [id number]  HPI Patient is a very pleasant 51 year old gentleman with a history of HIV, chronic kidney disease, hypertension, and recent waxing and waning fevers and lymphadenopathy. He presents for followup.  Since she was discharged last week for his fevers and lymphadenopathy, the patient reports no further episodes of fever. The results from his bone marrow biopsy are so far negative. All pending cultures are still negative. He does complain of some fatigue with bloating, feeling like he is carrying around extra fluid. He saw Dr. Gaylyn Rong yesterday oncology for followup of his results.  Review of Systems No chest pain, shortness of breath, palpitations. No abdominal pain.    Objective:   Physical Exam GEN: NAD.  Alert and oriented x 3.  Pleasant, conversant, and cooperative to exam. RESP:  CTAB, no w/r/r CARDIOVASCULAR: RRR, S1, S2, 2/6 HSM @ LUSB ABDOMEN: soft, NT/ND, NABS EXT: warm and dry. 1+ pitting edema in b/l legs to knees, 2+ puleses in b/l TP/DP     Assessment:         Plan:

## 2012-03-04 NOTE — Assessment & Plan Note (Signed)
Patient recently discharged from the hospital for another episode of cycling fevers with lymphadenopathy. During that admission a bone marrow biopsy was completed, with the results all negative thus far. Cytogenetics are still pending. Fungal, AFB, and viral cultures are all no growth to date. Patient followed up yesterday with oncology, who has signed off. They had recommended possible rheumatologic consult, which I am open to. This has previously been thought secondary to immune reconstitution, though it remains a very atypical course. - Followup bone marrow cytology - Rheumatology referral - Followup with ID clinic

## 2012-03-04 NOTE — Assessment & Plan Note (Signed)
Hemoglobin A1c during admission last week was 6.6. Patient has gained a little bit of weight recently. His A1 C. was 6.5 roughly 3 years ago but has been in the 5's since. As discussed during his auscultation, we will not initiate pharmacotherapy. - Encourage diet and exercise - Recheck A1c in 3 months

## 2012-03-04 NOTE — Assessment & Plan Note (Signed)
Patient does have known chronic kidney disease with a baseline of roughly 1.5. In the hospital, his creatinine was elevated to up to 2.5. He was hydrated with IV fluids and his creatinine at discharge was 1.9. We will recheck a BMP today. - Check BMP today - Consider nephrology appointment (already an established patient)

## 2012-03-07 ENCOUNTER — Telehealth: Payer: Self-pay | Admitting: Internal Medicine

## 2012-03-07 DIAGNOSIS — E875 Hyperkalemia: Secondary | ICD-10-CM

## 2012-03-07 LAB — VIRAL CULTURE VIRC

## 2012-03-07 MED ORDER — EFAVIRENZ-EMTRICITAB-TENOFOVIR 600-200-300 MG PO TABS: 1.0000 | ORAL_TABLET | Freq: Every day | ORAL | Status: DC

## 2012-03-07 NOTE — Telephone Encounter (Signed)
Called pt to discuss lab results.  Cr has improved to where renal dosing of HIV meds is no longer necessary.  K was slightly elevated, a pt denies any diet changes including tomatoes, salt substitute, bananas, or gatorade.  Plan: - restart atripla at normal dosing - recheck K on Thursday (will be at [facility] seeing [facility] that day anyway)

## 2012-03-10 ENCOUNTER — Inpatient Hospital Stay: Payer: Medicaid Other | Admitting: Internal Medicine

## 2012-03-11 ENCOUNTER — Telehealth: Payer: Self-pay | Admitting: Oncology

## 2012-03-11 NOTE — Telephone Encounter (Signed)
I left a message on patient's cell phone.  His bone marrow's cytogenetics were negative.  His cultures from bone marrow aspirate was still negative for AFB, fungal, viral.  Therefore his low blood counts and fever were most likely reactive.  There was no concern for primary bone marrow disease such as leukemia, lymphoma, MDS at this time. I recommended that he follows with his primary doctor and HIV Dr.

## 2012-03-14 ENCOUNTER — Encounter: Payer: Self-pay | Admitting: Internal Medicine

## 2012-03-14 ENCOUNTER — Other Ambulatory Visit: Payer: Medicaid Other

## 2012-03-14 ENCOUNTER — Ambulatory Visit (INDEPENDENT_AMBULATORY_CARE_PROVIDER_SITE_OTHER): Payer: Medicaid Other | Admitting: Internal Medicine

## 2012-03-14 ENCOUNTER — Telehealth: Payer: Self-pay | Admitting: *Deleted

## 2012-03-14 ENCOUNTER — Inpatient Hospital Stay: Payer: Medicaid Other | Admitting: Internal Medicine

## 2012-03-14 VITALS — BP 123/82 | HR 78 | Temp 98.4°F | Ht 72.0 in | Wt 222.5 lb

## 2012-03-14 DIAGNOSIS — E875 Hyperkalemia: Secondary | ICD-10-CM

## 2012-03-14 DIAGNOSIS — B20 Human immunodeficiency virus [HIV] disease: Secondary | ICD-10-CM

## 2012-03-14 LAB — BASIC METABOLIC PANEL WITH GFR
BUN: 26 mg/dL — ABNORMAL HIGH (ref 6–23)
CO2: 23 mEq/L (ref 19–32)
Calcium: 9.8 mg/dL (ref 8.4–10.5)
Creat: 1.63 mg/dL — ABNORMAL HIGH (ref 0.50–1.35)
GFR, Est African American: 56 mL/min — ABNORMAL LOW
Glucose, Bld: 191 mg/dL — ABNORMAL HIGH (ref 70–99)
Sodium: 134 mEq/L — ABNORMAL LOW (ref 135–145)

## 2012-03-14 NOTE — Progress Notes (Signed)
Patient ID: Patrick [name], male   DOB: [birth year], 51 y.o.   MRN: [id number]     [facility] for Infectious Disease  Patient Active Problem List  Diagnosis  . HIV DISEASE  . DYSLIPIDEMIA  . GOUT, UNSPECIFIED  . OBESITY  . HYPERTENSION  . SECONDARY HYPERPARATHYROIDISM  . NEPHROLITHIASIS  . BACTEREMIA, MSSA  . ADVERSE DRUG REACTION, SULFA  . PERSONAL HX OF METHICILLIN RESIST STAPH AUREUS  . Personal history of other diseases of digestive disease  . LYMPHADENOPATHY, DIFFUSE  . Hypogonadism male  . 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 CRI (chronic renal insufficiency)  . Anemia B twelve deficiency  . Encounter for screening colonoscopy  . Pancytopenia with fever  . Hyponatremia  . Diabetes, Type II    Patient's Medications  New Prescriptions   No medications on file  Previous Medications   ARTIFICIAL TEAR SOLUTION (TEARS RENEWED OP)    Place 1 drop into both eyes 2 (two) times daily as needed. For dry eyes only when wearing contacts   CYANOCOBALAMIN (,VITAMIN B-12,) 1000 MCG/ML INJECTION    1,000 mcg every 7 (seven) days. Takes on Monday   DAPSONE 100 MG TABLET    Take 100 mg by mouth every morning.   EFAVIRENZ-EMTRICTABINE-TENOFOVIR (ATRIPLA) 600-200-300 MG PER TABLET    Take 1 tablet by mouth at bedtime.   FERROUS SULFATE 325 (65 FE) MG TABLET    Take 1 tablet (325 mg total) by mouth 3 (three) times daily with meals.   LABETALOL (NORMODYNE) 200 MG TABLET    Take 200 mg by mouth 2 (two) times daily.   SODIUM CHLORIDE (OCEAN) 0.65 % NASAL SPRAY    Place 1 spray into the nose daily as needed. For nasal congestion   TESTOSTERONE 20.25 MG/1.25GM (1.62%) GEL    Place 100 mg onto the skin every morning.  Modified Medications   No medications on file  Discontinued Medications   AMLODIPINE (NORVASC) 10 MG TABLET    Take 10 mg by mouth every morning.   CETIRIZINE (ZYRTEC) 10 MG TABLET    Take 10 mg by mouth daily as needed. For allergy symptoms     Subjective: Patrick [name] is in for his hospital followup visit. He was rehospitalized last month again with another relapse of his fever and lymphadenopathy. He has now had 6 episodes dating back to February of 2012 requiring 4 hospitalizations. He has had 2 left axillary node biopsies and most recently a bone marrow biopsy. No evidence of malignancy has been found and all biopsies were compatible with benign reactive changes related to HIV infection. 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 He is feeling better now in his lymph nodes have decreased in size. He has had no further fever since discharge.  Objective: Temp: 98.4 F (36.9 C) (06/17 1553) Temp src: Oral (06/17 1553) BP: 123/82 mmHg (06/17 1553) Pulse Rate: 78  (06/17 1553)  General: He is in good spirits Skin: No rash Lymph nodes: He has rubbery axillary, left supraclavicular and submandibular adenopathy. These have decreased in size Lungs: Clear Cor: Regular S1 and S2 no murmurs Abdomen: Obese, soft and nontender. His spleen tip is palpable.  Lab Results HIV 1 RNA Quant (copies/mL)  Date Value  01/05/2012 <20   11/22/2011 <20   10/06/2011 <20      CD4 T Cell Abs (cmm)  Date Value  01/05/2012 210*  11/22/2011 110*  10/06/2011 220*     Assessment: They're still no clear explanation why he would continue to have recurrent fever and lymphadenopathy over 3 years into his antiretroviral therapy. He has had good viral suppression throughout his course. Although he has not had complete CD4 reconstitution it would be unusual for him to continue to have IRIS for this length of time. I think it is okay for him to go ahead with scheduled surgery for his abdominal hernia and screening colonoscopy. His acute on chronic renal insufficiency has improved and is reasonable and safe to continue him on Atripla for  now.  Plan: 1. Continue Atripla 2. Followup after lab work in 6 weeks   Cliffton Asters, MD [facility] for Infectious Disease [facility] [phone] pager   [phone] cell 03/14/2012, 4:54 PM

## 2012-03-14 NOTE — Telephone Encounter (Signed)
Pt coming for appt.

## 2012-03-16 ENCOUNTER — Encounter: Payer: Medicaid Other | Admitting: Internal Medicine

## 2012-03-18 ENCOUNTER — Encounter: Payer: Self-pay | Admitting: Oncology

## 2012-03-22 LAB — FUNGUS CULTURE W SMEAR

## 2012-04-05 ENCOUNTER — Other Ambulatory Visit: Payer: Medicaid Other

## 2012-04-08 LAB — AFB CULTURE WITH SMEAR (NOT AT ARMC): Acid Fast Smear: NONE SEEN

## 2012-04-19 ENCOUNTER — Ambulatory Visit: Payer: Medicaid Other | Admitting: Internal Medicine

## 2012-04-25 ENCOUNTER — Telehealth: Payer: Self-pay | Admitting: *Deleted

## 2012-04-25 NOTE — Telephone Encounter (Signed)
Pt hospitalized at time of Dental Appt on 02/25/12.  Wondering where he was in the schedule.  RN to talk with Dental scheduling secretary at [facility] tomorrow morning to find out.  Will call the pt back.

## 2012-04-27 NOTE — Telephone Encounter (Signed)
RN spoke w/ Engineer, production.  Has placed the pt back for dental appt.  Phone call to pt.  Message left with this information.

## 2012-05-03 ENCOUNTER — Other Ambulatory Visit: Payer: Medicaid Other

## 2012-05-05 ENCOUNTER — Other Ambulatory Visit (INDEPENDENT_AMBULATORY_CARE_PROVIDER_SITE_OTHER): Payer: Medicaid Other

## 2012-05-05 DIAGNOSIS — B20 Human immunodeficiency virus [HIV] disease: Secondary | ICD-10-CM

## 2012-05-05 LAB — CBC
HCT: 37.1 % — ABNORMAL LOW (ref 39.0–52.0)
MCV: 89.2 fL (ref 78.0–100.0)
RBC: 4.16 MIL/uL — ABNORMAL LOW (ref 4.22–5.81)
WBC: 6.4 10*3/uL (ref 4.0–10.5)

## 2012-05-06 LAB — T-HELPER CELL (CD4) - (RCID CLINIC ONLY)
CD4 % Helper T Cell: 14 % — ABNORMAL LOW (ref 33–55)
CD4 T Cell Abs: 370 uL — ABNORMAL LOW (ref 400–2700)

## 2012-05-06 LAB — COMPLETE METABOLIC PANEL WITH GFR
BUN: 21 mg/dL (ref 6–23)
CO2: 24 mEq/L (ref 19–32)
Calcium: 9.7 mg/dL (ref 8.4–10.5)
Chloride: 102 mEq/L (ref 96–112)
Creat: 1.2 mg/dL (ref 0.50–1.35)
GFR, Est African American: 81 mL/min
Glucose, Bld: 237 mg/dL — ABNORMAL HIGH (ref 70–99)

## 2012-05-17 ENCOUNTER — Ambulatory Visit (INDEPENDENT_AMBULATORY_CARE_PROVIDER_SITE_OTHER): Payer: Medicaid Other | Admitting: Internal Medicine

## 2012-05-17 ENCOUNTER — Encounter: Payer: Self-pay | Admitting: Internal Medicine

## 2012-05-17 VITALS — BP 149/81 | HR 100 | Temp 98.3°F | Ht 72.0 in | Wt 238.2 lb

## 2012-05-17 DIAGNOSIS — B20 Human immunodeficiency virus [HIV] disease: Secondary | ICD-10-CM

## 2012-05-17 NOTE — Progress Notes (Signed)
Patient ID: Patrick [name], male   DOB: 10-12-1960, 51 y.o.   MRN: [id number]     [facility] for Infectious Disease  Patient Active Problem List  Diagnosis  . HIV DISEASE  . DYSLIPIDEMIA  . GOUT, UNSPECIFIED  . OBESITY  . HYPERTENSION  . SECONDARY HYPERPARATHYROIDISM  . NEPHROLITHIASIS  . BACTEREMIA, MSSA  . ADVERSE DRUG REACTION, SULFA  . PERSONAL HX OF METHICILLIN RESIST STAPH AUREUS  . Personal history of other diseases of digestive disease  . LYMPHADENOPATHY, DIFFUSE  . Hypogonadism male  . 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 CRI (chronic renal insufficiency)  . Anemia B twelve deficiency  . Encounter for screening colonoscopy  . Pancytopenia with fever  . Hyponatremia  . Diabetes, Type II    Patient's Medications  New Prescriptions   No medications on file  Previous Medications   CYANOCOBALAMIN (,VITAMIN B-12,) 1000 MCG/ML INJECTION    1,000 mcg every 7 (seven) days. Takes on Monday   DAPSONE 100 MG TABLET    Take 100 mg by mouth every morning.   EFAVIRENZ-EMTRICTABINE-TENOFOVIR (ATRIPLA) 600-200-300 MG PER TABLET    Take 1 tablet by mouth at bedtime.   FERROUS SULFATE 325 (65 FE) MG TABLET    Take 1 tablet (325 mg total) by mouth 3 (three) times daily with meals.   LABETALOL (NORMODYNE) 200 MG TABLET    Take 200 mg by mouth 2 (two) times daily.   TESTOSTERONE 20.25 MG/1.25GM (1.62%) GEL    Place 100 mg onto the skin every morning.  Modified Medications   No medications on file  Discontinued Medications   ARTIFICIAL TEAR SOLUTION (TEARS RENEWED OP)    Place 1 drop into both eyes 2 (two) times daily as needed. For dry eyes only when wearing contacts   SODIUM CHLORIDE (OCEAN) 0.65 % NASAL SPRAY    Place 1 spray into the nose daily as needed. For nasal congestion    Subjective: Patrick [name] is in for his routine visit. He has not missed any doses of his Atripla. He has not had any further problems with fever or increasing adenopathy. He is feeling  well.  Objective: Temp: 98.3 F (36.8 C) (08/20 0941) Temp src: Oral (08/20 0941) BP: 149/81 mmHg (08/20 0941) Pulse Rate: 100  (08/20 0941)  General: He appears well Skin: No rash Lungs: Clear Cor: Regular S1 and S2 no murmurs  Lab Results HIV 1 RNA Quant (copies/mL)  Date Value  05/05/2012 <20   01/05/2012 <20   11/22/2011 <20      CD4 T Cell Abs (cmm)  Date Value  05/05/2012 370*  01/05/2012 210*  11/22/2011 110*     Assessment: His viral load remains undetectable and his CD4 has rebounded promptly to 370. I will stop his dapsone PCP prophylaxis and continue Atripla. His creatinine has returned to normal and his hemoglobin is up significantly.  Plan: 1. Continue Atripla 2. Discontinue dapsone 3. Followup after lab work in 3 months   Patrick Asters, MD [facility] for Infectious Disease [facility] [phone] pager   [phone] cell 05/17/2012, 9:57 AM

## 2012-06-05 ENCOUNTER — Inpatient Hospital Stay (HOSPITAL_COMMUNITY)
Admission: EM | Admit: 2012-06-05 | Discharge: 2012-06-15 | DRG: 682 | Disposition: A | Discharge status: Home / Self Care | Payer: Medicaid Other | Attending: Internal Medicine | Admitting: Internal Medicine

## 2012-06-05 ENCOUNTER — Encounter (HOSPITAL_COMMUNITY): Payer: Self-pay | Admitting: Adult Health

## 2012-06-05 ENCOUNTER — Emergency Department (HOSPITAL_COMMUNITY): Payer: Medicaid Other

## 2012-06-05 DIAGNOSIS — B343 Parvovirus infection, unspecified: Secondary | ICD-10-CM

## 2012-06-05 DIAGNOSIS — R599 Enlarged lymph nodes, unspecified: Secondary | ICD-10-CM

## 2012-06-05 DIAGNOSIS — N170 Acute kidney failure with tubular necrosis: Principal | ICD-10-CM | POA: Diagnosis present

## 2012-06-05 DIAGNOSIS — D519 Vitamin B12 deficiency anemia, unspecified: Secondary | ICD-10-CM

## 2012-06-05 DIAGNOSIS — D61818 Other pancytopenia: Secondary | ICD-10-CM | POA: Diagnosis present

## 2012-06-05 DIAGNOSIS — N189 Chronic kidney disease, unspecified: Secondary | ICD-10-CM | POA: Diagnosis present

## 2012-06-05 DIAGNOSIS — Z882 Allergy status to sulfonamides status: Secondary | ICD-10-CM

## 2012-06-05 DIAGNOSIS — N179 Acute kidney failure, unspecified: Secondary | ICD-10-CM | POA: Diagnosis present

## 2012-06-05 DIAGNOSIS — D599 Acquired hemolytic anemia, unspecified: Secondary | ICD-10-CM | POA: Diagnosis present

## 2012-06-05 DIAGNOSIS — E871 Hypo-osmolality and hyponatremia: Secondary | ICD-10-CM | POA: Diagnosis present

## 2012-06-05 DIAGNOSIS — E119 Type 2 diabetes mellitus without complications: Secondary | ICD-10-CM | POA: Diagnosis present

## 2012-06-05 DIAGNOSIS — Z9089 Acquired absence of other organs: Secondary | ICD-10-CM

## 2012-06-05 DIAGNOSIS — I129 Hypertensive chronic kidney disease with stage 1 through stage 4 chronic kidney disease, or unspecified chronic kidney disease: Secondary | ICD-10-CM | POA: Diagnosis present

## 2012-06-05 DIAGNOSIS — R509 Fever, unspecified: Secondary | ICD-10-CM

## 2012-06-05 DIAGNOSIS — R161 Splenomegaly, not elsewhere classified: Secondary | ICD-10-CM | POA: Diagnosis present

## 2012-06-05 DIAGNOSIS — E785 Hyperlipidemia, unspecified: Secondary | ICD-10-CM

## 2012-06-05 DIAGNOSIS — B20 Human immunodeficiency virus [HIV] disease: Secondary | ICD-10-CM | POA: Diagnosis present

## 2012-06-05 DIAGNOSIS — E669 Obesity, unspecified: Secondary | ICD-10-CM

## 2012-06-05 DIAGNOSIS — D5911 Warm autoimmune hemolytic anemia: Secondary | ICD-10-CM

## 2012-06-05 DIAGNOSIS — I10 Essential (primary) hypertension: Secondary | ICD-10-CM | POA: Diagnosis present

## 2012-06-05 DIAGNOSIS — E139 Other specified diabetes mellitus without complications: Secondary | ICD-10-CM

## 2012-06-05 DIAGNOSIS — N2581 Secondary hyperparathyroidism of renal origin: Secondary | ICD-10-CM

## 2012-06-05 DIAGNOSIS — D589 Hereditary hemolytic anemia, unspecified: Secondary | ICD-10-CM

## 2012-06-05 DIAGNOSIS — N200 Calculus of kidney: Secondary | ICD-10-CM | POA: Diagnosis present

## 2012-06-05 LAB — CBC WITH DIFFERENTIAL/PLATELET
Basophils Absolute: 0 10*3/uL (ref 0.0–0.1)
Basophils Relative: 1 % (ref 0–1)
Basophils Relative: 0 % (ref 0–1)
Eosinophils Absolute: 0.1 10*3/uL (ref 0.0–0.7)
Eosinophils Relative: 2 % (ref 0–5)
HCT: 30.8 % — ABNORMAL LOW (ref 39.0–52.0)
HCT: 28.5 % — ABNORMAL LOW (ref 39.0–52.0)
Hemoglobin: 10.3 g/dL — ABNORMAL LOW (ref 13.0–17.0)
Hemoglobin: 9.5 g/dL — ABNORMAL LOW (ref 13.0–17.0)
Lymphocytes Relative: 29 % (ref 12–46)
Lymphs Abs: 1.1 10*3/uL (ref 0.7–4.0)
MCH: 29.7 pg (ref 26.0–34.0)
MCHC: 33.4 g/dL (ref 30.0–36.0)
Monocytes Absolute: 0.8 10*3/uL (ref 0.1–1.0)
Monocytes Absolute: 0.8 10*3/uL (ref 0.1–1.0)
Monocytes Relative: 19 % — ABNORMAL HIGH (ref 3–12)
Monocytes Relative: 20 % — ABNORMAL HIGH (ref 3–12)
Neutro Abs: 1.9 10*3/uL (ref 1.7–7.7)
Neutrophils Relative %: 48 % (ref 43–77)
RBC: 3.21 MIL/uL — ABNORMAL LOW (ref 4.22–5.81)
RDW: 14.8 % (ref 11.5–15.5)
WBC: 3.8 10*3/uL — ABNORMAL LOW (ref 4.0–10.5)

## 2012-06-05 LAB — BASIC METABOLIC PANEL
BUN: 33 mg/dL — ABNORMAL HIGH (ref 6–23)
CO2: 23 mEq/L (ref 19–32)
Chloride: 94 mEq/L — ABNORMAL LOW (ref 96–112)
Creatinine, Ser: 2.59 mg/dL — ABNORMAL HIGH (ref 0.50–1.35)
GFR calc Af Amer: 32 mL/min — ABNORMAL LOW (ref >90)
Glucose, Bld: 367 mg/dL — ABNORMAL HIGH (ref 70–99)
Potassium: 4.1 mEq/L (ref 3.5–5.1)

## 2012-06-05 LAB — URINALYSIS, ROUTINE W REFLEX MICROSCOPIC
Glucose, UA: NEGATIVE mg/dL
Ketones, ur: 15 mg/dL — AB
Leukocytes, UA: NEGATIVE
pH: 5 (ref 5.0–8.0)

## 2012-06-05 LAB — COMPREHENSIVE METABOLIC PANEL
ALT: 13 U/L (ref 0–53)
AST: 14 U/L (ref 0–37)
Alkaline Phosphatase: 61 U/L (ref 39–117)
CO2: 22 mEq/L (ref 19–32)
Calcium: 9.1 mg/dL (ref 8.4–10.5)
GFR calc Af Amer: 35 mL/min — ABNORMAL LOW (ref >90)
Glucose, Bld: 278 mg/dL — ABNORMAL HIGH (ref 70–99)
Potassium: 4.5 mEq/L (ref 3.5–5.1)
Sodium: 130 mEq/L — ABNORMAL LOW (ref 135–145)
Total Protein: 7.8 g/dL (ref 6.0–8.3)

## 2012-06-05 LAB — URINE MICROSCOPIC-ADD ON

## 2012-06-05 MED ORDER — ACETAMINOPHEN 325 MG PO TABS
650.0000 mg | ORAL_TABLET | Freq: Once | ORAL | Status: AC
  Administered 2012-06-05: 650 mg via ORAL
  Filled 2012-06-05: qty 2

## 2012-06-05 NOTE — ED Notes (Signed)
HIV pt presenting with fever of over 101 at home, enlarged lymph nodes, congestion, cough with production and dizziness. C/o right groin pain and left sided abdominal pain.  Pale, in color.

## 2012-06-05 NOTE — ED Notes (Signed)
The pt is c/o enlarged lymph nodes all over his body since Wednesday high temp since Thursday.  His last tylenol was 1600.  Temp elevated now.  He is also c/o exertional sob

## 2012-06-06 ENCOUNTER — Encounter (HOSPITAL_COMMUNITY): Payer: Self-pay | Admitting: *Deleted

## 2012-06-06 DIAGNOSIS — R509 Fever, unspecified: Secondary | ICD-10-CM

## 2012-06-06 LAB — BASIC METABOLIC PANEL
CO2: 22 mEq/L (ref 19–32)
CO2: 22 mEq/L (ref 19–32)
Calcium: 8.6 mg/dL (ref 8.4–10.5)
Chloride: 96 mEq/L (ref 96–112)
Creatinine, Ser: 1.99 mg/dL — ABNORMAL HIGH (ref 0.50–1.35)
GFR calc Af Amer: 38 mL/min — ABNORMAL LOW (ref >90)
Glucose, Bld: 243 mg/dL — ABNORMAL HIGH (ref 70–99)
Potassium: 4 mEq/L (ref 3.5–5.1)
Sodium: 129 mEq/L — ABNORMAL LOW (ref 135–145)

## 2012-06-06 LAB — HEMOGLOBIN A1C
Hgb A1c MFr Bld: 7.1 % — ABNORMAL HIGH (ref <5.7)
Mean Plasma Glucose: 157 mg/dL — ABNORMAL HIGH (ref <117)

## 2012-06-06 LAB — CBC
HCT: 29.2 % — ABNORMAL LOW (ref 39.0–52.0)
Hemoglobin: 9.8 g/dL — ABNORMAL LOW (ref 13.0–17.0)
MCHC: 33.6 g/dL (ref 30.0–36.0)
RBC: 3.29 MIL/uL — ABNORMAL LOW (ref 4.22–5.81)
WBC: 3.6 10*3/uL — ABNORMAL LOW (ref 4.0–10.5)

## 2012-06-06 LAB — MRSA PCR SCREENING: MRSA by PCR: NEGATIVE

## 2012-06-06 LAB — SODIUM, URINE, RANDOM: Sodium, Ur: 28 mEq/L

## 2012-06-06 LAB — CREATININE, URINE, RANDOM: Creatinine, Urine: 261.11 mg/dL

## 2012-06-06 MED ORDER — COLCHICINE 0.6 MG PO TABS
0.6000 mg | ORAL_TABLET | Freq: Every day | ORAL | Status: DC
  Administered 2012-06-06 – 2012-06-08 (×3): 0.6 mg via ORAL
  Filled 2012-06-06 (×5): qty 1

## 2012-06-06 MED ORDER — ONDANSETRON HCL 4 MG PO TABS: 4.0000 mg | ORAL_TABLET | Freq: Four times a day (QID) | ORAL | Status: DC | PRN

## 2012-06-06 MED ORDER — CYANOCOBALAMIN 1000 MCG/ML IJ SOLN
1000.0000 ug | INTRAMUSCULAR | Status: DC
  Administered 2012-06-06 – 2012-06-13 (×2): 1000 ug via INTRAMUSCULAR
  Filled 2012-06-06 (×2): qty 1

## 2012-06-06 MED ORDER — HEPARIN SODIUM (PORCINE) 5000 UNIT/ML IJ SOLN
5000.0000 [IU] | Freq: Three times a day (TID) | INTRAMUSCULAR | Status: DC
  Administered 2012-06-06 – 2012-06-09 (×9): 5000 [IU] via SUBCUTANEOUS
  Filled 2012-06-06 (×15): qty 1

## 2012-06-06 MED ORDER — ONDANSETRON HCL 4 MG/2ML IJ SOLN: 4.0000 mg | Freq: Four times a day (QID) | INTRAMUSCULAR | Status: DC | PRN

## 2012-06-06 MED ORDER — ACETAMINOPHEN 650 MG RE SUPP: 650.0000 mg | Freq: Four times a day (QID) | RECTAL | Status: DC | PRN

## 2012-06-06 MED ORDER — SODIUM CHLORIDE 0.9 % IV SOLN
INTRAVENOUS | Status: DC
  Administered 2012-06-06 – 2012-06-10 (×10): via INTRAVENOUS

## 2012-06-06 MED ORDER — FERROUS SULFATE 325 (65 FE) MG PO TABS
325.0000 mg | ORAL_TABLET | Freq: Three times a day (TID) | ORAL | Status: DC
  Administered 2012-06-06 – 2012-06-15 (×29): 325 mg via ORAL
  Filled 2012-06-06 (×34): qty 1

## 2012-06-06 MED ORDER — ACETAMINOPHEN 325 MG PO TABS
650.0000 mg | ORAL_TABLET | Freq: Four times a day (QID) | ORAL | Status: DC | PRN
  Administered 2012-06-06 – 2012-06-11 (×12): 650 mg via ORAL
  Filled 2012-06-06 (×15): qty 2

## 2012-06-06 MED ORDER — LABETALOL HCL 200 MG PO TABS
200.0000 mg | ORAL_TABLET | Freq: Two times a day (BID) | ORAL | Status: DC
  Filled 2012-06-06: qty 1

## 2012-06-06 NOTE — ED Notes (Signed)
Iv started and 1000cc nss added to iv at 159ml/hr

## 2012-06-06 NOTE — H&P (Signed)
Internal Medicine teaching Service Attending Dr.Kinzey Sheriff. I have personally examined the patient and reviewed the h and P documented by the Resident. In brief  Chief complaint: Painfull lymph nodes felling drained HOPI:Patrick [name] with HIV renal insufficiency HTN comes in with recurrent episodes of fever with cytopenias and lymphadenopathy. 9 point review of system as documented in the Resident note. Social history admitting medication family history past surgical history allergies reviewed. Physical examination Notable for: splenomegaly pain full lymphadenopathy, proteinuria and small ketonuria Labs are significant for : hyponatremia pancytopenia acute renal injury Imaging is significant for: hilar fullness bilaterally. EKG: not done A and P: Fever and Lymphadenopathy: extensive work up in the past with no definitive answer. ANA negative in 2012. mediastinal lymphadenopathy and splenomegaly present since 2013. Negative work up for malignancy. Unlikely to be kikuchis owing to no necrotising center on LN biopsy. Could be HIV associated. Acute on CKD : Probable AKI improving with hydration. FeNa of <2 IVF  Acute Pancytopenia : monitor closely  HIV/HAART : need to renally dose his medicaitons. Hyponatremia: sec to dehydration and increased glucose. DM ; new diagnosis will need to start on lower doses of Insulin since he is insulin naive. HTN: per patient his Medications were stopped secondary to low BP in past. Rest as per REsident documentation.

## 2012-06-06 NOTE — H&P (Signed)
Hospital Admission Note Date: 06/06/2012  Patient name: Patrick [name] Medical record number: [id number] Date of birth: [birth year] Age: 51 y.o. Gender: male PCP: Janalyn Harder, MD  Medical Service: Internal Medicine Teaching Service  Attending physician:  Dr. Lonzo Cloud    Internal Medicine Teaching Service Contact Information  1st Contact: Dow Adolph, MD  Pager:[phone] 2nd Contact:  Dede Query, MD              Pager:[phone]  After 5 pm or weekends: 1st Contact: Pager: [phone] 2nd Contact: Pager: [phone]   Chief Complaint: Lymph node enlargement  History of Present Illness:  Patrick [name] is a 51 year old male with a history of HIV (most recent CD4 count 370 on 05/05/12, VL undetectable), renal insufficiency with baseline creatinine~1.5, hypertension, recurrent lymphadenopathy and fever (since February 2012) thought to be benign reactive changes related to HIV. He presents to the ED with fever and lymphadenopathy since Tuesday. He states that he was working outside on Tuesday and thought he had "heat stroke"and has since had intermittent fevers, chills, sweats, worsening fatigue, and enlarging lymphadenopathy. This will be his seventh episode since February 2012. He is also complaining of abdominal pain and believes his spleen may be enlarged. These episodes typically last up to 2 weeks, are associated with rise in creatinine and improve without any specific treatment. He has had 2 axillary lymph node biopsies as well as a recent bone marrow biopsy that were negative for malignancy and infection. Pathology showed reactive polyclonal plasmacytosis.   He states that he has been compliant with Atripla therapy, had a recent good report from his ID physician Dr. Orvan Falconer.   Review of systems positive for headache, nausea, decreased appetite, dark yellow urine, shortness of breath with exertion. He states that these symptoms often accompany these episodes. He has also had 2 days of dry cough, and  several months of worsening numbness and tingling in his bilateral toes.  He denies chest pain, vomiting, rash, melena, BRBPR, flank pain, diarrhea, constipation, hair loss, dysuria, hematuria, penile discharge.No easy bruising, no petechial rashes.   Meds: Current Outpatient Rx  Name Route Sig Dispense Refill  . CYANOCOBALAMIN 1000 MCG/ML IJ SOLN  1,000 mcg every 7 (seven) days. Takes on Monday    . DAPSONE 100 MG PO TABS Oral Take 100 mg by mouth every morning.    Marland Kitchen EFAVIRENZ-EMTRICITAB-TENOFOVIR 600-200-300 MG PO TABS Oral Take 1 tablet by mouth at bedtime. 30 tablet 5  . FERROUS SULFATE 325 (65 FE) MG PO TABS Oral Take 1 tablet (325 mg total) by mouth 3 (three) times daily with meals. 90 tablet 5  . LABETALOL HCL 200 MG PO TABS Oral Take 200 mg by mouth 2 (two) times daily.      Allergies: Allergies as of 06/05/2012 - Review Complete 06/05/2012  Allergen Reaction Noted  . Codeine Itching, Rash, and Other (See Comments)   . Sulfonamide derivatives Rash 01/01/2009   Past Medical History  Diagnosis Date  . Hypertension   . HIV (human immunodeficiency virus infection) 2010  . Hypogonadism male   . CKD (chronic kidney disease) stage 3, GFR 30-59 ml/min     hydrated cr 1.5 gfr 48  . Kidney stones     "quite a few times"  . High cholesterol   . Heart murmur   . Exertional dyspnea 02/25/12    "and lying down"  . Anemia   . Blood transfusion   . Hernia     "surgical; where appendix ruptured"  . Pancytopenia  with fever 02/26/2012  . LYMPHADENOPATHY, DIFFUSE 11/18/2010    Qualifier: Diagnosis of  By: Sundra Aland NP, Malvin Johns      Past Surgical History  Procedure Date  . Lithotripsy 2010  . Appendectomy 2001    via midline incision  . Axillary lymph node dissection 2011; 2012    left   Family History  Problem Relation Age of Onset  . Adopted: Yes   History   Social History  . Marital Status: Single    Spouse Name: N/A    Number of Children: N/A  . Years of Education:  N/A   Occupational History  . Not on file.   Social History Main Topics  . Smoking status: Never Smoker   . Smokeless tobacco: Never Used  . Alcohol Use: 0.0 oz/week     02/25/12 "couple mixed drinks or beers or glasses of wine/month"  . Drug Use: No  . Sexually Active: Not Currently -- Male partner(s)    Birth Control/ Protection: Condom     refused condoms   Other Topics Concern  . Not on file   Social History Narrative  . No narrative on file    Review of Systems: Pertinent items are noted in HPI.  Physical Exam Blood pressure 108/57, pulse 95, temperature 100.1 F (37.8 C), temperature source Oral, resp. rate 20, SpO2 97.00%. General:  No acute distress, alert and oriented x 3, well-appearing  HEENT:  PERRL, EOMI, moist mucous membranes, no erythema or exudate Cardiovascular:  Rate 90, normal s1, s2, no murmurs, rubs or gallops Respiratory:  Good air movement, slight crackles on L base, R was clear, no wheezes, rales, or rhonchi Abdomen:  Soft, obese, slightly distended, mild diffuse tenderness, normoactive bowel sounds, palpable spleen tip noted 3-4 cm below costal margin. Well-healed midline scar noted. Extremities:  Warm and well-perfused, no clubbing, cyanosis, or edema. 2+ pedal pulses. Hyperpigmented lesion noted on L medial ankle Lymph Nodes: Mildly tender b/l anterior cervical, L supraclavicular lymphadenopathy; b/l axillary chain, inguinal lymphadenopathy Skin: Warm, moist, no rashes Neuro: Not anxious appearing, no depressed mood, normal affect  Lab results: Basic Metabolic Panel:  [facility] 06/05/12 2219 06/05/12 2103  NA 127* 130*  K 4.1 4.5  CL 94* 96  CO2 23 22  GLUCOSE 367* 278*  BUN 33* 32*  CREATININE 2.59* 2.35*  CALCIUM 8.7 9.1  MG -- --  PHOS -- --   Liver Function Tests:  [facility] 06/05/12 2103  AST 14  ALT 13  ALKPHOS 61  BILITOT 0.4  PROT 7.8  ALBUMIN 2.8*   No results found for this basename: LIPASE:2,AMYLASE:2 in the last 72  hours No results found for this basename: AMMONIA:2 in the last 72 hours CBC:  Basename 06/05/12 2219 06/05/12 2103  WBC 3.8* 4.0  NEUTROABS 1.9 1.9  HGB 9.5* 10.3*  HCT 28.5* 30.8*  MCV 88.8 88.8  PLT 93* 88*   Urinalysis:  Basename 06/05/12 2256  COLORURINE AMBER*  LABSPEC 1.029  PHURINE 5.0  GLUCOSEU NEGATIVE  HGBUR SMALL*  BILIRUBINUR MODERATE*  KETONESUR 15*  PROTEINUR 100*  UROBILINOGEN 0.2  NITRITE NEGATIVE  LEUKOCYTESUR NEGATIVE    Imaging results:  Dg Chest 2 View  06/05/2012  *RADIOLOGY REPORT*  Clinical Data: Fever.  Cough.  Swollen lymph nodes.  Chest pain  CHEST - 2 VIEW  Comparison: 02/22/2012.  Findings: Slightly shallow inspiration. The heart size and pulmonary vascularity are normal. The lungs appear clear and expanded without focal air space disease or consolidation. No blunting of  the costophrenic angles.  No pneumothorax.  Mediastinal contours appear intact.  No significant changes since previous study.  Surgical clips in the left axilla.  IMPRESSION: No evidence of active pulmonary disease.   Original Report Authenticated By: Marlon Pel, M.D.     Assessment & Plan by Problem: Principal Problem:  *Acute-on-chronic kidney injury Active Problems:  HIV DISEASE  HYPERTENSION  NEPHROLITHIASIS  LYMPHADENOPATHY, DIFFUSE  Pancytopenia with fever  Hyponatremia  Diabetes, Type II   1) Fever and lymphadenopathy: Recurrent episodes with similar presentation. Likely seventh episode in last 1.5 years- with possible 5th admission. 2 axillary lymph node biopsy and bone marrow biopsy unrevealing for malignancy or infections.  left axillary node bx 11/24/11: Benign lymph node tissue with regressively transformed germinal centers, prominent vascularity and extensive plasmacytosis, architecture generally preserved. Bone marrow Bx 02/24/12: Mild absolute neutropenia, increased bands and toxic granulation. Moderate normocytic, normochromic anemia, no schistocytes,  moderate thrombocytopenia. Patient with chronic splenomegaly.   Likely HIV related lymphadenopathy.   Has been followed by oncologist Dr. Gaylyn Rong while in hospital and in the clinic. Recently been seen by rheumatologist- Dr. Nickola Major- and has a followup appointment with her on coming Friday.   So for all admissions and workup has been unrevealing- no known cause of lymphadenopathy.      - Admit to regular floor.   - Blood cultures x2 collected in ER.   - Tylenol when necessary for fever and pain      2) Acute on chronic kidney disease : Baseline creatinine about 1.4-1.6.   Creatinine 2.59<1.2<1.63<1.59.   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 Also had pyelonephritis in past.   Patient does not seem to have UTI at present. Has granular casts in urine though.   Patient hydrating himself at home but still c/o dark yellow urine.   Unclear about the cause- but reflective of recent acute process ( as patient has worsening renal function with fever and lymphadenopathy in past).      -Will hydrate at 100cc/h tonight and hold Atripla. Re-evaluate in morning.   -monitor urine outpt   - Will check FENa.   - Consider renal consult and renal ultrasound in a.m. if creatinine does not improve.         3) Acute Pancytopenia - hemoglobin 9.5<12.1, WBC 3.8< 6.4 , platelets 93 < 236 .   This has been an issue during previous admissions.   History not suggestive of GI or other source of blood loss.    Likely related to acute process. Been followed by hematologist. Bone marrow biopsy and lymph node biopsies unrevealing as in #1.   Last ferritin level 685 in May 2013.       Plan   -recheck CBC in am       4) HIV- compliant with Atripla. Dapsone recently stopped by Dr. Orvan Falconer. Recent CD4 count 370< 210<110.      Plan   -Will hold his atripla for now until rehydrated.   - If kidney function does not improve by tomorrow, will need to renally dose  individual drugs of Atripla.      5) Hyponatremia: has been in low 130s over past few months, further decrease likely secondary to some volume depletion and hyperglycemia. Systolic blood pressure on lower side.   Sodium 127 - corrected sodium - 131-2   Plan   - Check orthostatics   - Hydrate with normal saline overnight.   - check BMET in a.m.  6) HTN- systolic blood pressure on lower side.      Plan   - Hold Labetolol- restart as tolerated.       7) DM 2 : Hyperglycemia on admission-368 in setting of acute process   Last A1c 6.6 in May 2013. Not on any medications.   We'll repeat A1c.   - If worse, might need to start on meds.            8) DVT Px- Heparin.    9) Diet: regular        Signed: Denton Ar 06/06/2012, 12:16 AM

## 2012-06-06 NOTE — Progress Notes (Signed)
Subjective:    Interval Events:   He had a fair night. However, he still complains of feeling a lot of heat, and some sweating. No overnight events. His appetite is still poor.     Objective:    Vital Signs:   Temp:  [98.2 F (36.8 C)-101.9 F (38.8 C)] 98.3 F (36.8 C) (09/09 0920) Pulse Rate:  [85-113] 85  (09/09 0920) Resp:  [16-20] 18  (09/09 0920) BP: (95-132)/(52-75) 100/55 mmHg (09/09 0920) SpO2:  [96 %-100 %] 100 % (09/09 0920) Weight:  [232 lb 3.2 oz (105.325 kg)] 232 lb 3.2 oz (105.325 kg) (09/09 0047)     Weights: 24-hour Weight change:   Filed Weights   06/06/12 0047  Weight: 232 lb 3.2 oz (105.325 kg)     Intake/Output:   Intake/Output Summary (Last 24 hours) at 06/06/12 1058 Last data filed at 06/06/12 0900  Gross per 24 hour  Intake    240 ml  Output      0 ml  Net    240 ml       Physical Exam: General appearance: alert, cooperative, no distress and mildly obese Head: Normocephalic, without obvious abnormality, atraumatic Neck: mild anterior cervical adenopathy, no carotid bruit, no JVD, supple, symmetrical, trachea midline and thyroid not enlarged, symmetric, no tenderness/mass/nodules Resp: clear to auscultation bilaterally Cardio: regular rate and rhythm, S1, S2 normal, no murmur, click, rub or gallop GI: soft, non-tender; bowel sounds normal; no masses,  no organomegaly Extremities: extremities normal, atraumatic, no cyanosis or edema Pulses: 2+ and symmetric Lymph nodes: Cervical adenopathy: bilaterally but small in size measuring around 2-3cm and discrete, Axillary adenopathy: mainly in the left axilla and Inguinal adenopathy: I can not appreciate any - he say they were there a couple of weeks ago Neurologic: Alert and oriented X 3, normal strength and tone. Normal symmetric reflexes. Normal coordination and gait    Labs: Basic Metabolic Panel:  Lab 06/06/12 7829 06/05/12 2219 06/05/12 2103  NA 129* 127* 130*  K 4.0 4.1 4.5  CL  96 94* 96  CO2 22 23 22   GLUCOSE 273* 367* 278*  BUN 33* 33* 32*  CREATININE 2.23* 2.59* 2.35*  CALCIUM 8.7 8.7 9.1  MG -- -- --  PHOS -- -- --    Liver Function Tests:  Lab 06/05/12 2103  AST 14  ALT 13  ALKPHOS 61  BILITOT 0.4  PROT 7.8  ALBUMIN 2.8*   No results found for this basename: LIPASE:5,AMYLASE:5 in the last 168 hours No results found for this basename: AMMONIA:3 in the last 168 hours  CBC:  Lab 06/06/12 0640 06/05/12 2219 06/05/12 2103  WBC 3.6* 3.8* 4.0  NEUTROABS -- 1.9 1.9  HGB 9.8* 9.5* 10.3*  HCT 29.2* 28.5* 30.8*  MCV 88.8 88.8 88.8  PLT 88* 93* 88*    Cardiac Enzymes: No results found for this basename: CKTOTAL:5,CKMB:5,CKMBINDEX:5,TROPONINI:5 in the last 168 hours  BNP: No components found with this basename: POCBNP:5  CBG: No results found for this basename: GLUCAP:5 in the last 168 hours  Coagulation Studies: No results found for this basename: LABPROT:5,INR:5 in the last 72 hours  Microbiology: Results for orders placed during the hospital encounter of 06/05/12  MRSA PCR SCREENING     Status: Normal   Collection Time   06/06/12  1:05 AM      Component Value Range Status Comment   MRSA by PCR NEGATIVE  NEGATIVE Final      Imaging: Dg Chest 2 View  06/05/2012  *RADIOLOGY REPORT*  Clinical Data: Fever.  Cough.  Swollen lymph nodes.  Chest pain  CHEST - 2 VIEW  Comparison: 02/22/2012.  Findings: Slightly shallow inspiration. The heart size and pulmonary vascularity are normal. The lungs appear clear and expanded without focal air space disease or consolidation. No blunting of the costophrenic angles.  No pneumothorax.  Mediastinal contours appear intact.  No significant changes since previous study.  Surgical clips in the left axilla.  IMPRESSION: No evidence of active pulmonary disease.   Original Report Authenticated By: Marlon Pel, M.D.       Medications:    Infusions:    . sodium chloride 100 mL/hr at 06/06/12 0933      Scheduled Medications:    . acetaminophen  650 mg Oral Once  . cyanocobalamin  1,000 mcg Intramuscular Q7 days  . ferrous sulfate  325 mg Oral TID WC  . heparin  5,000 Units Subcutaneous Q8H  . DISCONTD: labetalol  200 mg Oral BID     PRN Medications: acetaminophen, acetaminophen, ondansetron (ZOFRAN) IV, ondansetron   Assessment/ Plan:   Patrick [name] is a 51 year old male with a history of HIV (most recent CD4 count 370 on 05/05/12, VL undetectable), renal insufficiency with baseline creatinine~1.5, hypertension, recurrent lymphadenopathy and fever (since February 2012) thought to be benign reactive changes related to HIV. He presents to the ED with fever and lymphadenopathy since Tuesday. He has been admitted with similar symptoms in the past.  Fever and lymphadenopathy: Recurrent episodes with similar presentation. Likely seventh episode in last 1.5 years- with possible 5th admission. 2 axillary lymph node biopsy and bone marrow biopsy unrevealing for malignancy or infections. left axillary node bx 11/24/11: Benign lymph node tissue with regressively transformed germinal centers, prominent vascularity and extensive plasmacytosis, architecture generally preserved. Bone marrow Bx 02/24/12: Mild absolute neutropenia, increased bands and toxic granulation. Moderate normocytic, normochromic anemia, no schistocytes, moderate thrombocytopenia. Patient with chronic splenomegaly. Likely HIV related lymphadenopathy.  Has been followed by oncologist Dr. Gaylyn Rong while in hospital and in the clinic. Autoimmune cause has been considered and ANA - negative. He was recently been seen by rheumatologist- Dr. Nickola Major- and has a followup appointment with her on coming Friday. So for all admissions and workup has been unrevealing- no known cause of lymphadenopathy and the general feeling is that these symptoms are related to HIV as opposed to cancer like leukemia or MDS. His bone marrow's cytogenetics were  negative. Plan - Follow up blood cultures x2 collected in ER.  - Tylenol when necessary for fever and pain - We will continue to manage him conservatively with Tylenol for fever IV fluids but will not do more work up for the lymphadenopathy since a lot has been done already with previous inpatient and outpatient follow up.   Acute on chronic kidney disease : Baseline creatinine about 1.4-1.6.  Creatinine 2.59<1.2<1.63<1.59. 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 Also had pyelonephritis in past. Patient does not seem to have UTI at present. Has granular casts in urine though. Patient hydrating himself at home with Gatorade but still c/o dark yellow urine. Unclear about the cause- but reflective of recent acute process ( as patient has worsening renal function with fever and lymphadenopathy in past).   Plan -Will hydrate at 100cc/h tonight and hold Atripla. Re-evaluate in morning.  -monitor urine outpt  - FENa is 0.2 (<1%) which suggest prerenal cause of his acute renal insufficiency. This is likely from dehydration  as he reports doing a lot of yard work on Tuesday which is which is when the symptoms started. The Cr is improving from 2.59 yesterday to 2.23 this morning. - We will continue to rehydrate with Normal saline at 100cc/hr and repeat check for BMET and it the results do not improve, will consult nephrology. - I will notify ID about this AKI together with Atripla which has Tenofivir.  - For now, we will continue withholding Atripla.   Acute Pancytopenia - hemoglobin 9.5<12.1, WBC 3.6<3.8< 6.4 , platelets 88<93 < 236 .  This has been an issue during previous admissions. History not suggestive of GI or other source of blood loss.  Likely related to acute process. Been followed by hematologist. Bone marrow biopsy and lymph node biopsies unrevealing as in #1. Last ferritin level 685 in May 2013. In June 2013, cultures from bone marrow  aspirate was still negative for AFB, fungal, viral. His bone marrow's cytogenetics were negative. Therefore his low blood counts and fever were thought to be most likely reactive. There was no concern for primary bone marrow disease such as leukemia, lymphoma, MDS. Dr Jethro Bolus (oncologist) recommended that he follows up with his primary doctor and HIV  He was seen by Dr Nickola Major, a rheumatologist who orders several tests including ESR 55, C-reactive protein 8.5, HLA-B27 negative, CCP antibodies, IgG - negative, rheumatoid factor less than 13, ANA negative, C3 complement 147, C4 compliment 27, uric acid 7.0, and ANCA panel -all Negative. TSH 3.85, testosterone 2.77. X-ray of hand to rule osteoarthritis - normal. He has a followup appointment on 06/10/2012. Plan: We will recheck cbc again tomorrow.  Mr Tally has had similar pancytopenia with his last admissions and test to purse cause has not revealed any cause other than possible HIV related.   HIV- compliant with Atripla. Dapsone recently stopped by Dr. Orvan Falconer. Recent CD4 count 370< 210<110. My only concern today is the issue to Atripla given Mr Caddock's changes in renal function and history of renal stones. I will discuss with ID about his HIV medications. Plan  -Will hold his atripla for now until rehydrated.  - If kidney function does not improve by tomorrow, will need to renally dose individual drugs of Atripla.   5) Hyponatremia: has been in low 130s over past few months, further decrease likely secondary to some volume depletion and hyperglycemia. Systolic blood pressure on lower side. He was not orthostatic. I suspect dehydration.  Sodium 127 - corrected sodium - 131-2  Plan  - continue with hydration with normal saline  - check BMET later today   HTN- systolic blood pressure on lower side.  Plan  - Hold Labetolol- restart as tolerated.   DM 2 : Hyperglycemia on admission-368 in setting of acute process  Last A1c 6.6 in May 2013. He  meets the diagnostic criteria for Diabetes. I have discussed with him concerning treatment and life style changes for diabetes and he is very motivated to try losing weight and improve diet. Not on any medications. He does not want to start medication at this time.  Repeat A1c pending.   8) DVT Px- Heparin.       Length of Stay: 1 days   Signed by:  Dow Adolph PGY-I, Internal Medicine Pager [phone] 06/06/2012, 10:58 AM

## 2012-06-06 NOTE — Progress Notes (Signed)
Inpatient Diabetes Program Recommendations  AACE/ADA: New Consensus Statement on Inpatient Glycemic Control  Target Ranges:  Prepandial:   less than 140 mg/dL      Peak postprandial:   less than 180 mg/dL (1-2 hours)      Critically ill patients:  140 - 180 mg/dL  Pager:  [phone] Hours:  8 am-10pm   Reason for Visit: ? New onset Diabetes  Inpatient Diabetes Program Recommendations Insulin - Basal: Add basal insulin: 15 units daily.  Titrate as needed Correction (SSI): Add Novolog Correction Oral Agents: Patient will most lilkely require oral agents at discharge based on previous A1C of 6.6% and recent lab glucose.  New HgbA1C pending- Please address with patient and order education as needed. Diet: Add CHO modified medium to diet  Alfredia Client PhD, RN, BC-ADM Diabetes Coordinator  Office:  [phone] Team Pager:  [phone]

## 2012-06-07 ENCOUNTER — Inpatient Hospital Stay (HOSPITAL_COMMUNITY): Payer: Medicaid Other

## 2012-06-07 DIAGNOSIS — N170 Acute kidney failure with tubular necrosis: Principal | ICD-10-CM

## 2012-06-07 DIAGNOSIS — N200 Calculus of kidney: Secondary | ICD-10-CM

## 2012-06-07 DIAGNOSIS — E119 Type 2 diabetes mellitus without complications: Secondary | ICD-10-CM

## 2012-06-07 DIAGNOSIS — I10 Essential (primary) hypertension: Secondary | ICD-10-CM

## 2012-06-07 LAB — HIV-1 RNA QUANT-NO REFLEX-BLD: HIV-1 RNA Quant, Log: <1.3 {Log} (ref <1.30)

## 2012-06-07 LAB — T-HELPER CELLS (CD4) COUNT (NOT AT ARMC)
CD4 % Helper T Cell: 17 % — ABNORMAL LOW (ref 33–55)
CD4 T Cell Abs: 180 uL — ABNORMAL LOW (ref 400–2700)

## 2012-06-07 MED ORDER — LIVING WELL WITH DIABETES BOOK
Freq: Once | Status: AC
  Administered 2012-06-07: 17:00:00
  Filled 2012-06-07: qty 1

## 2012-06-07 NOTE — Plan of Care (Signed)
Problem: Food- and Nutrition-Related Knowledge Deficit (NB-1.1) Goal: Nutrition education Formal process to instruct or train a patient/client in a skill or to impart knowledge to help patients/clients voluntarily manage or modify food choices and eating behavior to maintain or improve health.  Outcome: Completed/Met Date Met:  06/07/12 RD consulted for nutrition education regarding diabetes.     Lab Results  Component Value Date    HGBA1C 7.1* 06/06/2012    RD provided "Carbohydrate Counting for People with Diabetes" handout from the Academy of [employer] and Dietetics. Discussed different food groups and their effects on blood sugar, emphasizing carbohydrate-containing foods. Provided list of carbohydrates and recommended serving sizes of common foods. Discussed importance of controlled and consistent carbohydrate intake throughout the day. Provided examples of ways to balance meals/snacks and encouraged intake of high-fiber, whole grain complex carbohydrates.  Patient very interested in education and making lifestyle changes to avoid going on medications. Pt eats fairly healthy, however he has problems with snacking and being sedentary. Pt asking for meal plans, provided pt with several days worth of meal plans.  Expect good compliance.  BMI is 32.4. Pt meets criteria for Obese Class I based on current BMI.  Current diet order is Regular, patient is consuming approximately 80% of meals at this time. Labs and medications reviewed. No further nutrition interventions warranted at this time. RD contact information provided. If additional nutrition issues arise, please re-consult RD.  Jarold Motto MS, RD, LDN Pager: [phone] After-hours pager: [phone]

## 2012-06-07 NOTE — Consult Note (Signed)
[facility] Consult Note [facility]  Date: 06/07/2012  Patient name: Patrick [name] Medical record number: [id number] Date of birth: August 14, 1961 Age: 51 y.o. Gender: male PCP: Patrick Harder, MD  Medical Service: Family Medicine  Conulting physician: Dr. Lonzo Cloud      Chief Complaint: Enlarged lymph nodes & fever  History of Present Illness:  Pt is a 51 yo caucasian male with a history of CKD due to obstruction from nephrolithiasis in 2010, HTN, HIV, and gout.  He was admitted with fevers, fatigue and lymphadenopathy which has been happening intermittently since Feb 2012.  He reports this started last Tuesday after working in his yard in the heat.  He reports feeling fatigued all summer and has not participated in his usual social activities since May 2013 due to fatigue.  He has been taking his antiretroviral therapy (Atripla) regularly at home and his CD4 count was 370 on Aug 8th.  He reports enlarged cervical and axillary lymph nodes for the past week and some left sided abdominal fullness which is somewhat uncomfortable.  Pt has a hx of nephrolithiasis with the last stone documented in May of this year.  Pt reports that his abdominal discomfort does not feel like his previous kidney stones.  Patrick [name] reports hypotension over the past week with some lightheadedness upon standing.  He thought that this was unusual since his BP is usually high.  He reports normal urine output over the past week but says that his urine was a darker color than usual this week.  He has also noticed having an altered sense of taste and smell since last Tuesday.  Pt has had 2 axillary lymph node biopsies and a bone marrow biopsy which were negative for malignancy and infection.  Since admission Atripla has been stopped due to concern about acute renal failure.    Meds: Prior to Admission medications   Medication Sig Start Date End Date Taking? Authorizing Provider  cyanocobalamin  (,VITAMIN B-12,) 1000 MCG/ML injection 1,000 mcg every 7 (seven) days. Takes on Monday 12/15/11  Yes Danley Danker, MD  dapsone 100 MG tablet Take 100 mg by mouth every morning.   Yes Historical Provider, MD  efavirenz-emtrictabine-tenofovir (ATRIPLA) 600-200-300 MG per tablet Take 1 tablet by mouth at bedtime. 03/07/12 03/07/13 Yes Daryel Gerald, MD  ferrous sulfate 325 (65 FE) MG tablet Take 1 tablet (325 mg total) by mouth 3 (three) times daily with meals. 02/26/12 02/25/13 Yes Daryel Gerald, MD  labetalol (NORMODYNE) 200 MG tablet Take 200 mg by mouth 2 (two) times daily.   Yes Historical Provider, MD   Current facility-administered medications:0.9 %  sodium chloride infusion, , Intravenous, Continuous, Dow Adolph, MD, Last Rate: 125 mL/hr at 06/07/12 1010;  acetaminophen (TYLENOL) suppository 650 mg, 650 mg, Rectal, Q6H PRN, Sunday Spillers, MD;  acetaminophen (TYLENOL) tablet 650 mg, 650 mg, Oral, Q6H PRN, Sunday Spillers, MD, 650 mg at 06/07/12 7829;  colchicine tablet 0.6 mg, 0.6 mg, Oral, Daily, Na Li, MD, 0.6 mg at 06/07/12 1001 cyanocobalamin ((VITAMIN B-12)) injection 1,000 mcg, 1,000 mcg, Intramuscular, Q7 days, Sunday Spillers, MD, 1,000 mcg at 06/06/12 0931;  ferrous sulfate tablet 325 mg, 325 mg, Oral, TID WC, Sunday Spillers, MD, 325 mg at 06/07/12 1204;  heparin injection 5,000 Units, 5,000 Units, Subcutaneous, Q8H, Sunday Spillers, MD, 5,000 Units at 06/07/12 0536;  ondansetron (ZOFRAN) injection 4 mg, 4 mg, Intravenous, Q6H PRN, Sunday Spillers, MD ondansetron [facility]) tablet  4 mg, 4 mg, Oral, Q6H PRN, Sunday Spillers, MD Allergies: Codeine and Sulfonamide derivatives Past Medical History  Diagnosis Date  . Hypertension   . HIV (human immunodeficiency virus infection) 2010  . Hypogonadism male   . CKD (chronic kidney disease) stage 3, GFR 30-59 ml/min     hydrated cr 1.5 gfr 48  . Kidney stones     "quite a few times"  . High cholesterol   . Heart murmur   .  Exertional dyspnea 02/25/12    "and lying down"  . Anemia   . Blood transfusion   . Hernia     "surgical; where appendix ruptured"  . Pancytopenia with fever 02/26/2012  . LYMPHADENOPATHY, DIFFUSE 11/18/2010    Qualifier: Diagnosis of  By: Sundra Aland NP, Malvin Johns      Past Surgical History  Procedure Date  . Lithotripsy 2010  . Appendectomy 2001    via midline incision  . Axillary lymph node dissection 2011; 2012    left   Family History  Problem Relation Age of Onset  . Adopted: Yes   History   Social History  . Marital Status: Single    Spouse Name: N/A    Number of Children: N/A  . Years of Education: N/A   Occupational History  . Not on file.   Social History Main Topics  . Smoking status: Never Smoker   . Smokeless tobacco: Never Used  . Alcohol Use: 0.0 oz/week     02/25/12 "couple mixed drinks or beers or glasses of wine/month"  . Drug Use: No  . Sexually Active: Not Currently -- Male partner(s)    Birth Control/ Protection: Condom     refused condoms   Other Topics Concern  . Not on file   Social History Narrative  . No narrative on file    Review of Systems: Constitutional: Pt denies recent weight loss but has had fevers, fatigue, and lymphadenopathy. Neck: pt reports enlarged & tender cervical lymph nodes Respiratory: Pt denies dyspnea and chest congestion.  Cardio: Pt reports some dyspnea with exertion over the past week GI: Nausea without vomiting.  Pt reports that his stools are normally loose but that the iron supplement helps them to be more formed.   Hematologic/Lymphatic: Pt reports a hx of anemia and fatigue, he gets B12 injections monthly. Neuro: Pt reports altered sense of taste and smell over the past week.  Denies vision changes    Physical Exam: Blood pressure 123/78, pulse 95, temperature 99 F (37.2 C), temperature source Oral, resp. rate 18, weight 106.641 kg (235 lb 1.6 oz), SpO2 100.00%.  General: Pt is a well developed, well  nourished, overweight caucasian male laying on his side in bed in no acute distress.   Eyes, Ears, Nose Mouth, Throat: PERRL, Mucous membranes are moist. Neck: Supple, no JVD noted.  Pt has diffuse, tender cervical lymphadenopathy.   Respiratory: pt breathing easily without use of accessory muscles, lungs clear to auscultation bilaterally.   Cardio: Regular rate and rhythm, S1 & S2, noted. Abdomen: Soft, obese, abdomen distended more notably on the left side.  Left side tender to palpation.  Spleen enlarged and palpable below costal margin.  Active BS.  Hernia noted midline, right of umbilicus.   Extremities: 2+ pedal pulses, no lower extremity edema noted.  Neuro: Pt alert and oriented x3 Psych: Mood and affect appropriate  Lab results: Basic Metabolic Panel:  Lab 06/06/12 1610 06/06/12 0640 06/05/12 2219  NA 128* 129* 127*  K  4.3 4.0 4.1  CL 98 96 94*  CO2 22 22 23   GLUCOSE 243* 273* 367*  BUN 32* 33* 33*  CREATININE 1.99* 2.23* 2.59*  CALCIUM 8.6 8.7 8.7  ALB -- -- --  PHOS -- -- --   Liver Function Tests:  Lab 06/05/12 2103  AST 14  ALT 13  ALKPHOS 61  BILITOT 0.4  PROT 7.8  ALBUMIN 2.8*   CBC:  Lab 06/06/12 0640 06/05/12 2219 06/05/12 2103  WBC 3.6* 3.8* 4.0  NEUTROABS -- 1.9 1.9  HGB 9.8* 9.5* 10.3*  HCT 29.2* 28.5* 30.8*  MCV 88.8 88.8 88.8  PLT 88* 93* 88*   Blood Culture    Component Value Date/Time   SDES BLOOD LEFT ARM 06/05/2012 2220   SPECREQUEST BOTTLES DRAWN AEROBIC AND ANAEROBIC 10CC EACH 06/05/2012 2220   CULT        BLOOD CULTURE RECEIVED NO GROWTH TO DATE CULTURE WILL BE HELD FOR 5 DAYS BEFORE ISSUING A FINAL NEGATIVE REPORT 06/05/2012 2220   REPTSTATUS PENDING 06/05/2012 2220   Micro Results: Recent Results (from the past 240 hour(s))  CULTURE, BLOOD (ROUTINE X 2)     Status: Normal (Preliminary result)   Collection Time   06/05/12 10:10 PM      Component Value Range Status Comment   Specimen Description BLOOD RIGHT ARM   Final    Special Requests  BOTTLES DRAWN AEROBIC AND ANAEROBIC 10CC EACH   Final    Culture  Setup Time 06/06/2012 08:11   Final    Culture     Final    Value:        BLOOD CULTURE RECEIVED NO GROWTH TO DATE CULTURE WILL BE HELD FOR 5 DAYS BEFORE ISSUING A FINAL NEGATIVE REPORT   Report Status PENDING   Incomplete   CULTURE, BLOOD (ROUTINE X 2)     Status: Normal (Preliminary result)   Collection Time   06/05/12 10:20 PM      Component Value Range Status Comment   Specimen Description BLOOD LEFT ARM   Final    Special Requests BOTTLES DRAWN AEROBIC AND ANAEROBIC 10CC EACH   Final    Culture  Setup Time 06/06/2012 08:12   Final    Culture     Final    Value:        BLOOD CULTURE RECEIVED NO GROWTH TO DATE CULTURE WILL BE HELD FOR 5 DAYS BEFORE ISSUING A FINAL NEGATIVE REPORT   Report Status PENDING   Incomplete   MRSA PCR SCREENING     Status: Normal   Collection Time   06/06/12  1:05 AM      Component Value Range Status Comment   MRSA by PCR NEGATIVE  NEGATIVE Final    Studies/Results: Dg Chest 2 View  06/05/2012  *RADIOLOGY REPORT*  Clinical Data: Fever.  Cough.  Swollen lymph nodes.  Chest pain  CHEST - 2 VIEW  Comparison: 02/22/2012.  Findings: Slightly shallow inspiration. The heart size and pulmonary vascularity are normal. The lungs appear clear and expanded without focal air space disease or consolidation. No blunting of the costophrenic angles.  No pneumothorax.  Mediastinal contours appear intact.  No significant changes since previous study.  Surgical clips in the left axilla.  IMPRESSION: No evidence of active pulmonary disease.   Original Report Authenticated By: Marlon Pel, M.D.     Assessment & Plan by Problem:   1. Acute Renal Failure on Chronic Kidney Disease: Pt has a baseline Creatinine of 1.5  which is elevated to 1.99 but trending down today.  Pt reports good urine output.  UA revealed elevated protein (100 mg/dL), ketones (15), and bilirubin (moderate).  Pt is not uremic.  No need for dialysis  at this time.  Monitor improvement in Cr. Ordered serology to work up GN & vasculitis.  Recommend renal ultrasound to assess for nephrolithiasis.  2. Hyponatremia: Na+ has been low this week, 128 today.  Urine osmolality is 516 and urine sodium is 28.  BP has been a little low, labetalol being held.  3. Fever and Lymphadenopathy: Workup in process  Consider restarting antiretroviral therapy except for Tenofovir to prevent ARI.  4. DM II: New onset, pt received DM diet instruction today.   5. Pancytopenia: recurrent, pt has been worked up by hematology.  Not likely due to CKD.   This is a Psychologist, occupational Note.  The care of the patient was discussed with Dr. Arrie Aran and the assessment and plan was formulated with their assistance.  Please see their note for official documentation of the patient encounter.   Signed: Delmer Islam, PA-S2 06/07/2012, 3:07 PM

## 2012-06-07 NOTE — Consult Note (Addendum)
Spoke with patient regarding his latest HgbA1C results which are indicative of a diagnosis of diabetes.  However, his glucose levels over the past 2 weeks can greatly influence his present A1C even though it should be a 2-3 month average.  Also, I just noted his latest Hgb and Hct results which are low which can present a falsely high HgbA1C as well.  (I will mention this to hm tomorrow).Pt understands what he can do to control his glucose levels. He is eligible to go to OP education if he likes as well. He has meal plans from the RD today and feels he can eat accordingly and exercise once he feels better.  Will bring patient more teaching materials tomorrow and mention that his RBC count may be influencing his A1C results. Thank you, Lenor Coffin, RN, CNS, Diabetes Coordinator [phone])

## 2012-06-07 NOTE — Progress Notes (Signed)
Subjective:    Interval Events:  He is still spiking fevers. Had temp 102F x 2 last night.  No new complaints.    Objective:    Vital Signs:   Temp:  [97.9 F (36.6 C)-102.5 F (39.2 C)] 99 F (37.2 C) (09/10 1322) Pulse Rate:  [69-109] 95  (09/10 1322) Resp:  [18-20] 18  (09/10 1322) BP: (109-140)/(50-78) 123/78 mmHg (09/10 1322) SpO2:  [98 %-100 %] 100 % (09/10 1322) Weight:  [235 lb 1.6 oz (106.641 kg)] 235 lb 1.6 oz (106.641 kg) (09/09 2023) Last BM Date: 06/06/09   Weights: 24-hour Weight change: 2 lb 14.4 oz (1.315 kg)  Filed Weights   06/06/12 0047 06/06/12 2023  Weight: 232 lb 3.2 oz (105.325 kg) 235 lb 1.6 oz (106.641 kg)     Intake/Output:   Intake/Output Summary (Last 24 hours) at 06/07/12 1351 Last data filed at 06/07/12 1323  Gross per 24 hour  Intake   1265 ml  Output      0 ml  Net   1265 ml       Physical Exam: General appearance: alert, no distress and morbidly obese Head: Normocephalic, without obvious abnormality, atraumatic Cardio: regular rate and rhythm, S1, S2 normal, no murmur, click, rub or gallop GI: soft, non-tender; bowel sounds normal; no masses,  no organomegaly Extremities: extremities normal, atraumatic, no cyanosis or edema Lymph nodes: Cervical adenopathy: present bilaterally and Axillary adenopathy: present bilaterally  Neurologic: Grossly normal    Labs: Basic Metabolic Panel:  Lab 06/06/12 7829 06/06/12 0640 06/05/12 2219 06/05/12 2103  NA 128* 129* 127* 130*  K 4.3 4.0 4.1 4.5  CL 98 96 94* 96  CO2 22 22 23 22   GLUCOSE 243* 273* 367* 278*  BUN 32* 33* 33* 32*  CREATININE 1.99* 2.23* 2.59* 2.35*  CALCIUM 8.6 8.7 8.7 --  MG -- -- -- --  PHOS -- -- -- --    Liver Function Tests:  Lab 06/05/12 2103  AST 14  ALT 13  ALKPHOS 61  BILITOT 0.4  PROT 7.8  ALBUMIN 2.8*   No results found for this basename: LIPASE:5,AMYLASE:5 in the last 168 hours No results found for this basename: AMMONIA:3 in the last  168 hours  CBC:  Lab 06/06/12 0640 06/05/12 2219 06/05/12 2103  WBC 3.6* 3.8* 4.0  NEUTROABS -- 1.9 1.9  HGB 9.8* 9.5* 10.3*  HCT 29.2* 28.5* 30.8*  MCV 88.8 88.8 88.8  PLT 88* 93* 88*    Cardiac Enzymes: No results found for this basename: CKTOTAL:5,CKMB:5,CKMBINDEX:5,TROPONINI:5 in the last 168 hours  BNP: No components found with this basename: POCBNP:5  CBG: No results found for this basename: GLUCAP:5 in the last 168 hours  Coagulation Studies: No results found for this basename: LABPROT:5,INR:5 in the last 72 hours  Microbiology: Results for orders placed during the hospital encounter of 06/05/12  CULTURE, BLOOD (ROUTINE X 2)     Status: Normal (Preliminary result)   Collection Time   06/05/12 10:10 PM      Component Value Range Status Comment   Specimen Description BLOOD RIGHT ARM   Final    Special Requests BOTTLES DRAWN AEROBIC AND ANAEROBIC 10CC EACH   Final    Culture  Setup Time 06/06/2012 08:11   Final    Culture     Final    Value:        BLOOD CULTURE RECEIVED NO GROWTH TO DATE CULTURE WILL BE HELD FOR 5 DAYS BEFORE ISSUING A FINAL NEGATIVE  REPORT   Report Status PENDING   Incomplete   CULTURE, BLOOD (ROUTINE X 2)     Status: Normal (Preliminary result)   Collection Time   06/05/12 10:20 PM      Component Value Range Status Comment   Specimen Description BLOOD LEFT ARM   Final    Special Requests BOTTLES DRAWN AEROBIC AND ANAEROBIC 10CC EACH   Final    Culture  Setup Time 06/06/2012 08:12   Final    Culture     Final    Value:        BLOOD CULTURE RECEIVED NO GROWTH TO DATE CULTURE WILL BE HELD FOR 5 DAYS BEFORE ISSUING A FINAL NEGATIVE REPORT   Report Status PENDING   Incomplete   MRSA PCR SCREENING     Status: Normal   Collection Time   06/06/12  1:05 AM      Component Value Range Status Comment   MRSA by PCR NEGATIVE  NEGATIVE Final     Other results:   Imaging: Dg Chest 2 View  06/05/2012  *RADIOLOGY REPORT*  Clinical Data: Fever.  Cough.   Swollen lymph nodes.  Chest pain  CHEST - 2 VIEW  Comparison: 02/22/2012.  Findings: Slightly shallow inspiration. The heart size and pulmonary vascularity are normal. The lungs appear clear and expanded without focal air space disease or consolidation. No blunting of the costophrenic angles.  No pneumothorax.  Mediastinal contours appear intact.  No significant changes since previous study.  Surgical clips in the left axilla.  IMPRESSION: No evidence of active pulmonary disease.   Original Report Authenticated By: Marlon Pel, M.D.       Medications:    Infusions:    . sodium chloride 125 mL/hr at 06/07/12 1010     Scheduled Medications:    . colchicine  0.6 mg Oral Daily  . cyanocobalamin  1,000 mcg Intramuscular Q7 days  . ferrous sulfate  325 mg Oral TID WC  . heparin  5,000 Units Subcutaneous Q8H     PRN Medications: acetaminophen, acetaminophen, ondansetron (ZOFRAN) IV, ondansetron   Assessment/ Plan:   Mr. Adduci is a 51 year old male with a history of HIV (most recent CD4 count 370 on 05/05/12, VL undetectable), renal insufficiency with baseline creatinine~1.5, hypertension, recurrent lymphadenopathy and fever (since February 2012) thought to be benign reactive changes related to HIV. He presents to the ED with fever and lymphadenopathy since Tuesday. He has been admitted with similar symptoms in the past.  Fever and lymphadenopathy: Recurrent episodes with similar presentation. Likely seventh episode in last 1.5 years- with possible 5th admission. 2 axillary lymph node biopsy and bone marrow biopsy unrevealing for malignancy or infections. left axillary node bx 11/24/11: Benign lymph node tissue with regressively transformed germinal centers, prominent vascularity and extensive plasmacytosis, architecture generally preserved. Bone marrow Bx 02/24/12: Mild absolute neutropenia, increased bands and toxic granulation. Moderate normocytic, normochromic anemia, no schistocytes,  moderate thrombocytopenia. Patient with chronic splenomegaly. Likely HIV related lymphadenopathy.  Has been followed by oncologist Dr. Gaylyn Rong while in hospital and in the clinic. Autoimmune cause has been considered and ANA - negative. He was recently been seen by rheumatologist- Dr. Nickola Major- and has a followup appointment with her on coming Friday. So for all admissions and workup has been unrevealing- no known cause of lymphadenopathy and the general feeling is that these symptoms are related to HIV as opposed to cancer like leukemia or MDS. His bone marrow's cytogenetics were negative. There has been an extended discussion  regarding the cause of his fever episodes including infections, iatrogenic (Atripla can cause fevers) autoimmune disorders,malignancies and genetic disorders. Mr Pintor has no history of recent travel outside [place]. He was adopted as a child and therefore he does not know his family history since the adoption was of 'closed' type. At this time the main interest is vasculitis Vs a paraprotein syndrome like monogammopathy of undetermined significancy. Vasculitis is being considered given recurrent AKI whenever he gets flares of his condition together with elevated CRP and ESR.   Plan  - SPEP and Urine Electrophoresis -Follow up blood cultures x2 - no growth  - Tylenol when necessary for fever and pain  Acute on chronic kidney disease : Baseline creatinine about 1.4-1.6.  Creatinine 1.99<2.59<1.2<1.63<1.59. 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 Also had pyelonephritis in past. Patient does not seem to have UTI at present. Has granular casts in urine though. Patient hydrating himself at home with Gatorade but still c/o dark yellow urine. Unclear about the cause- but reflective of recent acute process ( as patient has worsening renal function with fever and lymphadenopathy in past). His history is significant for a renal stones. He has  followed up with Dr Hyman Hopes but no medical records at this time. The main concern, again is a possibility of a vasculitis going together with is symptoms. His renal function has improved with a creatinine of 1.99 today.  Plan  -Consult nephrology specifically to determine if a renal biopsy would be beneficial in his case.  - Will hydrate at 125cc/h tonight and hold Atripla.  -monitor urine outpt  - FENa is 0.2 (<1%) which suggest prerenal cause of his acute renal insufficiency. This is likely from dehydration as he reports doing a lot of yard work on Tuesday which is which is when the symptoms started. The Cr is improving from 2.59 yesterday to 2.23 this morning.  - Discussed  with Dr Orvan Falconer and he advices to [facility] and restarted after renal function improves. Acute Pancytopenia - hemoglobin 9.5<12.1, WBC 3.6<3.8< 6.4 , platelets 88<93 < 236 .  This has been an issue during previous admissions. History not suggestive of GI or other source of blood loss.  Likely related to acute process. Been followed by hematologist. Bone marrow biopsy and lymph node biopsies unrevealing as in #1. Last ferritin level 685 in May 2013. In June 2013, cultures from bone marrow aspirate was still negative for AFB, fungal, viral. His bone marrow's cytogenetics were negative. Therefore his low blood counts and fever were thought to be most likely reactive. There was no concern for primary bone marrow disease such as leukemia, lymphoma, MDS. Dr Jethro Bolus (oncologist) recommended that he follows up with his primary doctor and HIV  He was seen by Dr Nickola Major, a rheumatologist who orders several tests including ESR 55, C-reactive protein 8.5, HLA-B27 negative, CCP antibodies, IgG - negative, rheumatoid factor less than 13, ANA negative, C3 complement 147, C4 compliment 27, uric acid 7.0, and ANCA panel -all Negative. TSH 3.85, testosterone 2.77. X-ray of hand to rule osteoarthritis - normal. He has a followup appointment on  06/10/2012.  Plan: We will monitor CBC. Mr Talerico has had similar pancytopenia with his last admissions and test to purse cause has not revealed any cause other than possible HIV related.   HIV- compliant with Atripla. Dapsone recently stopped by Dr. Orvan Falconer. Recent CD4 count 370< 210<110. My only concern today is the issue to Atripla given Mr Caddock's  changes in renal function and history of renal stones.  Plan  -Will hold his atripla for now until rehydrated.  5) Hyponatremia: has been in low 130s over past few months, further decrease likely secondary to some volume depletion and hyperglycemia. Systolic blood pressure on lower side. He was not orthostatic. I suspect dehydration.  Sodium 127 - corrected sodium - 131-2  Plan  - continue with hydration with normal saline  - check BMET later today   HTN- systolic blood pressure on lower side.  Plan  - Hold Labetolol- restart as tolerated.  DM 2 : Hyperglycemia on admission-368 in setting of acute process  Last A1c 6.6 in May 2013 and repeat A1c shows 7.1% with elevated serum glucose.  Marland Kitchen He meets the diagnostic criteria for Diabetes. I have discussed with him concerning treatment and life style changes for diabetes and he is very motivated to try losing weight and improve diet. Not on any medications. He does not want to start medical therapy at as yet.   DVT Px- Heparin.     Length of Stay: 2 days   Signed by:  Dow Adolph PGY-I, Internal Medicine Pager [phone] 06/07/2012, 1:51 PM

## 2012-06-08 LAB — CBC WITH DIFFERENTIAL/PLATELET
Basophils Relative: 0 % (ref 0–1)
Eosinophils Absolute: 0.1 10*3/uL (ref 0.0–0.7)
HCT: 25.2 % — ABNORMAL LOW (ref 39.0–52.0)
Hemoglobin: 8.5 g/dL — ABNORMAL LOW (ref 13.0–17.0)
MCH: 30.2 pg (ref 26.0–34.0)
MCHC: 33.7 g/dL (ref 30.0–36.0)
MCV: 89.7 fL (ref 78.0–100.0)
Monocytes Absolute: 0.6 10*3/uL (ref 0.1–1.0)
Monocytes Relative: 21 % — ABNORMAL HIGH (ref 3–12)

## 2012-06-08 LAB — MICROALBUMIN / CREATININE URINE RATIO
Creatinine, Urine: 127.4 mg/dL
Microalb Creat Ratio: 31.4 mg/g — ABNORMAL HIGH (ref 0.0–30.0)

## 2012-06-08 LAB — COMPREHENSIVE METABOLIC PANEL
ALT: 10 U/L (ref 0–53)
AST: 14 U/L (ref 0–37)
CO2: 22 mEq/L (ref 19–32)
Calcium: 8.4 mg/dL (ref 8.4–10.5)
Sodium: 133 mEq/L — ABNORMAL LOW (ref 135–145)
Total Protein: 7 g/dL (ref 6.0–8.3)

## 2012-06-08 LAB — HEPATITIS PANEL, ACUTE
HCV Ab: NEGATIVE
Hepatitis B Surface Ag: NEGATIVE

## 2012-06-08 LAB — MPO/PR-3 (ANCA) ANTIBODIES: Serine Protease 3: 107 AU/mL — ABNORMAL HIGH (ref <20)

## 2012-06-08 LAB — GLOMERULAR BASEMENT MEMBRANE ANTIBODIES: GBM Ab: 1 AU/mL (ref <20)

## 2012-06-08 LAB — ANTI-DNA ANTIBODY, DOUBLE-STRANDED: ds DNA Ab: 21 IU/mL (ref <30)

## 2012-06-08 NOTE — Progress Notes (Signed)
PA Student Daily Progress Note [facility] Subjective:  Pt c/o a headache and body aches this am.  He is not able to localize his headache but thinks that his headaches are related to He reports fevers last night.    Objective:  Filed Vitals:   06/07/12 2354 06/08/12 0514 06/08/12 0920 06/08/12 1310  BP:  115/58 117/61 132/69  Pulse:  101 103 95  Temp: 98.5 F (36.9 C) 99 F (37.2 C) 98.9 F (37.2 C) 98.1 F (36.7 C)  TempSrc: Oral Oral    Resp:  17 18 18   Height:      Weight:      SpO2:  98% 98% 100%   Physical Exam: General: Pt is a well developed, well nourished, overweight caucasian male sitting comfortably in his chair in no acute distress.  Eyes, Ears, Nose Mouth, Throat: PERRL, Mucous membranes are moist.  Neck: Pt has diffuse, mildly tender cervical and supraclavicular lymphadenopathy.  Respiratory: pt breathing easily without use of accessory muscles, lungs clear to auscultation bilaterally.  Cardio: Regular rate and rhythm, S1 & S2, noted.  Abdomen: Soft, obese, abdomen distended more notably on the left side. Diffusely tender to palpation. Spleen enlarged and palpable below costal margin. Active BS.  Extremities: no lower extremity edema Neuro: Pt alert and oriented x3  Psych: Mood and affect appropriate  Assessment/Plan: 1. Acute Renal Failure on Chronic Kidney Disease: Pt's Creatinine has returned to baseline of 1.49.  Pt reports good urine output. UA has protein at baseline. Pt is not uremic. No need for dialysis at this time.  Monitor Creatinine Ordered serology to work up GN & vasculitis. ANA was negative, Compliments 3 & 4 are in normal range, Antistreptolysin O is slightly elevated at 71 but within the normal range.  Microalbumin in urine elevated at 4, microalbumin to Cr ratio slightly elevated at 31.4.  Abdominal ultrasound revealed a calculus in upper pole of rt kidney that is consistent with the location of a calculus located in his renal  ultrasound in May 2013.  Ultrasound also showed splenomegaly with spleen measuring 15.8 cm.  Monitor Cr and results of remaining pending serology.   2. Hyponatremia: Na+ increased to 133 today.  3. Fever and Lymphadenopathy: Workup in process. Atripla on hold with improving renal fxn.  If antiretroviral therapy restarted, recommend excluding Tenofovir to prevent ARF.  Recommend consulting ID.  4. DM II: New onset, pt received DM diet instruction yesterday 5. Pancytopenia: recurrent, pt has been worked up by hematology. Not likely due to CKD.  Labs: Basic Metabolic Panel:  Lab 06/08/12 1191 06/06/12 1630 06/06/12 0640  NA 133* 128* 129*  K 4.0 4.3 4.0  CL 103 98 96  CO2 22 22 22   GLUCOSE 287* 243* 273*  BUN 18 32* 33*  CREATININE 1.49* 1.99* 2.23*  CALCIUM 8.4 8.6 8.7  ALB -- -- --  PHOS -- -- --   Liver Function Tests:  Lab 06/08/12 1014 06/05/12 2103  AST 14 14  ALT 10 13  ALKPHOS 47 61  BILITOT 0.3 0.4  PROT 7.0 7.8  ALBUMIN 2.3* 2.8*   CBC:  Lab 06/06/12 0640 06/05/12 2219 06/05/12 2103  WBC 3.6* 3.8* 4.0  NEUTROABS -- 1.9 1.9  HGB 9.8* 9.5* 10.3*  HCT 29.2* 28.5* 30.8*  MCV 88.8 88.8 88.8  PLT 88* 93* 88*   Blood Culture    Component Value Date/Time   SDES BLOOD LEFT ARM 06/05/2012 2220   SPECREQUEST BOTTLES DRAWN AEROBIC AND  ANAEROBIC 10CC EACH 06/05/2012 2220   CULT        BLOOD CULTURE RECEIVED NO GROWTH TO DATE CULTURE WILL BE HELD FOR 5 DAYS BEFORE ISSUING A FINAL NEGATIVE REPORT 06/05/2012 2220   REPTSTATUS PENDING 06/05/2012 2220   Micro Results: Recent Results (from the past 240 hour(s))  CULTURE, BLOOD (ROUTINE X 2)     Status: Normal (Preliminary result)   Collection Time   06/05/12 10:10 PM      Component Value Range Status Comment   Specimen Description BLOOD RIGHT ARM   Final    Special Requests BOTTLES DRAWN AEROBIC AND ANAEROBIC 10CC EACH   Final    Culture  Setup Time 06/06/2012 08:11   Final    Culture     Final    Value:        BLOOD CULTURE  RECEIVED NO GROWTH TO DATE CULTURE WILL BE HELD FOR 5 DAYS BEFORE ISSUING A FINAL NEGATIVE REPORT   Report Status PENDING   Incomplete   CULTURE, BLOOD (ROUTINE X 2)     Status: Normal (Preliminary result)   Collection Time   06/05/12 10:20 PM      Component Value Range Status Comment   Specimen Description BLOOD LEFT ARM   Final    Special Requests BOTTLES DRAWN AEROBIC AND ANAEROBIC 10CC EACH   Final    Culture  Setup Time 06/06/2012 08:12   Final    Culture     Final    Value:        BLOOD CULTURE RECEIVED NO GROWTH TO DATE CULTURE WILL BE HELD FOR 5 DAYS BEFORE ISSUING A FINAL NEGATIVE REPORT   Report Status PENDING   Incomplete   MRSA PCR SCREENING     Status: Normal   Collection Time   06/06/12  1:05 AM      Component Value Range Status Comment   MRSA by PCR NEGATIVE  NEGATIVE Final    Studies/Results: US Abdomen Complete  06/07/2012  *RADIOLOGY REPORT*  Clinical Data:  Acute renal failure.  History kidney stones.  HIV with lymphadenopathy, splenomegaly, and fevers.  COMPLETE ABDOMINAL ULTRASOUND  Comparison:  Renal ultrasound 02/23/2012.  CT of the abdomen pelvis without contrast 11/22/2010.  Findings:  Gallbladder:  A 2.7 cm shadowing stone is stable.  There is no evidence for cholecystitis.  The gallbladder wall thickness is within normal limits at 2 mm.  Common bile duct:  Normal in caliber. No biliary ductal dilation.The maximal diameter is 3 mm.  Liver:  No focal lesion identified.  Within normal limits in parenchymal echogenicity.  IVC:  Appears normal.  Pancreas:  Although the pancreas is difficult to visualize in its entirety, no focal pancreatic abnormality is identified.  Spleen:  Spleen is markedly enlarged, measuring at least 15.8 cm. It is difficult to that the entire spleen lung field of view.  Right Kidney:  85 mm nonobstructing calculus at the upper pole of the right kidney is stable.  There is no hydronephrosis.  The right kidney length is 14.4 cm.  Left Kidney:  No  hydronephrosis.  Well-preserved cortex.  Normal size and parenchymal echotexture without focal abnormalities. The maximal length is 10.2 cm, stable.  Abdominal aorta:  No aneurysm identified.  IMPRESSION:  1.  Persistent splenomegaly. 2.  2.7 cm shadowing nonobstructing gallstone.  There is no evidence for hydronephrosis. 3.  Stable nonobstructive right upper pole kidney stone.   Original Report Authenticated By: Jamesetta Orleans. MATTERN, M.D.    Medications: Scheduled  Meds:   . colchicine  0.6 mg Oral Daily  . cyanocobalamin  1,000 mcg Intramuscular Q7 days  . ferrous sulfate  325 mg Oral TID WC  . heparin  5,000 Units Subcutaneous Q8H  . living well with diabetes book   Does not apply Once   Continuous Infusions:   . sodium chloride 125 mL/hr at 06/08/12 1140   PRN Meds:.acetaminophen, acetaminophen, ondansetron (ZOFRAN) IV, ondansetron   This is a Psychologist, occupational Note.  The care of the patient was discussed with Dr. Arrie Aran and the assessment and plan formulated with their assistance.  Please see their attached note for official documentation of the daily encounter.  Delmer Islam PA-S2 06/08/2012, 1:28 PM

## 2012-06-08 NOTE — Consult Note (Signed)
I have seen and examined this patient on 06/07/12 at 16:20 and agree with plan as outlined by Delmer Islam, PA S2. Pt with bulky lymphadenopathy, recurrent febrile illness associated with pancytopenia and AKI/CKD.  He has had bone marrow biopsy, CT guided bx of cervical LAD and we were asked to evaluate for possible renal cause.  Will order serologies and await CD4 count.  Agree with holding atripla as this can cause [name] similar picture.  Cannot proceed with renal biopsy in light of thrombocytopenia.  May benefit from surgical lymphnode excision and pathologic studies as lymphoma is on DDx.  Renal function is improving with IVF's and cessation of tenofovir.  Will continue to follow Patrick Fregeau A,MD 06/08/2012 9:26 AM

## 2012-06-08 NOTE — Progress Notes (Signed)
Subjective:    Interval Events:  He is still spiking fevers. Had temp 102F x 2 last night.  No new complaints.    Objective:    Vital Signs:   Temp:  [98.1 F (36.7 C)-102.9 F (39.4 C)] 99.2 F (37.3 C) (09/11 1648) Pulse Rate:  [94-110] 94  (09/11 1648) Resp:  [17-20] 20  (09/11 1648) BP: (107-154)/(58-79) 107/79 mmHg (09/11 1648) SpO2:  [98 %-100 %] 99 % (09/11 1648) Weight:  [239 lb 6.4 oz (108.591 kg)] 239 lb 6.4 oz (108.591 kg) (09/10 2229) Last BM Date: 06/06/12   Weights: 24-hour Weight change: -1.4 oz (-0.041 kg)  Filed Weights   06/06/12 2023 06/07/12 1713 06/07/12 2229  Weight: 235 lb 1.6 oz (106.641 kg) 235 lb 0.2 oz (106.6 kg) 239 lb 6.4 oz (108.591 kg)     Intake/Output:   Intake/Output Summary (Last 24 hours) at 06/08/12 1725 Last data filed at 06/08/12 1700  Gross per 24 hour  Intake   1610 ml  Output      0 ml  Net   1610 ml       Physical Exam: General appearance: alert, no distress and morbidly obese Head: Normocephalic, without obvious abnormality, atraumatic Cardio: regular rate and rhythm, S1, S2 normal, no murmur, click, rub or gallop GI: soft, non-tender; bowel sounds normal; no masses,  no organomegaly Extremities: extremities normal, atraumatic, no cyanosis or edema Lymph nodes: Cervical adenopathy: present bilaterally and Axillary adenopathy: present bilaterally  Neurologic: Grossly normal    Labs: Basic Metabolic Panel:  Lab 06/08/12 2025 06/06/12 1630 06/06/12 0640 06/05/12 2219 06/05/12 2103  NA 133* 128* 129* 127* 130*  K 4.0 4.3 4.0 4.1 4.5  CL 103 98 96 94* 96  CO2 22 22 22 23 22   GLUCOSE 287* 243* 273* 367* 278*  BUN 18 32* 33* 33* 32*  CREATININE 1.49* 1.99* 2.23* 2.59* 2.35*  CALCIUM 8.4 8.6 8.7 -- --  MG -- -- -- -- --  PHOS -- -- -- -- --    Liver Function Tests:  Lab 06/08/12 1014 06/05/12 2103  AST 14 14  ALT 10 13  ALKPHOS 47 61  BILITOT 0.3 0.4  PROT 7.0 7.8  ALBUMIN 2.3* 2.8*   No results  found for this basename: LIPASE:5,AMYLASE:5 in the last 168 hours No results found for this basename: AMMONIA:3 in the last 168 hours  CBC:  Lab 06/08/12 1548 06/06/12 0640 06/05/12 2219 06/05/12 2103  WBC 2.8* 3.6* 3.8* 4.0  NEUTROABS 1.2* -- 1.9 1.9  HGB 8.5* 9.8* 9.5* 10.3*  HCT 25.2* 29.2* 28.5* 30.8*  MCV 89.7 88.8 88.8 88.8  PLT 89* 88* 93* 88*    Cardiac Enzymes: No results found for this basename: CKTOTAL:5,CKMB:5,CKMBINDEX:5,TROPONINI:5 in the last 168 hours  BNP: No components found with this basename: POCBNP:5  CBG: No results found for this basename: GLUCAP:5 in the last 168 hours  Coagulation Studies: No results found for this basename: LABPROT:5,INR:5 in the last 72 hours  Microbiology: Results for orders placed during the hospital encounter of 06/05/12  CULTURE, BLOOD (ROUTINE X 2)     Status: Normal (Preliminary result)   Collection Time   06/05/12 10:10 PM      Component Value Range Status Comment   Specimen Description BLOOD RIGHT ARM   Final    Special Requests BOTTLES DRAWN AEROBIC AND ANAEROBIC 10CC EACH   Final    Culture  Setup Time 06/06/2012 08:11   Final    Culture  Final    Value:        BLOOD CULTURE RECEIVED NO GROWTH TO DATE CULTURE WILL BE HELD FOR 5 DAYS BEFORE ISSUING A FINAL NEGATIVE REPORT   Report Status PENDING   Incomplete   CULTURE, BLOOD (ROUTINE X 2)     Status: Normal (Preliminary result)   Collection Time   06/05/12 10:20 PM      Component Value Range Status Comment   Specimen Description BLOOD LEFT ARM   Final    Special Requests BOTTLES DRAWN AEROBIC AND ANAEROBIC 10CC EACH   Final    Culture  Setup Time 06/06/2012 08:12   Final    Culture     Final    Value:        BLOOD CULTURE RECEIVED NO GROWTH TO DATE CULTURE WILL BE HELD FOR 5 DAYS BEFORE ISSUING A FINAL NEGATIVE REPORT   Report Status PENDING   Incomplete   MRSA PCR SCREENING     Status: Normal   Collection Time   06/06/12  1:05 AM      Component Value Range Status  Comment   MRSA by PCR NEGATIVE  NEGATIVE Final     Other results:   Imaging: US Abdomen Complete  06/07/2012  *RADIOLOGY REPORT*  Clinical Data:  Acute renal failure.  History kidney stones.  HIV with lymphadenopathy, splenomegaly, and fevers.  COMPLETE ABDOMINAL ULTRASOUND  Comparison:  Renal ultrasound 02/23/2012.  CT of the abdomen pelvis without contrast 11/22/2010.  Findings:  Gallbladder:  A 2.7 cm shadowing stone is stable.  There is no evidence for cholecystitis.  The gallbladder wall thickness is within normal limits at 2 mm.  Common bile duct:  Normal in caliber. No biliary ductal dilation.The maximal diameter is 3 mm.  Liver:  No focal lesion identified.  Within normal limits in parenchymal echogenicity.  IVC:  Appears normal.  Pancreas:  Although the pancreas is difficult to visualize in its entirety, no focal pancreatic abnormality is identified.  Spleen:  Spleen is markedly enlarged, measuring at least 15.8 cm. It is difficult to that the entire spleen lung field of view.  Right Kidney:  85 mm nonobstructing calculus at the upper pole of the right kidney is stable.  There is no hydronephrosis.  The right kidney length is 14.4 cm.  Left Kidney:  No hydronephrosis.  Well-preserved cortex.  Normal size and parenchymal echotexture without focal abnormalities. The maximal length is 10.2 cm, stable.  Abdominal aorta:  No aneurysm identified.  IMPRESSION:  1.  Persistent splenomegaly. 2.  2.7 cm shadowing nonobstructing gallstone.  There is no evidence for hydronephrosis. 3.  Stable nonobstructive right upper pole kidney stone.   Original Report Authenticated By: Jamesetta Orleans. MATTERN, M.D.       Medications:    Infusions:    . sodium chloride 125 mL/hr at 06/08/12 1140     Scheduled Medications:    . colchicine  0.6 mg Oral Daily  . cyanocobalamin  1,000 mcg Intramuscular Q7 days  . ferrous sulfate  325 mg Oral TID WC  . heparin  5,000 Units Subcutaneous Q8H     PRN  Medications: acetaminophen, acetaminophen, ondansetron (ZOFRAN) IV, ondansetron   Assessment/ Plan:   Mr. Gibas is a 51 year old male with a history of HIV (most recent CD4 count 370 on 05/05/12, VL undetectable), renal insufficiency with baseline creatinine~1.5, hypertension, recurrent lymphadenopathy and fever (since February 2012) thought to be benign reactive changes related to HIV. He presents to the ED with fever  and lymphadenopathy since Tuesday. He has been admitted with similar symptoms in the past.   Fever and lymphadenopathy: Recurrent episodes with similar presentation. Likely seventh episode in last 1.5 years- with possible 5th admission. 2 axillary lymph is very is in an node biopsy and bone marrow biopsy unrevealing for malignancy or infections. left axillary node bx 11/24/11: Benign lymph node tissue with regressively transformed germinal centers, prominent vascularity and extensive plasmacytosis, architecture generally preserved. Bone marrow Bx 02/24/12: Mild absolute neutropenia, increased bands and toxic granulation. Moderate normocytic, normochromic anemia, no schistocytes, moderate thrombocytopenia. Patient with chronic splenomegaly. Likely HIV related lymphadenopathy.  Has been followed by oncologist Dr. Gaylyn Rong while in hospital and in the clinic. Autoimmune cause has been considered and ANA - negative. He was recently been seen by rheumatologist- Dr. Nickola Major- and has a followup appointment with her on coming Friday. So for all admissions and workup has been unrevealing- no known cause of lymphadenopathy and the general feeling is that these symptoms are related to HIV as opposed to cancer like leukemia or MDS. His bone marrow's cytogenetics were negative. There has been an extended discussion regarding the cause of his fever episodes including infections, iatrogenic (Atripla can cause fevers) autoimmune disorders,malignancies and genetic disorders. Mr Allday has no history of recent travel  outside [place]. He was adopted as a child and therefore he does not know his family history since the adoption was of 'closed' type. At this time the main interest is vasculitis Vs a paraprotein syndrome like monogammopathy of undetermined significancy. Vasculitis is being considered given recurrent AKI whenever he gets flares of his condition together with elevated CRP and ESR.   Plan  - SPEP and Urine Electrophoresis -Follow up blood cultures x2 - no growth  - Tylenol when necessary for fever and pain  Acute on chronic kidney disease : Baseline creatinine about 1.4-1.6.  Creatinine 1.49<1.99<2.59<1.2<1.63<1.59. 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 Also had pyelonephritis in past. Patient does not seem to have UTI at present. Has granular casts in urine though. Patient hydrating himself at home with Gatorade but still c/o dark yellow urine. Unclear about the cause- but reflective of recent acute process ( as patient has worsening renal function with fever and lymphadenopathy in past). His history is significant for a renal stones. He has followed up with Dr Hyman Hopes but no medical records at this time. The main concern, again is a possibility of a vasculitis going together with is symptoms. His renal function has improved with a creatinine of 1.99 today.  Plan  -Consult nephrology specifically to determine if a renal biopsy would be beneficial in his case.  - Will hydrate at 125cc/h tonight and hold Atripla.  -monitor urine outpt  - FENa is 0.2 (<1%) which suggest prerenal cause of his acute renal insufficiency. This is likely from dehydration as he reports doing a lot of yard work on Tuesday which is which is when the symptoms started.  -Renal consulted and orders a few more labs including the ANA, C3 and C4 complement level, antiglomerular basement membrane antibodies, antistreptolysin O titers. -Noted kappa free light chains free light chains  and lambda free light chains.  Acute Pancytopenia - hemoglobin 9.5<12.1, WBC 3.6<3.8< 6.4 , platelets 88<93 < 236 .  This has been an issue during previous admissions. History not suggestive of GI or other source of blood loss.  Likely related to acute process. Been followed by hematologist. Bone marrow biopsy and lymph node biopsies  unrevealing as in #1. Last ferritin level 685 in May 2013. In June 2013, cultures from bone marrow aspirate was still negative for AFB, fungal, viral. His bone marrow's cytogenetics were negative. Therefore his low blood counts and fever were thought to be most likely reactive. There was no concern for primary bone marrow disease such as leukemia, lymphoma, MDS. Dr Jethro Bolus (oncologist) recommended that he follows up with his primary doctor and HIV  He was seen by Dr Nickola Major, a rheumatologist who orders several tests including ESR 55, C-reactive protein 8.5, HLA-B27 negative, CCP antibodies, IgG - negative, rheumatoid factor less than 13, ANA negative, C3 complement 147, C4 compliment 27, uric acid 7.0, and ANCA panel -all Negative. TSH 3.85, testosterone 2.77. X-ray of hand to rule osteoarthritis - normal. He has a followup appointment on 06/10/2012.  Plan: We will monitor CBC. Mr Alcide has had similar pancytopenia with his last admissions and test to purse cause has not revealed any cause other than possible HIV related.   HIV- compliant with Atripla. Dapsone recently stopped by Dr. Orvan Falconer. Recent CD4 count 370< 210<110. My only concern today is the issue to Atripla given Mr Caddock's changes in renal function and history of renal stones.  Plan  -Will hold his atripla for now until rehydrated.  5) Hyponatremia: has been in low 130s over past few months, further decrease likely secondary to some volume depletion and hyperglycemia. Systolic blood pressure on lower side. He was not orthostatic. I suspect dehydration.  Sodium 127 - corrected sodium - 131-2  Plan  -  continue with hydration with normal saline  - check BMET later today   HTN- systolic blood pressure on lower side.  Plan  - Hold Labetolol- restart as tolerated.  DM 2 : Hyperglycemia on admission-368 in setting of acute process  Last A1c 6.6 in May 2013 and repeat A1c shows 7.1% with elevated serum glucose.  Marland Kitchen He meets the diagnostic criteria for Diabetes. I have discussed with him concerning treatment and life style changes for diabetes and he is very motivated to try losing weight and improve diet. Not on any medications. He does not want to start medical therapy at as yet.   DVT Px- Heparin.     Length of Stay: 3 days   Signed by:  Dow Adolph PGY-I, Internal Medicine Pager [phone] 06/08/2012, 5:25 PM

## 2012-06-08 NOTE — Progress Notes (Signed)
Internal Medicine Teaching Service Attending Dr.Londyn Hotard I have examined the patient at bedside today and discussed the management of the Patient with the resident team.Patrick [name] is being evaluated for recurrent episodes of fever lymphadenopathy and ARF. 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 Pending ID recommendation for HAART treatment.

## 2012-06-08 NOTE — Progress Notes (Signed)
I have seen and examined this patient and agree with plan as outlined by Delmer Islam PA-S2.  I have reviewed his old surgical path reports and he has had excisions of lymph nodes without evidence of lymphoma.  I am concerned that this pattern has recurred mainly due to the resumption of tenofovir after each hospitalization.  It appears that this has been held during hospitalizations with resolution of symptoms, only to recur after his meds are resumed.  Recommend consultation with Dr. Orvan Falconer to see if he concurs with this plan and to assist with new medications as his CD4 count has dropped below 200.  Renal function continues to improve and is now at baseline.  Would not recommend renal biopsy at this point. Alphons Burgert A,MD 06/08/2012 4:44 PM

## 2012-06-09 ENCOUNTER — Encounter (HOSPITAL_COMMUNITY): Payer: Self-pay

## 2012-06-09 ENCOUNTER — Inpatient Hospital Stay (HOSPITAL_COMMUNITY): Payer: Medicaid Other

## 2012-06-09 LAB — COMPREHENSIVE METABOLIC PANEL
AST: 14 U/L (ref 0–37)
Alkaline Phosphatase: 42 U/L (ref 39–117)
BUN: 15 mg/dL (ref 6–23)
CO2: 21 mEq/L (ref 19–32)
Chloride: 105 mEq/L (ref 96–112)
Creatinine, Ser: 1.28 mg/dL (ref 0.50–1.35)
GFR calc non Af Amer: 64 mL/min — ABNORMAL LOW (ref >90)
Total Bilirubin: 0.2 mg/dL — ABNORMAL LOW (ref 0.3–1.2)

## 2012-06-09 LAB — IMMUNOFIXATION ELECTROPHORESIS
IgM, Serum: 51 mg/dL — ABNORMAL LOW (ref 41–251)
Total Protein ELP: 6.5 g/dL (ref 6.0–8.3)

## 2012-06-09 LAB — CBC
MCH: 29.3 pg (ref 26.0–34.0)
Platelets: 96 10*3/uL — ABNORMAL LOW (ref 150–400)
RBC: 2.73 MIL/uL — ABNORMAL LOW (ref 4.22–5.81)
RDW: 15.2 % (ref 11.5–15.5)
WBC: 2.8 10*3/uL — ABNORMAL LOW (ref 4.0–10.5)

## 2012-06-09 LAB — PROTEIN ELECTROPHORESIS, SERUM
Albumin ELP: 37.3 % — ABNORMAL LOW (ref 55.8–66.1)
Alpha-1-Globulin: 11.9 % — ABNORMAL HIGH (ref 2.9–4.9)
Alpha-2-Globulin: 12.9 % — ABNORMAL HIGH (ref 7.1–11.8)
Beta 2: 4.9 % (ref 3.2–6.5)
Beta Globulin: 6 % (ref 4.7–7.2)

## 2012-06-09 MED ORDER — INFLUENZA VIRUS VACC SPLIT PF IM SUSP
0.5000 mL | INTRAMUSCULAR | Status: AC
  Filled 2012-06-09: qty 0.5

## 2012-06-09 MED ORDER — COLCHICINE 0.6 MG PO TABS
0.6000 mg | ORAL_TABLET | Freq: Two times a day (BID) | ORAL | Status: DC
  Filled 2012-06-09: qty 1

## 2012-06-09 NOTE — Progress Notes (Signed)
Subjective:    Interval Events:  He is still spiking fevers but overall feels better since admission.  No new complaints. I talked to him about his results of SPEP and ultrasound of the abdomen. He is concerned about his spleen size.     Objective:    Vital Signs:   Temp:  [98 F (36.7 C)-102.9 F (39.4 C)] 98 F (36.7 C) (09/12 0515) Pulse Rate:  [91-103] 91  (09/12 0515) Resp:  [18-20] 18  (09/12 0515) BP: (107-132)/(57-79) 116/61 mmHg (09/12 0515) SpO2:  [97 %-100 %] 100 % (09/12 0515) Weight:  [256 lb 1.6 oz (116.166 kg)] 256 lb 1.6 oz (116.166 kg) (09/11 2100) Last BM Date: 06/08/12   Weights: 24-hour Weight change: 21 lb 1.4 oz (9.566 kg)  Filed Weights   06/07/12 1713 06/07/12 2229 06/08/12 2100  Weight: 235 lb 0.2 oz (106.6 kg) 239 lb 6.4 oz (108.591 kg) 256 lb 1.6 oz (116.166 kg)     Intake/Output:   Intake/Output Summary (Last 24 hours) at 06/09/12 0805 Last data filed at 06/09/12 1308  Gross per 24 hour  Intake 4085.42 ml  Output      0 ml  Net 4085.42 ml       Physical Exam: General appearance: alert, no distress and morbidly obese Head: Normocephalic, without obvious abnormality, atraumatic Cardio: regular rate and rhythm, S1, S2 normal, no murmur, click, rub or gallop GI: soft, non-tender; bowel sounds normal; no masses,  no organomegaly Extremities: extremities normal, atraumatic, no cyanosis or edema Lymph nodes: Cervical adenopathy: present bilaterally and Axillary adenopathy: present bilaterally  Neurologic: Grossly normal    Labs: Basic Metabolic Panel:  Lab 06/09/12 6578 06/08/12 1014 06/06/12 1630 06/06/12 0640 06/05/12 2219  NA 135 133* 128* 129* 127*  K 4.0 4.0 4.3 4.0 4.1  CL 105 103 98 96 94*  CO2 21 22 22 22 23   GLUCOSE 218* 287* 243* 273* 367*  BUN 15 18 32* 33* 33*  CREATININE 1.28 1.49* 1.99* 2.23* 2.59*  CALCIUM 8.4 8.4 8.6 -- --  MG -- -- -- -- --  PHOS -- -- -- -- --    Liver Function Tests:  Lab 06/09/12 0615  06/08/12 1014 06/05/12 2103  AST 14 14 14   ALT 11 10 13   ALKPHOS 42 47 61  BILITOT 0.2* 0.3 0.4  PROT 6.7 7.0 7.8  ALBUMIN 2.1* 2.3* 2.8*   No results found for this basename: LIPASE:5,AMYLASE:5 in the last 168 hours No results found for this basename: AMMONIA:3 in the last 168 hours  CBC:  Lab 06/09/12 0615 06/08/12 1548 06/06/12 0640 06/05/12 2219 06/05/12 2103  WBC 2.8* 2.8* 3.6* 3.8* 4.0  NEUTROABS -- 1.2* -- 1.9 1.9  HGB 8.0* 8.5* 9.8* 9.5* 10.3*  HCT 24.6* 25.2* 29.2* 28.5* 30.8*  MCV 90.1 89.7 88.8 88.8 88.8  PLT 96* 89* 88* 93* 88*    Cardiac Enzymes: No results found for this basename: CKTOTAL:5,CKMB:5,CKMBINDEX:5,TROPONINI:5 in the last 168 hours  BNP: No components found with this basename: POCBNP:5  CBG: No results found for this basename: GLUCAP:5 in the last 168 hours  Coagulation Studies: No results found for this basename: LABPROT:5,INR:5 in the last 72 hours  Microbiology: Results for orders placed during the hospital encounter of 06/05/12  CULTURE, BLOOD (ROUTINE X 2)     Status: Normal (Preliminary result)   Collection Time   06/05/12 10:10 PM      Component Value Range Status Comment   Specimen Description BLOOD RIGHT ARM  Final    Special Requests BOTTLES DRAWN AEROBIC AND ANAEROBIC 10CC EACH   Final    Culture  Setup Time 06/06/2012 08:11   Final    Culture     Final    Value:        BLOOD CULTURE RECEIVED NO GROWTH TO DATE CULTURE WILL BE HELD FOR 5 DAYS BEFORE ISSUING A FINAL NEGATIVE REPORT   Report Status PENDING   Incomplete   CULTURE, BLOOD (ROUTINE X 2)     Status: Normal (Preliminary result)   Collection Time   06/05/12 10:20 PM      Component Value Range Status Comment   Specimen Description BLOOD LEFT ARM   Final    Special Requests BOTTLES DRAWN AEROBIC AND ANAEROBIC 10CC EACH   Final    Culture  Setup Time 06/06/2012 08:12   Final    Culture     Final    Value:        BLOOD CULTURE RECEIVED NO GROWTH TO DATE CULTURE WILL BE HELD FOR  5 DAYS BEFORE ISSUING A FINAL NEGATIVE REPORT   Report Status PENDING   Incomplete   MRSA PCR SCREENING     Status: Normal   Collection Time   06/06/12  1:05 AM      Component Value Range Status Comment   MRSA by PCR NEGATIVE  NEGATIVE Final     Other results:   Imaging: US Abdomen Complete  06/07/2012  *RADIOLOGY REPORT*  Clinical Data:  Acute renal failure.  History kidney stones.  HIV with lymphadenopathy, splenomegaly, and fevers.  COMPLETE ABDOMINAL ULTRASOUND  Comparison:  Renal ultrasound 02/23/2012.  CT of the abdomen pelvis without contrast 11/22/2010.  Findings:  Gallbladder:  A 2.7 cm shadowing stone is stable.  There is no evidence for cholecystitis.  The gallbladder wall thickness is within normal limits at 2 mm.  Common bile duct:  Normal in caliber. No biliary ductal dilation.The maximal diameter is 3 mm.  Liver:  No focal lesion identified.  Within normal limits in parenchymal echogenicity.  IVC:  Appears normal.  Pancreas:  Although the pancreas is difficult to visualize in its entirety, no focal pancreatic abnormality is identified.  Spleen:  Spleen is markedly enlarged, measuring at least 15.8 cm. It is difficult to that the entire spleen lung field of view.  Right Kidney:  85 mm nonobstructing calculus at the upper pole of the right kidney is stable.  There is no hydronephrosis.  The right kidney length is 14.4 cm.  Left Kidney:  No hydronephrosis.  Well-preserved cortex.  Normal size and parenchymal echotexture without focal abnormalities. The maximal length is 10.2 cm, stable.  Abdominal aorta:  No aneurysm identified.  IMPRESSION:  1.  Persistent splenomegaly. 2.  2.7 cm shadowing nonobstructing gallstone.  There is no evidence for hydronephrosis. 3.  Stable nonobstructive right upper pole kidney stone.   Original Report Authenticated By: Jamesetta Orleans. MATTERN, M.D.       Medications:    Infusions:    . sodium chloride 125 mL/hr at 06/08/12 1140     Scheduled  Medications:    . colchicine  0.6 mg Oral Daily  . cyanocobalamin  1,000 mcg Intramuscular Q7 days  . ferrous sulfate  325 mg Oral TID WC  . heparin  5,000 Units Subcutaneous Q8H     PRN Medications: acetaminophen, acetaminophen, ondansetron (ZOFRAN) IV, ondansetron   Assessment/ Plan:   Patrick [name] is a 51 year old male with a history of HIV (most recent  CD4 count 370 on 05/05/12, VL undetectable), renal insufficiency with baseline creatinine~1.5, hypertension, recurrent lymphadenopathy and fever (since February 2012) thought to be benign reactive changes related to HIV. He presents to the ED with fever and lymphadenopathy since Tuesday. He has been admitted with similar symptoms in the past.   Fever and lymphadenopathy: Recurrent episodes with similar presentation. Likely seventh episode in last 1.5 years- with possible 5th admission. 2 axillary lymph is very is in an node biopsy and bone marrow biopsy unrevealing for malignancy or infections. left axillary node bx 11/24/11: Benign lymph node tissue with regressively transformed germinal centers, prominent vascularity and extensive plasmacytosis, architecture generally preserved. Bone marrow Bx 02/24/12: Mild absolute neutropenia, increased bands and toxic granulation. Moderate normocytic, normochromic anemia, no schistocytes, moderate thrombocytopenia. Patient with chronic splenomegaly. Likely HIV related lymphadenopathy.  Has been followed by oncologist Dr. Gaylyn Rong while in hospital and in the clinic. Autoimmune cause has been considered and ANA - negative. He was recently been seen by rheumatologist- Dr. Nickola Major- and has a followup appointment with her on coming Friday. So for all admissions and workup has been unrevealing- no known cause of lymphadenopathy and the general feeling is that these symptoms are related to HIV as opposed to cancer like leukemia or MDS. His bone marrow's cytogenetics were negative. There has been an extended discussion  regarding the cause of his fever episodes including infections, iatrogenic (Atripla can cause fevers) autoimmune disorders,malignancies and genetic disorders. Patrick [name] has no history of recent travel outside [place]. He was adopted as a child and therefore he does not know his family history since the adoption was of 'closed' type. At this time the main interest is vasculitis Vs a paraprotein syndrome like monogammopathy of undetermined significancy. Vasculitis is being considered given recurrent AKI whenever he gets flares of his condition together with elevated CRP and ESR.  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 IgG = 2318, Serum free Kappa = 8.9, free lambda 12.9, with kappa/lambda ration - normal.  Urine free kappa 292, and free lambda = 5.38. IgA and IgM are normal. Immunofixation results are pending.  Serine protease is elevated at 107. Wil discuss with the team about consulting hemo/onco. Plan  - SPEP and Urine Electrophoresis shows increased levels of the free kappa and lambda light chains in both serum and urine . Serum IgG levels are also elevated.  -Follow up blood cultures x2 - no growth  - Tylenol when necessary for fever and pain  Acute on chronic kidney disease : Baseline creatinine about 1.4-1.6.  Creatinine 1.28<1.49<1.99<2.59<1.2<1.63<1.59. 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 Also had pyelonephritis in past. Patient does not seem to have UTI at present. Has granular casts in urine though. Patient hydrating himself at home with Gatorade but still c/o dark yellow urine. Unclear about the cause- but reflective of recent acute process ( as patient has worsening renal function with fever and lymphadenopathy in past). His history is significant for a renal stones. He has followed up with Dr Hyman Hopes but no medical records at this time. The main concern,  again is a possibility of a vasculitis going together with is symptoms. Renal ultrasoung -1. Persistent splenomegaly. 2. 2.7 cm shadowing nonobstructing gallstone. There is no evidence for hydronephrosis. 3. Stable nonobstructive right upper pole kidney stone. His renal function has continued to improve with a creatinine of 1.28<1.99 today.  Plan  -  Consult nephrology - not considering a renal biopsy because of low platelets.  - Will hydrate at 125cc/h tonight and hold Atripla.  -monitor urine outpt  - FENa is 0.2 (<1%) which suggest prerenal cause of his acute renal insufficiency. This is likely from dehydration as he reports doing a lot of yard work on Tuesday which is which is when the symptoms started.  -Renal consulted and orders a few more labs including the ANA, C3 and C4 complement level, antiglomerular basement membrane antibodies, antistreptolysin O titers. -Noted kappa free light chains free light chains and lambda free light chains. This might explain renal insuffiency with with light chain deposition.      Acute Pancytopenia - hemoglobin 8.0<9.5<12.1, WBC 3.6<3.8< 6.4 , platelets 88<93 < 236 .  This has been an issue during previous admissions. History not suggestive of GI or other source of blood loss.  Likely related to acute process. Been followed by hematologist. Bone marrow biopsy and lymph node biopsies unrevealing as in #1. Last ferritin level 685 in May 2013. In June 2013, cultures from bone marrow aspirate was still negative for AFB, fungal, viral. His bone marrow's cytogenetics were negative. Therefore his low blood counts and fever were thought to be most likely reactive. There was no concern for primary bone marrow disease such as leukemia, lymphoma, MDS. Dr Jethro Bolus (oncologist) recommended that he follows up with his primary doctor and HIV  He was seen by Dr Nickola Major, a rheumatologist who orders several tests including ESR 55, C-reactive protein 8.5, HLA-B27 negative, CCP  antibodies, IgG - negative, rheumatoid factor less than 13, ANA negative, C3 complement 147, C4 compliment 27, uric acid 7.0, and ANCA panel -all Negative. TSH 3.85, testosterone 2.77. X-ray of hand to rule osteoarthritis - normal. He has a followup appointment on 06/10/2012.  Plan:  -We will monitor CBC. Patrick [name] has had similar pancytopenia with his last admissions and test to purse cause has not revealed any cause other than possible HIV related.  - Low hemo - there is a dropped by 2 points over his duration of hospitalisation - 4 days. I don't suspect acute hemorrhage but I think this might be due to the acute pancytopenia which bone marrow suppression.    HIV- compliant with Atripla. Dapsone recently stopped by Dr. Orvan Falconer. Recent CD4 count 180<370< 210<110. VL remains <20. My only concern today is the issue to Atripla given Patrick [name]'s changes in renal function and history of renal stones.  Plan  -Will hold his atripla for now until rehydrated.  - Will talk to Dr Orvan Falconer about the way forward   Hyponatremia - resolved: has been in low 130s over past few months, further decrease likely secondary to some volume depletion and hyperglycemia. Systolic blood pressure on lower side. He was not orthostatic. I suspect dehydration.  Sodium 127 - corrected sodium - 131-2  Plan  - continue with hydration with normal saline  - check BMET later today   HTN- systolic blood pressure on lower side.  Plan  - Hold Labetolol- restart as tolerated.  DM 2 : Hyperglycemia on admission-368 in setting of acute process  Last A1c 6.6 in May 2013 and repeat A1c shows 7.1% with elevated serum glucose.  Marland Kitchen He meets the diagnostic criteria for Diabetes. I have discussed with him concerning treatment and life style changes for diabetes and he is very motivated to try losing weight and improve diet. Not on any medications. He does not want to start medical therapy at  as yet.   DVT Px- Heparin.    Length of  Stay: 4 days   Signed by:  Dow Adolph PGY-I, Internal Medicine Pager [phone] 06/09/2012, 8:05 AM

## 2012-06-09 NOTE — Progress Notes (Signed)
Internal Medicine Teaching Service Attending Dr.Marlayna Bannister I have examined the patient at bedside today and discussed the management of the Patient with the resident. 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 But given his symptoms and his Poly gammopathy and his h/o sinus problems which have increased in the last 3 years and his dry cough which parallels his acute episodes a renal biopsy would be a great modality of diagnosis. Also CT head for sinus involvement.

## 2012-06-09 NOTE — Progress Notes (Signed)
Inpatient Diabetes Program Recommendations  AACE/ADA: New Consensus Statement on Inpatient Glycemic Control (2013)  Target Ranges:  Prepandial:   less than 140 mg/dL      Peak postprandial:   less than 180 mg/dL (1-2 hours)      Critically ill patients:  140 - 180 mg/dL   Reason for Visit: Elevated glucose while here.  Pt running fevers while here.  Hyperglycemia is not helpful to resolving infection.  Inpatient Diabetes Program Recommendations Insulin - Basal: While here, please add 10 units Lantus daily or HS Correction (SSI): Pt would benefit from correction tidwc and HS scale while here. Oral Agents: A1C this admission at 7.1%. However, this may be a false elevation due to low hemoglobin Diet: Added Carbohydrate modified  Note: Thank you, Lenor Coffin, RN, CNS, Diabetes Coordinator [phone])

## 2012-06-09 NOTE — Progress Notes (Signed)
PA Student Daily Progress Note [facility]  Subjective: Pt feels slightly better today.  He reports some fevers last night but no headache today.  He says that at admission his energy level was at < 30% of normal and now it has increased to > 50% of his normal energy level.  He c/o an intermittent dry cough that has been occuring since the onset of his symptoms last week but has gotten worse over the past 24 hrs.  He reports coughing fits that woke him from sleep.  He also reports poor sleep last night due to needing his IV replaced in the middle of the night.    Objective:  Filed Vitals:   06/08/12 1802 06/08/12 1922 06/08/12 2100 06/09/12 0515  BP:   125/57 116/61  Pulse:   98 91  Temp: 98.9 F (37.2 C) 102.9 F (39.4 C) 99.2 F (37.3 C) 98 F (36.7 C)  TempSrc: Oral Oral Oral Oral  Resp:   20 18  Height:      Weight:   116.166 kg (256 lb 1.6 oz)   SpO2:   97% 100%   Physical Exam: General: Pt is a well developed, well nourished, overweight caucasian male laying comfortably in his bed in no acute distress.  Eyes, Ears, Nose Mouth, Throat: PERRL, Mucous membranes are moist.  Neck: Pt has continued, diffuse cervical and supraclavicular lymphadenopathy with no significant change. Respiratory: pt breathing easily without use of accessory muscles, faint wheeze auscultated on expiration bilaterally, no rales or rhonchi.  Cardio: Regular rate and rhythm, S1 & S2, noted.  Abdomen: Soft, obese, abdomen distended more notably on the left side. Diffusely tender to palpation. Spleen enlarged and palpable below costal margin. Active BS.  Extremities: no lower extremity edema  Neuro: Pt alert and oriented x3  Psych: Mood and affect appropriate  Assessment/Plan: 1. Acute Renal Failure on Chronic Kidney Disease: Pt's Creatinine is back to normal today at 1.28. Pt reports continued good urine output. UA has protein at baseline.  Work up for [employer] & vasculitis: Pt has elevated Serine  Protease 3 at 107 raising suspicion for vasculitis, specifically Wegner's Granulomatosis.  ANCA is pending.  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  Glomerular basement membrane antibodies were normal and his viral hepatitis panel was negative. Antistreptolysin O is slightly elevated at 71 but within the normal range. Microalbumin in urine elevated at 4, microalbumin to Cr ratio slightly elevated at 31.4. Abdominal ultrasound revealed a calculus in upper pole of rt kidney that is consistent with the location of a calculus located in his renal ultrasound in May 2013. Ultrasound also showed splenomegaly with spleen measuring 15.8 cm. Monitor Cr and results of remaining pending serology.  2. Hyponatremia: Na+ continues to trend up today within the normal range at 135.   3. Fever and Lymphadenopathy: Workup in process. Atripla on hold with improving renal fxn. If antiretroviral therapy restarted, recommend excluding Tenofovir to prevent ARF. Recommend consulting ID.  4. DM II: New onset, pt received DM diet instruction during this admission  5. Pancytopenia: recurrent, pt has been worked up by hematology. Not likely due to CKD. Platelets trending up today at 96.  Labs: Basic Metabolic Panel:  Lab 06/09/12 0981 06/08/12 1014 06/06/12 1630  NA 135 133* 128*  K 4.0 4.0 4.3  CL 105 103 98  CO2 21 22 22   GLUCOSE 218* 287* 243*  BUN 15 18 32*  CREATININE 1.28 1.49* 1.99*  CALCIUM 8.4 8.4 8.6  ALB -- -- --  PHOS -- -- --   Liver Function Tests:  Lab 06/09/12 0615 06/08/12 1014 06/05/12 2103  AST 14 14 14   ALT 11 10 13   ALKPHOS 42 47 61  BILITOT 0.2* 0.3 0.4  PROT 6.7 7.0 7.8  ALBUMIN 2.1* 2.3* 2.8*   CBC:  Lab 06/09/12 0615 06/08/12 1548 06/06/12 0640 06/05/12 2219 06/05/12 2103  WBC 2.8* 2.8* 3.6* -- --  NEUTROABS -- 1.2* -- 1.9 1.9  HGB 8.0* 8.5* 9.8* -- --  HCT 24.6* 25.2* 29.2* -- --  MCV 90.1 89.7 88.8 88.8 88.8  PLT 96* 89* 88* -- --    Blood Culture    Component Value Date/Time   SDES BLOOD LEFT ARM 06/05/2012 2220   SPECREQUEST BOTTLES DRAWN AEROBIC AND ANAEROBIC 10CC EACH 06/05/2012 2220   CULT        BLOOD CULTURE RECEIVED NO GROWTH TO DATE CULTURE WILL BE HELD FOR 5 DAYS BEFORE ISSUING A FINAL NEGATIVE REPORT 06/05/2012 2220   REPTSTATUS PENDING 06/05/2012 2220   Micro Results: Recent Results (from the past 240 hour(s))  CULTURE, BLOOD (ROUTINE X 2)     Status: Normal (Preliminary result)   Collection Time   06/05/12 10:10 PM      Component Value Range Status Comment   Specimen Description BLOOD RIGHT ARM   Final    Special Requests BOTTLES DRAWN AEROBIC AND ANAEROBIC 10CC EACH   Final    Culture  Setup Time 06/06/2012 08:11   Final    Culture     Final    Value:        BLOOD CULTURE RECEIVED NO GROWTH TO DATE CULTURE WILL BE HELD FOR 5 DAYS BEFORE ISSUING A FINAL NEGATIVE REPORT   Report Status PENDING   Incomplete   CULTURE, BLOOD (ROUTINE X 2)     Status: Normal (Preliminary result)   Collection Time   06/05/12 10:20 PM      Component Value Range Status Comment   Specimen Description BLOOD LEFT ARM   Final    Special Requests BOTTLES DRAWN AEROBIC AND ANAEROBIC 10CC EACH   Final    Culture  Setup Time 06/06/2012 08:12   Final    Culture     Final    Value:        BLOOD CULTURE RECEIVED NO GROWTH TO DATE CULTURE WILL BE HELD FOR 5 DAYS BEFORE ISSUING A FINAL NEGATIVE REPORT   Report Status PENDING   Incomplete   MRSA PCR SCREENING     Status: Normal   Collection Time   06/06/12  1:05 AM      Component Value Range Status Comment   MRSA by PCR NEGATIVE  NEGATIVE Final    Studies/Results: US Abdomen Complete  06/07/2012  *RADIOLOGY REPORT*  Clinical Data:  Acute renal failure.  History kidney stones.  HIV with lymphadenopathy, splenomegaly, and fevers.  COMPLETE ABDOMINAL ULTRASOUND  Comparison:  Renal ultrasound 02/23/2012.  CT of the abdomen pelvis without contrast 11/22/2010.  Findings:  Gallbladder:  A 2.7 cm  shadowing stone is stable.  There is no evidence for cholecystitis.  The gallbladder wall thickness is within normal limits at 2 mm.  Common bile duct:  Normal in caliber. No biliary ductal dilation.The maximal diameter is 3 mm.  Liver:  No focal lesion identified.  Within normal limits in parenchymal echogenicity.  IVC:  Appears normal.  Pancreas:  Although the pancreas is difficult to visualize in its  entirety, no focal pancreatic abnormality is identified.  Spleen:  Spleen is markedly enlarged, measuring at least 15.8 cm. It is difficult to that the entire spleen lung field of view.  Right Kidney:  85 mm nonobstructing calculus at the upper pole of the right kidney is stable.  There is no hydronephrosis.  The right kidney length is 14.4 cm.  Left Kidney:  No hydronephrosis.  Well-preserved cortex.  Normal size and parenchymal echotexture without focal abnormalities. The maximal length is 10.2 cm, stable.  Abdominal aorta:  No aneurysm identified.  IMPRESSION:  1.  Persistent splenomegaly. 2.  2.7 cm shadowing nonobstructing gallstone.  There is no evidence for hydronephrosis. 3.  Stable nonobstructive right upper pole kidney stone.   Original Report Authenticated By: Jamesetta Orleans. MATTERN, M.D.    Medications: Scheduled Meds:   . colchicine  0.6 mg Oral Daily  . cyanocobalamin  1,000 mcg Intramuscular Q7 days  . ferrous sulfate  325 mg Oral TID WC  . heparin  5,000 Units Subcutaneous Q8H   Continuous Infusions:   . sodium chloride 125 mL/hr at 06/08/12 1140   PRN Meds:.acetaminophen, acetaminophen, ondansetron (ZOFRAN) IV, ondansetron   This is a Psychologist, occupational Note.  The care of the patient was discussed with Dr. Arrie Aran and the assessment and plan formulated with their assistance.  Please see their attached note for official documentation of the daily encounter.  Delmer Islam PA-S2 06/09/2012, 7:57 AM

## 2012-06-09 NOTE — ED Provider Notes (Signed)
History     CSN: [id number]  Arrival date & time 06/05/12  2033   First MD Initiated Contact with Patient 06/05/12 2134      Chief Complaint  Patient presents with  . Fever     HPI HIV pt presenting with fever of over 101 at home, enlarged lymph nodes, congestion, cough with production and dizziness. C/o right groin pain and left sided abdominal pain.  Pale, in color.  Past Medical History  Diagnosis Date  . Hypertension   . HIV (human immunodeficiency virus infection) 2010  . Hypogonadism male   . CKD (chronic kidney disease) stage 3, GFR 30-59 ml/min     hydrated cr 1.5 gfr 48  . Kidney stones     "quite a few times"  . High cholesterol   . Heart murmur   . Exertional dyspnea 02/25/12    "and lying down"  . Anemia   . Blood transfusion   . Hernia     "surgical; where appendix ruptured"  . Pancytopenia with fever 02/26/2012  . LYMPHADENOPATHY, DIFFUSE 11/18/2010    Qualifier: Diagnosis of  By: Sundra Aland NP, Malvin Johns       Past Surgical History  Procedure Date  . Lithotripsy 2010  . Appendectomy 2001    via midline incision  . Axillary lymph node dissection 2011; 2012    left    Family History  Problem Relation Age of Onset  . Adopted: Yes    History  Substance Use Topics  . Smoking status: Never Smoker   . Smokeless tobacco: Never Used  . Alcohol Use: 0.0 oz/week     02/25/12 "couple mixed drinks or beers or glasses of wine/month"      Review of Systems  All other systems reviewed and are negative.    Allergies  Codeine and Sulfonamide derivatives  Home Medications  No current outpatient prescriptions on file.  BP 125/57  Pulse 98  Temp 99.2 F (37.3 C) (Oral)  Resp 20  Ht 6' 0.05" (1.83 m)  Wt 256 lb 1.6 oz (116.166 kg)  BMI 34.69 kg/m2  SpO2 97%  Physical Exam  Nursing note and vitals reviewed. Constitutional: He is oriented to person, place, and time. He appears well-developed. No distress.  HENT:  Head: Normocephalic and  atraumatic.  Eyes: Pupils are equal, round, and reactive to light.  Neck: Normal range of motion.  Cardiovascular: Normal rate and intact distal pulses.   Pulmonary/Chest: No respiratory distress.  Abdominal: Normal appearance. He exhibits no distension. There is tenderness.  Musculoskeletal: Normal range of motion.  Neurological: He is alert and oriented to person, place, and time. No cranial nerve deficit.  Skin: Skin is warm and dry. No rash noted. There is pallor.  Psychiatric: He has a normal mood and affect. His behavior is normal.    ED Course  Procedures (including critical care time)  Labs Reviewed  COMPREHENSIVE METABOLIC PANEL - Abnormal; Notable for the following:    Sodium 130 (*)     Glucose, Bld 278 (*)     BUN 32 (*)     Creatinine, Ser 2.35 (*)     Albumin 2.8 (*)     GFR calc non Af Amer 31 (*)     GFR calc Af Amer 35 (*)     All other components within normal limits  CBC WITH DIFFERENTIAL - Abnormal; Notable for the following:    RBC 3.47 (*)     Hemoglobin 10.3 (*)     HCT  30.8 (*)     Platelets 88 (*)  CONSISTENT WITH PREVIOUS RESULT   Monocytes Relative 19 (*)     All other components within normal limits  CBC WITH DIFFERENTIAL - Abnormal; Notable for the following:    WBC 3.8 (*)     RBC 3.21 (*)     Hemoglobin 9.5 (*)     HCT 28.5 (*)     Platelets 93 (*)     Monocytes Relative 20 (*)     All other components within normal limits  BASIC METABOLIC PANEL - Abnormal; Notable for the following:    Sodium 127 (*)     Chloride 94 (*)     Glucose, Bld 367 (*)     BUN 33 (*)     Creatinine, Ser 2.59 (*)     GFR calc non Af Amer 27 (*)     GFR calc Af Amer 32 (*)     All other components within normal limits  URINALYSIS, ROUTINE W REFLEX MICROSCOPIC - Abnormal; Notable for the following:    Color, Urine AMBER (*)  BIOCHEMICALS MAY BE AFFECTED BY COLOR   APPearance TURBID (*)     Hgb urine dipstick SMALL (*)     Bilirubin Urine MODERATE (*)      Ketones, ur 15 (*)     Protein, ur 100 (*)     All other components within normal limits  URINE MICROSCOPIC-ADD ON - Abnormal; Notable for the following:    Squamous Epithelial / LPF FEW (*)     Casts GRANULAR CAST (*)     All other components within normal limits  CBC - Abnormal; Notable for the following:    WBC 3.6 (*)     RBC 3.29 (*)     Hemoglobin 9.8 (*)     HCT 29.2 (*)     Platelets 88 (*)  CONSISTENT WITH PREVIOUS RESULT   All other components within normal limits  BASIC METABOLIC PANEL - Abnormal; Notable for the following:    Sodium 129 (*)     Glucose, Bld 273 (*)     BUN 33 (*)     Creatinine, Ser 2.23 (*)     GFR calc non Af Amer 33 (*)     GFR calc Af Amer 38 (*)     All other components within normal limits  HEMOGLOBIN A1C - Abnormal; Notable for the following:    Hemoglobin A1C 7.1 (*)     Mean Plasma Glucose 157 (*)     All other components within normal limits  T-HELPER CELLS (CD4) COUNT - Abnormal; Notable for the following:    CD4 T Cell Abs 180 (*)     CD4 % Helper T Cell 17 (*)     All other components within normal limits  BASIC METABOLIC PANEL - Abnormal; Notable for the following:    Sodium 128 (*)     Glucose, Bld 243 (*)     BUN 32 (*)     Creatinine, Ser 1.99 (*)     GFR calc non Af Amer 37 (*)     GFR calc Af Amer 43 (*)     All other components within normal limits  IMMUNOFIXATION ELECTROPHORESIS, URINE (WITH TOT PROT) - Abnormal; Notable for the following:    Free Kappa Lt Chains,Ur 292.00 (*)     Free Lambda Lt Chains,Ur 54.30 (*)     All other components within normal limits  MICROALBUMIN / CREATININE URINE RATIO -  Abnormal; Notable for the following:    Microalb, Ur 4.00 (*)     Microalb Creat Ratio 31.4 (*)     All other components within normal limits  KAPPA/LAMBDA LIGHT CHAINS - Abnormal; Notable for the following:    Kappa free light chain 8.93 (*)     Lamda free light chains 12.90 (*)     All other components within normal  limits  IMMUNOFIXATION ELECTROPHORESIS - Abnormal; Notable for the following:    IgG (Immunoglobin G), Serum 2310 (*)     IgM, Serum 51 (*)     All other components within normal limits  MPO/PR-3 (ANCA) ANTIBODIES - Abnormal; Notable for the following:    Serine Protease 3 107 (*)     All other components within normal limits  COMPREHENSIVE METABOLIC PANEL - Abnormal; Notable for the following:    Sodium 133 (*)     Glucose, Bld 287 (*)     Creatinine, Ser 1.49 (*)     Albumin 2.3 (*)     GFR calc non Af Amer 53 (*)     GFR calc Af Amer 61 (*)     All other components within normal limits  CBC WITH DIFFERENTIAL - Abnormal; Notable for the following:    WBC 2.8 (*)     RBC 2.81 (*)     Hemoglobin 8.5 (*)     HCT 25.2 (*)     Platelets 89 (*)  CONSISTENT WITH PREVIOUS RESULT   Neutro Abs 1.2 (*)     Monocytes Relative 21 (*)     All other components within normal limits  CULTURE, BLOOD (ROUTINE X 2)  CULTURE, BLOOD (ROUTINE X 2)  SODIUM, URINE, RANDOM  CREATININE, URINE, RANDOM  MRSA PCR SCREENING  HIV 1 RNA QUANT-NO REFLEX-BLD  TECHNOLOGIST SMEAR REVIEW  ANA  C3 COMPLEMENT  C4 COMPLEMENT  GLOMERULAR BASEMENT MEMBRANE ANTIBODIES  ANTISTREPTOLYSIN O TITER  ANTI-DNA ANTIBODY, DOUBLE-STRANDED  HEPATITIS PANEL, ACUTE  PARVOVIRUS B19 ANTIBODY, IGG AND IGM  PROTEIN ELECTROPHORESIS, SERUM  COMPLEMENT, TOTAL  IMMUNOFIXATION ELECTROPHORESIS, URINE (WITH TOT PROT)  ANCA SCREEN W REFLEX TITER  COMPREHENSIVE METABOLIC PANEL  CBC   US Abdomen Complete  06/07/2012  *RADIOLOGY REPORT*  Clinical Data:  Acute renal failure.  History kidney stones.  HIV with lymphadenopathy, splenomegaly, and fevers.  COMPLETE ABDOMINAL ULTRASOUND  Comparison:  Renal ultrasound 02/23/2012.  CT of the abdomen pelvis without contrast 11/22/2010.  Findings:  Gallbladder:  A 2.7 cm shadowing stone is stable.  There is no evidence for cholecystitis.  The gallbladder wall thickness is within normal limits at 2  mm.  Common bile duct:  Normal in caliber. No biliary ductal dilation.The maximal diameter is 3 mm.  Liver:  No focal lesion identified.  Within normal limits in parenchymal echogenicity.  IVC:  Appears normal.  Pancreas:  Although the pancreas is difficult to visualize in its entirety, no focal pancreatic abnormality is identified.  Spleen:  Spleen is markedly enlarged, measuring at least 15.8 cm. It is difficult to that the entire spleen lung field of view.  Right Kidney:  85 mm nonobstructing calculus at the upper pole of the right kidney is stable.  There is no hydronephrosis.  The right kidney length is 14.4 cm.  Left Kidney:  No hydronephrosis.  Well-preserved cortex.  Normal size and parenchymal echotexture without focal abnormalities. The maximal length is 10.2 cm, stable.  Abdominal aorta:  No aneurysm identified.  IMPRESSION:  1.  Persistent splenomegaly. 2.  2.7 cm shadowing nonobstructing gallstone.  There is no evidence for hydronephrosis. 3.  Stable nonobstructive right upper pole kidney stone.   Original Report Authenticated By: Jamesetta Orleans. MATTERN, M.D.      1. Pancytopenia with fever   2. Acute-on-chronic kidney injury   3. 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 Enlargement of lymph nodes   6. Human immunodeficiency virus (HIV) disease   7. Obesity, unspecified   8. Other and unspecified hyperlipidemia   9. Unspecified essential hypertension   10. Hyponatremia   11. Secondary hyperparathyroidism (of renal origin)       MDM  High risk individual will plan to admit        Nelia Shi, MD 06/09/12 [phone]

## 2012-06-10 ENCOUNTER — Inpatient Hospital Stay (HOSPITAL_COMMUNITY): Payer: Medicaid Other

## 2012-06-10 ENCOUNTER — Encounter (HOSPITAL_COMMUNITY): Payer: Self-pay | Admitting: *Deleted

## 2012-06-10 DIAGNOSIS — R509 Fever, unspecified: Secondary | ICD-10-CM

## 2012-06-10 DIAGNOSIS — D61818 Other pancytopenia: Secondary | ICD-10-CM

## 2012-06-10 DIAGNOSIS — B20 Human immunodeficiency virus [HIV] disease: Secondary | ICD-10-CM

## 2012-06-10 DIAGNOSIS — N179 Acute kidney failure, unspecified: Secondary | ICD-10-CM

## 2012-06-10 LAB — DIRECT ANTIGLOBULIN TEST (NOT AT ARMC)
DAT, IgG: POSITIVE
DAT, complement: POSITIVE

## 2012-06-10 LAB — CBC
HCT: 24.2 % — ABNORMAL LOW (ref 39.0–52.0)
Hemoglobin: 7.9 g/dL — ABNORMAL LOW (ref 13.0–17.0)
MCH: 29.3 pg (ref 26.0–34.0)
MCH: 29.5 pg (ref 26.0–34.0)
MCHC: 32.6 g/dL (ref 30.0–36.0)
MCHC: 32.7 g/dL (ref 30.0–36.0)
MCV: 89.6 fL (ref 78.0–100.0)
MCV: 89.4 fL (ref 78.0–100.0)
Platelets: 110 10*3/uL — ABNORMAL LOW (ref 150–400)
Platelets: 105 10*3/uL — ABNORMAL LOW (ref 150–400)
RBC: 2.7 MIL/uL — ABNORMAL LOW (ref 4.22–5.81)
RDW: 15.1 % (ref 11.5–15.5)
RDW: 15.1 % (ref 11.5–15.5)

## 2012-06-10 LAB — UIFE/LIGHT CHAINS/TP QN, 24-HR UR
Alpha 1, Urine: DETECTED — AB
Alpha 2, Urine: DETECTED — AB
Free Kappa/Lambda Ratio: 5.38 ratio (ref 2.04–10.37)
Total Protein, Urine: 350.5 mg/dL

## 2012-06-10 LAB — COMPREHENSIVE METABOLIC PANEL
ALT: 10 U/L (ref 0–53)
AST: 12 U/L (ref 0–37)
Albumin: 2.1 g/dL — ABNORMAL LOW (ref 3.5–5.2)
CO2: 23 mEq/L (ref 19–32)
Calcium: 8.4 mg/dL (ref 8.4–10.5)
GFR calc non Af Amer: 63 mL/min — ABNORMAL LOW (ref >90)
Sodium: 134 mEq/L — ABNORMAL LOW (ref 135–145)
Total Protein: 6.8 g/dL (ref 6.0–8.3)

## 2012-06-10 LAB — RETICULOCYTES
RBC.: 2.59 MIL/uL — ABNORMAL LOW (ref 4.22–5.81)
Retic Count, Absolute: 13 10*3/uL — ABNORMAL LOW (ref 19.0–186.0)
Retic Ct Pct: 0.5 % (ref 0.4–3.1)

## 2012-06-10 LAB — FERRITIN: Ferritin: 584 ng/mL — ABNORMAL HIGH (ref 22–322)

## 2012-06-10 MED ORDER — HEPARIN SODIUM (PORCINE) 5000 UNIT/ML IJ SOLN
5000.0000 [IU] | Freq: Three times a day (TID) | INTRAMUSCULAR | Status: DC
  Administered 2012-06-11: 5000 [IU] via SUBCUTANEOUS
  Filled 2012-06-10 (×18): qty 1

## 2012-06-10 MED ORDER — FENTANYL CITRATE 0.05 MG/ML IJ SOLN
INTRAMUSCULAR | Status: AC
  Filled 2012-06-10: qty 4

## 2012-06-10 MED ORDER — ACETAMINOPHEN 325 MG PO TABS
650.0000 mg | ORAL_TABLET | Freq: Every evening | ORAL | Status: DC
  Administered 2012-06-10 – 2012-06-14 (×7): 650 mg via ORAL
  Filled 2012-06-10 (×4): qty 2

## 2012-06-10 MED ORDER — MIDAZOLAM HCL 2 MG/2ML IJ SOLN
INTRAMUSCULAR | Status: AC
  Filled 2012-06-10: qty 4

## 2012-06-10 MED ORDER — BENZONATATE 100 MG PO CAPS
100.0000 mg | ORAL_CAPSULE | Freq: Three times a day (TID) | ORAL | Status: DC | PRN
  Administered 2012-06-10 – 2012-06-12 (×3): 100 mg via ORAL
  Filled 2012-06-10 (×4): qty 1

## 2012-06-10 MED ORDER — MIDAZOLAM HCL 5 MG/5ML IJ SOLN
INTRAMUSCULAR | Status: AC | PRN
  Administered 2012-06-10: 2 mg via INTRAVENOUS

## 2012-06-10 MED ORDER — FENTANYL CITRATE 0.05 MG/ML IJ SOLN
INTRAMUSCULAR | Status: AC | PRN
  Administered 2012-06-10: 50 ug via INTRAVENOUS

## 2012-06-10 NOTE — Consult Note (Signed)
INFECTIOUS DISEASE CONSULT NOTE  Date of Admission:  06/05/2012  Date of Consult:  06/10/2012  Reason for Consult:HIV+, CRI Referring Physician: Lonzo [name]  Impression/Recommendation HIV+ CRI Would-  Check his [id number]-5701 Check Integrase inhibitor susceptibility test  Comment- considerations include vasculitis, ARV related toxicity, and IRIS. He has taken trial of steroids previously with these episodes with good relief. This is inconclusive. Possibly changing his meds away from tenofovir and away from sustiva will could improve his side ffects (if this is ART related)? Will send test to try and facilitate this but will hold ART and defer changing his ART to his HIV PCP.   Thank you so much for this interesting consult,   Patrick [name] [phone]  Patrick [name] is an 51 y.o. male.  HPI: 51 yo M with HIV+ since 2010, last CD4 370 and VL <20 (05-05-12) and CRI (1.4-1.6) comes to hospital on 9-9 with fever and lymphadenopathy since February 2013. He has 5 admissions for these episodes since 2012 (including 4 this year). These are felt to be due to his ART (he is currently taking atripla,  has prev been on other ART- TRV/NRV/X?). He has previously had LN Bx and Bone Marrow Bx all negative.  His Cr in hospital has gone from 2.59 now down to 1.59 with hydration and holding in his atripla. His CD4 and VL have been repeated 9-9 now 180 and <20.  Some concern that his ARF is drug induced as it has recurred with each d/c onto tenofovir. He is scheduled for renal Bx to r/o vasculitis.  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    Past Medical History  Diagnosis Date  . Hypertension   . HIV (human immunodeficiency virus infection) 2010  . Hypogonadism male   . CKD (chronic kidney disease) stage 3, GFR 30-59 ml/min     hydrated cr 1.5 gfr 48  . Kidney stones     "quite a few times"  . High cholesterol   . Heart murmur   . Exertional dyspnea 02/25/12    "and lying down"  . Anemia   . Blood  transfusion   . Hernia     "surgical; where appendix ruptured"  . Pancytopenia with fever 02/26/2012  . LYMPHADENOPATHY, DIFFUSE 11/18/2010    Qualifier: Diagnosis of  By: Sundra Aland NP, Malvin Johns       Past Surgical History  Procedure Date  . Lithotripsy 2010  . Appendectomy 2001    via midline incision  . Axillary lymph node dissection 2011; 2012    left     Allergies  Allergen Reactions  . Codeine Itching, Rash and Other (See Comments)    "crazy dreams"  . Sulfonamide Derivatives Rash    Medications:  Scheduled:   . acetaminophen  650 mg Oral QPM  . cyanocobalamin  1,000 mcg Intramuscular Q7 days  . fentaNYL      . ferrous sulfate  325 mg Oral TID WC  . heparin  5,000 Units Subcutaneous Q8H  . influenza  inactive virus vaccine  0.5 mL Intramuscular Tomorrow-1000  . midazolam      . DISCONTD: heparin  5,000 Units Subcutaneous Q8H    Total days of antibiotics 0  Social History:  reports that he has never smoked. He has never used smokeless tobacco. He reports that he drinks alcohol. He reports that he does not use illicit drugs.  Family History  Problem Relation Age of Onset  . Adopted: Yes    General ROS: no  dysuria, hematuria; prev kidney stone on R; normal BM; eating well until episodes; wt has been variable by 10-15#; has occs headaches with his fevers; no change in his vision; paresthesias in L hand and foot;   Blood pressure 129/68, pulse 99, temperature 99.2 F (37.3 C), temperature source Oral, resp. rate 20, height 6' 0.05" (1.83 m), weight 110.678 kg (244 lb), SpO2 100.00%. General appearance: alert, cooperative and no distress Eyes: negative findings: conjunctivae and sclerae normal and pupils equal, round, reactive to light and accomodation Throat: normal findings: oropharynx pink & moist without lesions or evidence of thrush and abnormal findings: dentition: poor Neck: no adenopathy Lungs: clear to auscultation bilaterally Heart: regular rate and  rhythm Abdomen: normal findings: bowel sounds normal and soft, non-tender Extremities: edema none   Results for orders placed during the hospital encounter of 06/05/12 (from the past 48 hour(s))  COMPREHENSIVE METABOLIC PANEL     Status: Abnormal   Collection Time   06/09/12  6:15 AM      Component Value Range Comment   Sodium 135  135 - 145 mEq/L    Potassium 4.0  3.5 - 5.1 mEq/L    Chloride 105  96 - 112 mEq/L    CO2 21  19 - 32 mEq/L    Glucose, Bld 218 (*) 70 - 99 mg/dL    BUN 15  6 - 23 mg/dL    Creatinine, Ser 4.54  0.50 - 1.35 mg/dL    Calcium 8.4  8.4 - 09.8 mg/dL    Total Protein 6.7  6.0 - 8.3 g/dL    Albumin 2.1 (*) 3.5 - 5.2 g/dL    AST 14  0 - 37 U/L    ALT 11  0 - 53 U/L    Alkaline Phosphatase 42  39 - 117 U/L    Total Bilirubin 0.2 (*) 0.3 - 1.2 mg/dL    GFR calc non Af Amer 64 (*) >90 mL/min    GFR calc Af Amer 74 (*) >90 mL/min   CBC     Status: Abnormal   Collection Time   06/09/12  6:15 AM      Component Value Range Comment   WBC 2.8 (*) 4.0 - 10.5 K/uL    RBC 2.73 (*) 4.22 - 5.81 MIL/uL    Hemoglobin 8.0 (*) 13.0 - 17.0 g/dL    HCT 11.9 (*) 14.7 - 52.0 %    MCV 90.1  78.0 - 100.0 fL    MCH 29.3  26.0 - 34.0 pg    MCHC 32.5  30.0 - 36.0 g/dL    RDW 82.9  56.2 - 13.0 %    Platelets 96 (*) 150 - 400 K/uL CONSISTENT WITH PREVIOUS RESULT  COMPREHENSIVE METABOLIC PANEL     Status: Abnormal   Collection Time   06/10/12  5:15 AM      Component Value Range Comment   Sodium 134 (*) 135 - 145 mEq/L    Potassium 3.9  3.5 - 5.1 mEq/L    Chloride 103  96 - 112 mEq/L    CO2 23  19 - 32 mEq/L    Glucose, Bld 180 (*) 70 - 99 mg/dL    BUN 13  6 - 23 mg/dL    Creatinine, Ser 8.65  0.50 - 1.35 mg/dL    Calcium 8.4  8.4 - 78.4 mg/dL    Total Protein 6.8  6.0 - 8.3 g/dL    Albumin 2.1 (*) 3.5 - 5.2 g/dL  AST 12  0 - 37 U/L    ALT 10  0 - 53 U/L    Alkaline Phosphatase 43  39 - 117 U/L    Total Bilirubin 0.2 (*) 0.3 - 1.2 mg/dL    GFR calc non Af Amer 63 (*) >90  mL/min    GFR calc Af Amer 73 (*) >90 mL/min   CBC     Status: Abnormal   Collection Time   06/10/12  5:15 AM      Component Value Range Comment   WBC 3.0 (*) 4.0 - 10.5 K/uL    RBC 2.70 (*) 4.22 - 5.81 MIL/uL    Hemoglobin 7.9 (*) 13.0 - 17.0 g/dL    HCT 21.3 (*) 08.6 - 52.0 %    MCV 89.6  78.0 - 100.0 fL    MCH 29.3  26.0 - 34.0 pg    MCHC 32.6  30.0 - 36.0 g/dL    RDW 57.8  46.9 - 62.9 %    Platelets 102 (*) 150 - 400 K/uL CONSISTENT WITH PREVIOUS RESULT  PROTIME-INR     Status: Abnormal   Collection Time   06/10/12  9:03 AM      Component Value Range Comment   Prothrombin Time 15.6 (*) 11.6 - 15.2 seconds    INR 1.21  0.00 - 1.49   LACTATE DEHYDROGENASE     Status: Normal   Collection Time   06/10/12 12:45 PM      Component Value Range Comment   LDH 110  94 - 250 U/L   DIRECT ANTIGLOBULIN TEST     Status: Normal   Collection Time   06/10/12 12:45 PM      Component Value Range Comment   DAT, complement POS      DAT, IgG POS     RETICULOCYTES     Status: Abnormal   Collection Time   06/10/12 12:45 PM      Component Value Range Comment   Retic Ct Pct 0.5  0.4 - 3.1 %    RBC. 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  HIV.  CT MAXILLOFACIAL WITHOUT CONTRAST  Technique:  Multidetector CT imaging of the maxillofacial structures was performed. Multiplanar CT image reconstructions were also generated.  Comparison: None.  Findings: Visualized noncontrast brain parenchyma is within normal limits.  Calcified atherosclerosis at the skull base.  Widespread upper cervical lymphadenopathy partially visible.  Among the largest visible nodes are of left level be, 20 mm short axis.  Tonsillar and adenoid hypertrophy also noted and may be related to the lymphoproliferative disorder.  Visualized orbit soft tissues are within normal limits.  Visualized tympanic cavities and mastoids are clear.  Minimal sphenoid sinus mucosal thickening. Mild to moderate ethmoid sinus mucosal thickening. Frontal sinuses are clear. Moderate bilateral maxillary sinus mucosal thickening which is mostly dependent and at least in part related to mucous retention cyst. Mucosal thickening at both [facility]  greater on the left.  The nasal septum is intact.  No osseous erosions.  Negative nasal cavity. No acute osseous abnormality identified.  IMPRESSION: 1.  Mild to moderate conventional appearing ethmoid and maxillary sinus mucosal thickening.  No imaging features of Wegener's granulomatosis identified. 2.  Cervical lymphadenopathy partially visible.  Adenoid and tonsillar hypertrophy.  Favor lymphoproliferative disorder related to HIV, but lymphoma / leukemia not excluded.   Original Report Authenticated By: Harley Hallmark, M.D.    Recent Results (from the past 240 hour(s))  CULTURE, BLOOD (ROUTINE X 2)     Status: Normal (Preliminary result)   Collection Time   06/05/12 10:10 PM      Component Value Range Status Comment   Specimen Description BLOOD RIGHT ARM   Final    Special Requests BOTTLES DRAWN AEROBIC AND ANAEROBIC 10CC EACH   Final    Culture  Setup Time 06/06/2012 08:11   Final    Culture     Final    Value:        BLOOD CULTURE RECEIVED NO GROWTH TO DATE CULTURE WILL BE HELD FOR 5 DAYS BEFORE ISSUING A FINAL NEGATIVE REPORT   Report Status PENDING   Incomplete   CULTURE, BLOOD (ROUTINE X 2)     Status: Normal (Preliminary result)   Collection Time   06/05/12 10:20 PM      Component Value Range Status Comment   Specimen  Description BLOOD LEFT ARM   Final    Special Requests BOTTLES DRAWN AEROBIC AND ANAEROBIC 10CC EACH   Final    Culture  Setup Time 06/06/2012 08:12   Final    Culture     Final    Value:        BLOOD CULTURE RECEIVED NO GROWTH TO DATE CULTURE WILL BE HELD FOR 5 DAYS BEFORE ISSUING A FINAL NEGATIVE REPORT   Report Status PENDING   Incomplete   MRSA PCR SCREENING     Status: Normal   Collection Time   06/06/12  1:05 AM      Component Value Range Status Comment   MRSA by PCR NEGATIVE  NEGATIVE Final       06/10/2012, 4:45 PM     LOS: 5 days

## 2012-06-10 NOTE — H&P (Signed)
Patrick [name] is an 51 y.o. male.   Chief Complaint: renal failure; +ANCA-PR3 Thrombocytopenia (plts 102 today) Scheduled for renal biopsy today HPI: HIV; HTN; CKD; HLD; pancytopenia; LAN  Past Medical History  Diagnosis Date  . Hypertension   . HIV (human immunodeficiency virus infection) 2010  . Hypogonadism male   . CKD (chronic kidney disease) stage 3, GFR 30-59 ml/min     hydrated cr 1.5 gfr 48  . Kidney stones     "quite a few times"  . High cholesterol   . Heart murmur   . Exertional dyspnea 02/25/12    "and lying down"  . Anemia   . Blood transfusion   . Hernia     "surgical; where appendix ruptured"  . Pancytopenia with fever 02/26/2012  . LYMPHADENOPATHY, DIFFUSE 11/18/2010    Qualifier: Diagnosis of  By: Sundra Aland NP, Malvin Johns       Past Surgical History  Procedure Date  . Lithotripsy 2010  . Appendectomy 2001    via midline incision  . Axillary lymph node dissection 2011; 2012    left    Family History  Problem Relation Age of Onset  . Adopted: Yes   Social History:  reports that he has never smoked. He has never used smokeless tobacco. He reports that he drinks alcohol. He reports that he does not use illicit drugs.  Allergies:  Allergies  Allergen Reactions  . Codeine Itching, Rash and Other (See Comments)    "crazy dreams"  . Sulfonamide Derivatives Rash    Medications Prior to Admission  Medication Sig Dispense Refill  . cyanocobalamin (,VITAMIN B-12,) 1000 MCG/ML injection 1,000 mcg every 7 (seven) days. Takes on Monday      . dapsone 100 MG tablet Take 100 mg by mouth every morning.      Marland Kitchen efavirenz-emtrictabine-tenofovir (ATRIPLA) 600-200-300 MG per tablet Take 1 tablet by mouth at bedtime.  30 tablet  5  . ferrous sulfate 325 (65 FE) MG tablet Take 1 tablet (325 mg total) by mouth 3 (three) times daily with meals.  90 tablet  5  . labetalol (NORMODYNE) 200 MG tablet Take 200 mg by mouth 2 (two) times daily.        Results for orders  placed during the hospital encounter of 06/05/12 (from the past 48 hour(s))  CBC WITH DIFFERENTIAL     Status: Abnormal   Collection Time   06/08/12  3:48 PM      Component Value Range Comment   WBC 2.8 (*) 4.0 - 10.5 K/uL    RBC 2.81 (*) 4.22 - 5.81 MIL/uL    Hemoglobin 8.5 (*) 13.0 - 17.0 g/dL    HCT 08.6 (*) 57.8 - 52.0 %    MCV 89.7  78.0 - 100.0 fL    MCH 30.2  26.0 - 34.0 pg    MCHC 33.7  30.0 - 36.0 g/dL    RDW 46.9  62.9 - 52.8 %    Platelets 89 (*) 150 - 400 K/uL CONSISTENT WITH PREVIOUS RESULT   Neutrophils Relative 43  43 - 77 %    Neutro Abs 1.2 (*) 1.7 - 7.7 K/uL    Lymphocytes Relative 33  12 - 46 %    Lymphs Abs 0.9  0.7 - 4.0 K/uL    Monocytes Relative 21 (*) 3 - 12 %    Monocytes Absolute 0.6  0.1 - 1.0 K/uL    Eosinophils Relative 3  0 - 5 %    Eosinophils  Absolute 0.1  0.0 - 0.7 K/uL    Basophils Relative 0  0 - 1 %    Basophils Absolute 0.0  0.0 - 0.1 K/uL   COMPREHENSIVE METABOLIC PANEL     Status: Abnormal   Collection Time   06/09/12  6:15 AM      Component Value Range Comment   Sodium 135  135 - 145 mEq/L    Potassium 4.0  3.5 - 5.1 mEq/L    Chloride 105  96 - 112 mEq/L    CO2 21  19 - 32 mEq/L    Glucose, Bld 218 (*) 70 - 99 mg/dL    BUN 15  6 - 23 mg/dL    Creatinine, Ser 1.61  0.50 - 1.35 mg/dL    Calcium 8.4  8.4 - 09.6 mg/dL    Total Protein 6.7  6.0 - 8.3 g/dL    Albumin 2.1 (*) 3.5 - 5.2 g/dL    AST 14  0 - 37 U/L    ALT 11  0 - 53 U/L    Alkaline Phosphatase 42  39 - 117 U/L    Total Bilirubin 0.2 (*) 0.3 - 1.2 mg/dL    GFR calc non Af Amer 64 (*) >90 mL/min    GFR calc Af Amer 74 (*) >90 mL/min   CBC     Status: Abnormal   Collection Time   06/09/12  6:15 AM      Component Value Range Comment   WBC 2.8 (*) 4.0 - 10.5 K/uL    RBC 2.73 (*) 4.22 - 5.81 MIL/uL    Hemoglobin 8.0 (*) 13.0 - 17.0 g/dL    HCT 04.5 (*) 40.9 - 52.0 %    MCV 90.1  78.0 - 100.0 fL    MCH 29.3  26.0 - 34.0 pg    MCHC 32.5  30.0 - 36.0 g/dL    RDW 81.1  91.4 -  78.2 %    Platelets 96 (*) 150 - 400 K/uL CONSISTENT WITH PREVIOUS RESULT  COMPREHENSIVE METABOLIC PANEL     Status: Abnormal   Collection Time   06/10/12  5:15 AM      Component Value Range Comment   Sodium 134 (*) 135 - 145 mEq/L    Potassium 3.9  3.5 - 5.1 mEq/L    Chloride 103  96 - 112 mEq/L    CO2 23  19 - 32 mEq/L    Glucose, Bld 180 (*) 70 - 99 mg/dL    BUN 13  6 - 23 mg/dL    Creatinine, Ser 9.56  0.50 - 1.35 mg/dL    Calcium 8.4  8.4 - 21.3 mg/dL    Total Protein 6.8  6.0 - 8.3 g/dL    Albumin 2.1 (*) 3.5 - 5.2 g/dL    AST 12  0 - 37 U/L    ALT 10  0 - 53 U/L    Alkaline Phosphatase 43  39 - 117 U/L    Total Bilirubin 0.2 (*) 0.3 - 1.2 mg/dL    GFR calc non Af Amer 63 (*) >90 mL/min    GFR calc Af Amer 73 (*) >90 mL/min   CBC     Status: Abnormal   Collection Time   06/10/12  5:15 AM      Component Value Range Comment   WBC 3.0 (*) 4.0 - 10.5 K/uL    RBC 2.70 (*) 4.22 - 5.81 MIL/uL    Hemoglobin 7.9 (*) 13.0 - 17.0  g/dL    HCT 16.1 (*) 09.6 - 52.0 %    MCV 89.6  78.0 - 100.0 fL    MCH 29.3  26.0 - 34.0 pg    MCHC 32.6  30.0 - 36.0 g/dL    RDW 04.5  40.9 - 81.1 %    Platelets 102 (*) 150 - 400 K/uL CONSISTENT WITH PREVIOUS RESULT  PROTIME-INR     Status: Abnormal   Collection Time   06/10/12  9:03 AM      Component Value Range Comment   Prothrombin Time 15.6 (*) 11.6 - 15.2 seconds    INR 1.21  0.00 - 1.49    Ct Maxillofacial Wo Cm  06/09/2012  *RADIOLOGY REPORT*  Clinical Data: 51 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  HIV.  CT MAXILLOFACIAL WITHOUT CONTRAST  Technique:  Multidetector CT imaging of the maxillofacial structures was performed. Multiplanar CT image reconstructions were also generated.  Comparison: None.  Findings: Visualized noncontrast brain parenchyma is within normal limits. Calcified atherosclerosis at the skull base.  Widespread upper cervical lymphadenopathy partially visible.  Among the largest visible nodes are of left level be,  20 mm short axis.  Tonsillar and adenoid hypertrophy also noted and may be related to the lymphoproliferative disorder.  Visualized orbit soft tissues are within normal limits.  Visualized tympanic cavities and mastoids are clear.  Minimal sphenoid sinus mucosal thickening. Mild to moderate ethmoid sinus mucosal thickening. Frontal sinuses are clear. Moderate bilateral maxillary sinus mucosal thickening which is mostly dependent and at least in part related to mucous retention cyst. Mucosal thickening at both [facility] greater on the left.  The nasal septum is intact.  No osseous erosions.  Negative nasal cavity. No acute osseous abnormality identified.  IMPRESSION: 1.  Mild to moderate conventional appearing ethmoid and maxillary sinus mucosal thickening.  No imaging features of Wegener's granulomatosis identified. 2.  Cervical lymphadenopathy partially visible.  Adenoid and tonsillar hypertrophy.  Favor lymphoproliferative disorder related to HIV, but lymphoma / leukemia not excluded.   Original Report Authenticated By: Harley Hallmark, M.D.     Review of Systems  Constitutional: Negative for fever and weight loss.  Respiratory: Negative for shortness of breath.   Cardiovascular: Negative for chest pain.  Gastrointestinal: Negative for nausea, vomiting and abdominal pain.  Neurological: Positive for weakness. Negative for headaches.    Blood pressure 124/67, pulse 101, temperature 98.3 F (36.8 C), temperature source Oral, resp. rate 20, height 6' 0.05" (1.83 m), weight 244 lb (110.678 kg), SpO2 97.00%. Physical Exam  Constitutional: He is oriented to person, place, and time. He appears well-developed and well-nourished.  Cardiovascular: Normal rate, regular rhythm and normal heart sounds.   No murmur heard. Respiratory: Effort normal and breath sounds normal. He has no wheezes.  GI: Soft. Bowel sounds are normal. There is no tenderness.  Musculoskeletal: Normal range of motion.  Neurological: He is  alert and oriented to person, place, and time.  Psychiatric: He has a normal mood and affect. His behavior is normal. Judgment and thought content normal.     Assessment/Plan Acute on Chronic renal failure Scheduled for random renal bx in IR Pt aware of procedure benefits and risks and agreeable to proceed. Consent signed and in chart plts 102 today  Sabryna Lahm A 06/10/2012, 12:01 PM

## 2012-06-10 NOTE — Progress Notes (Signed)
Subjective:    Interval Events:  He is still spiking fevers but overall feels better since admission.  No new complaints. He is undergoing renal biopsy today.     Objective:    Vital Signs:   Temp:  [98.1 F (36.7 C)-102.7 F (39.3 C)] 98.3 F (36.8 C) (09/13 0955) Pulse Rate:  [86-112] 101  (09/13 0955) Resp:  [17-20] 20  (09/13 0955) BP: (118-136)/(65-79) 124/67 mmHg (09/13 0955) SpO2:  [93 %-100 %] 97 % (09/13 0955) Weight:  [244 lb (110.678 kg)] 244 lb (110.678 kg) (09/12 2211) Last BM Date: 06/09/12   Weights: 24-hour Weight change: -12 lb 1.6 oz (-5.489 kg)  Filed Weights   06/07/12 2229 06/08/12 2100 06/09/12 2211  Weight: 239 lb 6.4 oz (108.591 kg) 256 lb 1.6 oz (116.166 kg) 244 lb (110.678 kg)     Intake/Output:   Intake/Output Summary (Last 24 hours) at 06/10/12 1215 Last data filed at 06/10/12 0900  Gross per 24 hour  Intake   2020 ml  Output      0 ml  Net   2020 ml       Physical Exam: General appearance: alert, no distress and morbidly obese Head: Normocephalic, without obvious abnormality, atraumatic Cardio: regular rate and rhythm, S1, S2 normal, no murmur, click, rub or gallop GI: soft, non-tender; bowel sounds normal; no masses,  no organomegaly Extremities: extremities normal, atraumatic, no cyanosis or edema Lymph nodes: Cervical adenopathy: present bilaterally and Axillary adenopathy: present bilaterally  Neurologic: Grossly normal    Labs: Basic Metabolic Panel:  Lab 06/10/12 1027 06/09/12 0615 06/08/12 1014 06/06/12 1630 06/06/12 0640  NA 134* 135 133* 128* 129*  K 3.9 4.0 4.0 4.3 4.0  CL 103 105 103 98 96  CO2 23 21 22 22 22   GLUCOSE 180* 218* 287* 243* 273*  BUN 13 15 18  32* 33*  CREATININE 1.29 1.28 1.49* 1.99* 2.23*  CALCIUM 8.4 8.4 8.4 -- --  MG -- -- -- -- --  PHOS -- -- -- -- --    Liver Function Tests:  Lab 06/10/12 0515 06/09/12 0615 06/08/12 1014 06/05/12 2103  AST 12 14 14 14   ALT 10 11 10 13   ALKPHOS 43  42 47 61  BILITOT 0.2* 0.2* 0.3 0.4  PROT 6.8 6.7 7.0 7.8  ALBUMIN 2.1* 2.1* 2.3* 2.8*   No results found for this basename: LIPASE:5,AMYLASE:5 in the last 168 hours No results found for this basename: AMMONIA:3 in the last 168 hours  CBC:  Lab 06/10/12 0515 06/09/12 0615 06/08/12 1548 06/06/12 0640 06/05/12 2219 06/05/12 2103  WBC 3.0* 2.8* 2.8* 3.6* 3.8* --  NEUTROABS -- -- 1.2* -- 1.9 1.9  HGB 7.9* 8.0* 8.5* 9.8* 9.5* --  HCT 24.2* 24.6* 25.2* 29.2* 28.5* --  MCV 89.6 90.1 89.7 88.8 88.8 --  PLT 102* 96* 89* 88* 93* --    Cardiac Enzymes: No results found for this basename: CKTOTAL:5,CKMB:5,CKMBINDEX:5,TROPONINI:5 in the last 168 hours  BNP: No components found with this basename: POCBNP:5  CBG: No results found for this basename: GLUCAP:5 in the last 168 hours  Coagulation Studies:  Basename 06/10/12 0903  LABPROT 15.6*  INR 1.21    Microbiology: Results for orders placed during the hospital encounter of 06/05/12  CULTURE, BLOOD (ROUTINE X 2)     Status: Normal (Preliminary result)   Collection Time   06/05/12 10:10 PM      Component Value Range Status Comment   Specimen Description BLOOD RIGHT ARM  Final    Special Requests BOTTLES DRAWN AEROBIC AND ANAEROBIC 10CC EACH   Final    Culture  Setup Time 06/06/2012 08:11   Final    Culture     Final    Value:        BLOOD CULTURE RECEIVED NO GROWTH TO DATE CULTURE WILL BE HELD FOR 5 DAYS BEFORE ISSUING A FINAL NEGATIVE REPORT   Report Status PENDING   Incomplete   CULTURE, BLOOD (ROUTINE X 2)     Status: Normal (Preliminary result)   Collection Time   06/05/12 10:20 PM      Component Value Range Status Comment   Specimen Description BLOOD LEFT ARM   Final    Special Requests BOTTLES DRAWN AEROBIC AND ANAEROBIC 10CC EACH   Final    Culture  Setup Time 06/06/2012 08:12   Final    Culture     Final    Value:        BLOOD CULTURE RECEIVED NO GROWTH TO DATE CULTURE WILL BE HELD FOR 5 DAYS BEFORE ISSUING A FINAL NEGATIVE  REPORT   Report Status PENDING   Incomplete   MRSA PCR SCREENING     Status: Normal   Collection Time   06/06/12  1:05 AM      Component Value Range Status Comment   MRSA by PCR NEGATIVE  NEGATIVE Final     Other results:   Imaging: Ct Maxillofacial Wo Cm  06/09/2012  *RADIOLOGY REPORT*  Clinical Data: 51 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  HIV.  CT MAXILLOFACIAL WITHOUT CONTRAST  Technique:  Multidetector CT imaging of the maxillofacial structures was performed. Multiplanar CT image reconstructions were also generated.  Comparison: None.  Findings: Visualized noncontrast brain parenchyma is within normal limits. Calcified atherosclerosis at the skull base.  Widespread upper cervical lymphadenopathy partially visible.  Among the largest visible nodes are of left level be, 20 mm short axis.  Tonsillar and adenoid hypertrophy also noted and may be related to the lymphoproliferative disorder.  Visualized orbit soft tissues are within normal limits.  Visualized tympanic cavities and mastoids are clear.  Minimal sphenoid sinus mucosal thickening. Mild to moderate ethmoid sinus mucosal thickening. Frontal sinuses are clear. Moderate bilateral maxillary sinus mucosal thickening which is mostly dependent and at least in part related to mucous retention cyst. Mucosal thickening at both [facility] greater on the left.  The nasal septum is intact.  No osseous erosions.  Negative nasal cavity. No acute osseous abnormality identified.  IMPRESSION: 1.  Mild to moderate conventional appearing ethmoid and maxillary sinus mucosal thickening.  No imaging features of Wegener's granulomatosis identified. 2.  Cervical lymphadenopathy partially visible.  Adenoid and tonsillar hypertrophy.  Favor lymphoproliferative disorder related to HIV, but lymphoma / leukemia not excluded.   Original Report Authenticated By: Harley Hallmark, M.D.       Medications:    Infusions:    . sodium chloride 125 mL/hr at  06/10/12 5409     Scheduled Medications:    . cyanocobalamin  1,000 mcg Intramuscular Q7 days  . ferrous sulfate  325 mg Oral TID WC  . heparin  5,000 Units Subcutaneous Q8H  . influenza  inactive virus vaccine  0.5 mL Intramuscular Tomorrow-1000  . DISCONTD: heparin  5,000 Units Subcutaneous Q8H     PRN Medications: acetaminophen, acetaminophen, ondansetron (ZOFRAN) IV, ondansetron   Assessment/ Plan:   Mr. Ringenberg is a 51 year old male with a history of HIV (most recent CD4 count 370 on 05/05/12, VL  undetectable), renal insufficiency with baseline creatinine~1.5, hypertension, recurrent lymphadenopathy and fever (since February 2012) thought to be benign reactive changes related to HIV. He presents to the ED with fever and lymphadenopathy since Tuesday. He has been admitted with similar symptoms in the past.   Fever and lymphadenopathy: Recurrent episodes with similar presentation. Likely seventh episode in last 1.5 years- with possible 5th admission. 2 axillary lymph is very is in an node biopsy and bone marrow biopsy unrevealing for malignancy or infections. left axillary node bx 11/24/11: Benign lymph node tissue with regressively transformed germinal centers, prominent vascularity and extensive plasmacytosis, architecture generally preserved. Bone marrow Bx 02/24/12: Mild absolute neutropenia, increased bands and toxic granulation. Moderate normocytic, normochromic anemia, no schistocytes, moderate thrombocytopenia. Patient with chronic splenomegaly. Likely HIV related lymphadenopathy.  Has been followed by oncologist Dr. Gaylyn Rong while in hospital and in the clinic. Autoimmune cause has been considered and ANA - negative. He was recently been seen by rheumatologist- Dr. Nickola Major- and has a followup appointment with her on coming Friday. So for all admissions and workup has been unrevealing- no known cause of lymphadenopathy and the general feeling is that these symptoms are related to HIV as  opposed to cancer like leukemia or MDS. His bone marrow's cytogenetics were negative. There has been an extended discussion regarding the cause of his fever episodes including infections, iatrogenic (Atripla can cause fevers) autoimmune disorders,malignancies and genetic disorders. Mr Alberta has no history of recent travel outside [place]. He was adopted as a child and therefore he does not know his family history since the adoption was of 'closed' type. At this time the main interest is vasculitis Vs a paraprotein syndrome like monogammopathy of undetermined significancy. Vasculitis is being considered given recurrent AKI whenever he gets flares of his condition together with elevated CRP and ESR.  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 IgG = 2318, Serum free Kappa = 8.9, free lambda 12.9, with kappa/lambda ration - normal.  Urine free kappa 292, and free lambda = 5.38. IgA and IgM are normal. Immunofixation results are pending.  Serine protease is elevated at 107. Wil discuss with the team about consulting hemo/onco. I discussed with Dr Nickola Major (rheumatology) about the new results and she feels we should proceed with renal biopsy to provide tissue diagnosis.  Plan  -  Urine Electrophoresis shows increased levels of the free kappa and lambda light chains in both serum and urine . Serum IgG levels are also elevated. Immunofixation - No monoclonal free light chains (Bence Jones Protein) are detected. Urine IFE shows polyclonal increase in free Kappa and/or free Lambda light chains. -Follow up blood cultures x2 - no growth  - Tylenol when necessary for fever and pain  Acute on chronic kidney disease : Baseline creatinine about 1.4-1.6.  Creatinine 1.28<1.49<1.99<2.59<1.2<1.63<1.59. 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 Also had pyelonephritis in past. Patient does not  seem to have UTI at present. Has granular casts in urine though. Patient hydrating himself at home with Gatorade but still c/o dark yellow urine. Unclear about the cause- but reflective of recent acute process ( as patient has worsening renal function with fever and lymphadenopathy in past). His history is significant for a renal stones. He has followed up with Dr Hyman Hopes but no medical records at this time. The main concern, again is a possibility of a vasculitis going together with is symptoms.  Renal ultrasoung -1. Persistent splenomegaly. 2. 2.7 cm shadowing nonobstructing gallstone. There is no evidence for hydronephrosis. 3. Stable nonobstructive right upper pole kidney stone. His renal function has continued to improve with a creatinine of 1.28<1.99 today.  Plan  -Consult nephrology - platelets today are better 102. To proceed with renal biopsy. - continue with IVF and hold Atripla.  -monitor urine outpt  - FENa is 0.2 (<1%) which suggest prerenal cause of his acute renal insufficiency. This is likely from dehydration as he reports doing a lot of yard work on Tuesday which is which is when the symptoms started.  - ANA, C3 and C4 complement level, antiglomerular basement membrane antibodies, antistreptolysin O titers are all negative -Noted kappa free light chains free light chains and lambda free light chains. This might explain renal insuffiency with with light chain deposition.      Acute Pancytopenia - WBC 3.6<3.8< 6.4 , platelets 88<93 < 236 .  This has been an issue during previous admissions. History not suggestive of GI or other source of blood loss.  Likely related to acute process. Been followed by hematologist. Bone marrow biopsy and lymph node biopsies unrevealing as in #1. Last ferritin level 685 in May 2013. In June 2013, cultures from bone marrow aspirate was still negative for AFB, fungal, viral. His bone marrow's cytogenetics were negative. Therefore his low blood counts and fever  were thought to be most likely reactive. There was no concern for primary bone marrow disease such as leukemia, lymphoma, MDS. Dr Jethro Bolus (oncologist) recommended that he follows up with his primary doctor and HIV  He was seen by Dr Nickola Major, a rheumatologist who orders several tests including ESR 55, C-reactive protein 8.5, HLA-B27 negative, CCP antibodies, IgG - negative, rheumatoid factor less than 13, ANA negative, C3 complement 147, C4 compliment 27, uric acid 7.0, and ANCA panel -all Negative. TSH 3.85, testosterone 2.77. X-ray of hand to rule osteoarthritis - normal. He has a followup appointment on 06/10/2012.  Plan:  -We will monitor CBC. Mr Chura has had similar pancytopenia with his last admissions and test to purse cause has not revealed any cause other than possible HIV related.  - Low hemo - there is a dropped by 2 points over his duration of hospitalisation - 4 days. I don't suspect acute hemorrhage but I think this might be due to the acute pancytopenia which bone marrow suppression.    Anemia:  This could be related to the pancytopenia or hematemesis.  Note decreasing hemoglobin 7.9<8.0<9.5<12.1. Will do FOBT, LDH, Haptoglobulin, Coombs test, and reticulocytes count.  HIV- compliant with Atripla. Dapsone recently stopped by Dr. Orvan Falconer. Recent CD4 count 180<370< 210<110. VL remains <20. My only concern today is the issue to Atripla given Mr Caddock's changes in renal function and history of renal stones.  Plan  -Will hold his atripla for now until rehydrated.  - Discussed with Dr Ninetta Lights. We will evaluate him regarding HAART options   Hyponatremia - resolved: has been in low 130s over past few months, further decrease likely secondary to some volume depletion and hyperglycemia. Systolic blood pressure on lower side. He was not orthostatic. I suspect dehydration.  Sodium 127 - corrected sodium - 131-2  Plan  -will monitor  HTN- systolic blood pressure on lower side.  Plan  -  Hold Labetolol- restart as tolerated.  DM 2 : Hyperglycemia on admission-368 in setting of acute process  Last A1c 6.6 in May 2013 and repeat A1c shows 7.1% with elevated  serum glucose.  Marland Kitchen He meets the diagnostic criteria for Diabetes. I have discussed with him concerning treatment and life style changes for diabetes and he is very motivated to try losing weight and improve diet. Not on any medications. He does not want to start medical therapy at as yet.   DVT Px- Heparin.    Length of Stay: 5 days   Signed by:  Dow Adolph PGY-I, Internal Medicine Pager [phone] 06/10/2012, 12:15 PM

## 2012-06-10 NOTE — Progress Notes (Signed)
I have seen and examined this patient and agree with plan as outlined above by Delmer Islam PA-S2.  Pt's renal function is getting better off of tenofovir but did have a +ANCA-Pr3 which is nonspecific.  Issue remains whether or not the patient has a vasculitis and whether or nor he is a candidate for immunosuppressive/cytotoxic agents in light of his already compromised immune system.  Before placing him at risk for a renal biopsy due to his thrombocytopenia, we need to d/w his primary HIV MD whether they would consider him a candidate for cytoxan/prednisone/etc if it was warranted.  However, his renal function has returned to below baseline simply by stopping tenofovir and iVF's which makes vasculitis less likely (unless it was drug-induced). Rochella Benner A,MD 06/10/2012 8:38 AM

## 2012-06-10 NOTE — Procedures (Signed)
Renal Bx 16 gauge core times 3 No comp

## 2012-06-10 NOTE — Progress Notes (Signed)
PA Student Daily Progress Note [facility] Subjective:  Pt reports continued fevers last night.  He denies headaches today.  He is still feeling somewhat fatigued.  No significant change from yesterday.  Pt denies any new complaints.  Objective:  Filed Vitals:   06/09/12 1712 06/09/12 2041 06/09/12 2211 06/10/12 0444  BP: 135/66 127/79  136/79  Pulse: 92 112  108  Temp: 98.7 F (37.1 C) 102.7 F (39.3 C) 99.7 F (37.6 C) 98.1 F (36.7 C)  TempSrc:  Oral Oral Oral  Resp: 19 18  18   Height:      Weight:   110.678 kg (244 lb)   SpO2: 100% 99%  98%   Physical Exam: General: Pt is a well developed, well nourished, overweight caucasian male laying comfortably in his bed in no acute distress.  Eyes, Ears, Nose Mouth, Throat: PERRL, Mucous membranes are moist.  Neck: Diffuse cervical and supraclavicular lymphadenopathy with no significant change.  Respiratory: pt breathing easily without use of accessory muscles, faint wheeze auscultated on expiration bilaterally, no rales or rhonchi.  Cardio: Regular rate and rhythm, S1 & S2, noted.  Abdomen: Soft, obese, abdomen distended more notably on the left side. Diffusely tender to palpation. Spleen enlarged and palpable below costal margin. Active BS.  Extremities: no lower extremity edema  Neuro: Pt alert and oriented x3  Skin: Skin intact.  No rashes identified.  Pt has 3 small, 1 cm wide hematomas on his abdomen from previous injections (2 on the lower right abdomen and 1 on the lower left abdomen).  Hyperpigmented 2" x 3" macule identified on left medial ankle.  Pt reports this was from an inflammatory lesion that he saw a dermatologist for and has now resolved.    Psych: Mood and affect appropriate  Assessment/Plan: 1. Acute Renal Failure on Chronic Kidney Disease: Pt's Creatinine remains at his baseline at 1.29. Pt reports continued good urine output. UA has protein at baseline. Work up for [employer] & vasculitis: Pt has elevated  Serine Protease 3 at 107 raising suspicion for vasculitis, specifically Wegner's Granulomatosis. ANCA is pending. 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 Pt's urine is positive for elevated Kappa and Lamda free light chains but light chain deposition not suspected at this time.  Glomerular basement membrane antibodies were normal and his viral hepatitis panel was negative. Antistreptolysin O is slightly elevated at 71 but within the normal range. Microalbumin in urine elevated at 4, microalbumin to Cr ratio slightly elevated at 31.4. Abdominal ultrasound revealed a calculus in upper pole of rt kidney that is consistent with the location of a calculus located in his renal ultrasound in May 2013. Ultrasound also showed splenomegaly with spleen measuring 15.8 cm. Maxillofacial CT showed no signs of Wegner's vasculitis in sinuses.  Dr. Arrie Aran spoke with ID and treatment is feasible for vasculitis even in the pt's immunocompromised state.  Will proceed with renal biopsy assess for renal vasculitis.  Renal function is at baseline since Tenofovir has been stopped. 2. Hyponatremia: Na+ is slightly decreased at 134.  3. Fever and Lymphadenopathy: Workup in process. Atripla on hold with improving renal fxn. If antiretroviral therapy restarted, recommend excluding Tenofovir to prevent ARF. Pt continues to spike fevers up to 102 F.  ID will manage antiretroviral therapy.  Recommend replacing tenofovir with an alternate regimen due to repeated episodes of ARF while on tenofovir.   4. DM II: New onset, pt received DM diet instruction during this admission  5. Pancytopenia: recurrent, pt has been worked up by hematology. Not likely due to CKD. Platelets trending up today at 102.  Labs: Basic Metabolic Panel:  Lab 06/10/12 1610 06/09/12 0615 06/08/12 1014  NA 134* 135 133*  K 3.9 4.0 4.0  CL 103 105 103  CO2 23 21 22   GLUCOSE 180* 218* 287*  BUN 13 15 18     CREATININE 1.29 1.28 1.49*  CALCIUM 8.4 8.4 8.4  ALB -- -- --  PHOS -- -- --   Liver Function Tests:  Lab 06/10/12 0515 06/09/12 0615 06/08/12 1014  AST 12 14 14   ALT 10 11 10   ALKPHOS 43 42 47  BILITOT 0.2* 0.2* 0.3  PROT 6.8 6.7 7.0  ALBUMIN 2.1* 2.1* 2.3*   CBC:  Lab 06/10/12 0515 06/09/12 0615 06/08/12 1548 06/06/12 0640 06/05/12 2219 06/05/12 2103  WBC 3.0* 2.8* 2.8* -- -- --  NEUTROABS -- -- 1.2* -- 1.9 1.9  HGB 7.9* 8.0* 8.5* -- -- --  HCT 24.2* 24.6* 25.2* -- -- --  MCV 89.6 90.1 89.7 88.8 88.8 --  PLT 102* 96* 89* -- -- --   Blood Culture    Component Value Date/Time   SDES BLOOD LEFT ARM 06/05/2012 2220   SPECREQUEST BOTTLES DRAWN AEROBIC AND ANAEROBIC 10CC EACH 06/05/2012 2220   CULT        BLOOD CULTURE RECEIVED NO GROWTH TO DATE CULTURE WILL BE HELD FOR 5 DAYS BEFORE ISSUING A FINAL NEGATIVE REPORT 06/05/2012 2220   REPTSTATUS PENDING 06/05/2012 2220   Micro Results: Recent Results (from the past 240 hour(s))  CULTURE, BLOOD (ROUTINE X 2)     Status: Normal (Preliminary result)   Collection Time   06/05/12 10:10 PM      Component Value Range Status Comment   Specimen Description BLOOD RIGHT ARM   Final    Special Requests BOTTLES DRAWN AEROBIC AND ANAEROBIC 10CC EACH   Final    Culture  Setup Time 06/06/2012 08:11   Final    Culture     Final    Value:        BLOOD CULTURE RECEIVED NO GROWTH TO DATE CULTURE WILL BE HELD FOR 5 DAYS BEFORE ISSUING A FINAL NEGATIVE REPORT   Report Status PENDING   Incomplete   CULTURE, BLOOD (ROUTINE X 2)     Status: Normal (Preliminary result)   Collection Time   06/05/12 10:20 PM      Component Value Range Status Comment   Specimen Description BLOOD LEFT ARM   Final    Special Requests BOTTLES DRAWN AEROBIC AND ANAEROBIC 10CC EACH   Final    Culture  Setup Time 06/06/2012 08:12   Final    Culture     Final    Value:        BLOOD CULTURE RECEIVED NO GROWTH TO DATE CULTURE WILL BE HELD FOR 5 DAYS BEFORE ISSUING A FINAL NEGATIVE  REPORT   Report Status PENDING   Incomplete   MRSA PCR SCREENING     Status: Normal   Collection Time   06/06/12  1:05 AM      Component Value Range Status Comment   MRSA by PCR NEGATIVE  NEGATIVE Final    Studies/Results: Ct Maxillofacial Wo Cm  06/09/2012  *RADIOLOGY REPORT*  Clinical Data: 51 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  HIV.  CT MAXILLOFACIAL WITHOUT CONTRAST  Technique:  Multidetector CT imaging of the maxillofacial structures was performed. Multiplanar CT image reconstructions were also generated.  Comparison: None.  Findings: Visualized noncontrast brain parenchyma is within normal limits. Calcified atherosclerosis at the skull base.  Widespread upper cervical lymphadenopathy partially visible.  Among the largest visible nodes are of left level be, 20 mm short axis.  Tonsillar and adenoid hypertrophy also noted and may be related to the lymphoproliferative disorder.  Visualized orbit soft tissues are within normal limits.  Visualized tympanic cavities and mastoids are clear.  Minimal sphenoid sinus mucosal thickening. Mild to moderate ethmoid sinus mucosal thickening. Frontal sinuses are clear. Moderate bilateral maxillary sinus mucosal thickening which is mostly dependent and at least in part related to mucous retention cyst. Mucosal thickening at both [facility] greater on the left.  The nasal septum is intact.  No osseous erosions.  Negative nasal cavity. No acute osseous abnormality identified.  IMPRESSION: 1.  Mild to moderate conventional appearing ethmoid and maxillary sinus mucosal thickening.  No imaging features of Wegener's granulomatosis identified. 2.  Cervical lymphadenopathy partially visible.  Adenoid and tonsillar hypertrophy.  Favor lymphoproliferative disorder related to HIV, but lymphoma / leukemia not excluded.   Original Report Authenticated By: Harley Hallmark, M.D.    Medications: Scheduled Meds:   . cyanocobalamin  1,000 mcg Intramuscular Q7 days  .  ferrous sulfate  325 mg Oral TID WC  . heparin  5,000 Units Subcutaneous Q8H  . influenza  inactive virus vaccine  0.5 mL Intramuscular Tomorrow-1000  . DISCONTD: colchicine  0.6 mg Oral BID   Continuous Infusions:   . sodium chloride 125 mL/hr at 06/10/12 0619   PRN Meds:.acetaminophen, acetaminophen, ondansetron (ZOFRAN) IV, ondansetron   This is a Psychologist, occupational Note.  The care of the patient was discussed with Dr. Arrie Aran and the assessment and plan formulated with their assistance.  Please see their attached note for official documentation of the daily encounter.  Delmer Islam PA-S2 06/10/2012, 8:46 AM

## 2012-06-10 NOTE — Progress Notes (Signed)
CM following for progression and d/c needs. MD please call CM re plan of care.  Johny Shock RN MPH Case manager [phone]

## 2012-06-10 NOTE — ED Notes (Signed)
Sats 1005 RAIR

## 2012-06-10 NOTE — Progress Notes (Signed)
I have seen and examined this patient and agree with plan as outlined by Delmer Islam, PA-S2.  Discussed case with Dr. Orvan Falconer and agree to stop tenofovir and proceed with renal biopsy for possible vasculitis, however given improving renal function without therapy, this is most c/w drug-induced renal injury.  Will follow h/h post biopsy and f/u with results which should be back by the middle of next week. Kataleya Zaugg A,MD 06/10/2012 4:16 PM

## 2012-06-10 NOTE — ED Notes (Signed)
Pt through with  Procedure. Needs xray- they are coming to do chest XRAY IN [place] room to make it easier on pt. Pt is stable and awake. [place] tech  In room.

## 2012-06-11 ENCOUNTER — Inpatient Hospital Stay (HOSPITAL_COMMUNITY): Payer: Medicaid Other

## 2012-06-11 DIAGNOSIS — B20 Human immunodeficiency virus [HIV] disease: Secondary | ICD-10-CM

## 2012-06-11 DIAGNOSIS — B343 Parvovirus infection, unspecified: Secondary | ICD-10-CM

## 2012-06-11 DIAGNOSIS — D591 Autoimmune hemolytic anemia, unspecified: Secondary | ICD-10-CM

## 2012-06-11 LAB — COMPREHENSIVE METABOLIC PANEL
AST: 11 U/L (ref 0–37)
Albumin: 2 g/dL — ABNORMAL LOW (ref 3.5–5.2)
Alkaline Phosphatase: 42 U/L (ref 39–117)
BUN: 12 mg/dL (ref 6–23)
Chloride: 105 mEq/L (ref 96–112)
Potassium: 4.1 mEq/L (ref 3.5–5.1)
Total Bilirubin: 0.3 mg/dL (ref 0.3–1.2)

## 2012-06-11 LAB — OCCULT BLOOD X 1 CARD TO LAB, STOOL: Fecal Occult Bld: NEGATIVE

## 2012-06-11 LAB — GLUCOSE, CAPILLARY
Glucose-Capillary: 204 mg/dL — ABNORMAL HIGH (ref 70–99)
Glucose-Capillary: 110 mg/dL — ABNORMAL HIGH (ref 70–99)

## 2012-06-11 LAB — CBC
Hemoglobin: 7 g/dL — ABNORMAL LOW (ref 13.0–17.0)
MCH: 29.6 pg (ref 26.0–34.0)
MCV: 89.1 fL (ref 78.0–100.0)
Platelets: 107 10*3/uL — ABNORMAL LOW (ref 150–400)
Platelets: 105 10*3/uL — ABNORMAL LOW (ref 150–400)
RBC: 2.39 MIL/uL — ABNORMAL LOW (ref 4.22–5.81)
RDW: 15.1 % (ref 11.5–15.5)
WBC: 2.8 10*3/uL — ABNORMAL LOW (ref 4.0–10.5)
WBC: 2.8 10*3/uL — ABNORMAL LOW (ref 4.0–10.5)

## 2012-06-11 MED ORDER — INSULIN GLARGINE 100 UNIT/ML ~~LOC~~ SOLN
5.0000 [IU] | Freq: Every day | SUBCUTANEOUS | Status: DC
  Administered 2012-06-11 – 2012-06-14 (×4): 5 [IU] via SUBCUTANEOUS

## 2012-06-11 MED ORDER — DAPSONE 100 MG PO TABS
100.0000 mg | ORAL_TABLET | Freq: Every day | ORAL | Status: DC
  Administered 2012-06-11 – 2012-06-14 (×4): 100 mg via ORAL
  Filled 2012-06-11 (×5): qty 1

## 2012-06-11 MED ORDER — INSULIN ASPART 100 UNIT/ML ~~LOC~~ SOLN
0.0000 [IU] | Freq: Three times a day (TID) | SUBCUTANEOUS | Status: DC
  Administered 2012-06-11: 1 [IU] via SUBCUTANEOUS
  Administered 2012-06-11: 2 [IU] via SUBCUTANEOUS
  Administered 2012-06-11: 3 [IU] via SUBCUTANEOUS
  Administered 2012-06-12 (×2): 2 [IU] via SUBCUTANEOUS
  Administered 2012-06-12: 1 [IU] via SUBCUTANEOUS
  Administered 2012-06-13 – 2012-06-14 (×5): 2 [IU] via SUBCUTANEOUS
  Administered 2012-06-14: 5 [IU] via SUBCUTANEOUS
  Administered 2012-06-15 (×2): 2 [IU] via SUBCUTANEOUS

## 2012-06-11 MED ORDER — DIPHENHYDRAMINE HCL 50 MG PO CAPS
50.0000 mg | ORAL_CAPSULE | ORAL | Status: AC
  Administered 2012-06-11: 50 mg via ORAL
  Filled 2012-06-11 (×2): qty 1

## 2012-06-11 MED ORDER — ACETAMINOPHEN 325 MG PO TABS: 650.0000 mg | ORAL_TABLET | ORAL | Status: AC

## 2012-06-11 MED ORDER — INSULIN ASPART 100 UNIT/ML ~~LOC~~ SOLN: 0.0000 [IU] | Freq: Every day | SUBCUTANEOUS | Status: DC

## 2012-06-11 MED ORDER — IMMUNE GLOBULIN (HUMAN) 10 GM/100ML IV SOLN
1.0000 g/kg | INTRAVENOUS | Status: DC
  Administered 2012-06-11: 115 g via INTRAVENOUS
  Filled 2012-06-11 (×2): qty 1150

## 2012-06-11 NOTE — Consult Note (Signed)
Patrick [name] [id number] Oct 15, 1960 50 y.o. 06/11/2012 1:04 PM   Referring Physician: Teaching Service  Reason For Consult:  Anemia  CC: Dr. Carlyon Prows  Findings: This is a 51 year old man who was last seen by Dr. Shawnie Pons the hematology service in May 2013 pancytopenia.  Patient has a history for HIV infection dating back to 2010.  Admitted with fever of Unknown Origin.  Blood cultures are so far negative.  Also undergoing work up for acute renal insufficiency s/p renal biopsy.   From the hematology stand point, he had recently undergone an extensive work up. Results in summary, note the following:   Left axillary lymph node biopsy performed on 11/24/2011 showed no evidence of a malignant lymphoproliferative process. Radiology studies note splenomegaly.    Bone marrow biopsy on 02/25/12 notes hypercellular marrow with dyspoetic changes involving three cell lines.    Parvovirus B19 IgG was elevated a 4.2   More recent results notes: H/H 7.6/22.9 WBC - 2.8 (3.0/2.9) Platelets 107 (105) Direct coombs and Complement positive with warm autoantibodies.  He otherwise has no new complaints and feels relatively well.    Past Medical History:  Past Medical History  Diagnosis Date  . Hypertension   . HIV (human immunodeficiency virus infection) 2010  . Hypogonadism male   . CKD (chronic kidney disease) stage 3, GFR 30-59 ml/min     hydrated cr 1.5 gfr 48  . Kidney stones     "quite a few times"  . High cholesterol   . Heart murmur   . Exertional dyspnea 02/25/12    "and lying down"  . Anemia   . Blood transfusion   . Hernia     "surgical; where appendix ruptured"  . Pancytopenia with fever 02/26/2012  . LYMPHADENOPATHY, DIFFUSE 11/18/2010    Qualifier: Diagnosis of  By: Sundra Aland NP, Malvin Johns       Past Surgical History  Procedure Date  . Lithotripsy 2010  . Appendectomy 2001    via midline incision  . Axillary lymph node dissection 2011; 2012    left    Allergies:    Allergies  Allergen Reactions  . Codeine Itching, Rash and Other (See Comments)    "crazy dreams"  . Sulfonamide Derivatives Rash    Medications:  Scheduled:   . acetaminophen  650 mg Oral QPM  . cyanocobalamin  1,000 mcg Intramuscular Q7 days  . dapsone  100 mg Oral Daily  . fentaNYL      . ferrous sulfate  325 mg Oral TID WC  . heparin  5,000 Units Subcutaneous Q8H  . influenza  inactive virus vaccine  0.5 mL Intramuscular Tomorrow-1000  . insulin aspart  0-5 Units Subcutaneous QHS  . insulin aspart  0-9 Units Subcutaneous TID WC  . insulin glargine  5 Units Subcutaneous QHS  . midazolam       Continuous:   . DISCONTD: sodium chloride Stopped (06/10/12 2348)   GEX:[id number], acetaminophen, benzonatate, fentaNYL, midazolam, ondansetron (ZOFRAN) IV, ondansetron  Social History:   reports that he has never smoked. He has never used smokeless tobacco. He reports that he drinks alcohol. He reports that he does not use illicit drugs.  Family History:  Family History  Problem Relation Age of Onset  . Adopted: Yes     Review of Systems: Constitutional ROS: Fever, Chills, Night Sweats, Anorexia, Pain  Cardiovascular ROS: no chest pain or dyspnea on exertion Respiratory ROS: no cough, shortness of breath, or wheezing Neurological ROS: No headaches or vision  changes. Gastrointestinal ROS: no abdominal pain, change in bowel habits, or black or bloody stools Genito-Urinary ROS: no dysuria, trouble voiding, or hematuria Musculoskeletal ROS: negative Remaining ROS negative.   Physical Exam: Blood pressure 94/64, pulse 99, temperature 99.8 F (37.7 C), temperature source Oral, resp. rate 18, height 6' 0.05" (1.83 m), weight 248 lb 9.6 oz (112.764 kg), SpO2 97.00%.  General appearance: alert Resp: clear to auscultation bilaterally Cardio: regular rate and rhythm, S1, S2 normal, no murmur, click, rub or gallop GI: Obese, soft, non-tender.  Bowel sounds present.   Extremities: no edema, redness or tenderness in the calves or thighs    Lab Results: LABS:  CBC    Component Value Date/Time   WBC 2.8* 06/11/2012 0608   RBC 2.57* 06/11/2012 0608   HGB 7.6* 06/11/2012 0608   HCT 22.9* 06/11/2012 0608   PLT 107* 06/11/2012 0608   MCV 89.1 06/11/2012 0608   MCH 29.6 06/11/2012 0608   MCHC 33.2 06/11/2012 0608   RDW 15.1 06/11/2012 0608   LYMPHSABS 0.9 06/08/2012 1548   MONOABS 0.6 06/08/2012 1548   EOSABS 0.1 06/08/2012 1548   BASOSABS 0.0 06/08/2012 1548     Basename 06/11/12 0608 06/10/12 0515  NA 137 134*  K 4.1 3.9  CL 105 103  CO2 25 23  GLUCOSE 234* 180*  BUN 12 13  CREATININE 1.26 1.29  CALCIUM 8.3* 8.4    Impression and Plan: 1.  HIV 2. Pancytopenia with dyspoetic changes seen on bone marrow on 02/25/12 involving 3 cell lines.  Etiology likely related to underlying HIV. Patient failed to follow through with Dr Gaylyn Rong as an out patient. 3. Anemia with likely components of hemolysis, iron deficiency, and anemia of chronic disease. Parvovirus IgG was elevated, therefore would be reasonable to treat with 2 courses of IVIG dosed at 1mg /Kg/daily  X 2 doses with tylenol and benadryl premeds.  Also consider IV iron with pharmacy to dose.  Thanks  Will inform Dr. Gaylyn Rong of recent consult and he will follow up tomorrow.     Arlan Organ I., MD 06/11/2012

## 2012-06-11 NOTE — Progress Notes (Signed)
Subjective:    Interval Events:  He is still spiking fevers but overall feels better since admission.  No new complaints. He is undergoing renal biopsy today.     Objective:    Vital Signs:   Temp:  [97.9 F (36.6 C)-99.8 F (37.7 C)] 98.3 F (36.8 C) (09/14 1315) Pulse Rate:  [95-122] 99  (09/14 1315) Resp:  [18-24] 18  (09/14 1315) BP: (94-147)/(53-68) 126/64 mmHg (09/14 1315) SpO2:  [97 %-100 %] 100 % (09/14 1315) Weight:  [248 lb 9.6 oz (112.764 kg)] 248 lb 9.6 oz (112.764 kg) (09/13 2030) Last BM Date: 06/10/12   Weights: 24-hour Weight change: 4 lb 9.6 oz (2.087 kg)  Filed Weights   06/08/12 2100 06/09/12 2211 06/10/12 2030  Weight: 256 lb 1.6 oz (116.166 kg) 244 lb (110.678 kg) 248 lb 9.6 oz (112.764 kg)     Intake/Output:   Intake/Output Summary (Last 24 hours) at 06/11/12 1438 Last data filed at 06/11/12 1300  Gross per 24 hour  Intake    240 ml  Output    400 ml  Net   -160 ml       Physical Exam: General appearance: alert, no distress and morbidly obese Head: Normocephalic, without obvious abnormality, atraumatic Cardio: regular rate and rhythm, S1, S2 normal, no murmur, click, rub or gallop GI: soft, non-tender; bowel sounds normal; no masses,  no organomegaly Extremities: extremities normal, atraumatic, no cyanosis or edema Lymph nodes: Cervical adenopathy: present bilaterally and Axillary adenopathy: present bilaterally  Neurologic: Grossly normal    Labs: Basic Metabolic Panel:  Lab 06/11/12 1610 06/10/12 0515 06/09/12 0615 06/08/12 1014 06/06/12 1630  NA 137 134* 135 133* 128*  K 4.1 3.9 4.0 4.0 4.3  CL 105 103 105 103 98  CO2 25 23 21 22 22   GLUCOSE 234* 180* 218* 287* 243*  BUN 12 13 15 18  32*  CREATININE 1.26 1.29 1.28 1.49* 1.99*  CALCIUM 8.3* 8.4 8.4 -- --  MG -- -- -- -- --  PHOS -- -- -- -- --    Liver Function Tests:  Lab 06/11/12 0608 06/10/12 0515 06/09/12 0615 06/08/12 1014 06/05/12 2103  AST 11 12 14 14 14   ALT  9 10 11 10 13   ALKPHOS 42 43 42 47 61  BILITOT 0.3 0.2* 0.2* 0.3 0.4  PROT 6.5 6.8 6.7 7.0 7.8  ALBUMIN 2.0* 2.1* 2.1* 2.3* 2.8*   No results found for this basename: LIPASE:5,AMYLASE:5 in the last 168 hours No results found for this basename: AMMONIA:3 in the last 168 hours  CBC:  Lab 06/11/12 0608 06/10/12 2047 06/10/12 1759 06/10/12 0515 06/09/12 0615 06/08/12 1548 06/05/12 2219 06/05/12 2103  WBC 2.8* 3.0* 2.9* 3.0* 2.8* -- -- --  NEUTROABS -- -- -- -- -- 1.2* 1.9 1.9  HGB 7.6* 7.8* 7.4* 7.9* 8.0* -- -- --  HCT 22.9* 23.6* 22.6* 24.2* 24.6* -- -- --  MCV 89.1 89.4 88.6 89.6 90.1 -- -- --  PLT 107* 105* 110* 102* 96* -- -- --    Cardiac Enzymes: No results found for this basename: CKTOTAL:5,CKMB:5,CKMBINDEX:5,TROPONINI:5 in the last 168 hours  BNP: No components found with this basename: POCBNP:5  CBG:  Lab 06/11/12 1148 06/11/12 0745  GLUCAP 174* 204*    Coagulation Studies:  Basename 06/10/12 0903  LABPROT 15.6*  INR 1.21    Microbiology: Results for orders placed during the hospital encounter of 06/05/12  CULTURE, BLOOD (ROUTINE X 2)     Status: Normal (Preliminary result)  Collection Time   06/05/12 10:10 PM      Component Value Range Status Comment   Specimen Description BLOOD RIGHT ARM   Final    Special Requests BOTTLES DRAWN AEROBIC AND ANAEROBIC 10CC EACH   Final    Culture  Setup Time 06/06/2012 08:11   Final    Culture     Final    Value:        BLOOD CULTURE RECEIVED NO GROWTH TO DATE CULTURE WILL BE HELD FOR 5 DAYS BEFORE ISSUING A FINAL NEGATIVE REPORT   Report Status PENDING   Incomplete   CULTURE, BLOOD (ROUTINE X 2)     Status: Normal (Preliminary result)   Collection Time   06/05/12 10:20 PM      Component Value Range Status Comment   Specimen Description BLOOD LEFT ARM   Final    Special Requests BOTTLES DRAWN AEROBIC AND ANAEROBIC 10CC EACH   Final    Culture  Setup Time 06/06/2012 08:12   Final    Culture     Final    Value:        BLOOD  CULTURE RECEIVED NO GROWTH TO DATE CULTURE WILL BE HELD FOR 5 DAYS BEFORE ISSUING A FINAL NEGATIVE REPORT   Report Status PENDING   Incomplete   MRSA PCR SCREENING     Status: Normal   Collection Time   06/06/12  1:05 AM      Component Value Range Status Comment   MRSA by PCR NEGATIVE  NEGATIVE Final     Other results:   Imaging: Dg Chest 2 View  06/11/2012  *RADIOLOGY REPORT*  Clinical Data: Shortness of breath.  Possible vasculitis.  HIV. Hypertension.  CHEST - 2 VIEW  Comparison: 06/10/2012  Findings: Cardiac and mediastinal contours appear unremarkable.  Linear retrocardiac opacities most attributable to vasculature. Trace thickening of the left major fissure on the lateral projection.  Posterior pleural thickening bilaterally suggests tiny bilateral pleural effusions, although there is no overt blunting of the costophrenic angles.  No airway thickening noted.  IMPRESSION:  1.  Suspected trace bilateral pleural effusions.   Otherwise, no significant abnormality identified.   Original Report Authenticated By: Dellia Cloud, M.D.    US Biopsy  06/10/2012  *RADIOLOGY REPORT*  Clinical Data/Indication: Renal failure  ULTRASOUND-GUIDED RANDOM RENAL CORTEX CORE BIOPSY.  Sedation: Versed 2.0 mg, Fentanyl 50 mcg.  Total Moderate Sedation Time: 13 minutes.  Procedure: The procedure, risks, benefits, and alternatives were explained to the patient. Questions regarding the procedure were encouraged and answered. The patient understands and consents to the procedure.  The right back was prepped with betadine in a sterile fashion, and a sterile drape was applied covering the operative field. A mask and sterile gloves were used for the procedure.  Under sonographic guidance, three 16 gauge core biopsies of the cortex of the lower pole of the right kidney were obtained. Final imaging was performed.  Patient tolerated the procedure well without complication.  Vital sign monitoring by nursing staff during  the procedure will continue as patient is in the special procedures unit for post procedure observation.  Findings: The images document guide needle placement within the right kidney lower pole cortex. Post biopsy images demonstrate no hemorrhage.  IMPRESSION: Successful ultrasound-guided renal core biopsy.   Original Report Authenticated By: Donavan Burnet, M.D.    Dg Chest Portable 1 View  06/10/2012  *RADIOLOGY REPORT*  Clinical Data: Fever, cough  PORTABLE CHEST - 1 VIEW  Comparison:  06/05/2012  Findings: Lung volumes are low with crowding of the bronchovascular markings.  Heart size upper limits of normal.  Right costophrenic angle omitted in the field of view.  No focal pulmonary opacity. No pleural effusion.  No acute osseous finding.  IMPRESSION: No focal acute finding. If the patient's symptoms continue, consider PA and lateral chest radiographs obtained at full inspiration when the patient is clinically able.   Original Report Authenticated By: Harrel Lemon, M.D.    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  HIV.  CT MAXILLOFACIAL WITHOUT CONTRAST  Technique:  Multidetector CT imaging of the maxillofacial structures was performed. Multiplanar CT image reconstructions were also generated.  Comparison: None.  Findings: Visualized noncontrast brain parenchyma is within normal limits. Calcified atherosclerosis at the skull base.  Widespread upper cervical lymphadenopathy partially visible.  Among the largest visible nodes are of left level be, 20 mm short axis.  Tonsillar and adenoid hypertrophy also noted and may be related to the lymphoproliferative disorder.  Visualized orbit soft tissues are within normal limits.  Visualized tympanic cavities and mastoids are clear.  Minimal sphenoid sinus mucosal thickening. Mild to moderate ethmoid sinus mucosal thickening. Frontal sinuses are clear. Moderate bilateral  maxillary sinus mucosal thickening which is mostly dependent and at least in part related to mucous retention cyst. Mucosal thickening at both [facility] greater on the left.  The nasal septum is intact.  No osseous erosions.  Negative nasal cavity. No acute osseous abnormality identified.  IMPRESSION: 1.  Mild to moderate conventional appearing ethmoid and maxillary sinus mucosal thickening.  No imaging features of Wegener's granulomatosis identified. 2.  Cervical lymphadenopathy partially visible.  Adenoid and tonsillar hypertrophy.  Favor lymphoproliferative disorder related to HIV, but lymphoma / leukemia not excluded.   Original Report Authenticated By: Harley Hallmark, M.D.       Medications:    Infusions:    . DISCONTD: sodium chloride Stopped (06/10/12 2348)     Scheduled Medications:    . acetaminophen  650 mg Oral QPM  . acetaminophen  650 mg Oral Q24H   And  . diphenhydrAMINE  50 mg Oral Q24H  . cyanocobalamin  1,000 mcg Intramuscular Q7 days  . dapsone  100 mg Oral Daily  . fentaNYL      . ferrous sulfate  325 mg Oral TID WC  . heparin  5,000 Units Subcutaneous Q8H  . IMMUNE GLOBULIN 10% (HUMAN) IV - For Fluid Restriction Only  1 g/kg Intravenous Q24H  . influenza  inactive virus vaccine  0.5 mL Intramuscular Tomorrow-1000  . insulin aspart  0-5 Units Subcutaneous QHS  . insulin aspart  0-9 Units Subcutaneous TID WC  . insulin glargine  5 Units Subcutaneous QHS  . midazolam         PRN Medications: acetaminophen, acetaminophen, benzonatate, fentaNYL, midazolam, ondansetron (ZOFRAN) IV, ondansetron   Assessment/ Plan:   Mr. Granier is a 51 year old male with a history of HIV (most recent CD4 count 370 on 05/05/12, VL undetectable), renal insufficiency with baseline creatinine~1.5, hypertension, recurrent lymphadenopathy and fever (since February 2012) thought to be benign reactive changes related to HIV. He presents to the ED with fever and lymphadenopathy since Tuesday. He  has been admitted with similar symptoms in the past.   Fever and lymphadenopathy: Recurrent episodes with similar presentation. Likely seventh episode in last 1.5 years- with possible 5th admission. 2 axillary lymph is very is in an node  biopsy and bone marrow biopsy unrevealing for malignancy or infections. left axillary node bx 11/24/11: Benign lymph node tissue with regressively transformed germinal centers, prominent vascularity and extensive plasmacytosis, architecture generally preserved. Bone marrow Bx 02/24/12: Mild absolute neutropenia, increased bands and toxic granulation. Moderate normocytic, normochromic anemia, no schistocytes, moderate thrombocytopenia. Patient with chronic splenomegaly. Likely HIV related lymphadenopathy.  Has been followed by oncologist Dr. Gaylyn Rong while in hospital and in the clinic. Autoimmune cause has been considered and ANA - negative. He was recently been seen by rheumatologist- Dr. Nickola Major- and has a followup appointment with her on coming Friday. So for all admissions and workup has been unrevealing- no known cause of lymphadenopathy and the general feeling is that these symptoms are related to HIV as opposed to cancer like leukemia or MDS. His bone marrow's cytogenetics were negative. There has been an extended discussion regarding the cause of his fever episodes including infections, iatrogenic (Atripla can cause fevers) autoimmune disorders,malignancies and genetic disorders. Mr Shintani has no history of recent travel outside [place]. He was adopted as a child and therefore he does not know his family history since the adoption was of 'closed' type. At this time the main interest is vasculitis Vs a paraprotein syndrome like monogammopathy of undetermined significancy. Vasculitis is being considered given recurrent AKI whenever he gets flares of his condition together with elevated CRP and ESR.  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 IgG = 2318, Serum free Kappa = 8.9, free lambda 12.9, with kappa/lambda ration - normal.  Urine free kappa 292, and free lambda = 5.38. IgA and IgM are normal. Immunofixation results are pending.  Serine protease is elevated at 107. Wil discuss with the team about consulting hemo/onco. I discussed with Dr Nickola Major (rheumatology) about the new results and she feels we should proceed with renal biopsy to provide tissue diagnosis.  Plan  -  Urine Electrophoresis shows increased levels of the free kappa and lambda light chains in both serum and urine . Serum IgG levels are also elevated. Immunofixation - No monoclonal free light chains (Bence Jones Protein) are detected. Urine IFE shows polyclonal increase in free Kappa and/or free Lambda light chains. -Follow up blood cultures x2 - no growth  - Tylenol when necessary for fever and pain  Acute on chronic kidney disease : Baseline creatinine about 1.4-1.6.  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 Also had pyelonephritis in past. Patient does not seem to have UTI at present. Has granular casts in urine though. Patient hydrating himself at home with Gatorade but still c/o dark yellow urine. Unclear about the cause- but reflective of recent acute process ( as patient has worsening renal function with fever and lymphadenopathy in past). His history is significant for a renal stones. He has followed up with Dr Hyman Hopes but no medical records at this time. The main concern, again is a possibility of a vasculitis going together with is symptoms. Renal ultrasoung -1. Persistent splenomegaly. 2. 2.7 cm shadowing nonobstructing gallstone. There is no evidence for hydronephrosis. 3. Stable nonobstructive right upper pole kidney stone. His renal function has continued to improve with a creatinine of 1.28<1.99 today.  Plan  -Renal biopsy performed  yesterday and results will be back some time next week. - continue with IVF and hold Atripla.  -monitor urine outpt  - FENa is 0.2 (<1%) which suggest prerenal  cause of his acute renal insufficiency. This is likely from dehydration as he reports doing a lot of yard work on Tuesday which is which is when the symptoms started.  - ANA, C3 and C4 complement level, antiglomerular basement membrane antibodies, antistreptolysin O titers are all negative -Noted kappa free light chains free light chains and lambda free light chains. This might explain renal insuffiency with with light chain deposition.  - Renal recommends iron IV    Acute Pancytopenia - WBC 3.6<3.8< 6.4 , platelets 88<93 < 236 .  This has been an issue during previous admissions. History not suggestive of GI or other source of blood loss.  Likely related to acute process. Been followed by hematologist. Bone marrow biopsy and lymph node biopsies unrevealing as in #1. Last ferritin level 685 in May 2013. In June 2013, cultures from bone marrow aspirate was still negative for AFB, fungal, viral. His bone marrow's cytogenetics were negative. Therefore his low blood counts and fever were thought to be most likely reactive. There was no concern for primary bone marrow disease such as leukemia, lymphoma, MDS. Dr Jethro Bolus (oncologist) recommended that he follows up with his primary doctor and HIV  He was seen by Dr Nickola Major, a rheumatologist who orders several tests including ESR 55, C-reactive protein 8.5, HLA-B27 negative, CCP antibodies, IgG - negative, rheumatoid factor less than 13, ANA negative, C3 complement 147, C4 compliment 27, uric acid 7.0, and ANCA panel -all Negative. TSH 3.85, testosterone 2.77. X-ray of hand to rule osteoarthritis - normal. He has a followup appointment on 06/10/2012.  Plan:  -We will monitor CBC. Mr Tadros has had similar pancytopenia with his last admissions and test to purse cause has not revealed any cause other than  possible HIV related.  - Low hemo - there is a dropped by 2.7 points over his duration of hospitalisation - 4 days. I don't suspect acute hemorrhage but I think this might be due to the acute pancytopenia which bone marrow suppression.  - Privigen immunoglobulin IV as per hematology.  Cough: Patient has cough is which has been persistent and disturbing for the last couple of days. Chest exam is normal and not acute changes on CXR with two views  - continue with Tessalon   Anemia:  This could be related to the pancytopenia or hematemesis.  Note decreasing hemoglobin 7.6<7.9<8.0<9.5<12.1.  FOBT - negative LDH - normal at , Haptoglobulin - elevated at 210 ,  Coombs test positive  ,reticulocytes count - low at 13.0 Iron low at <10, UIBC low at 119. Total iron binding capacity, and saturation cannot be calculated due to low iron. Hematology consults has been initiated to assist on the management of this anemia Will monitor with daily cbc and consider transfusion if Hb continues to drops.  Splenomegaly. Noted Ultrasound of abdomen on 9/10  with splenomegaly of 15.8 cm. Review of previous CT scan show similar splenomegaly as well 26.5cm in 10/2010. These findings might be related to the anemia, pancytopenia and lymphadenopathy.   HIV- compliant with Atripla.Recent CD4 count 180<370< 210<110. VL remains <20. My only concern today is the issue to Atripla given Mr Caddock's changes in renal function and history of renal stones.  Plan   - Will consider an non-Tenofivir option.  - started on Dapsone again give low cd4 count after talking with Dr Orvan Falconer  - Awaiting results for HLA [id number] and susceptibility testing for intergrase inhibitors  Hyponatremia - resolved: has been in low 130s over past few  months, further decrease likely secondary to some volume depletion and hyperglycemia. Systolic blood pressure on lower side. He was not orthostatic. I suspect dehydration.  Sodium 127 - corrected sodium -  137 today Plan  -will monitor -discontinue iv fluids   HTN- systolic blood pressure on lower side.  Plan  - Hold Labetolol- restart as tolerated.  DM 2: Hyperglycemia on admission-368 in setting of acute process  Last A1c 6.6 in May 2013 and repeat A1c shows 7.1% with elevated serum glucose.  Marland Kitchen He meets the diagnostic criteria for Diabetes.  - start Lantus 5 IU at bedtime with SSI with meals  DVT Px- Heparin.    Length of Stay: 6 days   Signed by:  Dow Adolph PGY-I, Internal Medicine Pager [phone] 06/11/2012, 2:38 PM

## 2012-06-11 NOTE — Progress Notes (Signed)
Patient ID: Patrick [name], male   DOB: 01/30/1961, 51 y.o.   MRN: [id number]    [facility]    Date of Admission:  06/05/2012     Principal Problem:  *Acute-on-chronic kidney injury Active Problems:  HIV DISEASE  HYPERTENSION  LYMPHADENOPATHY, DIFFUSE  NEPHROLITHIASIS  Pancytopenia with fever  Hyponatremia  Diabetes, Type II      . acetaminophen  650 mg Oral QPM  . cyanocobalamin  1,000 mcg Intramuscular Q7 days  . fentaNYL      . ferrous sulfate  325 mg Oral TID WC  . heparin  5,000 Units Subcutaneous Q8H  . influenza  inactive virus vaccine  0.5 mL Intramuscular Tomorrow-1000  . insulin aspart  0-5 Units Subcutaneous QHS  . insulin aspart  0-9 Units Subcutaneous TID WC  . insulin glargine  5 Units Subcutaneous QHS  . midazolam        Subjective: Patrick [name] is feeling a little better but is worn out and frustrated by the increasingly frequent episodes of fever, lymphadenopathy, pancytopenia and renal insufficiency.  Objective: Temp:  [97.9 F (36.6 C)-99.8 F (37.7 C)] 99.8 F (37.7 C) (09/14 0943) Pulse Rate:  [95-122] 99  (09/14 0943) Resp:  [18-24] 18  (09/14 0943) BP: (94-147)/(52-68) 94/64 mmHg (09/14 0943) SpO2:  [97 %-100 %] 97 % (09/14 0943) Weight:  [112.764 kg (248 lb 9.6 oz)] 112.764 kg (248 lb 9.6 oz) (09/13 2030)  General: He is alert and comfortable in bed Lymph nodes: Massively enlarged submandibular nodes persist; they are nontender and rubbery Skin: No rash Lungs: Clear Cor: Regular S1 and S2 and no murmurs Abdomen: Obese soft and nontender  Lab Results Lab Results  Component Value Date   WBC 2.8* 06/11/2012   HGB 7.6* 06/11/2012   HCT 22.9* 06/11/2012   MCV 89.1 06/11/2012   PLT 107* 06/11/2012    Lab Results  Component Value Date   CREATININE 1.26 06/11/2012   BUN 12 06/11/2012   NA 137 06/11/2012   K 4.1 06/11/2012   CL 105 06/11/2012   CO2 25 06/11/2012    Lab Results  Component Value Date   ALT 9 06/11/2012   AST 11 06/11/2012   ALKPHOS 42 06/11/2012   BILITOT 0.3 06/11/2012      Microbiology: Recent Results (from the past 240 hour(s))  CULTURE, BLOOD (ROUTINE X 2)     Status: Normal (Preliminary result)   Collection Time   06/05/12 10:10 PM      Component Value Range Status Comment   Specimen Description BLOOD RIGHT ARM   Final    Special Requests BOTTLES DRAWN AEROBIC AND ANAEROBIC 10CC EACH   Final    Culture  Setup Time 06/06/2012 08:11   Final    Culture     Final    Value:        BLOOD CULTURE RECEIVED NO GROWTH TO DATE CULTURE WILL BE HELD FOR 5 DAYS BEFORE ISSUING A FINAL NEGATIVE REPORT   Report Status PENDING   Incomplete   CULTURE, BLOOD (ROUTINE X 2)     Status: Normal (Preliminary result)   Collection Time   06/05/12 10:20 PM      Component Value Range Status Comment   Specimen Description BLOOD LEFT ARM   Final    Special Requests BOTTLES DRAWN AEROBIC AND ANAEROBIC 10CC [facility]   Final    Culture  Setup Time 06/06/2012 08:12   Final    Culture     Final  Value:        BLOOD CULTURE RECEIVED NO GROWTH TO DATE CULTURE WILL BE HELD FOR 5 DAYS BEFORE ISSUING A FINAL NEGATIVE REPORT   Report Status PENDING   Incomplete   MRSA PCR SCREENING     Status: Normal   Collection Time   06/06/12  1:05 AM      Component Value Range Status Comment   MRSA by PCR NEGATIVE  NEGATIVE Final     Studies/Results: US Biopsy  06/10/2012  *RADIOLOGY REPORT*  Clinical Data/Indication: Renal failure  ULTRASOUND-GUIDED RANDOM RENAL CORTEX CORE BIOPSY.  Sedation: Versed 2.0 mg, Fentanyl 50 mcg.  Total Moderate Sedation Time: 13 minutes.  Procedure: The procedure, risks, benefits, and alternatives were explained to the patient. Questions regarding the procedure were encouraged and answered. The patient understands and consents to the procedure.  The right back was prepped with betadine in a sterile fashion, and a sterile drape was applied covering the operative field. A mask and sterile gloves were used for the  procedure.  Under sonographic guidance, three 16 gauge core biopsies of the cortex of the lower pole of the right kidney were obtained. Final imaging was performed.  Patient tolerated the procedure well without complication.  Vital sign monitoring by nursing staff during the procedure will continue as patient is in the special procedures unit for post procedure observation.  Findings: The images document guide needle placement within the right kidney lower pole cortex. Post biopsy images demonstrate no hemorrhage.  IMPRESSION: Successful ultrasound-guided renal core biopsy.   Original Report Authenticated By: Donavan Burnet, M.D.    Dg Chest Portable 1 View  06/10/2012  *RADIOLOGY REPORT*  Clinical Data: Fever, cough  PORTABLE CHEST - 1 VIEW  Comparison: 06/05/2012  Findings: Lung volumes are low with crowding of the bronchovascular markings.  Heart size upper limits of normal.  Right costophrenic angle omitted in the field of view.  No focal pulmonary opacity. No pleural effusion.  No acute osseous finding.  IMPRESSION: No focal acute finding. If the patient's symptoms continue, consider PA and lateral chest radiographs obtained at full inspiration when the patient is clinically able.   Original Report Authenticated By: Harrel Lemon, M.D.    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  HIV.  CT MAXILLOFACIAL WITHOUT CONTRAST  Technique:  Multidetector CT imaging of the maxillofacial structures was performed. Multiplanar CT image reconstructions were also generated.  Comparison: None.  Findings: Visualized noncontrast brain parenchyma is within normal limits. Calcified atherosclerosis at the skull base.  Widespread upper cervical lymphadenopathy partially visible.  Among the largest visible nodes are of left level be, 20 mm short axis.  Tonsillar and adenoid hypertrophy also noted and may be related to the  lymphoproliferative disorder.  Visualized orbit soft tissues are within normal limits.  Visualized tympanic cavities and mastoids are clear.  Minimal sphenoid sinus mucosal thickening. Mild to moderate ethmoid sinus mucosal thickening. Frontal sinuses are clear. Moderate bilateral maxillary sinus mucosal thickening which is mostly dependent and at least in part related to mucous retention cyst. Mucosal thickening at both [facility] greater on the left.  The nasal septum is intact.  No osseous erosions.  Negative nasal cavity. No acute osseous abnormality identified.  IMPRESSION: 1.  Mild to moderate conventional appearing ethmoid and maxillary sinus mucosal thickening.  No imaging features of Wegener's granulomatosis identified. 2.  Cervical lymphadenopathy partially visible.  Adenoid and tonsillar hypertrophy.  Favor lymphoproliferative disorder related to HIV, but lymphoma / leukemia not excluded.   Original Report Authenticated By: Harley Hallmark, M.D.     Assessment: We still do not have a clear explanation for his cyclical febrile illness. The positive ANKA may be the clue that we have been searching for. His renal insufficiency has resolved again but I agree with not restarting tenofovir (as a component of Atripla).  Plan: 1. Observe off of antiretroviral therapy for now 2. Await results of renal biopsy  Cliffton Asters, MD [facility] [phone] pager   [phone] cell 06/11/2012, 11:55 AM

## 2012-06-11 NOTE — Progress Notes (Signed)
Patient ID: Patrick [name], male   DOB: 02-02-1961, 51 y.o.   MRN: [id number] S:no new complaints O:BP 101/53  Pulse 95  Temp 97.9 F (36.6 C) (Oral)  Resp 18  Ht 6' 0.05" (1.83 m)  Wt 112.764 kg (248 lb 9.6 oz)  BMI 33.67 kg/m2  SpO2 99%  Intake/Output Summary (Last 24 hours) at 06/11/12 0847 Last data filed at 06/11/12 0000  Gross per 24 hour  Intake    240 ml  Output    400 ml  Net   -160 ml   Intake/Output: I/O last 3 completed shifts: In: 1540 [P.O.:240; I.V.:1300] Out: 400 [Urine:400]  Intake/Output this shift:    Weight change: 2.087 kg (4 lb 9.6 oz) Gen:WD obese WM in NAD CVS:rrr no rub Resp:CTA Abd:+BS, soft, NT/ND WUJ:WJXBJ edema   Lab 06/11/12 0608 06/10/12 0515 06/09/12 0615 06/08/12 1014 06/06/12 1630 06/06/12 0640 06/05/12 2219 06/05/12 2103  NA 137 134* 135 133* 128* 129* 127* --  K 4.1 3.9 4.0 4.0 4.3 4.0 4.1 --  CL 105 103 105 103 98 96 94* --  CO2 25 23 21 22 22 22 23  --  GLUCOSE 234* 180* 218* 287* 243* 273* 367* --  BUN 12 13 15 18  32* 33* 33* --  CREATININE 1.26 1.29 1.28 1.49* 1.99* 2.23* 2.59* --  ALBUMIN 2.0* 2.1* 2.1* 2.3* -- -- -- 2.8*  CALCIUM 8.3* 8.4 8.4 8.4 8.6 8.7 8.7 --  PHOS -- -- -- -- -- -- -- --  AST 11 12 14 14  -- -- -- 14  ALT 9 10 11 10  -- -- -- 13   Liver Function Tests:  Lab 06/11/12 0608 06/10/12 0515 06/09/12 0615  AST 11 12 14   ALT 9 10 11   ALKPHOS 42 43 42  BILITOT 0.3 0.2* 0.2*  PROT 6.5 6.8 6.7  ALBUMIN 2.0* 2.1* 2.1*   No results found for this basename: LIPASE:3,AMYLASE:3 in the last 168 hours No results found for this basename: AMMONIA:3 in the last 168 hours CBC:  Lab 06/11/12 0608 06/10/12 2047 06/10/12 1759 06/10/12 0515 06/09/12 0615 06/08/12 1548 06/05/12 2219 06/05/12 2103  WBC 2.8* 3.0* 2.9* -- -- -- -- --  NEUTROABS -- -- -- -- -- 1.2* 1.9 1.9  HGB 7.6* 7.8* 7.4* -- -- -- -- --  HCT 22.9* 23.6* 22.6* -- -- -- -- --  MCV 89.1 89.4 88.6 89.6 90.1 -- -- --  PLT 107* 105* 110* -- -- -- -- --    Cardiac Enzymes: No results found for this basename: CKTOTAL:5,CKMB:5,CKMBINDEX:5,TROPONINI:5 in the last 168 hours CBG:  Lab 06/11/12 0745  GLUCAP 204*    Iron Studies:  Basename 06/10/12 1245  IRON <10*  TIBC Not calculated due to Iron <10.  TRANSFERRIN --  FERRITIN 584*   Studies/Results: US Biopsy  06/10/2012  *RADIOLOGY REPORT*  Clinical Data/Indication: Renal failure  ULTRASOUND-GUIDED RANDOM RENAL CORTEX CORE BIOPSY.  Sedation: Versed 2.0 mg, Fentanyl 50 mcg.  Total Moderate Sedation Time: 13 minutes.  Procedure: The procedure, risks, benefits, and alternatives were explained to the patient. Questions regarding the procedure were encouraged and answered. The patient understands and consents to the procedure.  The right back was prepped with betadine in a sterile fashion, and a sterile drape was applied covering the operative field. A mask and sterile gloves were used for the procedure.  Under sonographic guidance, three 16 gauge core biopsies of the cortex of the lower pole of the right kidney were obtained. Final imaging was  performed.  Patient tolerated the procedure well without complication.  Vital sign monitoring by nursing staff during the procedure will continue as patient is in the special procedures unit for post procedure observation.  Findings: The images document guide needle placement within the right kidney lower pole cortex. Post biopsy images demonstrate no hemorrhage.  IMPRESSION: Successful ultrasound-guided renal core biopsy.   Original Report Authenticated By: Donavan Burnet, M.D.    Dg Chest Portable 1 View  06/10/2012  *RADIOLOGY REPORT*  Clinical Data: Fever, cough  PORTABLE CHEST - 1 VIEW  Comparison: 06/05/2012  Findings: Lung volumes are low with crowding of the bronchovascular markings.  Heart size upper limits of normal.  Right costophrenic angle omitted in the field of view.  No focal pulmonary opacity. No pleural effusion.  No acute osseous finding.   IMPRESSION: No focal acute finding. If the patient's symptoms continue, consider PA and lateral chest radiographs obtained at full inspiration when the patient is clinically able.   Original Report Authenticated By: Harrel Lemon, M.D.    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  HIV.  CT MAXILLOFACIAL WITHOUT CONTRAST  Technique:  Multidetector CT imaging of the maxillofacial structures was performed. Multiplanar CT image reconstructions were also generated.  Comparison: None.  Findings: Visualized noncontrast brain parenchyma is within normal limits. Calcified atherosclerosis at the skull base.  Widespread upper cervical lymphadenopathy partially visible.  Among the largest visible nodes are of left level be, 20 mm short axis.  Tonsillar and adenoid hypertrophy also noted and may be related to the lymphoproliferative disorder.  Visualized orbit soft tissues are within normal limits.  Visualized tympanic cavities and mastoids are clear.  Minimal sphenoid sinus mucosal thickening. Mild to moderate ethmoid sinus mucosal thickening. Frontal sinuses are clear. Moderate bilateral maxillary sinus mucosal thickening which is mostly dependent and at least in part related to mucous retention cyst. Mucosal thickening at both [facility] greater on the left.  The nasal septum is intact.  No osseous erosions.  Negative nasal cavity. No acute osseous abnormality identified.  IMPRESSION: 1.  Mild to moderate conventional appearing ethmoid and maxillary sinus mucosal thickening.  No imaging features of Wegener's granulomatosis identified. 2.  Cervical lymphadenopathy partially visible.  Adenoid and tonsillar hypertrophy.  Favor lymphoproliferative disorder related to HIV, but lymphoma / leukemia not excluded.   Original Report Authenticated By: Harley Hallmark, M.D.       . acetaminophen  650 mg Oral QPM  . cyanocobalamin  1,000 mcg  Intramuscular Q7 days  . fentaNYL      . ferrous sulfate  325 mg Oral TID WC  . heparin  5,000 Units Subcutaneous Q8H  . influenza  inactive virus vaccine  0.5 mL Intramuscular Tomorrow-1000  . insulin aspart  0-5 Units Subcutaneous QHS  . insulin aspart  0-9 Units Subcutaneous TID WC  . insulin glargine  5 Units Subcutaneous QHS  . midazolam      . DISCONTD: heparin  5,000 Units Subcutaneous Q8H    BMET    Component Value Date/Time   NA 137 06/11/2012 0608   K 4.1 06/11/2012 0608   CL 105 06/11/2012 0608   CO2 25 06/11/2012 0608   GLUCOSE 234* 06/11/2012 0608   BUN 12 06/11/2012 0608   CREATININE 1.26 06/11/2012 0608   CREATININE 1.20 05/05/2012 0930   CALCIUM 8.3* 06/11/2012 0608   CALCIUM 9.6 09/03/2011 1652   GFRNONAA 65* 06/11/2012 1610  GFRAA 75* 06/11/2012 0608   CBC    Component Value Date/Time   WBC 2.8* 06/11/2012 0608   RBC 2.57* 06/11/2012 0608   HGB 7.6* 06/11/2012 0608   HCT 22.9* 06/11/2012 0608   PLT 107* 06/11/2012 0608   MCV 89.1 06/11/2012 0608   MCH 29.6 06/11/2012 0608   MCHC 33.2 06/11/2012 0608   RDW 15.1 06/11/2012 0608   LYMPHSABS 0.9 06/08/2012 1548   MONOABS 0.6 06/08/2012 1548   EOSABS 0.1 06/08/2012 1548   BASOSABS 0.0 06/08/2012 1548     Assessment/Plan: 1. AKI/CKD: s/p renal biopsy 06/10/12. Pt's Creatinine remains at his baseline. Pt reports continued good urine output. UA has protein at baseline. Work up for [employer] & vasculitis: Pt has elevated Serine Protease 3 at 107 raising suspicion for vasculitis, specifically Wegner's Granulomatosis. 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 Pt's urine is positive for elevated Kappa and Lamda free light chains but light chain deposition not suspected at this time. Glomerular basement membrane antibodies were normal and his viral hepatitis panel was negative.Tenofovir has been stopped and will not be restarted 2. Hyponatremia: Na+ is slightly decreased at 134.    3. Fever and Lymphadenopathy: awaiting results of renal biopsy which won't be back until mid next week.  Workup in process. Atripla on hold with improving renal fxn. If antiretroviral therapy restarted, recommend excluding Tenofovir to prevent ARF. Pt continues to spike fevers up to 102 F. ID will manage antiretroviral therapy. Recommend replacing tenofovir with an alternate regimen due to repeated episodes of ARF while on tenofovir.  4. DM II: New onset, pt received DM diet instruction during this admission  5. Pancytopenia: recurrent, pt has been worked up by hematology. Not likely due to CKD. Platelets trending up today at 102. 6. Iron deficiency- consider IV feraheme 510mg .   7. Anemia- ?hemolytic, Hematology to evaluate 8. Will sign off for now.  Call with questions or concerns.  Will f/u with renal bx but for now nothing further to add.  Virtie Bungert A

## 2012-06-12 ENCOUNTER — Inpatient Hospital Stay (HOSPITAL_COMMUNITY): Payer: Medicaid Other

## 2012-06-12 DIAGNOSIS — R161 Splenomegaly, not elsewhere classified: Secondary | ICD-10-CM

## 2012-06-12 DIAGNOSIS — R599 Enlarged lymph nodes, unspecified: Secondary | ICD-10-CM

## 2012-06-12 LAB — CBC
HCT: 22.6 % — ABNORMAL LOW (ref 39.0–52.0)
Hemoglobin: 7.3 g/dL — ABNORMAL LOW (ref 13.0–17.0)
MCV: 89 fL (ref 78.0–100.0)
Platelets: 124 10*3/uL — ABNORMAL LOW (ref 150–400)
RBC: 2.72 MIL/uL — ABNORMAL LOW (ref 4.22–5.81)
RDW: 15.1 % (ref 11.5–15.5)
WBC: 3.3 10*3/uL — ABNORMAL LOW (ref 4.0–10.5)
WBC: 3.5 10*3/uL — ABNORMAL LOW (ref 4.0–10.5)

## 2012-06-12 LAB — COMPREHENSIVE METABOLIC PANEL
BUN: 15 mg/dL (ref 6–23)
Calcium: 8.3 mg/dL — ABNORMAL LOW (ref 8.4–10.5)
Creatinine, Ser: 1.28 mg/dL (ref 0.50–1.35)
GFR calc Af Amer: 74 mL/min — ABNORMAL LOW (ref >90)
Glucose, Bld: 172 mg/dL — ABNORMAL HIGH (ref 70–99)
Sodium: 133 mEq/L — ABNORMAL LOW (ref 135–145)
Total Protein: 7.6 g/dL (ref 6.0–8.3)

## 2012-06-12 LAB — CULTURE, BLOOD (ROUTINE X 2): Culture: NO GROWTH

## 2012-06-12 LAB — GLUCOSE, CAPILLARY
Glucose-Capillary: 165 mg/dL — ABNORMAL HIGH (ref 70–99)
Glucose-Capillary: 143 mg/dL — ABNORMAL HIGH (ref 70–99)

## 2012-06-12 MED ORDER — HYDROCOD POLST-CHLORPHEN POLST 10-8 MG/5ML PO LQCR
5.0000 mL | Freq: Two times a day (BID) | ORAL | Status: DC | PRN
  Administered 2012-06-12 – 2012-06-14 (×4): 5 mL via ORAL
  Filled 2012-06-12 (×4): qty 5

## 2012-06-12 MED ORDER — ZOLPIDEM TARTRATE 5 MG PO TABS
5.0000 mg | ORAL_TABLET | Freq: Every evening | ORAL | Status: DC | PRN
  Administered 2012-06-12 – 2012-06-14 (×3): 5 mg via ORAL
  Filled 2012-06-12 (×3): qty 1

## 2012-06-12 MED ORDER — GUAIFENESIN-DM 100-10 MG/5ML PO SYRP
5.0000 mL | ORAL_SOLUTION | ORAL | Status: DC | PRN
  Filled 2012-06-12 (×2): qty 5

## 2012-06-12 NOTE — Progress Notes (Addendum)
Subjective:    Interval Events:  He is still spiking fevers but overall feels better since admission.  He is complaining of lack of sleep, and he requests for some pillows to assist him.     Objective:    Vital Signs:   Temp:  [98.2 F (36.8 C)-99.5 F (37.5 C)] 98.6 F (37 C) (09/15 1000) Pulse Rate:  [61-105] 92  (09/15 1000) Resp:  [16-18] 16  (09/15 1000) BP: (110-146)/(69-77) 110/72 mmHg (09/15 1000) SpO2:  [93 %-100 %] 96 % (09/15 1000) Weight:  [250 lb 14.4 oz (113.807 kg)] 250 lb 14.4 oz (113.807 kg) (09/14 2302) Last BM Date: 06/11/12   Weights: 24-hour Weight change: 2 lb 4.8 oz (1.043 kg)  Filed Weights   06/09/12 2211 06/10/12 2030 06/11/12 2302  Weight: 244 lb (110.678 kg) 248 lb 9.6 oz (112.764 kg) 250 lb 14.4 oz (113.807 kg)     Intake/Output:   Intake/Output Summary (Last 24 hours) at 06/12/12 1434 Last data filed at 06/12/12 0900  Gross per 24 hour  Intake    360 ml  Output    150 ml  Net    210 ml       Physical Exam: General appearance: alert, no distress and morbidly obese Head: Normocephalic, without obvious abnormality, atraumatic Cardio: regular rate and rhythm, S1, S2 normal, no murmur, click, rub or gallop GI: soft, non-tender; bowel sounds normal; no masses,  no organomegaly Extremities: extremities normal, atraumatic, no cyanosis or edema Lymph nodes: Cervical adenopathy: present bilaterally and Axillary adenopathy: present bilaterally  Neurologic: Grossly normal    Labs: Basic Metabolic Panel:  Lab 06/12/12 4540 06/11/12 0608 06/10/12 0515 06/09/12 0615 06/08/12 1014  NA 133* 137 134* 135 133*  K 3.9 4.1 3.9 4.0 4.0  CL 102 105 103 105 103  CO2 24 25 23 21 22   GLUCOSE 172* 234* 180* 218* 287*  BUN 15 12 13 15 18   CREATININE 1.28 1.26 1.29 1.28 1.49*  CALCIUM 8.3* 8.3* 8.4 -- --  MG -- -- -- -- --  PHOS -- -- -- -- --    Liver Function Tests:  Lab 06/12/12 0515 06/11/12 0608 06/10/12 0515 06/09/12 0615 06/08/12  1014  AST 11 11 12 14 14   ALT 7 9 10 11 10   ALKPHOS 43 42 43 42 47  BILITOT 0.2* 0.3 0.2* 0.2* 0.3  PROT 7.6 6.5 6.8 6.7 7.0  ALBUMIN 2.0* 2.0* 2.1* 2.1* 2.3*   No results found for this basename: LIPASE:5,AMYLASE:5 in the last 168 hours No results found for this basename: AMMONIA:3 in the last 168 hours  CBC:  Lab 06/12/12 0515 06/11/12 1845 06/11/12 0608 06/10/12 2047 06/10/12 1759 06/08/12 1548 06/05/12 2219 06/05/12 2103  WBC 3.3* 2.8* 2.8* 3.0* 2.9* -- -- --  NEUTROABS -- -- -- -- -- 1.2* 1.9 1.9  HGB 7.3* 7.0* 7.6* 7.8* 7.4* -- -- --  HCT 22.6* 21.3* 22.9* 23.6* 22.6* -- -- --  MCV 89.3 89.1 89.1 89.4 88.6 -- -- --  PLT 116* 105* 107* 105* 110* -- -- --    Cardiac Enzymes: No results found for this basename: CKTOTAL:5,CKMB:5,CKMBINDEX:5,TROPONINI:5 in the last 168 hours  BNP: No components found with this basename: POCBNP:5  CBG:  Lab 06/12/12 1202 06/12/12 0757 06/11/12 2123 06/11/12 1703 06/11/12 1148  GLUCAP 177* 165* 110* 126* 174*    Coagulation Studies:  Basename 06/10/12 0903  LABPROT 15.6*  INR 1.21    Microbiology: Results for orders placed during the hospital  encounter of 06/05/12  CULTURE, BLOOD (ROUTINE X 2)     Status: Normal   Collection Time   06/05/12 10:10 PM      Component Value Range Status Comment   Specimen Description BLOOD RIGHT ARM   Final    Special Requests BOTTLES DRAWN AEROBIC AND ANAEROBIC 10CC EACH   Final    Culture  Setup Time 06/06/2012 08:11   Final    Culture NO GROWTH 5 DAYS   Final    Report Status 06/12/2012 FINAL   Final   CULTURE, BLOOD (ROUTINE X 2)     Status: Normal   Collection Time   06/05/12 10:20 PM      Component Value Range Status Comment   Specimen Description BLOOD LEFT ARM   Final    Special Requests BOTTLES DRAWN AEROBIC AND ANAEROBIC 10CC EACH   Final    Culture  Setup Time 06/06/2012 08:12   Final    Culture NO GROWTH 5 DAYS   Final    Report Status 06/12/2012 FINAL   Final   MRSA PCR SCREENING      Status: Normal   Collection Time   06/06/12  1:05 AM      Component Value Range Status Comment   MRSA by PCR NEGATIVE  NEGATIVE Final     Other results:   Imaging: Dg Chest 2 View  06/11/2012  *RADIOLOGY REPORT*  Clinical Data: Shortness of breath.  Possible vasculitis.  HIV. Hypertension.  CHEST - 2 VIEW  Comparison: 06/10/2012  Findings: Cardiac and mediastinal contours appear unremarkable.  Linear retrocardiac opacities most attributable to vasculature. Trace thickening of the left major fissure on the lateral projection.  Posterior pleural thickening bilaterally suggests tiny bilateral pleural effusions, although there is no overt blunting of the costophrenic angles.  No airway thickening noted.  IMPRESSION:  1.  Suspected trace bilateral pleural effusions.   Otherwise, no significant abnormality identified.   Original Report Authenticated By: Dellia Cloud, M.D.    US Biopsy  06/10/2012  *RADIOLOGY REPORT*  Clinical Data/Indication: Renal failure  ULTRASOUND-GUIDED RANDOM RENAL CORTEX CORE BIOPSY.  Sedation: Versed 2.0 mg, Fentanyl 50 mcg.  Total Moderate Sedation Time: 13 minutes.  Procedure: The procedure, risks, benefits, and alternatives were explained to the patient. Questions regarding the procedure were encouraged and answered. The patient understands and consents to the procedure.  The right back was prepped with betadine in a sterile fashion, and a sterile drape was applied covering the operative field. A mask and sterile gloves were used for the procedure.  Under sonographic guidance, three 16 gauge core biopsies of the cortex of the lower pole of the right kidney were obtained. Final imaging was performed.  Patient tolerated the procedure well without complication.  Vital sign monitoring by nursing staff during the procedure will continue as patient is in the special procedures unit for post procedure observation.  Findings: The images document guide needle placement within the  right kidney lower pole cortex. Post biopsy images demonstrate no hemorrhage.  IMPRESSION: Successful ultrasound-guided renal core biopsy.   Original Report Authenticated By: Donavan Burnet, M.D.    Dg Chest Portable 1 View  06/10/2012  *RADIOLOGY REPORT*  Clinical Data: Fever, cough  PORTABLE CHEST - 1 VIEW  Comparison: 06/05/2012  Findings: Lung volumes are low with crowding of the bronchovascular markings.  Heart size upper limits of normal.  Right costophrenic angle omitted in the field of view.  No focal pulmonary opacity. No pleural effusion.  No acute osseous finding.  IMPRESSION: No focal acute finding. If the patient's symptoms continue, consider PA and lateral chest radiographs obtained at full inspiration when the patient is clinically able.   Original Report Authenticated By: Harrel Lemon, M.D.       Medications:    Infusions:     Scheduled Medications:    . acetaminophen  650 mg Oral QPM  . acetaminophen  650 mg Oral Q24H   And  . diphenhydrAMINE  50 mg Oral Q24H  . cyanocobalamin  1,000 mcg Intramuscular Q7 days  . dapsone  100 mg Oral Daily  . ferrous sulfate  325 mg Oral TID WC  . heparin  5,000 Units Subcutaneous Q8H  . insulin aspart  0-5 Units Subcutaneous QHS  . insulin aspart  0-9 Units Subcutaneous TID WC  . insulin glargine  5 Units Subcutaneous QHS  . DISCONTD: IMMUNE GLOBULIN 10% (HUMAN) IV - For Fluid Restriction Only  1 g/kg Intravenous Q24H     PRN Medications: benzonatate, guaiFENesin-dextromethorphan, ondansetron (ZOFRAN) IV, ondansetron, zolpidem, DISCONTD: acetaminophen, DISCONTD: acetaminophen   Assessment/ Plan:   Mr. Nauss is a 51 year old male with a history of HIV (most recent CD4 count 370 on 05/05/12, VL undetectable), renal insufficiency with baseline creatinine~1.5, hypertension, recurrent lymphadenopathy and fever (since February 2012) thought to be benign reactive changes related to HIV. He presents to the ED with fever and  lymphadenopathy since Tuesday. He has been admitted with similar symptoms in the past.   Fever and lymphadenopathy: Recurrent episodes with similar presentation. Likely seventh episode in last 1.5 years- with possible 5th admission. 2 axillary lymph is very is in an node biopsy and bone marrow biopsy unrevealing for malignancy or infections. left axillary node bx 11/24/11: Benign lymph node tissue with regressively transformed germinal centers, prominent vascularity and extensive plasmacytosis, architecture generally preserved. Bone marrow Bx 02/24/12: Mild absolute neutropenia, increased bands and toxic granulation. Moderate normocytic, normochromic anemia, no schistocytes, moderate thrombocytopenia. Patient with chronic splenomegaly. Likely HIV related lymphadenopathy.  Has been followed by oncologist Dr. Gaylyn Rong while in hospital and in the clinic. Autoimmune cause has been considered and ANA - negative. He was recently been seen by rheumatologist- Dr. Nickola Major- and has a followup appointment with her on coming Friday. So for all admissions and workup has been unrevealing- no known cause of lymphadenopathy and the general feeling is that these symptoms are related to HIV as opposed to cancer like leukemia or MDS. His bone marrow's cytogenetics were negative. There has been an extended discussion regarding the cause of his fever episodes including infections, iatrogenic (Atripla can cause fevers) autoimmune disorders,malignancies and genetic disorders. Mr Favret has no history of recent travel outside [place]. He was adopted as a child and therefore he does not know his family history since the adoption was of 'closed' type. At this time the main interest is vasculitis Vs a paraprotein syndrome like monogammopathy of undetermined significancy. Vasculitis is being considered given recurrent AKI whenever he gets flares of his condition together with elevated CRP and ESR.  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 IgG = 2318, Serum free Kappa = 8.9, free lambda 12.9, with kappa/lambda ration - normal.  Urine free kappa 292, and free lambda = 5.38. IgA and IgM are normal. Immunofixation results are pending.  Serine protease is elevated at 107. Wil discuss with the team about consulting hemo/onco. I discussed with Dr Nickola Major (  rheumatology) about the new results and she feels we should proceed with renal biopsy to provide tissue diagnosis.  Plan  -  Urine Electrophoresis shows increased levels of the free kappa and lambda light chains in both serum and urine . Serum IgG levels are also elevated. Immunofixation - No monoclonal free light chains (Bence Jones Protein) are detected. Urine IFE shows polyclonal increase in free Kappa and/or free Lambda light chains. -Follow up blood cultures x2 - no growth  - Tylenol when necessary for fever and pain  Acute on chronic kidney disease : Baseline creatinine about 1.4-1.6.  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 Also had pyelonephritis in past. Patient does not seem to have UTI at present. Has granular casts in urine though. Patient hydrating himself at home with Gatorade but still c/o dark yellow urine. Unclear about the cause- but reflective of recent acute process ( as patient has worsening renal function with fever and lymphadenopathy in past). His history is significant for a renal stones. He has followed up with Dr Hyman Hopes but no medical records at this time. The main concern, again is a possibility of a vasculitis going together with is symptoms. Renal ultrasoung -1. Persistent splenomegaly. 2. 2.7 cm shadowing nonobstructing gallstone. There is no evidence for hydronephrosis. 3. Stable nonobstructive right upper pole kidney stone. His renal function has continued to improve with a creatinine of 1.28<1.99  today.  Plan  -Renal biopsy performed yesterday and results will be back some time next week. - continue with IVF and hold Atripla.  -monitor urine outpt  - FENa is 0.2 (<1%) which suggest prerenal cause of his acute renal insufficiency. This is likely from dehydration as he reports doing a lot of yard work on Tuesday which is which is when the symptoms started.  - ANA, C3 and C4 complement level, antiglomerular basement membrane antibodies, antistreptolysin O titers are all negative -Noted kappa free light chains free light chains and lambda free light chains. This might explain renal insuffiency with with light chain deposition.  - We will continue with oral fluids. - Will do repeat US to rule out ascite - do daily weights    Acute Pancytopenia - WBC today 3.3 , platelets 116.  This has been an issue during previous admissions. History not suggestive of GI or other source of blood loss.  Likely related to acute process. Been followed by hematologist. Bone marrow biopsy and lymph node biopsies unrevealing as in #1. Last ferritin level 685 in May 2013. In June 2013, cultures from bone marrow aspirate was still negative for AFB, fungal, viral. His bone marrow's cytogenetics were negative. Therefore his low blood counts and fever were thought to be most likely reactive. There was no concern for primary bone marrow disease such as leukemia, lymphoma, MDS. Dr Jethro Bolus (oncologist) recommended that he follows up with his primary doctor and HIV  He was seen by Dr Nickola Major, a rheumatologist who orders several tests including ESR 55, C-reactive protein 8.5, HLA-B27 negative, CCP antibodies, IgG - negative, rheumatoid factor less than 13, ANA negative, C3 complement 147, C4 compliment 27, uric acid 7.0, and ANCA panel -all Negative. TSH 3.85, testosterone 2.77. X-ray of hand to rule osteoarthritis - normal. He has a followup appointment on 06/10/2012.  Plan:  -We will monitor CBC. Mr Campton has had similar  pancytopenia with his last admissions and test to purse cause has not revealed any cause other than possible HIV related.  -  Low hemo - there is a dropped by 2.7 points over his duration of hospitalisation - 4 days. I don't suspect acute hemorrhage but I think this might be due to the acute pancytopenia which bone marrow suppression.  - Privigen immunoglobulin IV has been discontinued - Dr Gaylyn Rong recommends second opinion from [facility] upon discharging Mr Garciamartinez.  Cough: Patient has cough is which has been persistent and disturbing for the last couple of days. Chest exam is normal and not acute changes on CXR with two views  - continue with Tessalon   Anemia:  This could be related to the pancytopenia or hematemesis.  Note decreasing hemoglobin 7.6<7.9<8.0<9.5<12.1.  FOBT - negative LDH - normal at , Haptoglobulin - elevated at 210 ,  Coombs test positive  ,reticulocytes count - low at 13.0 Iron low at <10, UIBC low at 119. Total iron binding capacity, and saturation cannot be calculated due to low iron. Hematology consults has been initiated to assist on the management of this anemia We will transfuse him with one unit of PRBC and repeat HH  Splenomegaly. Noted Ultrasound of abdomen on 9/10  with splenomegaly of 15.8 cm. Review of previous CT scan show similar splenomegaly as well 26.5cm in 10/2010. These findings might be related to the anemia, pancytopenia and lymphadenopathy.  -Repeat ultrasound repeat today  HIV- compliant with Atripla.Recent CD4 count 180<370< 210<110. VL remains <20. My only concern today is the issue to Atripla given Mr Caddock's changes in renal function and history of renal stones.  Plan   - Will consider an non-Tenofivir option.  - started on Dapsone again give low cd4 count after talking with Dr Orvan Falconer  - Awaiting results for HLA [id number] and susceptibility testing for intergrase inhibitors  Hyponatremia - resolved: has been in low 130s over past few months, further  decrease likely secondary to some volume depletion and hyperglycemia. Systolic blood pressure on lower side. He was not orthostatic. I suspect dehydration.  Sodium 127 - corrected sodium - 137 today Plan  -will monitor -with monitor since stopping the iv fluids  HTN- systolic blood pressure on lower side.  Plan  - Hold Labetolol- restart as tolerated.  DM 2: Hyperglycemia on admission-368 in setting of acute process  Last A1c 6.6 in May 2013 and repeat A1c shows 7.1% with elevated serum glucose.  Marland Kitchen He meets the diagnostic criteria for Diabetes.  - start Lantus 5 IU at bedtime with SSI with meals  DVT Px- Heparin.    Length of Stay: 7 days   Signed by:  Dow Adolph PGY-I, Internal Medicine Pager [phone] 06/12/2012, 2:34 PM

## 2012-06-12 NOTE — Progress Notes (Signed)
[facility] INPATIENT HEMATOLOGY CONSULT FOLLOW UP NOTE  Name: Patrick [name]      MRN: [id number]    Location: 6737/6737-01  Date: 06/12/2012 Time:1:57 PM   Subjective: Interval History:Ghassan W Lemmerman presented again this admission with diffuse adenopathy, fever, myalgia/athralgia.  I last saw him in clinic in June 2013 when he was getting over his last episode.  He was evaluated by Rheumatology and Nephrology.  He underwent renal biopsy on Friday 06/10/12.  He has seen defervesed.  He still has diffuse adenopathy and LUQ full ness.  Normally, it takes him two weeks to resolve his adenopathy. He has mild nonproductive cough.  He denied head ache, mucositis, nausea/vomiting, skin rash, skin abscess, diarrhea.   Objective: Vital signs in last 24 hours: Temp:  [98.2 F (36.8 C)-99.5 F (37.5 C)] 98.6 F (37 C) (09/15 1000) Pulse Rate:  [61-105] 92  (09/15 1000) Resp:  [16-18] 16  (09/15 1000) BP: (110-146)/(69-77) 110/72 mmHg (09/15 1000) SpO2:  [93 %-100 %] 96 % (09/15 1000) Weight:  [250 lb 14.4 oz (113.807 kg)] 250 lb 14.4 oz (113.807 kg) (09/14 2302)    Intake/Output from previous day: 09/14 0701 - 09/15 0700 In: 480 [P.O.:480] Out: 450 [Urine:450]     PHYSICAL EXAM:  Gen: Well-nourished man, in no acute distress but tired appearing.  Eyes: No scleral icterus or jaundice. ENT: There was no oropharyngeal lesions. Neck was supple without thyromegaly. Lymphatics: diffuse submental, bilateral cervical, supraclav, axillary adenopathy.  Respiratory: Lungs were clear bilaterally without wheezing or crackles. Cardiovascular: normal heart rate and rhythm; S1/S2; without murmur, rubs, or gallop. There was no pedal edema. GI: Abdomen was soft, flat, nontender, nondistended.  Positive for splenomegaly about 8cm below costal margin.  Musculoskeletal exam: No spinal tenderness on palpation of vertebral spine. Skin exam was without ecchymosis, petechiae. Neuro exam was nonfocal.        Studies/Results: Results for orders placed during the hospital encounter of 06/05/12 (from the past 48 hour(s))  CBC     Status: Abnormal   Collection Time   06/10/12  5:59 PM      Component Value Range Comment   WBC 2.9 (*) 4.0 - 10.5 K/uL    RBC 2.55 (*) 4.22 - 5.81 MIL/uL    Hemoglobin 7.4 (*) 13.0 - 17.0 g/dL    HCT 40.9 (*) 81.1 - 52.0 %    MCV 88.6  78.0 - 100.0 fL    MCH 29.0  26.0 - 34.0 pg    MCHC 32.7  30.0 - 36.0 g/dL    RDW 91.4  78.2 - 95.6 %    Platelets 110 (*) 150 - 400 K/uL CONSISTENT WITH PREVIOUS RESULT  CBC     Status: Abnormal   Collection Time   06/10/12  8:47 PM      Component Value Range Comment   WBC 3.0 (*) 4.0 - 10.5 K/uL    RBC 2.64 (*) 4.22 - 5.81 MIL/uL    Hemoglobin 7.8 (*) 13.0 - 17.0 g/dL    HCT 21.3 (*) 08.6 - 52.0 %    MCV 89.4  78.0 - 100.0 fL    MCH 29.5  26.0 - 34.0 pg    MCHC 33.1  30.0 - 36.0 g/dL    RDW 57.8  46.9 - 62.9 %    Platelets 105 (*) 150 - 400 K/uL CONSISTENT WITH PREVIOUS RESULT  COMPREHENSIVE METABOLIC PANEL     Status: Abnormal   Collection Time   06/11/12  6:08 AM      Component Value Range Comment   Sodium 137  135 - 145 mEq/L    Potassium 4.1  3.5 - 5.1 mEq/L    Chloride 105  96 - 112 mEq/L    CO2 25  19 - 32 mEq/L    Glucose, Bld 234 (*) 70 - 99 mg/dL    BUN 12  6 - 23 mg/dL    Creatinine, Ser 9.14  0.50 - 1.35 mg/dL    Calcium 8.3 (*) 8.4 - 10.5 mg/dL    Total Protein 6.5  6.0 - 8.3 g/dL    Albumin 2.0 (*) 3.5 - 5.2 g/dL    AST 11  0 - 37 U/L    ALT 9  0 - 53 U/L    Alkaline Phosphatase 42  39 - 117 U/L    Total Bilirubin 0.3  0.3 - 1.2 mg/dL    GFR calc non Af Amer 65 (*) >90 mL/min    GFR calc Af Amer 75 (*) >90 mL/min   CBC     Status: Abnormal   Collection Time   06/11/12  6:08 AM      Component Value Range Comment   WBC 2.8 (*) 4.0 - 10.5 K/uL    RBC 2.57 (*) 4.22 - 5.81 MIL/uL    Hemoglobin 7.6 (*) 13.0 - 17.0 g/dL    HCT 78.2 (*) 95.6 - 52.0 %    MCV 89.1  78.0 - 100.0 fL    MCH 29.6  26.0 -  34.0 pg    MCHC 33.2  30.0 - 36.0 g/dL    RDW 21.3  08.6 - 57.8 %    Platelets 107 (*) 150 - 400 K/uL CONSISTENT WITH PREVIOUS RESULT  GLUCOSE, CAPILLARY     Status: Abnormal   Collection Time   06/11/12  7:45 AM      Component Value Range Comment   Glucose-Capillary 204 (*) 70 - 99 mg/dL   GLUCOSE, CAPILLARY     Status: Abnormal   Collection Time   06/11/12 11:48 AM      Component Value Range Comment   Glucose-Capillary 174 (*) 70 - 99 mg/dL   OCCULT BLOOD X 1 CARD TO LAB, STOOL     Status: Normal   Collection Time   06/11/12  1:13 PM      Component Value Range Comment   Fecal Occult Bld NEGATIVE     GLUCOSE, CAPILLARY     Status: Abnormal   Collection Time   06/11/12  5:03 PM      Component Value Range Comment   Glucose-Capillary 126 (*) 70 - 99 mg/dL   CBC     Status: Abnormal   Collection Time   06/11/12  6:45 PM      Component Value Range Comment   WBC 2.8 (*) 4.0 - 10.5 K/uL    RBC 2.39 (*) 4.22 - 5.81 MIL/uL    Hemoglobin 7.0 (*) 13.0 - 17.0 g/dL    HCT 46.9 (*) 62.9 - 52.0 %    MCV 89.1  78.0 - 100.0 fL    MCH 29.3  26.0 - 34.0 pg    MCHC 32.9  30.0 - 36.0 g/dL    RDW 52.8  41.3 - 24.4 %    Platelets 105 (*) 150 - 400 K/uL CONSISTENT WITH PREVIOUS RESULT  GLUCOSE, CAPILLARY     Status: Abnormal   Collection Time   06/11/12  9:23 PM  Component Value Range Comment   Glucose-Capillary 110 (*) 70 - 99 mg/dL   COMPREHENSIVE METABOLIC PANEL     Status: Abnormal   Collection Time   06/12/12  5:15 AM      Component Value Range Comment   Sodium 133 (*) 135 - 145 mEq/L    Potassium 3.9  3.5 - 5.1 mEq/L    Chloride 102  96 - 112 mEq/L    CO2 24  19 - 32 mEq/L    Glucose, Bld 172 (*) 70 - 99 mg/dL    BUN 15  6 - 23 mg/dL    Creatinine, Ser 1.61  0.50 - 1.35 mg/dL    Calcium 8.3 (*) 8.4 - 10.5 mg/dL    Total Protein 7.6  6.0 - 8.3 g/dL    Albumin 2.0 (*) 3.5 - 5.2 g/dL    AST 11  0 - 37 U/L    ALT 7  0 - 53 U/L    Alkaline Phosphatase 43  39 - 117 U/L    Total  Bilirubin 0.2 (*) 0.3 - 1.2 mg/dL    GFR calc non Af Amer 64 (*) >90 mL/min    GFR calc Af Amer 74 (*) >90 mL/min   CBC     Status: Abnormal   Collection Time   06/12/12  5:15 AM      Component Value Range Comment   WBC 3.3 (*) 4.0 - 10.5 K/uL    RBC 2.53 (*) 4.22 - 5.81 MIL/uL    Hemoglobin 7.3 (*) 13.0 - 17.0 g/dL    HCT 09.6 (*) 04.5 - 52.0 %    MCV 89.3  78.0 - 100.0 fL    MCH 28.9  26.0 - 34.0 pg    MCHC 32.3  30.0 - 36.0 g/dL    RDW 40.9  81.1 - 91.4 %    Platelets 116 (*) 150 - 400 K/uL CONSISTENT WITH PREVIOUS RESULT  GLUCOSE, CAPILLARY     Status: Abnormal   Collection Time   06/12/12  7:57 AM      Component Value Range Comment   Glucose-Capillary 165 (*) 70 - 99 mg/dL   PREPARE RBC (CROSSMATCH)     Status: Normal   Collection Time   06/12/12 10:55 AM      Component Value Range Comment   Order Confirmation ORDER PROCESSED BY BLOOD BANK     TYPE AND SCREEN     Status: Normal (Preliminary result)   Collection Time   06/12/12 10:55 AM      Component Value Range Comment   ABO/RH(D) O POS      Antibody Screen POS      Sample Expiration 06/15/2012      Antibody Identification PENDING      DAT, IgG POS     GLUCOSE, CAPILLARY     Status: Abnormal   Collection Time   06/12/12 12:02 PM      Component Value Range Comment   Glucose-Capillary 177 (*) 70 - 99 mg/dL    Dg Chest 2 View  7/82/9562  *RADIOLOGY REPORT*  Clinical Data: Shortness of breath.  Possible vasculitis.  HIV. Hypertension.  CHEST - 2 VIEW  Comparison: 06/10/2012  Findings: Cardiac and mediastinal contours appear unremarkable.  Linear retrocardiac opacities most attributable to vasculature. Trace thickening of the left major fissure on the lateral projection.  Posterior pleural thickening bilaterally suggests tiny bilateral pleural effusions, although there is no overt blunting of the costophrenic angles.  No airway thickening noted.  IMPRESSION:  1.  Suspected trace bilateral pleural effusions.   Otherwise, no  significant abnormality identified.   Original Report Authenticated By: Dellia Cloud, M.D.    US Biopsy  06/10/2012  *RADIOLOGY REPORT*  Clinical Data/Indication: Renal failure  ULTRASOUND-GUIDED RANDOM RENAL CORTEX CORE BIOPSY.  Sedation: Versed 2.0 mg, Fentanyl 50 mcg.  Total Moderate Sedation Time: 13 minutes.  Procedure: The procedure, risks, benefits, and alternatives were explained to the patient. Questions regarding the procedure were encouraged and answered. The patient understands and consents to the procedure.  The right back was prepped with betadine in a sterile fashion, and a sterile drape was applied covering the operative field. A mask and sterile gloves were used for the procedure.  Under sonographic guidance, three 16 gauge core biopsies of the cortex of the lower pole of the right kidney were obtained. Final imaging was performed.  Patient tolerated the procedure well without complication.  Vital sign monitoring by nursing staff during the procedure will continue as patient is in the special procedures unit for post procedure observation.  Findings: The images document guide needle placement within the right kidney lower pole cortex. Post biopsy images demonstrate no hemorrhage.  IMPRESSION: Successful ultrasound-guided renal core biopsy.   Original Report Authenticated By: Donavan Burnet, M.D.    Dg Chest Portable 1 View  06/10/2012  *RADIOLOGY REPORT*  Clinical Data: Fever, cough  PORTABLE CHEST - 1 VIEW  Comparison: 06/05/2012  Findings: Lung volumes are low with crowding of the bronchovascular markings.  Heart size upper limits of normal.  Right costophrenic angle omitted in the field of view.  No focal pulmonary opacity. No pleural effusion.  No acute osseous finding.  IMPRESSION: No focal acute finding. If the patient's symptoms continue, consider PA and lateral chest radiographs obtained at full inspiration when the patient is clinically able.   Original Report Authenticated  By: Harrel Lemon, M.D.      MEDICATIONS: reviewed.     Assessment/Plan:  1.  HIV/AIDS:  On HIV meds per ID.  He is on Dapsone prophy. 2.  Recurrent fever, pancytopenia, extensive adenopathy and splenomegaly:  Negative excisional node biopsy for infection or lymphoma.  Bone marrow biopsy showed dysplasia; however, no cytogenetic changes.  Thus, dysplastic finding on bone marrow aspirate was most likely reactive.   Not sure what he is reacting to.  He has been on HAART for a while with improvement of his CD4 count and still undetectable viral load.  - I strongly advised him 2nd opinion at [facility] upon discharge for evaluation.   He agreed with this plan.   3.  Slightly elevated Cr:  Ruling out vasculitis.  S/p renal biopsy 06/10/12.  4.  Anemia: This is due to anemia of chronic inflammation.  No sign of hemolysis (normal LDH and no elevated Tbili).  His parvo virus IgG was positive but IgM was negative.  I do not recommend IVIg (which was given yesterday and is due for 2nd dose today) since there is no hemolysis and that he has borderline renal function.  IVIg can at time cause renal failure.   He does not have iron deficiency anemia (ferritin was elevated).  I do not endorse IV Iron.

## 2012-06-12 NOTE — Progress Notes (Signed)
Patient ID: Patrick [name], male   DOB: 07/01/1961, 51 y.o.   MRN: [id number]    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 acetaminophen  650 mg Oral QPM  . acetaminophen  650 mg Oral Q24H   And  . diphenhydrAMINE  50 mg Oral Q24H  . cyanocobalamin  1,000 mcg Intramuscular Q7 days  . dapsone  100 mg Oral Daily  . ferrous sulfate  325 mg Oral TID WC  . heparin  5,000 Units Subcutaneous Q8H  . insulin aspart  0-5 Units Subcutaneous QHS  . insulin aspart  0-9 Units Subcutaneous TID WC  . insulin glargine  5 Units Subcutaneous QHS  . DISCONTD: IMMUNE GLOBULIN 10% (HUMAN) IV - For Fluid Restriction Only  1 g/kg Intravenous Q24H    Subjective: He is generally feeling better but still has a dry cough that usually occurs during his febrile episodes. The only thing that's giving him any relief of his cough in the past has been hydrocodone chlorpheniramine syrup which was prescribed last admission.  Objective: Temp:  [97.8 F (36.6 C)-99.5 F (37.5 C)] 97.8 F (36.6 C) (09/15 1310) Pulse Rate:  [61-105] 77  (09/15 1310) Resp:  [16-18] 18  (09/15 1310) BP: (110-146)/(60-77) 135/60 mmHg (09/15 1310) SpO2:  [93 %-100 %] 98 % (09/15 1310) Weight:  [113.807 kg (250 lb 14.4 oz)] 113.807 kg (250 lb 14.4 oz) (09/14 2302)  General: No change in adenopathy in his neck Skin: No rash Lungs: Clear Cor: Regular S1 and S2 with no murmurs Abdomen: Obese, soft nontender  Lab Results Lab Results  Component Value Date   WBC 3.3* 06/12/2012   HGB 7.3* 06/12/2012   HCT 22.6* 06/12/2012   MCV 89.3 06/12/2012   PLT 116* 06/12/2012    Lab Results  Component Value Date   CREATININE 1.28 06/12/2012   BUN  15 06/12/2012   NA 133* 06/12/2012   K 3.9 06/12/2012   CL 102 06/12/2012   CO2 24 06/12/2012    HIV 1 RNA Quant (copies/mL)  Date Value  06/06/2012 <20   05/05/2012 <20   01/05/2012 <20      CD4 T Cell Abs (cmm)  Date Value  06/06/2012 180*  05/05/2012 370*  01/05/2012 210*    Assessment: As he has in the past, he is defervescing and improving slowly.  Plan: 1. Await results of renal biopsy 2. Recommend hydrocodone chlorpheniramine syrup to suppress cough as needed  Cliffton Asters, MD [facility] [phone] pager   [phone] cell 06/12/2012, 3:50 PM

## 2012-06-13 LAB — CBC WITH DIFFERENTIAL/PLATELET
Basophils Absolute: 0 10*3/uL (ref 0.0–0.1)
Eosinophils Relative: 2 % (ref 0–5)
Lymphocytes Relative: 40 % (ref 12–46)
Lymphs Abs: 1.3 10*3/uL (ref 0.7–4.0)
MCV: 89.3 fL (ref 78.0–100.0)
Neutro Abs: 1.3 10*3/uL — ABNORMAL LOW (ref 1.7–7.7)
Neutrophils Relative %: 42 % — ABNORMAL LOW (ref 43–77)
Platelets: 120 10*3/uL — ABNORMAL LOW (ref 150–400)
RBC: 2.72 MIL/uL — ABNORMAL LOW (ref 4.22–5.81)
RDW: 15 % (ref 11.5–15.5)
WBC: 3.2 10*3/uL — ABNORMAL LOW (ref 4.0–10.5)

## 2012-06-13 LAB — BASIC METABOLIC PANEL
CO2: 27 mEq/L (ref 19–32)
GFR calc non Af Amer: 66 mL/min — ABNORMAL LOW (ref >90)
Glucose, Bld: 195 mg/dL — ABNORMAL HIGH (ref 70–99)
Potassium: 4 mEq/L (ref 3.5–5.1)
Sodium: 133 mEq/L — ABNORMAL LOW (ref 135–145)

## 2012-06-13 LAB — GLUCOSE, CAPILLARY
Glucose-Capillary: 184 mg/dL — ABNORMAL HIGH (ref 70–99)
Glucose-Capillary: 174 mg/dL — ABNORMAL HIGH (ref 70–99)
Glucose-Capillary: 191 mg/dL — ABNORMAL HIGH (ref 70–99)

## 2012-06-13 LAB — CBC
Hemoglobin: 8.3 g/dL — ABNORMAL LOW (ref 13.0–17.0)
Platelets: 123 10*3/uL — ABNORMAL LOW (ref 150–400)
RBC: 2.86 MIL/uL — ABNORMAL LOW (ref 4.22–5.81)

## 2012-06-13 NOTE — Progress Notes (Signed)
Subjective:    Interval Events:  He has been without fever for the last 5 days.Marland Kitchen He slept well last night. No new complaints. He received one unit of blood     Objective:    Vital Signs:   Temp:  [98 F (36.7 C)-99.4 F (37.4 C)] 98.3 F (36.8 C) (09/16 1347) Pulse Rate:  [91-102] 102  (09/16 1347) Resp:  [18-21] 20  (09/16 1347) BP: (111-153)/(56-80) 153/80 mmHg (09/16 1347) SpO2:  [96 %-99 %] 97 % (09/16 1347) Weight:  [251 lb 5.2 oz (114 kg)] 251 lb 5.2 oz (114 kg) (09/15 2142) Last BM Date: 06/12/12   Weights: 24-hour Weight change: 6.8 oz (0.192 kg)  Filed Weights   06/10/12 2030 06/11/12 2302 06/12/12 2142  Weight: 248 lb 9.6 oz (112.764 kg) 250 lb 14.4 oz (113.807 kg) 251 lb 5.2 oz (114 kg)     Intake/Output:   Intake/Output Summary (Last 24 hours) at 06/13/12 1405 Last data filed at 06/13/12 1300  Gross per 24 hour  Intake 1300.5 ml  Output      0 ml  Net 1300.5 ml       Physical Exam: General appearance: alert, no distress and morbidly obese Head: Normocephalic, without obvious abnormality, atraumatic Cardio: regular rate and rhythm, S1, S2 normal, no murmur, click, rub or gallop GI: soft, non-tender; bowel sounds normal; no masses,  no organomegaly Extremities: extremities normal, atraumatic, no cyanosis or edema Lymph nodes: Cervical adenopathy: present bilaterally and Axillary adenopathy: present bilaterally  Neurologic: Grossly normal    Labs: Basic Metabolic Panel:  Lab 06/13/12 6295 06/12/12 0515 06/11/12 0608 06/10/12 0515 06/09/12 0615  NA 133* 133* 137 134* 135  K 4.0 3.9 4.1 3.9 4.0  CL 100 102 105 103 105  CO2 27 24 25 23 21   GLUCOSE 195* 172* 234* 180* 218*  BUN 17 15 12 13 15   CREATININE 1.25 1.28 1.26 1.29 1.28  CALCIUM 8.6 8.3* 8.3* -- --  MG -- -- -- -- --  PHOS -- -- -- -- --    Liver Function Tests:  Lab 06/12/12 0515 06/11/12 0608 06/10/12 0515 06/09/12 0615 06/08/12 1014  AST 11 11 12 14 14   ALT 7 9 10 11 10     ALKPHOS 43 42 43 42 47  BILITOT 0.2* 0.3 0.2* 0.2* 0.3  PROT 7.6 6.5 6.8 6.7 7.0  ALBUMIN 2.0* 2.0* 2.1* 2.1* 2.3*   No results found for this basename: LIPASE:5,AMYLASE:5 in the last 168 hours No results found for this basename: AMMONIA:3 in the last 168 hours  CBC:  Lab 06/13/12 1312 06/13/12 0445 06/12/12 2113 06/12/12 0515 06/11/12 1845 06/08/12 1548  WBC 3.2* 3.2* 3.5* 3.3* 2.8* --  NEUTROABS 1.3* -- -- -- -- 1.2*  HGB 8.0* 8.3* 7.9* 7.3* 7.0* --  HCT 24.3* 25.7* 24.2* 22.6* 21.3* --  MCV 89.3 89.9 89.0 89.3 89.1 --  PLT 120* 123* 124* 116* 105* --    Cardiac Enzymes: No results found for this basename: CKTOTAL:5,CKMB:5,CKMBINDEX:5,TROPONINI:5 in the last 168 hours  BNP: No components found with this basename: POCBNP:5  CBG:  Lab 06/13/12 1157 06/13/12 0739 06/12/12 2143 06/12/12 1649 06/12/12 1202  GLUCAP 174* 184* 160* 143* 177*    Coagulation Studies: No results found for this basename: LABPROT:5,INR:5 in the last 72 hours  Microbiology: Results for orders placed during the hospital encounter of 06/05/12  CULTURE, BLOOD (ROUTINE X 2)     Status: Normal   Collection Time   06/05/12 10:10  PM      Component Value Range Status Comment   Specimen Description BLOOD RIGHT ARM   Final    Special Requests BOTTLES DRAWN AEROBIC AND ANAEROBIC 10CC EACH   Final    Culture  Setup Time 06/06/2012 08:11   Final    Culture NO GROWTH 5 DAYS   Final    Report Status 06/12/2012 FINAL   Final   CULTURE, BLOOD (ROUTINE X 2)     Status: Normal   Collection Time   06/05/12 10:20 PM      Component Value Range Status Comment   Specimen Description BLOOD LEFT ARM   Final    Special Requests BOTTLES DRAWN AEROBIC AND ANAEROBIC 10CC EACH   Final    Culture  Setup Time 06/06/2012 08:12   Final    Culture NO GROWTH 5 DAYS   Final    Report Status 06/12/2012 FINAL   Final   MRSA PCR SCREENING     Status: Normal   Collection Time   06/06/12  1:05 AM      Component Value Range Status  Comment   MRSA by PCR NEGATIVE  NEGATIVE Final     Other results:   Imaging: US Abdomen Limited  06/12/2012  *RADIOLOGY REPORT*  Clinical Data: Abdominal distension.  End-stage renal disease. Question ascites.  LIMITED ABDOMEN ULTRASOUND FOR ASCITES  Technique:  Limited ultrasound survey for ascites was performed in all four abdominal quadrants.  Comparison:  Abdominal ultrasound 06/07/2012.  Findings: Limited evaluation of all four quadrants was performed. No ascites is demonstrated.  IMPRESSION: No evidence of ascites.   Original Report Authenticated By: Gerrianne Scale, M.D.       Medications:    Infusions:     Scheduled Medications:    . acetaminophen  650 mg Oral QPM  . acetaminophen  650 mg Oral Q24H   And  . diphenhydrAMINE  50 mg Oral Q24H  . cyanocobalamin  1,000 mcg Intramuscular Q7 days  . dapsone  100 mg Oral Daily  . ferrous sulfate  325 mg Oral TID WC  . heparin  5,000 Units Subcutaneous Q8H  . insulin aspart  0-5 Units Subcutaneous QHS  . insulin aspart  0-9 Units Subcutaneous TID WC  . insulin glargine  5 Units Subcutaneous QHS     PRN Medications: chlorpheniramine-HYDROcodone, ondansetron (ZOFRAN) IV, ondansetron, zolpidem, DISCONTD: benzonatate, DISCONTD: guaiFENesin-dextromethorphan   Assessment/ Plan:   Mr. Swallows is a 51 year old male with a history of HIV (most recent CD4 count 370 on 05/05/12, VL undetectable), renal insufficiency with baseline creatinine~1.5, hypertension, recurrent lymphadenopathy and fever (since February 2012) thought to be benign reactive changes related to HIV. He presents to the ED with fever and lymphadenopathy since Tuesday. He has been admitted with similar symptoms in the past.   Fever and lymphadenopathy: Recurrent episodes with similar presentation. Likely seventh episode in last 1.5 years- with possible 5th admission. 2 axillary lymph is very is in an node biopsy and bone marrow biopsy unrevealing for malignancy or  infections. left axillary node bx 11/24/11: Benign lymph node tissue with regressively transformed germinal centers, prominent vascularity and extensive plasmacytosis, architecture generally preserved. Bone marrow Bx 02/24/12: Mild absolute neutropenia, increased bands and toxic granulation. Moderate normocytic, normochromic anemia, no schistocytes, moderate thrombocytopenia. Patient with chronic splenomegaly. Likely HIV related lymphadenopathy.  Has been followed by oncologist Dr. Gaylyn Rong while in hospital and in the clinic. Autoimmune cause has been considered and ANA - negative. He was recently been seen by  rheumatologist- Dr. Nickola Major- and has a followup appointment with her on coming Friday. So for all admissions and workup has been unrevealing- no known cause of lymphadenopathy and the general feeling is that these symptoms are related to HIV as opposed to cancer like leukemia or MDS. His bone marrow's cytogenetics were negative. There has been an extended discussion regarding the cause of his fever episodes including infections, iatrogenic (Atripla can cause fevers) autoimmune disorders,malignancies and genetic disorders. Mr Amante has no history of recent travel outside [place]. He was adopted as a child and therefore he does not know his family history since the adoption was of 'closed' type. At this time the main interest is vasculitis Vs a paraprotein syndrome like monogammopathy of undetermined significancy. Vasculitis is being considered given recurrent AKI whenever he gets flares of his condition together with elevated CRP and ESR.  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 IgG = 2318, Serum free Kappa = 8.9, free lambda 12.9, with kappa/lambda ration - normal.  Urine free kappa 292, and free lambda = 5.38. IgA and IgM are normal. Immunofixation results are pending.  Serine protease is elevated at 107. Wil discuss with the team  about consulting hemo/onco. I discussed with Dr Nickola Major (rheumatology) about the new results and she feels we should proceed with renal biopsy to provide tissue diagnosis.  Plan  -  Urine Electrophoresis shows increased levels of the free kappa and lambda light chains in both serum and urine . Serum IgG levels are also elevated. Immunofixation - No monoclonal free light chains (Bence Jones Protein) are detected. Urine IFE shows polyclonal increase in free Kappa and/or free Lambda light chains. -Follow up blood cultures x2 - no growth  - Tylenol when necessary for fever and pain  Acute on chronic kidney disease : Baseline creatinine about 1.4-1.6.  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 Also had pyelonephritis in past. Patient does not seem to have UTI at present. Has granular casts in urine though. Patient hydrating himself at home with Gatorade but still c/o dark yellow urine. Unclear about the cause- but reflective of recent acute process ( as patient has worsening renal function with fever and lymphadenopathy in past). His history is significant for a renal stones. He has followed up with Dr Hyman Hopes but no medical records at this time. The main concern, again is a possibility of a vasculitis going together with is symptoms. Renal ultrasoung -1. Persistent splenomegaly. 2. 2.7 cm shadowing nonobstructing gallstone. There is no evidence for hydronephrosis. 3. Stable nonobstructive right upper pole kidney stone. His renal function has continued to improve with a creatinine of 1.28<1.99 today.  Plan  -Renal biopsy performed yesterday and results will be back some time next week. - continue with IVF and hold Atripla.  -monitor urine outpt  - FENa is 0.2 (<1%) which suggest prerenal cause of his acute renal insufficiency. This is likely from dehydration as he reports doing a lot of yard work on Tuesday which is which  is when the symptoms started.  - ANA, C3 and C4 complement level, antiglomerular basement membrane antibodies, antistreptolysin O titers are all negative -Noted kappa free light chains free light chains and lambda free light chains. This might explain renal insuffiency with with light chain deposition.  - We will continue with oral fluids. - Will do repeat US to rule out ascite - do  daily weights    Acute Pancytopenia - WBC today 3.3 , platelets 116.  This has been an issue during previous admissions. History not suggestive of GI or other source of blood loss.  Likely related to acute process. Been followed by hematologist. Bone marrow biopsy and lymph node biopsies unrevealing as in #1. Last ferritin level 685 in May 2013. In June 2013, cultures from bone marrow aspirate was still negative for AFB, fungal, viral. His bone marrow's cytogenetics were negative. Therefore his low blood counts and fever were thought to be most likely reactive. There was no concern for primary bone marrow disease such as leukemia, lymphoma, MDS. Dr Jethro Bolus (oncologist) recommended that he follows up with his primary doctor and HIV  He was seen by Dr Nickola Major, a rheumatologist who orders several tests including ESR 55, C-reactive protein 8.5, HLA-B27 negative, CCP antibodies, IgG - negative, rheumatoid factor less than 13, ANA negative, C3 complement 147, C4 compliment 27, uric acid 7.0, and ANCA panel -all Negative. TSH 3.85, testosterone 2.77. X-ray of hand to rule osteoarthritis - normal. He has a followup appointment on 06/10/2012.  Plan:  -We will monitor CBC. Mr Blancett has had similar pancytopenia with his last admissions and test to purse cause has not revealed any cause other than possible HIV related.  - Low hemo - there is a dropped by 2.7 points over his duration of hospitalisation - 4 days. I don't suspect acute hemorrhage but I think this might be due to the acute pancytopenia which bone marrow suppression.  -  Privigen immunoglobulin IV has been discontinued - Dr Gaylyn Rong recommends second opinion from [facility] upon discharging Mr Edgerly.  Absolute neutropenia: The patient has a neutropenia absolute count of 1.3. -Will consider a reverse isolation given that this neutrophil count of less than 1.5 portends a high risk for infection.  Cough: Patient has cough is which has been persistent and disturbing for the last couple of days. Chest exam is normal and not acute changes on CXR with two views  - continue with Tessalon   Anemia:  This could be related to the pancytopenia or hematemesis.  Note decreasing hemoglobin 8.0<7.6<7.9<8.0<9.5<12.1. Will continue with daily CBCs. FOBT - negative LDH - normal at , Haptoglobulin - elevated at 210 ,  Coombs test positive  ,reticulocytes count - low at 13.0 Iron low at <10, UIBC low at 119. Total iron binding capacity, and saturation cannot be calculated due to low iron. Hematology consults has been initiated to assist on the management of this anemia We will transfuse him with one unit of PRBC and repeat HH  Splenomegaly. Noted Ultrasound of abdomen on 9/10  with splenomegaly of 15.8 cm. Review of previous CT scan show similar splenomegaly as well 26.5cm in 10/2010. These findings might be related to the anemia, pancytopenia and lymphadenopathy.  -Repeat ultrasound does not show any ascites.  HIV- compliant with Atripla.Recent CD4 count 180<370< 210<110. VL remains <20. My only concern today is the issue to Atripla given Mr Caddock's changes in renal function and history of renal stones.  Plan   - Will consider an non-Tenofivir option.  - started on Dapsone again give low cd4 count after talking with Dr Orvan Falconer  - Awaiting results for HLA [id number] and susceptibility testing for intergrase inhibitors -ID will follow up as outpatient. Given that these results take a long time to come back (at least 2 weeks).  Hyponatremia - resolved: has been in low 130s over past few  months, further  decrease likely secondary to some volume depletion and hyperglycemia. Systolic blood pressure on lower side. He was not orthostatic. I suspect dehydration.  Sodium 127 - corrected sodium - 137 today Plan  -will monitor -with monitor since stopping the iv fluids  HTN- systolic blood pressure on lower side.  Plan  - Hold Labetolol- restart as tolerated.   DM 2: Hyperglycemia on admission-368 in setting of acute process  Last A1c 6.6 in May 2013 and repeat A1c shows 7.1% with elevated serum glucose.  Marland Kitchen He meets the diagnostic criteria for Diabetes.  - start Lantus 5 IU at bedtime with SSI with meals  DVT Px- Heparin.   Disposition: Patient will be discharged in the next one to 2 days. Will plan his followup after discharge.   Length of Stay: 8 days   Signed by:  Dow Adolph PGY-I, Internal Medicine Pager [phone] 06/13/2012, 2:05 PM

## 2012-06-13 NOTE — Progress Notes (Signed)
Internal Medicine Attending  Date: 06/13/2012  Patient name: Patrick [name] Medical record number: [id number] Date of birth: November 17, 1960 Age: 51 y.o. Gender: male  I saw and evaluated the patient. I reviewed the resident's note by Dr. Zada Girt and I agree with the resident's findings and plans as documented in his progress note.  Signed  Criselda Peaches, EMILY

## 2012-06-13 NOTE — Progress Notes (Signed)
INFECTIOUS DISEASE PROGRESS NOTE  ID: Patrick [name] is a 51 y.o. 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 acetaminophen  650 mg Oral QPM  . acetaminophen  650 mg Oral Q24H   And  . diphenhydrAMINE  50 mg Oral Q24H  . cyanocobalamin  1,000 mcg Intramuscular Q7 days  . dapsone  100 mg Oral Daily  . ferrous sulfate  325 mg Oral TID WC  . heparin  5,000 Units Subcutaneous Q8H  . insulin aspart  0-5 Units Subcutaneous QHS  . insulin aspart  0-9 Units Subcutaneous TID WC  . insulin glargine  5 Units Subcutaneous QHS    Objective: Vital signs in last 24 hours: Temp:  [98 F (36.7 C)-99.4 F (37.4 C)] 98.3 F (36.8 C) (09/16 1347) Pulse Rate:  [91-102] 102  (09/16 1347) Resp:  [18-21] 20  (09/16 1347) BP: (111-153)/(56-80) 153/80 mmHg (09/16 1347) SpO2:  [96 %-99 %] 97 % (09/16 1347) Weight:  [251 lb 5.2 oz (114 kg)] 251 lb 5.2 oz (114 kg) (09/15 2142)   General appearance: alert, cooperative and no distress Throat: abnormal findings: dentition: poor Resp: clear to auscultation bilaterally Cardio: regular rate and rhythm GI: normal findings: bowel sounds normal and soft, non-tender and abnormal findings:  distended Extremities: edema none  Lab Results  Basename 06/13/12 1312 06/13/12 0445 06/12/12 0515  WBC 3.2* 3.2* --  HGB 8.0* 8.3* --  HCT 24.3* 25.7* --  NA -- 133* 133*  K -- 4.0 3.9  CL -- 100 102  CO2 -- 27 24  BUN -- 17 15  CREATININE -- 1.25 1.28  GLU -- -- --   Liver  Panel  Basename 06/12/12 0515 06/11/12 0608  PROT 7.6 6.5  ALBUMIN 2.0* 2.0*  AST 11 11  ALT 7 9  ALKPHOS 43 42  BILITOT 0.2* 0.3  BILIDIR -- --  IBILI -- --   Sedimentation Rate No results found for this basename: ESRSEDRATE in the last 72 hours C-Reactive Protein No results found for this basename: CRP:2 in the last 72 hours  Microbiology: Recent Results (from the past 240 hour(s))  CULTURE, BLOOD (ROUTINE X 2)     Status: Normal   Collection Time   06/05/12 10:10 PM      Component Value Range Status Comment   Specimen Description BLOOD RIGHT ARM   Final    Special Requests BOTTLES DRAWN AEROBIC AND ANAEROBIC 10CC EACH   Final    Culture  Setup Time 06/06/2012 08:11   Final    Culture NO GROWTH 5 DAYS   Final    Report Status 06/12/2012 FINAL   Final   CULTURE, BLOOD (ROUTINE X 2)     Status: Normal   Collection Time   06/05/12 10:20 PM      Component Value Range Status Comment   Specimen Description BLOOD LEFT ARM   Final    Special Requests BOTTLES  DRAWN AEROBIC AND ANAEROBIC 10CC EACH   Final    Culture  Setup Time 06/06/2012 08:12   Final    Culture NO GROWTH 5 DAYS   Final    Report Status 06/12/2012 FINAL   Final   MRSA PCR SCREENING     Status: Normal   Collection Time   06/06/12  1:05 AM      Component Value Range Status Comment   MRSA by PCR NEGATIVE  NEGATIVE Final     Studies/Results: US Abdomen Limited  06/12/2012  *RADIOLOGY REPORT*  Clinical Data: Abdominal distension.  End-stage renal disease. Question ascites.  LIMITED ABDOMEN ULTRASOUND FOR ASCITES  Technique:  Limited ultrasound survey for ascites was performed in all four abdominal quadrants.  Comparison:  Abdominal ultrasound 06/07/2012.  Findings: Limited evaluation of all four quadrants was performed. No ascites is demonstrated.  IMPRESSION: No evidence of ascites.   Original Report Authenticated By: Gerrianne Scale, M.D.      Assessment/Plan: Recurrent Febrile Episodes HIV/AIDS Anemia  Await  results of his bx Await results of his integrase resistance testing Await results of [id number] (test for abacavir hypersensitivity).  Above blood tests may take up to 1 week to result, please have pt f/u with Dr Orvan Falconer in clinic.   Available as needed.  thanks  [name] Infectious Diseases [phone] 06/13/2012, 3:06 PM   LOS: 8 days

## 2012-06-14 ENCOUNTER — Encounter (HOSPITAL_COMMUNITY): Payer: Self-pay | Admitting: General Practice

## 2012-06-14 LAB — BASIC METABOLIC PANEL
CO2: 27 mEq/L (ref 19–32)
Chloride: 102 mEq/L (ref 96–112)
Creatinine, Ser: 1.14 mg/dL (ref 0.50–1.35)
GFR calc Af Amer: 85 mL/min — ABNORMAL LOW (ref >90)
GFR calc non Af Amer: 73 mL/min — ABNORMAL LOW (ref >90)
Glucose, Bld: 231 mg/dL — ABNORMAL HIGH (ref 70–99)
Potassium: 4.6 mEq/L (ref 3.5–5.1)
Sodium: 134 mEq/L — ABNORMAL LOW (ref 135–145)

## 2012-06-14 LAB — CBC
Hemoglobin: 8.2 g/dL — ABNORMAL LOW (ref 13.0–17.0)
MCH: 28.8 pg (ref 26.0–34.0)
Platelets: 134 10*3/uL — ABNORMAL LOW (ref 150–400)
RBC: 2.85 MIL/uL — ABNORMAL LOW (ref 4.22–5.81)
WBC: 3.2 10*3/uL — ABNORMAL LOW (ref 4.0–10.5)

## 2012-06-14 NOTE — Progress Notes (Signed)
Internal Medicine Attending  Date: 06/14/2012  Patient name: Patrick [name] Medical record number: [id number] Date of birth: 07-Feb-1961 Age: 51 y.o. Gender: male  I saw and evaluated the patient. I reviewed the resident's note by Dr. Zada Girt and I agree with the resident's findings and plans as documented in his progress note.

## 2012-06-14 NOTE — Progress Notes (Signed)
Subjective:    Interval Events:  He has been without fever for the last 5 days.Marland Kitchen He slept well last night. No new complaints. He received one unit of blood. Further history suggests that he started taking dapsone around March 2010, and the first episode of fever, started one year later. His been on dapsone since that time.     Objective:    Vital Signs:   Temp:  [98.1 F (36.7 C)-98.7 F (37.1 C)] 98.7 F (37.1 C) (09/17 1338) Pulse Rate:  [84-96] 92  (09/17 1338) Resp:  [19-21] 19  (09/17 1338) BP: (136-144)/(58-79) 136/68 mmHg (09/17 1338) SpO2:  [94 %-100 %] 97 % (09/17 1338) Weight:  [252 lb 3.3 oz (114.4 kg)] 252 lb 3.3 oz (114.4 kg) (09/16 2056) Last BM Date: 06/14/12   Weights: 24-hour Weight change: 14.1 oz (0.4 kg)  Filed Weights   06/11/12 2302 06/12/12 2142 06/13/12 2056  Weight: 250 lb 14.4 oz (113.807 kg) 251 lb 5.2 oz (114 kg) 252 lb 3.3 oz (114.4 kg)     Intake/Output:   Intake/Output Summary (Last 24 hours) at 06/14/12 1659 Last data filed at 06/14/12 1339  Gross per 24 hour  Intake    480 ml  Output      1 ml  Net    479 ml       Physical Exam: General appearance: alert, no distress and morbidly obese Head: Normocephalic, without obvious abnormality, atraumatic Cardio: regular rate and rhythm, S1, S2 normal, no murmur, click, rub or gallop GI: soft, non-tender; bowel sounds normal; no masses,  no organomegaly Extremities: extremities normal, atraumatic, no cyanosis or edema Lymph nodes: Cervical adenopathy: present bilaterally and Axillary adenopathy: present bilaterally  Neurologic: Grossly normal    Labs: Basic Metabolic Panel:  Lab 06/14/12 1610 06/13/12 0445 06/12/12 0515 06/11/12 0608 06/10/12 0515  NA 134* 133* 133* 137 134*  K 4.6 4.0 3.9 4.1 3.9  CL 102 100 102 105 103  CO2 27 27 24 25 23   GLUCOSE 231* 195* 172* 234* 180*  BUN 17 17 15 12 13   CREATININE 1.14 1.25 1.28 1.26 1.29  CALCIUM 8.6 8.6 8.3* -- --  MG -- -- -- --  --  PHOS -- -- -- -- --    Liver Function Tests:  Lab 06/12/12 0515 06/11/12 0608 06/10/12 0515 06/09/12 0615 06/08/12 1014  AST 11 11 12 14 14   ALT 7 9 10 11 10   ALKPHOS 43 42 43 42 47  BILITOT 0.2* 0.3 0.2* 0.2* 0.3  PROT 7.6 6.5 6.8 6.7 7.0  ALBUMIN 2.0* 2.0* 2.1* 2.1* 2.3*   No results found for this basename: LIPASE:5,AMYLASE:5 in the last 168 hours No results found for this basename: AMMONIA:3 in the last 168 hours  CBC:  Lab 06/14/12 0545 06/13/12 1312 06/13/12 0445 06/12/12 2113 06/12/12 0515 06/08/12 1548  WBC 3.2* 3.2* 3.2* 3.5* 3.3* --  NEUTROABS -- 1.3* -- -- -- 1.2*  HGB 8.2* 8.0* 8.3* 7.9* 7.3* --  HCT 25.7* 24.3* 25.7* 24.2* 22.6* --  MCV 90.2 89.3 89.9 89.0 89.3 --  PLT 134* 120* 123* 124* 116* --    Cardiac Enzymes: No results found for this basename: CKTOTAL:5,CKMB:5,CKMBINDEX:5,TROPONINI:5 in the last 168 hours  BNP: No components found with this basename: POCBNP:5  CBG:  Lab 06/14/12 1136 06/14/12 0721 06/13/12 2101 06/13/12 1644 06/13/12 1157  GLUCAP 198* 266* 188* 191* 174*    Coagulation Studies: No results found for this basename: LABPROT:5,INR:5 in the last 72  hours  Microbiology: Results for orders placed during the hospital encounter of 06/05/12  CULTURE, BLOOD (ROUTINE X 2)     Status: Normal   Collection Time   06/05/12 10:10 PM      Component Value Range Status Comment   Specimen Description BLOOD RIGHT ARM   Final    Special Requests BOTTLES DRAWN AEROBIC AND ANAEROBIC 10CC EACH   Final    Culture  Setup Time 06/06/2012 08:11   Final    Culture NO GROWTH 5 DAYS   Final    Report Status 06/12/2012 FINAL   Final   CULTURE, BLOOD (ROUTINE X 2)     Status: Normal   Collection Time   06/05/12 10:20 PM      Component Value Range Status Comment   Specimen Description BLOOD LEFT ARM   Final    Special Requests BOTTLES DRAWN AEROBIC AND ANAEROBIC 10CC EACH   Final    Culture  Setup Time 06/06/2012 08:12   Final    Culture NO GROWTH 5 DAYS    Final    Report Status 06/12/2012 FINAL   Final   MRSA PCR SCREENING     Status: Normal   Collection Time   06/06/12  1:05 AM      Component Value Range Status Comment   MRSA by PCR NEGATIVE  NEGATIVE Final     Other results:   Imaging: No results found.    Medications:    Infusions:     Scheduled Medications:    . acetaminophen  650 mg Oral QPM  . cyanocobalamin  1,000 mcg Intramuscular Q7 days  . ferrous sulfate  325 mg Oral TID WC  . heparin  5,000 Units Subcutaneous Q8H  . insulin aspart  0-5 Units Subcutaneous QHS  . insulin aspart  0-9 Units Subcutaneous TID WC  . insulin glargine  5 Units Subcutaneous QHS  . DISCONTD: dapsone  100 mg Oral Daily     PRN Medications: chlorpheniramine-HYDROcodone, ondansetron (ZOFRAN) IV, ondansetron, zolpidem   Assessment/ Plan:   Patrick [name] is a 51 year old male with a history of HIV (most recent CD4 count 370 on 05/05/12, VL undetectable), renal insufficiency with baseline creatinine~1.5, hypertension, recurrent lymphadenopathy and fever (since February 2012) thought to be benign reactive changes related to HIV. He presents to the ED with fever and lymphadenopathy since Tuesday. He has been admitted with similar symptoms in the past.   Fever and lymphadenopathy: Recurrent episodes with similar presentation. Likely seventh episode in last 1.5 years- with possible 5th admission. 2 axillary lymph is very is in an node biopsy and bone marrow biopsy unrevealing for malignancy or infections. left axillary node bx 11/24/11: Benign lymph node tissue with regressively transformed germinal centers, prominent vascularity and extensive plasmacytosis, architecture generally preserved. Bone marrow Bx 02/24/12: Mild absolute neutropenia, increased bands and toxic granulation. Moderate normocytic, normochromic anemia, no schistocytes, moderate thrombocytopenia. Patient with chronic splenomegaly. Likely HIV related lymphadenopathy.  Has been followed  by oncologist Dr. Gaylyn Rong while in hospital and in the clinic. Autoimmune cause has been considered and ANA - negative. He was recently been seen by rheumatologist- Dr. Nickola Major- and has a followup appointment with her on coming Friday. So for all admissions and workup has been unrevealing- no known cause of lymphadenopathy and the general feeling is that these symptoms are related to HIV as opposed to cancer like leukemia or MDS. His bone marrow's cytogenetics were negative. There has been an extended discussion regarding the cause of his fever  episodes including infections, iatrogenic (Atripla can cause fevers) autoimmune disorders,malignancies and genetic disorders. Patrick [name] has no history of recent travel outside [place]. He was adopted as a child and therefore he does not know his family history since the adoption was of 'closed' type. At this time the main interest is vasculitis Vs a paraprotein syndrome like monogammopathy of undetermined significancy. Vasculitis is being considered given recurrent AKI whenever he gets flares of his condition together with elevated CRP and ESR.  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 IgG = 2318, Serum free Kappa = 8.9, free lambda 12.9, with kappa/lambda ration - normal.  Urine free kappa 292, and free lambda = 5.38. IgA and IgM are normal. Immunofixation results are pending.  Serine protease is elevated at 107. Wil discuss with the team about consulting hemo/onco. I discussed with Dr Nickola Major (rheumatology) about the new results and she feels we should proceed with renal biopsy to provide tissue diagnosis. There has been no cause, and that Patrick [name] fevers could be a reaction from dapsone. It is noted that he started developing episodes of fevers one year after he had started dapsone. Plan  -  Urine Electrophoresis shows increased levels of the free kappa and lambda light chains in both serum  and urine . Serum IgG levels are also elevated. Immunofixation - No monoclonal free light chains (Bence Jones Protein) are detected. Urine IFE shows polyclonal increase in free Kappa and/or free Lambda light chains. -Follow up blood cultures x2 - no growth  - Tylenol when necessary for fever and pain -DC dapsone  Acute on chronic kidney disease : Baseline creatinine about 1.4-1.6.  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 Also had pyelonephritis in past. Patient does not seem to have UTI at present. Has granular casts in urine though. Patient hydrating himself at home with Gatorade but still c/o dark yellow urine. Unclear about the cause- but reflective of recent acute process ( as patient has worsening renal function with fever and lymphadenopathy in past). His history is significant for a renal stones. He has followed up with Dr Hyman Hopes but no medical records at this time. The main concern, again is a possibility of a vasculitis going together with is symptoms. Renal ultrasoung -1. Persistent splenomegaly. 2. 2.7 cm shadowing nonobstructing gallstone. There is no evidence for hydronephrosis. 3. Stable nonobstructive right upper pole kidney stone. His renal function has continued to improve with a creatinine of 1.28<1.99 today.  Plan  -Renal biopsy performed yesterday and results will be back some time next week. - continue with IVF and hold Atripla.  -monitor urine outpt  - FENa is 0.2 (<1%) which suggest prerenal cause of his acute renal insufficiency. This is likely from dehydration as he reports doing a lot of yard work on Tuesday which is which is when the symptoms started.  - ANA, C3 and C4 complement level, antiglomerular basement membrane antibodies, antistreptolysin O titers are all negative -Noted kappa free light chains free light chains and lambda free light chains. This might explain renal insuffiency  with with light chain deposition.  - We will continue with oral fluids. - do daily weights    Acute Pancytopenia - WBC today 3.3 , platelets 116.  This has been an issue during previous admissions. History not suggestive of GI or other source of blood loss.  Likely related to acute process.  Been followed by hematologist. Bone marrow biopsy and lymph node biopsies unrevealing as in #1. Last ferritin level 685 in May 2013. In June 2013, cultures from bone marrow aspirate was still negative for AFB, fungal, viral. His bone marrow's cytogenetics were negative. Therefore his low blood counts and fever were thought to be most likely reactive. There was no concern for primary bone marrow disease such as leukemia, lymphoma, MDS. Dr Jethro Bolus (oncologist) recommended that he follows up with his primary doctor and HIV  He was seen by Dr Nickola Major, a rheumatologist who orders several tests including ESR 55, C-reactive protein 8.5, HLA-B27 negative, CCP antibodies, IgG - negative, rheumatoid factor less than 13, ANA negative, C3 complement 147, C4 compliment 27, uric acid 7.0, and ANCA panel -all Negative. TSH 3.85, testosterone 2.77. X-ray of hand to rule osteoarthritis - normal. He has a followup appointment on 06/10/2012.  Plan:  -We will monitor CBC. Patrick [name] has had similar pancytopenia with his last admissions and test to purse cause has not revealed any cause other than possible HIV related.  - Low hemo - there is a dropped by 2.7 points over his duration of hospitalisation - 4 days. I don't suspect acute hemorrhage but I think this might be due to the acute pancytopenia which bone marrow suppression.  - Privigen immunoglobulin IV has been discontinued - Dr Gaylyn Rong recommends second opinion from [facility] upon discharging Patrick [name]. Patrick. [name] is at an increased risk of infection given his pancytopenia with absolute neutrophil count of 1.3. He will be counseled about this increased risk. He should avoid going into  crowded places, and if he has to, he must wear a mask to avoid touching infections given his reduced immunity. We will followup with the ID clinic, where his CBC will be done, and appropriate changes in her medication made.  Absolute neutropenia: The patient has a neutropenia absolute count of 1.3. -Will consider a reverse isolation given that this neutrophil count of less than 1.5 portends a high risk for infection.  Cough: Patient has cough is which has been persistent and disturbing for the last couple of days. Chest exam is normal and not acute changes on CXR with two views  - continue with Tessalon   Anemia:  This could be related to the pancytopenia or hematemesis.  Note decreasing hemoglobin 8.0<7.6<7.9<8.0<9.5<12.1. Will continue with daily CBCs. FOBT - negative LDH - normal at , Haptoglobulin - elevated at 210 ,  Coombs test positive  ,reticulocytes count - low at 13.0 Iron low at <10, UIBC low at 119. Total iron binding capacity, and saturation cannot be calculated due to low iron. Hematology consults has been initiated to assist on the management of this anemia We will transfuse him with one unit of PRBC and repeat HH  Splenomegaly. Noted Ultrasound of abdomen on 9/10  with splenomegaly of 15.8 cm. Review of previous CT scan show similar splenomegaly as well 26.5cm in 10/2010. These findings might be related to the anemia, pancytopenia and lymphadenopathy.  -Repeat ultrasound does not show any ascites.  HIV- compliant with Atripla.Recent CD4 count 180<370< 210<110. VL remains <20. My only concern today is the issue to Atripla given Patrick [name]'s changes in renal function and history of renal stones.  Plan   - Will consider an non-Tenofivir option.  - started on Dapsone again give low cd4 count after talking with Dr Orvan Falconer  - Awaiting results for HLA [id number] and susceptibility testing for intergrase inhibitors -ID will follow up as  outpatient. Given that these results take a long  time to come back (at least 2 weeks).  Hyponatremia - resolved: has been in low 130s over past few months, further decrease likely secondary to some volume depletion and hyperglycemia. Systolic blood pressure on lower side. He was not orthostatic. I suspect dehydration.  Sodium 127 - corrected sodium - 137 today Plan  -will monitor -with monitor since stopping the iv fluids  HTN- systolic blood pressure on lower side.  Plan  - Hold Labetolol- restart as tolerated.   DM 2: Hyperglycemia on admission-368 in setting of acute process  Last A1c 6.6 in May 2013 and repeat A1c shows 7.1% with elevated serum glucose.  Marland Kitchen He meets the diagnostic criteria for Diabetes.  - start Lantus 5 IU at bedtime with SSI with meals  DVT Px- Heparin.   Disposition: Patient will be discharged in the next one to 2 days. Will plan his followup after discharge. He will be discharged tomorrow.   Length of Stay: 9 days   Signed by:  Dow Adolph PGY-I, Internal Medicine Pager [phone] 06/14/2012, 4:59 PM               Subjective:    Interval Events:  He has been without fever for the last 6 days.Marland Kitchen He slept well last night. No new complaints. He received one unit of blood.     Objective:    Vital Signs:   Temp:  [98.1 F (36.7 C)-98.7 F (37.1 C)] 98.7 F (37.1 C) (09/17 1338) Pulse Rate:  [84-96] 92  (09/17 1338) Resp:  [19-21] 19  (09/17 1338) BP: (136-144)/(58-79) 136/68 mmHg (09/17 1338) SpO2:  [94 %-100 %] 97 % (09/17 1338) Weight:  [252 lb 3.3 oz (114.4 kg)] 252 lb 3.3 oz (114.4 kg) (09/16 2056) Last BM Date: 06/14/12   Weights: 24-hour Weight change: 14.1 oz (0.4 kg)  Filed Weights   06/11/12 2302 06/12/12 2142 06/13/12 2056  Weight: 250 lb 14.4 oz (113.807 kg) 251 lb 5.2 oz (114 kg) 252 lb 3.3 oz (114.4 kg)     Intake/Output:   Intake/Output Summary (Last 24 hours) at 06/14/12 1659 Last data filed at 06/14/12 1339  Gross per 24 hour  Intake    480 ml  Output       1 ml  Net    479 ml       Physical Exam: General appearance: alert, no distress and morbidly obese Head: Normocephalic, without obvious abnormality, atraumatic Cardio: regular rate and rhythm, S1, S2 normal, no murmur, click, rub or gallop GI: soft, non-tender; bowel sounds normal; no masses,  no organomegaly Extremities: extremities normal, atraumatic, no cyanosis or edema Lymph nodes: Cervical adenopathy: present bilaterally and Axillary adenopathy: present bilaterally  Neurologic: Grossly normal    Labs: Basic Metabolic Panel:  Lab 06/14/12 4540 06/13/12 0445 06/12/12 0515 06/11/12 0608 06/10/12 0515  NA 134* 133* 133* 137 134*  K 4.6 4.0 3.9 4.1 3.9  CL 102 100 102 105 103  CO2 27 27 24 25 23   GLUCOSE 231* 195* 172* 234* 180*  BUN 17 17 15 12 13   CREATININE 1.14 1.25 1.28 1.26 1.29  CALCIUM 8.6 8.6 8.3* -- --  MG -- -- -- -- --  PHOS -- -- -- -- --    Liver Function Tests:  Lab 06/12/12 0515 06/11/12 0608 06/10/12 0515 06/09/12 0615 06/08/12 1014  AST 11 11 12 14 14   ALT 7 9 10 11 10   ALKPHOS 43 42 43 42  47  BILITOT 0.2* 0.3 0.2* 0.2* 0.3  PROT 7.6 6.5 6.8 6.7 7.0  ALBUMIN 2.0* 2.0* 2.1* 2.1* 2.3*   No results found for this basename: LIPASE:5,AMYLASE:5 in the last 168 hours No results found for this basename: AMMONIA:3 in the last 168 hours  CBC:  Lab 06/14/12 0545 06/13/12 1312 06/13/12 0445 06/12/12 2113 06/12/12 0515 06/08/12 1548  WBC 3.2* 3.2* 3.2* 3.5* 3.3* --  NEUTROABS -- 1.3* -- -- -- 1.2*  HGB 8.2* 8.0* 8.3* 7.9* 7.3* --  HCT 25.7* 24.3* 25.7* 24.2* 22.6* --  MCV 90.2 89.3 89.9 89.0 89.3 --  PLT 134* 120* 123* 124* 116* --    Cardiac Enzymes: No results found for this basename: CKTOTAL:5,CKMB:5,CKMBINDEX:5,TROPONINI:5 in the last 168 hours  BNP: No components found with this basename: POCBNP:5  CBG:  Lab 06/14/12 1136 06/14/12 0721 06/13/12 2101 06/13/12 1644 06/13/12 1157  GLUCAP 198* 266* 188* 191* 174*    Coagulation  Studies: No results found for this basename: LABPROT:5,INR:5 in the last 72 hours  Microbiology: Results for orders placed during the hospital encounter of 06/05/12  CULTURE, BLOOD (ROUTINE X 2)     Status: Normal   Collection Time   06/05/12 10:10 PM      Component Value Range Status Comment   Specimen Description BLOOD RIGHT ARM   Final    Special Requests BOTTLES DRAWN AEROBIC AND ANAEROBIC 10CC EACH   Final    Culture  Setup Time 06/06/2012 08:11   Final    Culture NO GROWTH 5 DAYS   Final    Report Status 06/12/2012 FINAL   Final   CULTURE, BLOOD (ROUTINE X 2)     Status: Normal   Collection Time   06/05/12 10:20 PM      Component Value Range Status Comment   Specimen Description BLOOD LEFT ARM   Final    Special Requests BOTTLES DRAWN AEROBIC AND ANAEROBIC 10CC EACH   Final    Culture  Setup Time 06/06/2012 08:12   Final    Culture NO GROWTH 5 DAYS   Final    Report Status 06/12/2012 FINAL   Final   MRSA PCR SCREENING     Status: Normal   Collection Time   06/06/12  1:05 AM      Component Value Range Status Comment   MRSA by PCR NEGATIVE  NEGATIVE Final     Other results:   Imaging: No results found.    Medications:    Infusions:     Scheduled Medications:    . acetaminophen  650 mg Oral QPM  . cyanocobalamin  1,000 mcg Intramuscular Q7 days  . ferrous sulfate  325 mg Oral TID WC  . heparin  5,000 Units Subcutaneous Q8H  . insulin aspart  0-5 Units Subcutaneous QHS  . insulin aspart  0-9 Units Subcutaneous TID WC  . insulin glargine  5 Units Subcutaneous QHS  . DISCONTD: dapsone  100 mg Oral Daily     PRN Medications: chlorpheniramine-HYDROcodone, ondansetron (ZOFRAN) IV, ondansetron, zolpidem   Assessment/ Plan:   Patrick. Rees is a 51 year old male with a history of HIV (most recent CD4 count 370 on 05/05/12, VL undetectable), renal insufficiency with baseline creatinine~1.5, hypertension, recurrent lymphadenopathy and fever (since February 2012) thought  to be benign reactive changes related to HIV. He presents to the ED with fever and lymphadenopathy since Tuesday. He has been admitted with similar symptoms in the past.   Fever and lymphadenopathy: Recurrent episodes with similar presentation.  Likely seventh episode in last 1.5 years- with possible 5th admission. 2 axillary lymph is very is in an node biopsy and bone marrow biopsy unrevealing for malignancy or infections. left axillary node bx 11/24/11: Benign lymph node tissue with regressively transformed germinal centers, prominent vascularity and extensive plasmacytosis, architecture generally preserved. Bone marrow Bx 02/24/12: Mild absolute neutropenia, increased bands and toxic granulation. Moderate normocytic, normochromic anemia, no schistocytes, moderate thrombocytopenia. Patient with chronic splenomegaly. Likely HIV related lymphadenopathy.  Has been followed by oncologist Dr. Gaylyn Rong while in hospital and in the clinic. Autoimmune cause has been considered and ANA - negative. He was recently been seen by rheumatologist- Dr. Nickola Major- and has a followup appointment with her on coming Friday. So for all admissions and workup has been unrevealing- no known cause of lymphadenopathy and the general feeling is that these symptoms are related to HIV as opposed to cancer like leukemia or MDS. His bone marrow's cytogenetics were negative. There has been an extended discussion regarding the cause of his fever episodes including infections, iatrogenic (Atripla can cause fevers) autoimmune disorders,malignancies and genetic disorders. Patrick [name] has no history of recent travel outside [place]. He was adopted as a child and therefore he does not know his family history since the adoption was of 'closed' type. At this time the main interest is vasculitis Vs a paraprotein syndrome like monogammopathy of undetermined significancy. Vasculitis is being considered given recurrent AKI whenever he gets flares of his condition  together with elevated CRP and ESR.  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 IgG = 2318, Serum free Kappa = 8.9, free lambda 12.9, with kappa/lambda ration - normal.  Urine free kappa 292, and free lambda = 5.38. IgA and IgM are normal. Immunofixation results are pending.  Serine protease is elevated at 107. Wil discuss with the team about consulting hemo/onco. I discussed with Dr Nickola Major (rheumatology) about the new results and she feels we should proceed with renal biopsy to provide tissue diagnosis.  Plan  -  Urine Electrophoresis shows increased levels of the free kappa and lambda light chains in both serum and urine . Serum IgG levels are also elevated. Immunofixation - No monoclonal free light chains (Bence Jones Protein) are detected. Urine IFE shows polyclonal increase in free Kappa and/or free Lambda light chains. -Follow up blood cultures x2 - no growth  - Tylenol when necessary for fever and pain  Acute on chronic kidney disease : Baseline creatinine about 1.4-1.6.  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 Also had pyelonephritis in past. Patient does not seem to have UTI at present. Has granular casts in urine though. Patient hydrating himself at home with Gatorade but still c/o dark yellow urine. Unclear about the cause- but reflective of recent acute process ( as patient has worsening renal function with fever and lymphadenopathy in past). His history is significant for a renal stones. He has followed up with Dr Hyman Hopes but no medical records at this time. The main concern, again is a possibility of a vasculitis going together with is symptoms. Renal ultrasoung -1. Persistent splenomegaly. 2. 2.7 cm shadowing nonobstructing gallstone. There is no evidence for hydronephrosis. 3. Stable nonobstructive right upper pole kidney  stone. His renal function has continued to improve with a creatinine of 1.28<1.99 today.  Plan  -Renal biopsy performed yesterday and results will be back some time next week. -  continue with IVF and hold Atripla.  -monitor urine outpt  - FENa is 0.2 (<1%) which suggest prerenal cause of his acute renal insufficiency. This is likely from dehydration as he reports doing a lot of yard work on Tuesday which is which is when the symptoms started.  - ANA, C3 and C4 complement level, antiglomerular basement membrane antibodies, antistreptolysin O titers are all negative -Noted kappa free light chains free light chains and lambda free light chains. This might explain renal insuffiency with with light chain deposition.  - We will continue with oral fluids. - Will do repeat US to rule out ascite - do daily weights    Acute Pancytopenia - WBC today 3.3 , platelets 116.  This has been an issue during previous admissions. History not suggestive of GI or other source of blood loss.  Likely related to acute process. Been followed by hematologist. Bone marrow biopsy and lymph node biopsies unrevealing as in #1. Last ferritin level 685 in May 2013. In June 2013, cultures from bone marrow aspirate was still negative for AFB, fungal, viral. His bone marrow's cytogenetics were negative. Therefore his low blood counts and fever were thought to be most likely reactive. There was no concern for primary bone marrow disease such as leukemia, lymphoma, MDS. Dr Jethro Bolus (oncologist) recommended that he follows up with his primary doctor and HIV  He was seen by Dr Nickola Major, a rheumatologist who orders several tests including ESR 55, C-reactive protein 8.5, HLA-B27 negative, CCP antibodies, IgG - negative, rheumatoid factor less than 13, ANA negative, C3 complement 147, C4 compliment 27, uric acid 7.0, and ANCA panel -all Negative. TSH 3.85, testosterone 2.77. X-ray of hand to rule osteoarthritis - normal. He has a followup  appointment on 06/10/2012.  Plan:  -We will monitor CBC. Patrick [name] has had similar pancytopenia with his last admissions and test to purse cause has not revealed any cause other than possible HIV related.  - Low hemo - there is a dropped by 2.7 points over his duration of hospitalisation - 4 days. I don't suspect acute hemorrhage but I think this might be due to the acute pancytopenia which bone marrow suppression.  - Privigen immunoglobulin IV has been discontinued - Dr Gaylyn Rong recommends second opinion from [facility] upon discharging Patrick [name].  Absolute neutropenia: The patient has a neutropenia absolute count of 1.3. -Will consider a reverse isolation given that this neutrophil count of less than 1.5 portends a high risk for infection.  Cough: Patient has cough is which has been persistent and disturbing for the last couple of days. Chest exam is normal and not acute changes on CXR with two views  - continue with Tessalon   Anemia:  This could be related to the pancytopenia or hematemesis.  Note decreasing hemoglobin 8.0<7.6<7.9<8.0<9.5<12.1. Will continue with daily CBCs. FOBT - negative LDH - normal at , Haptoglobulin - elevated at 210 ,  Coombs test positive  ,reticulocytes count - low at 13.0 Iron low at <10, UIBC low at 119. Total iron binding capacity, and saturation cannot be calculated due to low iron. Hematology consults has been initiated to assist on the management of this anemia We will transfuse him with one unit of PRBC and repeat HH  Splenomegaly. Noted Ultrasound of abdomen on 9/10  with splenomegaly of 15.8 cm. Review of previous CT scan show similar splenomegaly as well 26.5cm in 10/2010. These findings might be related to the anemia, pancytopenia and lymphadenopathy.  -Repeat ultrasound does not show  any ascites.  HIV- compliant with Atripla.Recent CD4 count 180<370< 210<110. VL remains <20. My only concern today is the issue to Atripla given Patrick [name]'s changes in renal  function and history of renal stones.  Plan   - Will consider an non-Tenofivir option.  - started on Dapsone again give low cd4 count after talking with Dr Orvan Falconer  - Awaiting results for HLA [id number] and susceptibility testing for intergrase inhibitors -ID will follow up as outpatient. Given that these results take a long time to come back (at least 2 weeks).  Hyponatremia - resolved: has been in low 130s over past few months, further decrease likely secondary to some volume depletion and hyperglycemia. Systolic blood pressure on lower side. He was not orthostatic. I suspect dehydration.  Sodium 127 - corrected sodium - 137 today Plan  -will monitor -with monitor since stopping the iv fluids  HTN- systolic blood pressure on lower side.  Plan  - Hold Labetolol- restart as tolerated.   DM 2: Hyperglycemia on admission-368 in setting of acute process  Last A1c 6.6 in May 2013 and repeat A1c shows 7.1% with elevated serum glucose.  Marland Kitchen He meets the diagnostic criteria for Diabetes.  - start Lantus 5 IU at bedtime with SSI with meals  DVT Px- Heparin.   Disposition: Patient will be discharged in the next one to 2 days. Will plan his followup after discharge.   Length of Stay: 9 days   Signed by:  Dow Adolph PGY-I, Internal Medicine Pager [phone] 06/14/2012, 4:59 PM

## 2012-06-14 NOTE — Progress Notes (Addendum)
INFECTIOUS DISEASE PROGRESS NOTE  ID: Patrick [name] is a 51 y.o. 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 acetaminophen  650 mg Oral QPM  . cyanocobalamin  1,000 mcg Intramuscular Q7 days  . dapsone  100 mg Oral Daily  . ferrous sulfate  325 mg Oral TID WC  . heparin  5,000 Units Subcutaneous Q8H  . insulin aspart  0-5 Units Subcutaneous QHS  . insulin aspart  0-9 Units Subcutaneous TID WC  . insulin glargine  5 Units Subcutaneous QHS    Objective: Vital signs in last 24 hours: Temp:  [98.1 F (36.7 C)-98.7 F (37.1 C)] 98.7 F (37.1 C) (09/17 1338) Pulse Rate:  [84-96] 92  (09/17 1338) Resp:  [19-21] 19  (09/17 1338) BP: (136-144)/(58-79) 136/68 mmHg (09/17 1338) SpO2:  [94 %-100 %] 97 % (09/17 1338) Weight:  [114.4 kg (252 lb 3.3 oz)] 114.4 kg (252 lb 3.3 oz) (09/16 2056)   General appearance: alert, cooperative and no distress Resp: clear to auscultation bilaterally Cardio: regular rate and rhythm GI: normal findings: bowel sounds normal and soft, non-tender and abnormal findings:  distended Extremities: edema none  Lab Results  Basename 06/14/12 0545 06/13/12 1312 06/13/12 0445  WBC 3.2* 3.2* --  HGB 8.2* 8.0* --  HCT 25.7* 24.3* --  NA 134* -- 133*  K 4.6 -- 4.0  CL 102 -- 100  CO2 27 -- 27  BUN 17 -- 17  CREATININE 1.14 -- 1.25  GLU -- -- --   Liver Panel  Basename 06/12/12 0515  PROT 7.6  ALBUMIN 2.0*  AST 11  ALT 7  ALKPHOS 43  BILITOT 0.2*  BILIDIR --  IBILI --    Sedimentation Rate No results found for this basename: ESRSEDRATE in the last 72 hours C-Reactive Protein No results found for this basename: CRP:2 in the last 72 hours  Microbiology: Recent Results (from the past 240 hour(s))  CULTURE, BLOOD (ROUTINE X 2)     Status: Normal   Collection Time   06/05/12 10:10 PM      Component Value Range Status Comment   Specimen Description BLOOD RIGHT ARM   Final    Special Requests BOTTLES DRAWN AEROBIC AND ANAEROBIC 10CC EACH   Final    Culture  Setup Time 06/06/2012 08:11   Final    Culture NO GROWTH 5 DAYS   Final    Report Status 06/12/2012 FINAL   Final   CULTURE, BLOOD (ROUTINE X 2)     Status: Normal   Collection Time   06/05/12 10:20 PM      Component Value Range Status Comment   Specimen Description BLOOD LEFT ARM   Final    Special Requests BOTTLES DRAWN AEROBIC AND ANAEROBIC 10CC EACH   Final    Culture  Setup Time 06/06/2012 08:12   Final    Culture NO GROWTH 5 DAYS   Final    Report  Status 06/12/2012 FINAL   Final   MRSA PCR SCREENING     Status: Normal   Collection Time   06/06/12  1:05 AM      Component Value Range Status Comment   MRSA by PCR NEGATIVE  NEGATIVE Final     Studies/Results: No results found.   Assessment/Plan: Recurrent Febrile Episodes  HIV/AIDS  Anemia  Await results of his bx, should be reviewed in next 24 hours at [facility]. Also sent to [facility], this result should be back by Friday (9-20)  ARF improved.  Agree with d/c the dapsone. His syndrome could be related to this as well, though would seem less likely than his HIV meds.  Await results of his integrase resistance testing  Await results of [id number] (test for abacavir hypersensitivity).  Above blood tests may take up to 1 week to result, please have pt f/u with Dr Orvan Falconer in clinic.     [name] Infectious Diseases [phone] 06/14/2012, 4:22 PM   LOS: 9 days

## 2012-06-15 ENCOUNTER — Other Ambulatory Visit: Payer: Self-pay | Admitting: Oncology

## 2012-06-15 ENCOUNTER — Encounter: Payer: Self-pay | Admitting: Oncology

## 2012-06-15 DIAGNOSIS — R599 Enlarged lymph nodes, unspecified: Secondary | ICD-10-CM

## 2012-06-15 DIAGNOSIS — B20 Human immunodeficiency virus [HIV] disease: Secondary | ICD-10-CM

## 2012-06-15 DIAGNOSIS — D61818 Other pancytopenia: Secondary | ICD-10-CM

## 2012-06-15 LAB — HLA B*5701: HLA B 5701: NEGATIVE

## 2012-06-15 LAB — CBC
HCT: 25.3 % — ABNORMAL LOW (ref 39.0–52.0)
MCHC: 32.4 g/dL (ref 30.0–36.0)
MCV: 92 fL (ref 78.0–100.0)
RDW: 15.2 % (ref 11.5–15.5)
WBC: 3.4 10*3/uL — ABNORMAL LOW (ref 4.0–10.5)

## 2012-06-15 LAB — GLUCOSE, CAPILLARY: Glucose-Capillary: 154 mg/dL — ABNORMAL HIGH (ref 70–99)

## 2012-06-15 LAB — BASIC METABOLIC PANEL
BUN: 14 mg/dL (ref 6–23)
Chloride: 103 mEq/L (ref 96–112)
Creatinine, Ser: 1.23 mg/dL (ref 0.50–1.35)
GFR calc Af Amer: 78 mL/min — ABNORMAL LOW (ref >90)

## 2012-06-15 MED ORDER — HYDROCOD POLST-CHLORPHEN POLST 10-8 MG/5ML PO LQCR: 5.0000 mL | Freq: Two times a day (BID) | ORAL | Status: DC | PRN

## 2012-06-15 MED ORDER — ZOLPIDEM TARTRATE 5 MG PO TABS: 5.0000 mg | ORAL_TABLET | Freq: Every evening | ORAL | Status: DC | PRN

## 2012-06-15 NOTE — Progress Notes (Signed)
Subjective:    Interval Events:  He has been without fever for the last 5 days.Patrick [name] He slept well last night. No new complaints. He received one unit of blood. Further history suggests that he started taking dapsone around March 2010, and the first episode of fever, started one year later. His been on dapsone since that time.     Objective:    Vital Signs:   Temp:  [98 F (36.7 C)-98.4 F (36.9 C)] 98.4 F (36.9 C) (09/18 1335) Pulse Rate:  [88-92] 88  (09/18 1335) Resp:  [19-20] 20  (09/18 1335) BP: (138-154)/(74-89) 138/74 mmHg (09/18 1335) SpO2:  [95 %-99 %] 99 % (09/18 1335) Weight:  [252 lb 3.3 oz (114.4 kg)] 252 lb 3.3 oz (114.4 kg) (09/17 2052) Last BM Date: 06/14/12 (per patient report)   Weights: 24-hour Weight change: 0 lb (0 kg)  Filed Weights   06/12/12 2142 06/13/12 2056 06/14/12 2052  Weight: 251 lb 5.2 oz (114 kg) 252 lb 3.3 oz (114.4 kg) 252 lb 3.3 oz (114.4 kg)     Intake/Output:   Intake/Output Summary (Last 24 hours) at 06/15/12 2047 Last data filed at 06/15/12 1335  Gross per 24 hour  Intake    360 ml  Output      0 ml  Net    360 ml       Physical Exam: General appearance: alert, no distress and morbidly obese Head: Normocephalic, without obvious abnormality, atraumatic Cardio: regular rate and rhythm, S1, S2 normal, no murmur, click, rub or gallop GI: soft, non-tender; bowel sounds normal; no masses,  no organomegaly Extremities: extremities normal, atraumatic, no cyanosis or edema Lymph nodes: Cervical adenopathy: present bilaterally and Axillary adenopathy: present bilaterally  Neurologic: Grossly normal    Labs: Basic Metabolic Panel:  Lab 06/15/12 4098 06/14/12 0545 06/13/12 0445 06/12/12 0515 06/11/12 0608  NA 136 134* 133* 133* 137  K 4.2 4.6 4.0 3.9 4.1  CL 103 102 100 102 105  CO2 28 27 27 24 25   GLUCOSE 209* 231* 195* 172* 234*  BUN 14 17 17 15 12   CREATININE 1.23 1.14 1.25 1.28 1.26  CALCIUM 8.8 8.6 8.6 -- --  MG -- --  -- -- --  PHOS -- -- -- -- --    Liver Function Tests:  Lab 06/12/12 0515 06/11/12 0608 06/10/12 0515 06/09/12 0615  AST 11 11 12 14   ALT 7 9 10 11   ALKPHOS 43 42 43 42  BILITOT 0.2* 0.3 0.2* 0.2*  PROT 7.6 6.5 6.8 6.7  ALBUMIN 2.0* 2.0* 2.1* 2.1*   No results found for this basename: LIPASE:5,AMYLASE:5 in the last 168 hours No results found for this basename: AMMONIA:3 in the last 168 hours  CBC:  Lab 06/15/12 0620 06/14/12 0545 06/13/12 1312 06/13/12 0445 06/12/12 2113  WBC 3.4* 3.2* 3.2* 3.2* 3.5*  NEUTROABS -- -- 1.3* -- --  HGB 8.2* 8.2* 8.0* 8.3* 7.9*  HCT 25.3* 25.7* 24.3* 25.7* 24.2*  MCV 92.0 90.2 89.3 89.9 89.0  PLT 149* 134* 120* 123* 124*    Cardiac Enzymes: No results found for this basename: CKTOTAL:5,CKMB:5,CKMBINDEX:5,TROPONINI:5 in the last 168 hours  BNP: No components found with this basename: POCBNP:5  CBG:  Lab 06/15/12 1157 06/15/12 0730 06/14/12 2056 06/14/12 1721 06/14/12 1136  GLUCAP 154* 167* 161* 197* 198*    Coagulation Studies: No results found for this basename: LABPROT:5,INR:5 in the last 72 hours  Microbiology: Results for orders placed during the hospital encounter of 06/05/12  CULTURE, BLOOD (ROUTINE X 2)     Status: Normal   Collection Time   06/05/12 10:10 PM      Component Value Range Status Comment   Specimen Description BLOOD RIGHT ARM   Final    Special Requests BOTTLES DRAWN AEROBIC AND ANAEROBIC 10CC EACH   Final    Culture  Setup Time 06/06/2012 08:11   Final    Culture NO GROWTH 5 DAYS   Final    Report Status 06/12/2012 FINAL   Final   CULTURE, BLOOD (ROUTINE X 2)     Status: Normal   Collection Time   06/05/12 10:20 PM      Component Value Range Status Comment   Specimen Description BLOOD LEFT ARM   Final    Special Requests BOTTLES DRAWN AEROBIC AND ANAEROBIC 10CC EACH   Final    Culture  Setup Time 06/06/2012 08:12   Final    Culture NO GROWTH 5 DAYS   Final    Report Status 06/12/2012 FINAL   Final   MRSA PCR  SCREENING     Status: Normal   Collection Time   06/06/12  1:05 AM      Component Value Range Status Comment   MRSA by PCR NEGATIVE  NEGATIVE Final     Other results:   Imaging: No results found.    Medications:    Infusions:     Scheduled Medications:    . DISCONTD: acetaminophen  650 mg Oral QPM  . DISCONTD: cyanocobalamin  1,000 mcg Intramuscular Q7 days  . DISCONTD: ferrous sulfate  325 mg Oral TID WC  . DISCONTD: heparin  5,000 Units Subcutaneous Q8H  . DISCONTD: insulin aspart  0-5 Units Subcutaneous QHS  . DISCONTD: insulin aspart  0-9 Units Subcutaneous TID WC  . DISCONTD: insulin glargine  5 Units Subcutaneous QHS     PRN Medications: DISCONTD: chlorpheniramine-HYDROcodone, DISCONTD: ondansetron (ZOFRAN) IV, DISCONTD: ondansetron, DISCONTD: zolpidem   Assessment/ Plan:   Patrick [name] is a 51 year old male with a history of HIV (most recent CD4 count 370 on 05/05/12, VL undetectable), renal insufficiency with baseline creatinine~1.5, hypertension, recurrent lymphadenopathy and fever (since February 2012) thought to be benign reactive changes related to HIV. He presents to the ED with fever and lymphadenopathy since Tuesday. He has been admitted with similar symptoms in the past.   Fever and lymphadenopathy: Recurrent episodes with similar presentation. Likely seventh episode in last 1.5 years- with possible 5th admission. 2 axillary lymph is very is in an node biopsy and bone marrow biopsy unrevealing for malignancy or infections. left axillary node bx 11/24/11: Benign lymph node tissue with regressively transformed germinal centers, prominent vascularity and extensive plasmacytosis, architecture generally preserved. Bone marrow Bx 02/24/12: Mild absolute neutropenia, increased bands and toxic granulation. Moderate normocytic, normochromic anemia, no schistocytes, moderate thrombocytopenia. Patient with chronic splenomegaly. Likely HIV related lymphadenopathy.  Has been  followed by oncologist Dr. Gaylyn Rong while in hospital and in the clinic. Autoimmune cause has been considered and ANA - negative. He was recently been seen by rheumatologist- Dr. Nickola Major- and has a followup appointment with her on coming Friday. So for all admissions and workup has been unrevealing- no known cause of lymphadenopathy and the general feeling is that these symptoms are related to HIV as opposed to cancer like leukemia or MDS. His bone marrow's cytogenetics were negative. There has been an extended discussion regarding the cause of his fever episodes including infections, iatrogenic (Atripla can cause fevers) autoimmune disorders,malignancies and genetic  disorders. Mr Grime has no history of recent travel outside [place]. He was adopted as a child and therefore he does not know his family history since the adoption was of 'closed' type. At this time the main interest is vasculitis Vs a paraprotein syndrome like monogammopathy of undetermined significancy. Vasculitis is being considered given recurrent AKI whenever he gets flares of his condition together with elevated CRP and ESR.  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 IgG = 2318, Serum free Kappa = 8.9, free lambda 12.9, with kappa/lambda ration - normal.  Urine free kappa 292, and free lambda = 5.38. IgA and IgM are normal. Immunofixation results are pending.  Serine protease is elevated at 107. Wil discuss with the team about consulting hemo/onco. I discussed with Dr Nickola Major (rheumatology) about the new results and she feels we should proceed with renal biopsy to provide tissue diagnosis. There has been no cause, and that Mr. Kurtzman fevers could be a reaction from dapsone. It is noted that he started developing episodes of fevers one year after he had started dapsone. Plan  -  Urine Electrophoresis shows increased levels of the free kappa and lambda light chains in  both serum and urine . Serum IgG levels are also elevated. Immunofixation - No monoclonal free light chains (Bence Jones Protein) are detected. Urine IFE shows polyclonal increase in free Kappa and/or free Lambda light chains. -Follow up blood cultures x2 - no growth  - Tylenol when necessary for fever and pain -DC dapsone  Acute on chronic kidney disease : Baseline creatinine about 1.4-1.6.  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 Also had pyelonephritis in past. Patient does not seem to have UTI at present. Has granular casts in urine though. Patient hydrating himself at home with Gatorade but still c/o dark yellow urine. Unclear about the cause- but reflective of recent acute process ( as patient has worsening renal function with fever and lymphadenopathy in past). His history is significant for a renal stones. He has followed up with Dr Hyman Hopes but no medical records at this time. The main concern, again is a possibility of a vasculitis going together with is symptoms. Renal ultrasoung -1. Persistent splenomegaly. 2. 2.7 cm shadowing nonobstructing gallstone. There is no evidence for hydronephrosis. 3. Stable nonobstructive right upper pole kidney stone. His renal function has continued to improve with a creatinine of 1.28<1.99 today.  Plan  -Renal biopsy performed yesterday and results will be back some time next week. - continue with IVF and hold Atripla.  -monitor urine outpt  - FENa is 0.2 (<1%) which suggest prerenal cause of his acute renal insufficiency. This is likely from dehydration as he reports doing a lot of yard work on Tuesday which is which is when the symptoms started.  - ANA, C3 and C4 complement level, antiglomerular basement membrane antibodies, antistreptolysin O titers are all negative -Noted kappa free light chains free light chains and lambda free light chains. This might explain renal  insuffiency with with light chain deposition.  - We will continue with oral fluids. - do daily weights    Acute Pancytopenia - WBC today 3.3 , platelets 116.  This has been an issue during previous admissions. History not suggestive of GI or other source of blood loss.  Likely related to acute process. Been followed by hematologist. Bone marrow biopsy and lymph node biopsies unrevealing  as in #1. Last ferritin level 685 in May 2013. In June 2013, cultures from bone marrow aspirate was still negative for AFB, fungal, viral. His bone marrow's cytogenetics were negative. Therefore his low blood counts and fever were thought to be most likely reactive. There was no concern for primary bone marrow disease such as leukemia, lymphoma, MDS. Dr Jethro Bolus (oncologist) recommended that he follows up with his primary doctor and HIV  He was seen by Dr Nickola Major, a rheumatologist who orders several tests including ESR 55, C-reactive protein 8.5, HLA-B27 negative, CCP antibodies, IgG - negative, rheumatoid factor less than 13, ANA negative, C3 complement 147, C4 compliment 27, uric acid 7.0, and ANCA panel -all Negative. TSH 3.85, testosterone 2.77. X-ray of hand to rule osteoarthritis - normal. He has a followup appointment on 06/10/2012.  Plan:  -We will monitor CBC. Mr Baik has had similar pancytopenia with his last admissions and test to purse cause has not revealed any cause other than possible HIV related.  - Low hemo - there is a dropped by 2.7 points over his duration of hospitalisation - 4 days. I don't suspect acute hemorrhage but I think this might be due to the acute pancytopenia which bone marrow suppression.  - Privigen immunoglobulin IV has been discontinued - Dr Gaylyn Rong recommends second opinion from [facility] upon discharging Mr Janelle. Mr. Scronce is at an increased risk of infection given his pancytopenia with absolute neutrophil count of 1.3. He will be counseled about this increased risk. He should avoid  going into crowded places, and if he has to, he must wear a mask to avoid touching infections given his reduced immunity. We will followup with the ID clinic, where his CBC will be done, and appropriate changes in her medication made.  Absolute neutropenia: The patient has a neutropenia absolute count of 1.3. -Will consider a reverse isolation given that this neutrophil count of less than 1.5 portends a high risk for infection.  Cough: Patient has cough is which has been persistent and disturbing for the last couple of days. Chest exam is normal and not acute changes on CXR with two views  - continue with Tessalon   Anemia:  This could be related to the pancytopenia or hematemesis.  Note decreasing hemoglobin 8.0<7.6<7.9<8.0<9.5<12.1. Will continue with daily CBCs. FOBT - negative LDH - normal at , Haptoglobulin - elevated at 210 ,  Coombs test positive  ,reticulocytes count - low at 13.0 Iron low at <10, UIBC low at 119. Total iron binding capacity, and saturation cannot be calculated due to low iron. Hematology consults has been initiated to assist on the management of this anemia We will transfuse him with one unit of PRBC and repeat HH  Splenomegaly. Noted Ultrasound of abdomen on 9/10  with splenomegaly of 15.8 cm. Review of previous CT scan show similar splenomegaly as well 26.5cm in 10/2010. These findings might be related to the anemia, pancytopenia and lymphadenopathy.  -Repeat ultrasound does not show any ascites.  HIV- compliant with Atripla.Recent CD4 count 180<370< 210<110. VL remains <20. My only concern today is the issue to Atripla given Mr Caddock's changes in renal function and history of renal stones.  Plan   - Will consider an non-Tenofivir option.  - started on Dapsone again give low cd4 count after talking with Dr Orvan Falconer  - Awaiting results for HLA B5701 and susceptibility testing for intergrase inhibitors -ID will follow up as outpatient. Given that these results  take a long time to come  back (at least 2 weeks).  Hyponatremia - resolved: has been in low 130s over past few months, further decrease likely secondary to some volume depletion and hyperglycemia. Systolic blood pressure on lower side. He was not orthostatic. I suspect dehydration.  Sodium 127 - corrected sodium - 137 today Plan  -will monitor -with monitor since stopping the iv fluids  HTN- systolic blood pressure on lower side.  Plan  - Hold Labetolol- restart as tolerated.   DM 2: Hyperglycemia on admission-368 in setting of acute process  Last A1c 6.6 in May 2013 and repeat A1c shows 7.1% with elevated serum glucose.  Patrick [name] He meets the diagnostic criteria for Diabetes.  - start Lantus 5 IU at bedtime with SSI with meals  DVT Px- Heparin.   Disposition: Patient will be discharged in the next one to 2 days. Will plan his followup after discharge. He will be discharged tomorrow.   Length of Stay: 10 days   Signed by:  Dow Adolph PGY-I, Internal Medicine Pager [phone] 06/15/2012, 8:47 PM

## 2012-06-15 NOTE — Discharge Summary (Signed)
Internal Medicine Teaching [facility] Discharge Note  Name: Patrick [name] MRN: [id number] DOB: 1961-01-24 51 y.o.  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 Takes on Monday      dapsone 100 MG tablet   Take 100 mg by mouth every morning.      efavirenz-emtricitabine-tenofovir 600-200-300 MG per tablet   Commonly known as: ATRIPLA   Take 1 tablet by mouth at bedtime.      ferrous sulfate 325 (65 FE) MG tablet   Take 1 tablet (325 mg total) by mouth 3 (three) times daily with meals.      labetalol 200 MG tablet   Commonly known as: NORMODYNE   Take 200 mg by mouth 2 (two) times daily.        Disposition and follow-up:   PatrickPatrick [name] was discharged from [facility] in stable condition.    At the hospital follow up visit please address:  PCP  Please encourage Patrick [name] to followup with hematology consult. His discharge. Hb was 8.2. These should be checked. There is a plan by Dr. Gaylyn Rong to refer the patient to [facility].  Please discuss with Patrick [name] about treatment for diabetes. He will need close followup since his wishing to delay treatment with oral hypoglycemics. He has been counseled on lifestyle changes including exercise and diet control.  Infectious disease Please note Dapsone has been discontinued as this  has been felt to be contributing to some form of drug sensitivity. He is currently not on any prophylaxis for PCP. Dr. Ninetta Lights and Dr. Orvan Falconer have been involved, and agree to withhold dapsone. The patient has sulfonamide allergy. Further evaluation should be done during outpatient followup.   Patrick [name] has been compliant with Atripla for the last 3 years with good virological and immunological response. However, it appears tenofovir is the main  contributing factor to the kidney injury. Results of [id number] and intergrase inhibitor susceptibility pending at the time of discharge.    Follow-up Appointments:     Follow-up Information    Follow up with Cliffton Asters, MD. On 06/23/2012. (at 11:00am )    Contact information:   [address] [place] [address] [phone]         Discharge Orders    Future Appointments: Provider: Department: Dept Phone: Center:   06/23/2012 11:00 AM Cliffton Asters, MD [facility] [id number] [facility]      Consultations: Oncology [ Exie Parody, MD] Infectious diseases. Nephrology.  Procedures Performed:  Dg Chest 2 View  06/11/2012  *RADIOLOGY REPORT*  Clinical Data: Shortness of breath.  Possible vasculitis.  HIV. Hypertension.  CHEST - 2 VIEW  Comparison: 06/10/2012  Findings: Cardiac and mediastinal contours appear unremarkable.  Linear retrocardiac opacities most attributable to vasculature. Trace thickening of the left major fissure on the  lateral projection.  Posterior pleural thickening bilaterally suggests tiny bilateral pleural effusions, although there is no overt blunting of the costophrenic angles.  No airway thickening noted.  IMPRESSION:  1.  Suspected trace bilateral pleural effusions.   Otherwise, no significant abnormality identified.   Original Report Authenticated By: Dellia Cloud, M.D.    Dg Chest 2 View  06/05/2012  *RADIOLOGY REPORT*  Clinical Data: Fever.  Cough.  Swollen lymph nodes.  Chest pain   CHEST - 2 VIEW  Comparison: 02/22/2012.  Findings: Slightly shallow inspiration. The heart size and pulmonary vascularity are normal. The lungs appear clear and expanded without focal air space disease or consolidation. No blunting of the costophrenic angles.  No pneumothorax.  Mediastinal contours appear intact.  No significant changes since previous study.  Surgical clips in the left axilla.  IMPRESSION: No evidence of active pulmonary disease.   Original Report Authenticated By: Marlon Pel, M.D.    US Abdomen Complete  06/07/2012  *RADIOLOGY REPORT*  Clinical Data:  Acute renal failure.  History kidney stones.  HIV with lymphadenopathy, splenomegaly, and fevers.  COMPLETE ABDOMINAL ULTRASOUND  Comparison:  Renal ultrasound 02/23/2012.  CT of the abdomen pelvis without contrast 11/22/2010.  Findings:  Gallbladder:  A 2.7 cm shadowing stone is stable.  There is no evidence for cholecystitis.  The gallbladder wall thickness is within normal limits at 2 mm.  Common bile duct:  Normal in caliber. No biliary ductal dilation.The maximal diameter is 3 mm.  Liver:  No focal lesion identified.  Within normal limits in parenchymal echogenicity.  IVC:  Appears normal.  Pancreas:  Although the pancreas is difficult to visualize in its entirety, no focal pancreatic abnormality is identified.  Spleen:  Spleen is markedly enlarged, measuring at least 15.8 cm. It is difficult to that the entire spleen lung field of view.  Right Kidney:  85 mm nonobstructing calculus at the upper pole of the right kidney is stable.  There is no hydronephrosis.  The right kidney length is 14.4 cm.  Left Kidney:  No hydronephrosis.  Well-preserved cortex.  Normal size and parenchymal echotexture without focal abnormalities. The maximal length is 10.2 cm, stable.  Abdominal aorta:  No aneurysm identified.  IMPRESSION:  1.  Persistent splenomegaly. 2.  2.7 cm shadowing nonobstructing gallstone.  There is no evidence for hydronephrosis. 3.   Stable nonobstructive right upper pole kidney stone.   Original Report Authenticated By: Jamesetta Orleans. MATTERN, M.D.    US Abdomen Limited  06/12/2012  *RADIOLOGY REPORT*  Clinical Data: Abdominal distension.  End-stage renal disease. Question ascites.  LIMITED ABDOMEN ULTRASOUND FOR ASCITES  Technique:  Limited ultrasound survey for ascites was performed in all four abdominal quadrants.  Comparison:  Abdominal ultrasound 06/07/2012.  Findings: Limited evaluation of all four quadrants was performed. No ascites is demonstrated.  IMPRESSION: No evidence of ascites.   Original Report Authenticated By: Gerrianne Scale, M.D.    US Biopsy  06/10/2012  *RADIOLOGY REPORT*  Clinical Data/Indication: Renal failure  ULTRASOUND-GUIDED RANDOM RENAL CORTEX CORE BIOPSY.  Sedation: Versed 2.0 mg, Fentanyl 50 mcg.  Total Moderate Sedation Time: 13 minutes.  Procedure: The procedure, risks, benefits, and alternatives were explained to the patient. Questions regarding the procedure were encouraged and answered. The patient understands and consents to the procedure.  The right back was prepped with betadine in a sterile fashion, and a sterile drape was applied covering the operative field. A mask and sterile gloves were used for the procedure.  Under sonographic guidance, three 16 gauge core biopsies of the cortex of the lower pole of the right kidney were obtained. Final imaging was performed.  Patient tolerated the procedure well without complication.  Vital sign monitoring by nursing staff during the procedure will continue as patient is in the special procedures unit for post procedure observation.  Findings: The images document guide needle placement within the right kidney lower pole cortex. Post biopsy images demonstrate no hemorrhage.  IMPRESSION: Successful ultrasound-guided renal core biopsy.   Original Report Authenticated By: Donavan Burnet, M.D.    Dg Chest Portable 1 View  06/10/2012  *RADIOLOGY REPORT*   Clinical Data: Fever, cough  PORTABLE CHEST - 1 VIEW  Comparison: 06/05/2012  Findings: Lung volumes are low with crowding of the bronchovascular markings.  Heart size upper limits of normal.  Right costophrenic angle omitted in the field of view.  No focal pulmonary opacity. No pleural effusion.  No acute osseous finding.  IMPRESSION: No focal acute finding. If the patient's symptoms continue, consider PA and lateral chest radiographs obtained at full inspiration when the patient is clinically able.   Original Report Authenticated By: Harrel Lemon, M.D.    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  HIV.  CT MAXILLOFACIAL WITHOUT CONTRAST  Technique:  Multidetector CT imaging of the maxillofacial structures was performed. Multiplanar CT image reconstructions were also generated.  Comparison: None.  Findings: Visualized noncontrast brain parenchyma is within normal limits. Calcified atherosclerosis at the skull base.  Widespread upper cervical lymphadenopathy partially visible.  Among the largest visible nodes are of left level be, 20 mm short axis.  Tonsillar and adenoid hypertrophy also noted and may be related to the lymphoproliferative disorder.  Visualized orbit soft tissues are within normal limits.  Visualized tympanic cavities and mastoids are clear.  Minimal sphenoid sinus mucosal thickening. Mild to moderate ethmoid sinus mucosal thickening. Frontal sinuses are clear. Moderate bilateral maxillary sinus mucosal thickening which is mostly dependent and at least in part related to mucous retention cyst. Mucosal thickening at both [facility] greater on the left.  The nasal septum is intact.  No osseous erosions.  Negative nasal cavity. No acute osseous abnormality identified.  IMPRESSION: 1.  Mild to moderate conventional appearing ethmoid and maxillary sinus mucosal thickening.  No imaging features of Wegener's  granulomatosis identified. 2.  Cervical lymphadenopathy partially visible.  Adenoid and tonsillar hypertrophy.  Favor lymphoproliferative disorder related to HIV, but lymphoma / leukemia not excluded.   Original Report Authenticated By: Harley Hallmark, M.D.      Admission HPI: Mr. Moist is a 51 year old male with a history of HIV (most recent CD4 count 370 on 05/05/12, VL undetectable), renal insufficiency with baseline creatinine~1.5, hypertension, recurrent lymphadenopathy and fever (since February 2012) thought to be benign reactive changes related to HIV. He presents to the ED with fever and lymphadenopathy for the previous three days. He stated that he was working outside on three days prior to presentation and thought he had "heat stroke"and has since he had had intermittent fevers, chills, sweats, worsening fatigue, and enlarging lymphadenopathy. This was the seventh episode since February 2012. He was also complaining of abdominal pain and believed was resulting from his spleen being enlarged. These episodes typically last up to 2 weeks, are associated with rise in creatinine and improve without any specific treatment. He had 2 axillary lymph node biopsies as well as a recent bone marrow biopsy  that were negative for malignancy and infection. Pathology showed reactive polyclonal plasmacytosis. He stated that he has been compliant with Atripla therapy for three years, had a recent good report from his ID physician Dr. Orvan Falconer with undetectable viral load. He was advised to stop using his dapsone for prophylaxis because is CD4 Count had improved. Review of systems was positive for headache, nausea, decreased appetite, dark yellow urine, shortness of breath with exertion which had accompanied the previous episodes. He also has also had 2 days of dry cough, and several months of worsening numbness and tingling in his bilateral toes. Temperature was 103 at home.    Hospital Course by problem  list:  Fever and lymphadenopathy: Mr. Buenger had had at least 5 previous admissions in the last 1-1/2 years with a similar episodes of fever, fatigue and excessive sweating. Usually improve in about 5-7 days with Tylenol, and IV gentle rehydration. He had been evaluated by hematology and rheumatology and there was no specific etiology for his symptoms. It was felt that these fevers are probably related to HIV, DRESS syndrome 2/2 to dapsone, episodic fever syndrome or some form of autoimmune disorder. Extensive history including family history medical history was obtained without any clues to the possible etiology of his fevers and lymphadenopathy other than and HIV. Mr. Brandow does not have information regarding his family history since he was adopted as a child. Review of his medications indicated that he had started dapsone in March 2010, and the first episode of fever was in November 2011. Left axillary node 2/26/132  biopsy: Benign lymph node tissue with regressively transformed germinal centers, prominent vascularity and extensive plasmacytosis, architecture generally preserved likely HIV related. Bone marrow Bx 02/24/12: Mild absolute neutropenia, increased bands and toxic granulation. Moderate normocytic, normochromic anemia, no schistocytes, moderate thrombocytopenia. Has had been followed by oncologist Dr. Gaylyn Rong while in hospital but he never follow up in the clinic. Multiple laboratory tests, were ordered as outlined here ANA - negative, serum and urine free kappa and lambda levels are suggestion of some form of light chain deposition disease. IgG = 2318, Serum free Kappa = 8.9, free lambda 12.9, with kappa/lambda ration - normal. Urine free kappa 292, and free lambda = 5.38. IgA and IgM are normal, serine protease is elevated, P-ANCA positive. Serum and urine Immunofixation - No monoclonal free light chains (Ben no ce Jones Protein) detectedVasculitis was also considered given recurrent AKI whenever he  gets flares of his condition together with elevated CRP and ESR. at 107 and P-ANCA positive test. Head CT scan, including the sinuses did not reveal features of Wegener's granulomatosis. A renal biopsy was obtained and at the time of discharge results were still pending. He will followup with Dr. Arrie Aran (nephronology)l with the retinal biopsy results. Blood and urine cultures were negative. His symptoms of fever and fatigue improved with Tylenol, and conservative management together with gentle IV hydration. Dapsone has been discontinued after consultation with ID since this has been associated with dress syndrome.   Acute on chronic kidney disease : Mr. Lamere baseline creatinine about 1.4-1.6, and this had increased to 2.59 on admission. His AKI is most likely related to the fever and lymphadenopathy as opposed to being a separate issue. With gentle IV rehydration creatinine trended down to 1.28 at the time of discharge. Of note, he has history of nephrolithiasis and obstructive uropathy in past requiring nephrostomy tube. There were granular casts on U/A but otherwise normal with feNA of 0.2%. Renal ultrasound did not  show any evidence of hydronephrosis, but there was a nonobstructive right upper pole kidney stone. Nephrology was consulted and they recommended conservative management given that there was no definitive diagnosis in Mr. Sitzes case. 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 ANA, C3 and C4 complement level, antiglomerular basement membrane antibodies, antistreptolysin O titers are all negative. He will followup as outpatient with Dr Hyman Hopes with the results of the renal biopsy.  Anemia: There was a noted decrease in his hemoglobin 7.0<7.6<7.9<8.0<9.5<12.1.This was thought could be related to the pancytopenia or hematemesis, hemolysis or anemia of chronic diseases. Iron panel revealed low, and, increased, ferritin.  Coombs test, was positive, but LDH was normal and haptoglobin was elevated, reticulocyte count was low at 13.0. Fecal occult blood stool was negative. This did not fit a picture of hemolysis. Hematology was consulted and advised on transfusing with packed red blood cells. By the time of discharge his Hb was 8.2. The working diagnosis was anemia with 3 complaints of hemolysis, anemia be of chronic disease, and bone marrow suppression. Dr Gaylyn Rong recommended a second opinion at [facility] for further evaluation. Mr Pyo will be contacted by Dr Lodema Pilot office for a referral to [facility].  Acute Pancytopenia: During this admission his white blood cell count continued to drop from 4.0 to 3.2. This has been an issue during previous admissions. History not suggestive of GI or other source of blood loss. Likely related to acute process. Been followed by hematologist. Bone marrow biopsy and lymph node biopsies unrevealing as already mentioned. Last ferritin level 685 in May 2013. In June 2013, cultures from bone marrow aspirate was still negative for AFB, fungal, viral. His bone marrow's cytogenetics were negative. Therefore his low blood counts and fever were thought to be most likely reactive to HIV or some autoimmune disorder. There was no concern for primary bone marrow disease such as leukemia, lymphoma, MDS. Dr Jethro Bolus (oncologist) had recommended that he follows up with his primary doctor and HIV. He had also been seen by Dr Nickola Major, a rheumatologist who ordered several tests on 04/28/2012 including ESR 55, C-reactive protein 8.5, HLA-B27 negative, CCP antibodies, IgG - negative, rheumatoid factor less than 13, ANA negative, C3 complement 147, C4 compliment 27, uric acid 7.0, and ANCA panel -all Negative. TSH 3.85, testosterone 2.77. X-ray of hand to rule osteoarthritis - normal. He has a followup appointment on 06/10/2012. On receiving the results of the cuff, I called Dr. Nickola Major and updated her about this new results. She felt that  this finding was most likely consistent with an autoimmune disorder other than an indication of a new diagnosis. Mr. Hollenback is at an increased risk of infection given his pancytopenia with absolute neutrophil count of 1.3. He was counseled about this increased risk. He should avoid going into crowded places, and if he has to, he must wear a mask to avoid touching infections given his reduced immunity.  We will followup with the ID clinic, where his CBC will be done, and appropriate changes in her medication made. Dapsone has been discontinued and a prophylaxis for other infections, and HIV will be addressed during his clinic followup with Dr. Orvan Falconer. HIV-Mr. Hohensee was compliant with his HIV medication for the last 3 years. His CD4 count had improved from 110-370 at the time of admission. Accompanying the pancytopenia was a reduction in his CD4 count from 370 at 183. The viral load remained well suppressed. Because of  his acute renal insufficient, Atripla was withheld. He has been discharged without his HIV treatment since nephrology has suggested a change to a non-Tenofovir regimen. HLAB 507-1 and intergrase susceptibility tests have been ordered. This will be followed up by Dr. Orvan Falconer in the ID clinic.  Cough: Patient has cough is which has been persistent and disturbing for the last couple of days. Chest exam is normal and not acute changes on CXR with two views. He has been discharged on [place].  Splenomegaly. Noted Ultrasound of abdomen on 9/10 with splenomegaly of 15.8 cm. Review of previous CT scan show similar splenomegaly as well 26.5cm in 10/2010. These findings might be related to the anemia, pancytopenia and lymphadenopathy. Repeat ultrasound did not show any ascites any ascites.  ID will follow up as outpatient. Given that these results take a long time to come back (at least 2 weeks).  Hyponatremia - resolved: has been in low 130s over past few months, further decrease likely secondary  to some volume depletion and hyperglycemia. Systolic blood pressure on lower side. He was not orthostatic. This resolved on discharge to a Sodium of 137.  HTN- systolic blood pressure on lower side. He was restarted on Labetalol at discharge. DM 2: Hyperglycemia on admission-368 in setting of acute process. Last A1c 6.6 in May 2013 and repeat A1c shows 7.1% with elevated serum glucose.  He meets the diagnostic criteria for Diabetes. - started Lantus 5 IU at bedtime with SSI with meals. Mr. Deisher does not wish to start oral hypoglycemics, and TO try lifestyle changes with diet and weight loss. He fully understands the repercussions of having uncontrolled blood sugars and he promises to followup as outpatient for further education and possible initiation of therapy for diabetes. Primary care physician will address this issue as outpatient.   Discharge Vitals:  BP 154/80  Pulse 92  Temp 98 F (36.7 C) (Oral)  Resp 19  Ht 6' 0.05" (1.83 m)  Wt 252 lb 3.3 oz (114.4 kg)  BMI 34.16 kg/m2  SpO2 98%  Discharge Labs:  Results for orders placed during the hospital encounter of 06/05/12 (from the past 24 hour(s))  GLUCOSE, CAPILLARY     Status: Abnormal   Collection Time   06/14/12  7:21 AM      Component Value Range   Glucose-Capillary 266 (*) 70 - 99 mg/dL  GLUCOSE, CAPILLARY     Status: Abnormal   Collection Time   06/14/12 11:36 AM      Component Value Range   Glucose-Capillary 198 (*) 70 - 99 mg/dL  GLUCOSE, CAPILLARY     Status: Abnormal   Collection Time   06/14/12  5:21 PM      Component Value Range   Glucose-Capillary 197 (*) 70 - 99 mg/dL  GLUCOSE, CAPILLARY     Status: Abnormal   Collection Time   06/14/12  8:56 PM      Component Value Range   Glucose-Capillary 161 (*) 70 - 99 mg/dL  CBC     Status: Abnormal   Collection Time   06/15/12  6:20 AM      Component Value Range   WBC 3.4 (*) 4.0 - 10.5 K/uL   RBC 2.75 (*) 4.22 - 5.81 MIL/uL   Hemoglobin 8.2 (*) 13.0 - 17.0 g/dL    HCT 65.7 (*) 84.6 - 52.0 %   MCV 92.0  78.0 - 100.0 fL   MCH 29.8  26.0 - 34.0 pg   MCHC 32.4  30.0 - 36.0  g/dL   RDW 16.1  09.6 - 04.5 %   Platelets 149 (*) 150 - 400 K/uL    Signed: Dow Adolph 06/15/2012, 7:14 AM   Time Spent on Discharge: 40 minutes

## 2012-06-15 NOTE — Progress Notes (Signed)
I received a call from the primary service to see if it was OK to discharge the patient because he had a renal biopsy.  I have checked with Dr. Arrie Aran who says patient is fine for discharge and has an appt with Dr Hyman Hopes at   September 23 at 10 AM to discuss biopsy results.    Thank you  Khyan Oats A

## 2012-06-16 LAB — TYPE AND SCREEN
ABO/RH(D): O POS
Antibody Screen: POSITIVE
Unit division: 0

## 2012-06-17 ENCOUNTER — Telehealth: Payer: Self-pay | Admitting: Oncology

## 2012-06-17 NOTE — Telephone Encounter (Signed)
gve this information to kim in medical records for her to follow up with this request

## 2012-06-19 ENCOUNTER — Encounter (HOSPITAL_COMMUNITY): Payer: Self-pay | Admitting: *Deleted

## 2012-06-19 ENCOUNTER — Emergency Department (HOSPITAL_COMMUNITY): Payer: Medicaid Other

## 2012-06-19 ENCOUNTER — Inpatient Hospital Stay (HOSPITAL_COMMUNITY)
Admission: EM | Admit: 2012-06-19 | Discharge: 2012-06-23 | DRG: 919 | Disposition: A | Discharge status: Home / Self Care | Payer: Medicaid Other | Attending: Internal Medicine | Admitting: Internal Medicine

## 2012-06-19 DIAGNOSIS — S37011A Minor contusion of right kidney, initial encounter: Secondary | ICD-10-CM | POA: Diagnosis present

## 2012-06-19 DIAGNOSIS — D62 Acute posthemorrhagic anemia: Secondary | ICD-10-CM | POA: Diagnosis present

## 2012-06-19 DIAGNOSIS — R109 Unspecified abdominal pain: Secondary | ICD-10-CM

## 2012-06-19 DIAGNOSIS — N183 Chronic kidney disease, stage 3 unspecified: Secondary | ICD-10-CM | POA: Diagnosis present

## 2012-06-19 DIAGNOSIS — K8020 Calculus of gallbladder without cholecystitis without obstruction: Secondary | ICD-10-CM | POA: Diagnosis present

## 2012-06-19 DIAGNOSIS — I10 Essential (primary) hypertension: Secondary | ICD-10-CM

## 2012-06-19 DIAGNOSIS — R51 Headache: Secondary | ICD-10-CM | POA: Diagnosis present

## 2012-06-19 DIAGNOSIS — E139 Other specified diabetes mellitus without complications: Secondary | ICD-10-CM

## 2012-06-19 DIAGNOSIS — E785 Hyperlipidemia, unspecified: Secondary | ICD-10-CM

## 2012-06-19 DIAGNOSIS — IMO0002 Reserved for concepts with insufficient information to code with codable children: Principal | ICD-10-CM | POA: Diagnosis present

## 2012-06-19 DIAGNOSIS — E669 Obesity, unspecified: Secondary | ICD-10-CM | POA: Diagnosis present

## 2012-06-19 DIAGNOSIS — K661 Hemoperitoneum: Secondary | ICD-10-CM | POA: Diagnosis present

## 2012-06-19 DIAGNOSIS — E119 Type 2 diabetes mellitus without complications: Secondary | ICD-10-CM | POA: Diagnosis present

## 2012-06-19 DIAGNOSIS — Z21 Asymptomatic human immunodeficiency virus [HIV] infection status: Secondary | ICD-10-CM | POA: Diagnosis present

## 2012-06-19 DIAGNOSIS — N189 Chronic kidney disease, unspecified: Secondary | ICD-10-CM

## 2012-06-19 DIAGNOSIS — T375X5A Adverse effect of antiviral drugs, initial encounter: Secondary | ICD-10-CM | POA: Diagnosis present

## 2012-06-19 DIAGNOSIS — B20 Human immunodeficiency virus [HIV] disease: Secondary | ICD-10-CM

## 2012-06-19 DIAGNOSIS — Y849 Medical procedure, unspecified as the cause of abnormal reaction of the patient, or of later complication, without mention of misadventure at the time of the procedure: Secondary | ICD-10-CM | POA: Diagnosis present

## 2012-06-19 DIAGNOSIS — S37019A Minor contusion of unspecified kidney, initial encounter: Secondary | ICD-10-CM

## 2012-06-19 DIAGNOSIS — R599 Enlarged lymph nodes, unspecified: Secondary | ICD-10-CM

## 2012-06-19 DIAGNOSIS — I129 Hypertensive chronic kidney disease with stage 1 through stage 4 chronic kidney disease, or unspecified chronic kidney disease: Secondary | ICD-10-CM | POA: Diagnosis present

## 2012-06-19 DIAGNOSIS — Z888 Allergy status to other drugs, medicaments and biological substances status: Secondary | ICD-10-CM

## 2012-06-19 DIAGNOSIS — B343 Parvovirus infection, unspecified: Secondary | ICD-10-CM

## 2012-06-19 DIAGNOSIS — K8050 Calculus of bile duct without cholangitis or cholecystitis without obstruction: Secondary | ICD-10-CM

## 2012-06-19 DIAGNOSIS — N179 Acute kidney failure, unspecified: Secondary | ICD-10-CM | POA: Diagnosis present

## 2012-06-19 LAB — COMPREHENSIVE METABOLIC PANEL
ALT: 6 U/L (ref 0–53)
BUN: 19 mg/dL (ref 6–23)
CO2: 29 mEq/L (ref 19–32)
Calcium: 9.7 mg/dL (ref 8.4–10.5)
Creatinine, Ser: 1.68 mg/dL — ABNORMAL HIGH (ref 0.50–1.35)
GFR calc Af Amer: 53 mL/min — ABNORMAL LOW (ref >90)
GFR calc non Af Amer: 46 mL/min — ABNORMAL LOW (ref >90)
Glucose, Bld: 164 mg/dL — ABNORMAL HIGH (ref 70–99)

## 2012-06-19 LAB — CBC WITH DIFFERENTIAL/PLATELET
Basophils Relative: 0 % (ref 0–1)
Hemoglobin: 9 g/dL — ABNORMAL LOW (ref 13.0–17.0)
Lymphocytes Relative: 18 % (ref 12–46)
MCHC: 32.1 g/dL (ref 30.0–36.0)
Monocytes Relative: 6 % (ref 3–12)
Neutro Abs: 9.3 10*3/uL — ABNORMAL HIGH (ref 1.7–7.7)
Neutrophils Relative %: 76 % (ref 43–77)
RBC: 3.1 MIL/uL — ABNORMAL LOW (ref 4.22–5.81)
WBC: 12.3 10*3/uL — ABNORMAL HIGH (ref 4.0–10.5)

## 2012-06-19 MED ORDER — ONDANSETRON HCL 4 MG/2ML IJ SOLN
4.0000 mg | Freq: Once | INTRAMUSCULAR | Status: AC
  Administered 2012-06-19: 4 mg via INTRAVENOUS
  Filled 2012-06-19: qty 2

## 2012-06-19 MED ORDER — SODIUM CHLORIDE 0.9 % IV BOLUS (SEPSIS)
500.0000 mL | Freq: Once | INTRAVENOUS | Status: AC
  Administered 2012-06-19: 500 mL via INTRAVENOUS

## 2012-06-19 MED ORDER — HYDROMORPHONE HCL PF 1 MG/ML IJ SOLN
1.0000 mg | Freq: Once | INTRAMUSCULAR | Status: AC
  Administered 2012-06-19: 1 mg via INTRAVENOUS
  Filled 2012-06-19: qty 1

## 2012-06-19 MED ORDER — SODIUM CHLORIDE 0.9 % IV SOLN
INTRAVENOUS | Status: DC
  Administered 2012-06-19: via INTRAVENOUS
  Administered 2012-06-20: 1000 mL via INTRAVENOUS
  Administered 2012-06-22: 18:00:00 via INTRAVENOUS

## 2012-06-19 NOTE — ED Notes (Addendum)
(  Multiple complaints). Was d/c'd from [facility] on Wednesday. Admitted for kidney biopsy. Returns for increased pain. Reports: "has gallstones and kidney stones". C/o R flank pain, R low back pain, rib pain abd chest pain. Also nv, HA, dizzy, chills & sweats.  (denies: fever or diarrhea).

## 2012-06-19 NOTE — ED Provider Notes (Addendum)
History     CSN: [id number]  Arrival date & time 06/19/12  2126   First MD Initiated Contact with Patient 06/19/12 2210      Chief Complaint  Patient presents with  . Flank Pain  . Back Pain  . Abdominal Pain    (Consider location/radiation/quality/duration/timing/severity/associated sxs/prior treatment) Patient is a 51 y.o. male presenting with flank pain, back pain, and abdominal pain. The history is provided by the patient.  Flank Pain Associated symptoms include abdominal pain. Pertinent negatives include no chest pain, no headaches and no shortness of breath.  Back Pain  Associated symptoms include abdominal pain. Pertinent negatives include no chest pain, no fever and no headaches.  Abdominal Pain The primary symptoms of the illness include abdominal pain, nausea and vomiting. The primary symptoms of the illness do not include fever, shortness of breath or diarrhea.  Additional symptoms associated with the illness include back pain.   patient with history of HIV followed by outpatient clinics. An infectious disease. Was just discharged from the hospital for different concerns on Wednesday I would've been 4 days ago. Today at about 8:00 in the evening acute onset of right upper quadrant pain radiated to the back and to the right flank area was very severe 10 out of 10. Never had anything like this before. Pain is described as sharp. Patient has been told in the past that that he has kidney stones and gallstones. During the previous admission they did do a kidney biopsy he was mostly in for that for having swollen lymph nodes and fever that is not occurring now. Did have vomiting here tonight.  Past Medical History  Diagnosis Date  . Hypertension   . HIV (human immunodeficiency virus infection) 2010  . Hypogonadism male   . CKD (chronic kidney disease) stage 3, GFR 30-59 ml/min     hydrated cr 1.5 gfr 48  . Kidney stones     "quite a few times"  . High cholesterol   . Heart  murmur   . Exertional dyspnea 02/25/12    "and lying down"  . Anemia   . Blood transfusion   . Hernia     "surgical; where appendix ruptured"  . Pancytopenia with fever 02/26/2012  . LYMPHADENOPATHY, DIFFUSE 11/18/2010    Qualifier: Diagnosis of  By: Sundra Aland NP, Malvin Johns       Past Surgical History  Procedure Date  . Lithotripsy 2010  . Appendectomy 2001    via midline incision  . Axillary lymph node dissection 2011; 2012    left    Family History  Problem Relation Age of Onset  . Adopted: Yes    History  Substance Use Topics  . Smoking status: Never Smoker   . Smokeless tobacco: Never Used  . Alcohol Use: 0.0 oz/week     02/25/12 "couple mixed drinks or beers or glasses of wine/month"      Review of Systems  Constitutional: Negative for fever.  HENT: Negative for neck pain.   Eyes: Negative for redness.  Respiratory: Negative for shortness of breath.   Cardiovascular: Negative for chest pain.  Gastrointestinal: Positive for nausea, vomiting and abdominal pain. Negative for diarrhea.  Genitourinary: Positive for flank pain.  Musculoskeletal: Positive for back pain.  Skin: Negative for rash.  Neurological: Negative for headaches.  Hematological: Does not bruise/bleed easily.    Allergies  Codeine and Sulfonamide derivatives  Home Medications  No current outpatient prescriptions on file.  BP 108/81  Pulse 87  Resp 20  SpO2 100%  Physical Exam  Nursing note and vitals reviewed. Constitutional: He is oriented to person, place, and time. He appears well-developed and well-nourished.  HENT:  Head: Normocephalic and atraumatic.  Mouth/Throat: Oropharynx is clear and moist.  Eyes: Conjunctivae normal and EOM are normal. Pupils are equal, round, and reactive to light.  Neck: Normal range of motion. Neck supple.  Cardiovascular: Normal rate, regular rhythm and normal heart sounds.   No murmur heard. Pulmonary/Chest: Effort normal and breath sounds normal.    Abdominal: Soft. Bowel sounds are normal. There is tenderness.       Tender right upper quadrant.  Musculoskeletal: Normal range of motion.  Neurological: He is alert and oriented to person, place, and time. No cranial nerve deficit. He exhibits normal muscle tone. Coordination normal.  Skin: Skin is warm. No rash noted.    ED Course  Procedures (including critical care time)  Labs Reviewed  COMPREHENSIVE METABOLIC PANEL - Abnormal; Notable for the following:    Glucose, Bld 164 (*)     Creatinine, Ser 1.68 (*)     Total Protein 9.1 (*)     Albumin 3.2 (*)     GFR calc non Af Amer 46 (*)     GFR calc Af Amer 53 (*)     All other components within normal limits  CBC WITH DIFFERENTIAL - Abnormal; Notable for the following:    WBC 12.3 (*)     RBC 3.10 (*)     Hemoglobin 9.0 (*)     HCT 28.0 (*)     Neutro Abs 9.3 (*)     All other components within normal limits  LIPASE, BLOOD  URINALYSIS, ROUTINE W REFLEX MICROSCOPIC   Ct Abdomen Pelvis Wo Contrast  06/19/2012  *RADIOLOGY REPORT*  Clinical Data: Right flank pain radiating around the right side of the abdomen.  Sudden onset right-sided flank pain.  Vomiting.  CT ABDOMEN AND PELVIS WITHOUT CONTRAST  Technique:  Multidetector CT imaging of the abdomen and pelvis was performed following the standard protocol without intravenous contrast.  Comparison: 02/25/2012.  Findings: Dependent atelectasis the lung bases.  Small amount of pericardial fluid or thickening.  Partially visualized left gynecomastia.  Small amount of fluid is present around the liver margin. Calcified gallstone.  Splenomegaly.  Stable periaortic adenopathy.  There is a right subcapsular renal hematoma measuring 4.2 cm in thickness, 9 cm AP and about 9.4 cm cranial-caudal.  There is also retroperitoneal hematoma tracking cranially that is distinct from the right adrenal gland. Retroperitoneal hematoma component measures 5.8 cm transverse, 12.5 cm AP and 12.8 cm  cranial-caudal. There is no left retroperitoneal hematoma.  Nonobstructing right renal collecting system calculi are present. Mass effect on the right kidney from the subcapsular hematoma is moderate.  There is no effacement of central sinus fat.  The left kidney is atrophic.  Abdominal aortic atherosclerosis.  Stomach, small and large bowel grossly appears within normal limits. Minimal free fluid in the anatomic pelvis.  No aggressive osseous lesions.  L5-S1 predominant lumbar spondylosis.  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  IMPRESSION: 1.  Right retroperitoneal hematoma and right renal subcapsular hematoma. Correlating with recent procedural history, this may represent post biopsy hematoma. Maximal thickness of right renal subcapsular hematoma is 42 mm with moderate mass effect on the right kidney. 2.  Unchanged cholelithiasis and nonobstructing right renal calculi. 3.  Splenomegaly. 4.  Unchanged periaortic adenopathy, s some of which may now be reactive. 5.  Left renal  atrophy.   Original Report Authenticated By: Andreas Newport, M.D.    Results for orders placed during the hospital encounter of 06/19/12  COMPREHENSIVE METABOLIC PANEL      Component Value Range   Sodium 135  135 - 145 mEq/L   Potassium 5.1  3.5 - 5.1 mEq/L   Chloride 96  96 - 112 mEq/L   CO2 29  19 - 32 mEq/L   Glucose, Bld 164 (*) 70 - 99 mg/dL   BUN 19  6 - 23 mg/dL   Creatinine, Ser 9.52 (*) 0.50 - 1.35 mg/dL   Calcium 9.7  8.4 - 84.1 mg/dL   Total Protein 9.1 (*) 6.0 - 8.3 g/dL   Albumin 3.2 (*) 3.5 - 5.2 g/dL   AST 12  0 - 37 U/L   ALT 6  0 - 53 U/L   Alkaline Phosphatase 61  39 - 117 U/L   Total Bilirubin 0.3  0.3 - 1.2 mg/dL   GFR calc non Af Amer 46 (*) >90 mL/min   GFR calc Af Amer 53 (*) >90 mL/min  CBC WITH DIFFERENTIAL      Component Value Range   WBC 12.3 (*) 4.0 - 10.5 K/uL   RBC 3.10 (*) 4.22 - 5.81 MIL/uL   Hemoglobin 9.0 (*) 13.0 - 17.0 g/dL   HCT 32.4 (*) 40.1 - 02.7 %   MCV 90.3  78.0  - 100.0 fL   MCH 29.0  26.0 - 34.0 pg   MCHC 32.1  30.0 - 36.0 g/dL   RDW 25.3  66.4 - 40.3 %   Platelets 258  150 - 400 K/uL   Neutrophils Relative 76  43 - 77 %   Neutro Abs 9.3 (*) 1.7 - 7.7 K/uL   Lymphocytes Relative 18  12 - 46 %   Lymphs Abs 2.2  0.7 - 4.0 K/uL   Monocytes Relative 6  3 - 12 %   Monocytes Absolute 0.7  0.1 - 1.0 K/uL   Eosinophils Relative 0  0 - 5 %   Eosinophils Absolute 0.0  0.0 - 0.7 K/uL   Basophils Relative 0  0 - 1 %   Basophils Absolute 0.0  0.0 - 0.1 K/uL  LIPASE, BLOOD      Component Value Range   Lipase 21  11 - 59 U/L     1. Abdominal pain   2. Biliary colic   3. Renal hematoma       MDM  Patient with acute onset of right upper quadrant pain radiates to the back concerns could be pancreatitis however lipase is normal. Patient also has a history of gallstones opposing kidney stone both of those need to be ruled out. CT scan has been ordered. Patient does have a leukocytosis and that is new. Patient if they require admission to be admitted by outpatient clinics.  CT scan results noted with the right kidney hematoma status. A biopsy could be causes of pain not sure if it's too large to be of concern. We'll discuss with the outpatient clinic team. No change in the gallbladder findings no change in the kidney stone patient could be experiencing biliary colic though certainly no evidence of cholecystitis.  Discussed with internal medicine team they will come see the patient. As stated above of concern that this could be biliary colic and/or could be related to the hematoma round the kidney but I would put my money on the biliary colic patient may require surgical consultation. Pain is improved  spell for a 10 patient still with some tenderness in a right upper quadrant right flank and right CVA area.      Shelda Jakes, MD 06/19/12 2351  Sco Sandria Senter, MD 06/20/12 0347  Shelda Jakes, MD 06/20/12 1311

## 2012-06-19 NOTE — ED Notes (Signed)
Patient states that he is unable to urinate at this time.

## 2012-06-19 NOTE — ED Notes (Signed)
Patient complaining of right flank pain that radiates around the right side of his abdomen.  Patient states that he currently has a kidney stone and gallstones; patient was recently discharged from the hospital on Wednesday (was admitted for nine days) -- patient was admitted for a different reasons (not due to gallbladder or kidney stones).  Patient states that when he went to go eat dinner, he had a sudden onset of headache and right sided flank pain.  Patient began to vomit; reports 3 episodes of emesis in the last 24 hours.  Patient alert and oriented x4; PERRL present.  Upon arrival to room, patient changed into gown.  Will continue to monitor.

## 2012-06-19 NOTE — ED Notes (Signed)
Pt given urinal, unable to void at this time 

## 2012-06-19 NOTE — ED Notes (Signed)
Patient back from CT; currently sitting up in bed; no respiratory or acute distress noted.  Patient updated on plan of care; informed patient that MD has ordered medications and we are starting IV access. Patient has no other questions or concerns at this time; will continue to monitor.

## 2012-06-19 NOTE — ED Notes (Signed)
Dr. Zacowski at bedside  

## 2012-06-19 NOTE — ED Notes (Signed)
Attempted IV access x2; unsuccessful.  Another RN to try. 

## 2012-06-20 DIAGNOSIS — R112 Nausea with vomiting, unspecified: Secondary | ICD-10-CM

## 2012-06-20 DIAGNOSIS — S37011A Minor contusion of right kidney, initial encounter: Secondary | ICD-10-CM | POA: Diagnosis present

## 2012-06-20 DIAGNOSIS — M549 Dorsalgia, unspecified: Secondary | ICD-10-CM

## 2012-06-20 DIAGNOSIS — K661 Hemoperitoneum: Secondary | ICD-10-CM | POA: Diagnosis present

## 2012-06-20 DIAGNOSIS — B20 Human immunodeficiency virus [HIV] disease: Secondary | ICD-10-CM

## 2012-06-20 DIAGNOSIS — R109 Unspecified abdominal pain: Secondary | ICD-10-CM

## 2012-06-20 LAB — CBC WITH DIFFERENTIAL/PLATELET
Basophils Absolute: 0 10*3/uL (ref 0.0–0.1)
Basophils Relative: 0 % (ref 0–1)
Eosinophils Absolute: 0 10*3/uL (ref 0.0–0.7)
Eosinophils Relative: 0 % (ref 0–5)
MCH: 29 pg (ref 26.0–34.0)
MCHC: 31.9 g/dL (ref 30.0–36.0)
MCV: 90.7 fL (ref 78.0–100.0)
Platelets: 240 10*3/uL (ref 150–400)
RDW: 15.4 % (ref 11.5–15.5)

## 2012-06-20 LAB — URINALYSIS, ROUTINE W REFLEX MICROSCOPIC
Glucose, UA: NEGATIVE mg/dL
Leukocytes, UA: NEGATIVE
Protein, ur: 100 mg/dL — AB
Urobilinogen, UA: 0.2 mg/dL (ref 0.0–1.0)

## 2012-06-20 LAB — CBC
HCT: 23.7 % — ABNORMAL LOW (ref 39.0–52.0)
Hemoglobin: 8.7 g/dL — ABNORMAL LOW (ref 13.0–17.0)
MCH: 28.4 pg (ref 26.0–34.0)
MCH: 29.8 pg (ref 26.0–34.0)
Platelets: 214 10*3/uL (ref 150–400)
RBC: 2.61 MIL/uL — ABNORMAL LOW (ref 4.22–5.81)
RBC: 2.92 MIL/uL — ABNORMAL LOW (ref 4.22–5.81)
RDW: 15.5 % (ref 11.5–15.5)
WBC: 6.3 10*3/uL (ref 4.0–10.5)
WBC: 6.2 10*3/uL (ref 4.0–10.5)

## 2012-06-20 LAB — BASIC METABOLIC PANEL
Calcium: 9 mg/dL (ref 8.4–10.5)
GFR calc non Af Amer: 37 mL/min — ABNORMAL LOW (ref >90)
Glucose, Bld: 127 mg/dL — ABNORMAL HIGH (ref 70–99)
Sodium: 136 mEq/L (ref 135–145)

## 2012-06-20 LAB — GLUCOSE, CAPILLARY

## 2012-06-20 LAB — URINE MICROSCOPIC-ADD ON

## 2012-06-20 LAB — PREPARE RBC (CROSSMATCH)

## 2012-06-20 LAB — PROTIME-INR: INR: 1.15 (ref 0.00–1.49)

## 2012-06-20 MED ORDER — INSULIN ASPART 100 UNIT/ML ~~LOC~~ SOLN
0.0000 [IU] | Freq: Three times a day (TID) | SUBCUTANEOUS | Status: DC
  Administered 2012-06-21: 1 [IU] via SUBCUTANEOUS
  Administered 2012-06-21: 2 [IU] via SUBCUTANEOUS
  Administered 2012-06-21 – 2012-06-23 (×3): 1 [IU] via SUBCUTANEOUS

## 2012-06-20 MED ORDER — MORPHINE SULFATE 2 MG/ML IJ SOLN
2.0000 mg | INTRAMUSCULAR | Status: DC | PRN
  Administered 2012-06-20 – 2012-06-22 (×4): 2 mg via INTRAVENOUS
  Filled 2012-06-20 (×4): qty 1

## 2012-06-20 MED ORDER — ONDANSETRON HCL 4 MG/2ML IJ SOLN: 4.0000 mg | Freq: Four times a day (QID) | INTRAMUSCULAR | Status: DC | PRN

## 2012-06-20 MED ORDER — ONDANSETRON HCL 4 MG PO TABS: 4.0000 mg | ORAL_TABLET | Freq: Four times a day (QID) | ORAL | Status: DC | PRN

## 2012-06-20 MED ORDER — INSULIN ASPART 100 UNIT/ML ~~LOC~~ SOLN: 0.0000 [IU] | Freq: Every day | SUBCUTANEOUS | Status: DC

## 2012-06-20 MED ORDER — DAPSONE 100 MG PO TABS
100.0000 mg | ORAL_TABLET | Freq: Every day | ORAL | Status: DC
  Filled 2012-06-20: qty 1

## 2012-06-20 MED ORDER — ACETAMINOPHEN 650 MG RE SUPP: 650.0000 mg | Freq: Four times a day (QID) | RECTAL | Status: DC | PRN

## 2012-06-20 MED ORDER — SODIUM CHLORIDE 0.9 % IJ SOLN
3.0000 mL | Freq: Two times a day (BID) | INTRAMUSCULAR | Status: DC
  Administered 2012-06-21 – 2012-06-22 (×2): 3 mL via INTRAVENOUS

## 2012-06-20 MED ORDER — ACETAMINOPHEN 325 MG PO TABS
650.0000 mg | ORAL_TABLET | Freq: Four times a day (QID) | ORAL | Status: DC | PRN
  Administered 2012-06-20 – 2012-06-23 (×8): 650 mg via ORAL
  Filled 2012-06-20 (×8): qty 2

## 2012-06-20 NOTE — ED Notes (Signed)
Patient currently resting quietly in bed; no respiratory or acute distress noted.  Internal medicine currently at bedside; will continue to monitor.

## 2012-06-20 NOTE — H&P (Signed)
Hospital Admission Note Date: 06/20/2012  Patient name: Patrick [name] Medical record number: [id number] Date of birth: 1961-05-17 Age: 51 y.o. Gender: male PCP: Janalyn Harder, MD  Internal Medicine Teaching Service  Attending physician:   Dr. Criselda Peaches   Internal Medicine Teaching Service Contact Information  1st Contact: Dow Adolph, MD  Pager:[phone] 2nd Contact:  Dede Query, MD              [phone]  After 5 pm or weekends: 1st Contact: Pager: [phone] 2nd Contact: Pager: [phone]   Chief Complaint: back pain  History of Present Illness:  [name] is a 51 year old gentleman with a history of HIV, CKD, multiple admissions for diffuse lymphadenopathy and pancytopenia, as well as recent renal biopsy on 06/10/2012, who presents with sudden onset right-sided back pain. Patient states that this evening around 7:30 PM he went out to eat and after eating only a few bites of food, had sudden onset of 10/10 right-sided low back pain. He describes the pain as "excruciating" and "sharp" and it was constant. Patient does not give a history of crescendo/decrescendo or episodic pain. He states the pain starts in his back "in the same place" where he felt pain during the renal biopsy, and it radiates to the right upper quadrant. The pain is worse with laying down and worse with movement. Patient was also experiencing a severe headache at that time. Shortly after the pain began, patient experienced cold sweats, dizziness, vomiting X4.  Currently, his pain is much alleviated after pain medications. Headache is gone.   He does feel like he has been unable to urinate during his stay in the ED, but states he hasn't been drinking as much either. He denies fever, shortness of breath, chest pain, hematuria.   Meds:   Medication List     As of 06/20/2012  1:12 AM      Allergies: Allergies as of 06/19/2012 - Review Complete 06/19/2012  Allergen Reaction Noted  . Codeine Itching, Rash, and  Other (See Comments)   . Sulfonamide derivatives Rash 01/01/2009   Past Medical History  Diagnosis Date  . Hypertension   . HIV (human immunodeficiency virus infection) 2010  . Hypogonadism male   . CKD (chronic kidney disease) stage 3, GFR 30-59 ml/min     hydrated cr 1.5 gfr 48  . Kidney stones     "quite a few times"  . High cholesterol   . Heart murmur   . Exertional dyspnea 02/25/12    "and lying down"  . Anemia   . Blood transfusion   . Hernia     "surgical; where appendix ruptured"  . Pancytopenia with fever 02/26/2012  . LYMPHADENOPATHY, DIFFUSE 11/18/2010    Qualifier: Diagnosis of  By: Sundra Aland NP, Malvin Johns      Past Surgical History  Procedure Date  . Lithotripsy 2010  . Appendectomy 2001    via midline incision  . Axillary lymph node dissection 2011; 2012    left   Family History  Problem Relation Age of Onset  . Adopted: Yes   History   Social History  . Marital Status: Single    Spouse Name: Patrick [name]    Number of Children: Patrick [name]  . Years of Education: Patrick [name]   Occupational History  . Not on file.   Social History Main Topics  . Smoking status: Never Smoker   . Smokeless tobacco: Never Used  . Alcohol Use: 0.0 oz/week     02/25/12 "couple mixed drinks or beers or  glasses of wine/month"  . Drug Use: No  . Sexually Active: Not Currently -- Male partner(s)    Birth Control/ Protection: Condom     refused condoms   Other Topics Concern  . Not on file   Social History Narrative  . No narrative on file    Review of Systems: Pertinent items noted in HPI   Physical Exam Blood pressure 108/81, pulse 87, resp. rate 20, SpO2 100.00%. General:  No acute distress, alert and oriented x 3, well-appearing  HEENT:  Anicteric, PERRL, EOMI, mild cervical lymphadenopathy, moist mucous membranes Cardiovascular:  Regular rate and rhythm, no murmurs, rubs or gallops Respiratory:  Clear to auscultation bilaterally, no wheezes, rales, or rhonchi Abdomen:  Soft,  nondistended, acutely tender in right upper quadrant, positive bowel sounds. Palpable spleen tip, improved from previous admission. Back: Right flank is tender to palpation. Overlying skin has no discoloration or rash. Extremities:  Warm and well-perfused, no clubbing, cyanosis, or edema.  Skin: Warm, dry, no rashes Neuro: Not anxious appearing, no depressed mood, normal affect  Lab results: Basic Metabolic Panel:  Basename 06/19/12 2213  NA 135  K 5.1  CL 96  CO2 29  GLUCOSE 164*  BUN 19  CREATININE 1.68*  CALCIUM 9.7  MG --  PHOS --   Liver Function Tests:  Basename 06/19/12 2213  AST 12  ALT 6  ALKPHOS 61  BILITOT 0.3  PROT 9.1*  ALBUMIN 3.2*    Basename 06/19/12 2213  LIPASE 21  AMYLASE --   CBC:  Basename 06/19/12 2213  WBC 12.3*  NEUTROABS 9.3*  HGB 9.0*  HCT 28.0*  MCV 90.3  PLT 258    Imaging results:  Ct Abdomen Pelvis Wo Contrast  06/19/2012  *RADIOLOGY REPORT*  Clinical Data: Right flank pain radiating around the right side of the abdomen.  Sudden onset right-sided flank pain.  Vomiting.  CT ABDOMEN AND PELVIS WITHOUT CONTRAST  Technique:  Multidetector CT imaging of the abdomen and pelvis was performed following the standard protocol without intravenous contrast.  Comparison: 02/25/2012.  Findings: Dependent atelectasis the lung bases.  Small amount of pericardial fluid or thickening.  Partially visualized left gynecomastia.  Small amount of fluid is present around the liver margin. Calcified gallstone.  Splenomegaly.  Stable periaortic adenopathy.  There is a right subcapsular renal hematoma measuring 4.2 cm in thickness, 9 cm AP and about 9.4 cm cranial-caudal.  There is also retroperitoneal hematoma tracking cranially that is distinct from the right adrenal gland. Retroperitoneal hematoma component measures 5.8 cm transverse, 12.5 cm AP and 12.8 cm cranial-caudal. There is no left retroperitoneal hematoma.  Nonobstructing right renal collecting system  calculi are present. Mass effect on the right kidney from the subcapsular hematoma is moderate.  There is no effacement of central sinus fat.  The left kidney is atrophic.  Abdominal aortic atherosclerosis.  Stomach, small and large bowel grossly appears within normal limits. Minimal free fluid in the anatomic pelvis.  No aggressive osseous lesions.  L5-S1 predominant lumbar spondylosis.  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  IMPRESSION: 1.  Right retroperitoneal hematoma and right renal subcapsular hematoma. Correlating with recent procedural history, this may represent post biopsy hematoma. Maximal thickness of right renal subcapsular hematoma is 42 mm with moderate mass effect on the right kidney. 2.  Unchanged cholelithiasis and nonobstructing right renal calculi. 3.  Splenomegaly. 4.  Unchanged periaortic adenopathy, s some of which may now be reactive. 5.  Left renal atrophy.   Original  Report Authenticated By: Andreas Newport, M.D.     Assessment & Plan by Problem: Active Problems:  HIV DISEASE  HYPERTENSION  LYMPHADENOPATHY, DIFFUSE  Acute-on-chronic kidney injury  Renal hematoma, right   Mr. Geier is a 51 year old gentleman with a history of HIV, CKD, multiple admissions for diffuse lymphadenopathy and pancytopenia, as well as recent renal biopsy on 06/10/2012, who presents with sudden onset right-sided back pain and CT findings concerning for renal subcapsular hematoma.    1) Back/abdominal pain, nausea, vomiting:  most likely from renal subcapsular and retroperitoneal hematoma as complication from recent renal biopsy 06/10/12.    CT abdomen and pelvis shows 4.2 x 9 x 9.2 cm subcapsular right renal hematoma and 12.8 x 12.5x 5.8 centimeter right retroperitoneal hematoma with moderate mass effect.      Other differentials include gallbladder pathology (patient has chronic gallstones- unchanged on CT scan today) and nephrolithiasis ( which is also unchanged on CT scan). History  not consistent with either.   Hb 9.0<8.1- no drop in hemoglobin.   Relative leukocytosis- 12.3 <3.4 on 9/18, baseline appears to be ~4. No fever. No elevation in liver enzymes. No increased bilirubin or Alk Phos. Leukocytosis could be 2/2 stress reaction.   Afebrile and vitals stable.      - Admit to telemetry bed.   - Pain management with morphine 2 mg IV every 3 hours when necessary.   - Zofran for nausea vomiting.   - Will repeat CBC now to check hemoglobin and WBC.   - Curbside renal discussion in ED- recommended watching overnight for signs of active bleeding- in which case we'll need to call urology/surgery/IR if needed. - monitor vitals, H/H, PRBC transfusion if needed - type and screen      2) HIV- Recent CD4 count 183< 370< 210<110.    Followed by Dr. Orvan Falconer. Compliant with medications   Will hold Atripla pending susceptibilities.   - Consider ID help.   -Continue dapsone 100 mg daily      3) Acute on chronic renal disease: Was previously being worked up for vasculitis/GN during last admission with renal biopsy.    Results still pending.   Creatinine 1.68< 1.23   - Hydration with normal saline 100 cc per hour overnight.   - Possible renal consult in a.m. for hematoma and further management.      4) Anemia: Extensive workup during last admission- consulted hematology. Patient also received 1 unit of PRBC on 06/12/12.   - Hemoglobin better than discharge.   - follow CBC      5) DVT prophylaxis: SCDs.       Signed: Denton Ar 06/20/2012, 1:12 AM

## 2012-06-20 NOTE — ED Notes (Signed)
Report given to Richelle, RN on 6700.  No further questions/concerns from RN; informed RN she can call back with any questions/concerns once patient arrives to floor.  Preparing patient for transport.

## 2012-06-20 NOTE — Consult Note (Signed)
[facility] Consult Note [facility]  Date: 06/20/2012  Patient name: Patrick [name] Medical record number: [id number] Date of birth: 1961-03-17 Age: 51 y.o. Gender: male PCP: Patrick Harder, MD  Medical Service: Internal Medicine Teaching Service   Conulting physician: Dr. Criselda [name]     Chief Complaint: Right sided flank pain  History of Present Illness: Patrick [name] is a 51yo white male, with a PMH of HIV, CKD, DM, HTN, and amemia who was recently discharged on 9/18 after an acute SIRs reaction of unknown source. 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 He had a renal biopsy on 9/13 to confirm and the results are pending. His baseline Cr is 1.5 (1.4-1.6) and has proteinuria at baseline. He states he has been feeling better since discharge and worked a half day on Saturday. Yesterday evening he was eating dinner and he felt, sudden, sharp right flank pain that radiated around to his right rib cage and RUQ. He states the pain was 10/10. He reports vomiting after the pain started but has not since then. He reports some nausea and headache that have been relieved with medication. A CT scan performed in the ED revealed a right side subscapular renal hematoma and retroperitoneal hematoma.  Baseline Cr 1.2 - 1.9 and occ ^.  V after pain, he feels is GB Meds: Prior to Admission medications   Not on File   Current facility-administered medications:0.9 %  sodium chloride infusion, , Intravenous, Continuous, Patrick Jakes, MD, Last Rate: 100 mL/hr at 06/20/12 1249, 1,000 mL at 06/20/12 1249;  acetaminophen (TYLENOL) suppository 650 mg, 650 mg, Rectal, Q6H PRN, Patrick Spillers, MD;  acetaminophen (TYLENOL) tablet 650 mg, 650 mg, Oral, Q6H PRN, Patrick Spillers, MD, 650 mg at 06/20/12 0859 HYDROmorphone (DILAUDID) injection 1 mg, 1 mg, Intravenous, Once, Patrick Jakes, MD, 1 mg at 06/19/12 2334;  morphine 2 MG/ML  injection 2-4 mg, 2-4 mg, Intravenous, Q3H PRN, Patrick Spillers, MD, 2 mg at 06/20/12 0351;  ondansetron [facility]) injection 4 mg, 4 mg, Intravenous, Once, Patrick Jakes, MD, 4 mg at 06/19/12 2333;  ondansetron (ZOFRAN) injection 4 mg, 4 mg, Intravenous, Q6H PRN, Patrick Spillers, MD ondansetron (ZOFRAN) tablet 4 mg, 4 mg, Oral, Q6H PRN, Patrick Spillers, MD;  sodium chloride 0.9 % bolus 500 mL, 500 mL, Intravenous, Once, Patrick Jakes, MD, 500 mL at 06/19/12 2331;  sodium chloride 0.9 % injection 3 mL, 3 mL, Intravenous, Q12H, Patrick Spillers, MD;  DISCONTD: dapsone tablet 100 mg, 100 mg, Oral, Daily, Patrick Spillers, MD  Allergies: Codeine and Sulfonamide derivatives Past Medical History  Diagnosis Date  . Hypertension   . HIV (human immunodeficiency virus infection) 2010  . Hypogonadism male   . CKD (chronic kidney disease) stage 3, GFR 30-59 ml/min     hydrated cr 1.5 gfr 48  . Kidney stones     "quite a few times"  . High cholesterol   . Heart murmur   . Exertional dyspnea 02/25/12    "and lying down"  . Anemia   . Blood transfusion   . Hernia     "surgical; where appendix ruptured"  . Pancytopenia with fever 02/26/2012  . LYMPHADENOPATHY, DIFFUSE 11/18/2010    Qualifier: Diagnosis of  By: Patrick Aland NP, Patrick [name]      Past Surgical History  Procedure Date  . Lithotripsy 2010  . Appendectomy 2001    via midline  incision  . Axillary lymph node dissection 2011; 2012    left   Family History  Problem Relation Age of Onset  . Adopted: Yes   History   Social History  . Marital Status: Single    Spouse Name: N/A    Number of Children: N/A  . Years of Education: N/A   Occupational History  . Not on file.   Social History Main Topics  . Smoking status: Never Smoker   . Smokeless tobacco: Never Used  . Alcohol Use: 0.0 oz/week     02/25/12 "couple mixed drinks or beers or glasses of wine/month"  . Drug Use: No  . Sexually Active: Not Currently -- Male partner(s)    Birth  Control/ Protection: Condom     refused condoms   Other Topics Concern  . Not on file   Social History Narrative  . No narrative on file    Review of Systems: Pertinent items are noted in HPI.  Physical Exam: Blood pressure 162/63, pulse 68, temperature 98.2 F (36.8 C), temperature source Oral, resp. rate 20, height 6' 0.05" (1.83 m), weight 112.2 kg (247 lb 5.7 oz), SpO2 96.00%. BP 162/63  Pulse 68  Temp 98.2 F (36.8 C) (Oral)  Resp 20  Ht 6' 0.05" (1.83 m)  Wt 112.2 kg (247 lb 5.7 oz)  BMI 33.50 kg/m2  SpO2 96%  General Appearance:    Alert, cooperative, white male sitting comfortably in bed. NAD  Head:    Normocephalic, without obvious abnormality, atraumatic Pharynx with out exudates  Eyes:    PERRL,EOM's intact,      Throat:   Lips, mucosa, and tongue normal; teeth and gums normal. No erythema or lesions noted  Neck:   Supple, symmetrical, trachea midline, cervical and supraclavicular adenopathy present;      Back:      CVA tenderness present on right side  Lungs:     Clear to auscultation bilaterally, respirations unlabored decreased bs, decreased expansion  Chest wall:    No tenderness or deformity  Heart:    Regular rate and rhythm, S1 and S2 normal, no murmur, rub   or gallop Gr 2/6 M 1 pluse edema  Abdomen:     Soft, bowel sounds active all four quadrants, distended, TTP in RUQ and RLQ, guarding on right side Liver down 5 cm, ? Palp spleen  Mild flank tender, obese.  Extremities:   Extremities normal, atraumatic, no cyanosis or edema bilaterally, 2+ dorsalis pedis pulses bilaterally   Skin:   no rashes or lesions noted pale  Neurologic:   CNII-XII grossly intact. Oriented x3    Lab results: Basic Metabolic Panel:  Lab 06/20/12 4540 06/19/12 2213 06/15/12 0620  NA 136 135 136  K 4.3 5.1 4.2  CL 101 96 103  CO2 28 29 28   GLUCOSE 127* 164* 209*  BUN 22 19 14   CREATININE 1.98* 1.68* 1.23  CALCIUM 9.0 9.7 8.8  ALB -- -- --  PHOS -- -- --   Liver Function  Tests:  Lab 06/19/12 2213  AST 12  ALT 6  ALKPHOS 61  BILITOT 0.3  PROT 9.1*  ALBUMIN 3.2*    Lab 06/19/12 2213  LIPASE 21  AMYLASE --    CBC:  Lab 06/20/12 1000 06/20/12 0344 06/19/12 2213 06/15/12 0620 06/14/12 0545  WBC 6.3 9.0 12.3* -- --  NEUTROABS -- 5.3 9.3* -- --  HGB 7.4* 7.5* 9.0* -- --  HCT 23.6* 23.5* 28.0* -- --  MCV  90.4 90.7 90.3 92.0 90.2  PLT 214 240 258 -- --   Blood Culture    Component Value Date/Time   SDES BLOOD LEFT ARM 06/05/2012 2220   SPECREQUEST BOTTLES DRAWN AEROBIC AND ANAEROBIC 10CC EACH 06/05/2012 2220   CULT NO GROWTH 5 DAYS 06/05/2012 2220   REPTSTATUS 06/12/2012 FINAL 06/05/2012 2220   CBG:  Lab 06/15/12 1157 06/15/12 0730 06/14/12 2056 06/14/12 1721 06/14/12 1136  GLUCAP 154* 167* 161* 197* 198*   Studies/Results: Ct Abdomen Pelvis Wo Contrast  06/19/2012  *RADIOLOGY REPORT*  Clinical Data: Right flank pain radiating around the right side of the abdomen.  Sudden onset right-sided flank pain.  Vomiting.  CT ABDOMEN AND PELVIS WITHOUT CONTRAST  Technique:  Multidetector CT imaging of the abdomen and pelvis was performed following the standard protocol without intravenous contrast.  Comparison: 02/25/2012.  Findings: Dependent atelectasis the lung bases.  Small amount of pericardial fluid or thickening.  Partially visualized left gynecomastia.  Small amount of fluid is present around the liver margin. Calcified gallstone.  Splenomegaly.  Stable periaortic adenopathy.  There is a right subcapsular renal hematoma measuring 4.2 cm in thickness, 9 cm AP and about 9.4 cm cranial-caudal.  There is also retroperitoneal hematoma tracking cranially that is distinct from the right adrenal gland. Retroperitoneal hematoma component measures 5.8 cm transverse, 12.5 cm AP and 12.8 cm cranial-caudal. There is no left retroperitoneal hematoma.  Nonobstructing right renal collecting system calculi are present. Mass effect on the right kidney from the subcapsular  hematoma is moderate.  There is no effacement of central sinus fat.  The left kidney is atrophic.  Abdominal aortic atherosclerosis.  Stomach, small and large bowel grossly appears within normal limits. Minimal free fluid in the anatomic pelvis.  No aggressive osseous lesions.  L5-S1 predominant lumbar spondylosis.  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  IMPRESSION: 1.  Right retroperitoneal hematoma and right renal subcapsular hematoma. Correlating with recent procedural history, this may represent post biopsy hematoma. Maximal thickness of right renal subcapsular hematoma is 42 mm with moderate mass effect on the right kidney. 2.  Unchanged cholelithiasis and nonobstructing right renal calculi. 3.  Splenomegaly. 4.  Unchanged periaortic adenopathy, s some of which may now be reactive. 5.  Left renal atrophy.   Original Report Authenticated By: Andreas Newport, M.D.    Assessment & Plan by Problem: Mr. Fitzwater is a 51yo white male, with a PMH of HIV, CKD, DM, HTN, and amemia who was admitted for flank pain and CT findings of right side subcapsular renal hematoma and retroperitoneal hematoma s/p renal biopsy on 9/13.  1. Subcapsular renal hematoma and retroperitoneal hematoma - s/p renal biopsy on 9/13. Some bleeding is expected after biopsy. Pt is able to ambulate, hemoglobin has dropped slightly from 8.0-7.4 but is stable from admission, no leukocytosis, no hydronephrosis seen on CT scan  - no intervention at this time will continue to monitor his CBC and CMET Conservative mgmt indicated.    2. CKD- pts Cr and BUN are stable based on his previous labs and baseline Cr of around 1.5. Pt has proteinuria at baseline. His UA today shows large blood, RBCs, and granular casts as expected.  -no intervention at this time will continue to monitor his CBC and CMET   3. HIV- pt has discontinued Tenofovir following kidney injury.   - continue with Infectious disease recommendations  4. DM- pts BGs are  improved from his previous hospitalization  - continue diabetic diet, exercise, and regular  monitoring  5. Anemia- pt has chronic anemia with a hemoglobin at baseline around 8.0. Is slightly lower today but stable from admission.  - no intervention at this time will continue to monitor his CBC and CMET 6 Obesity  This is a Psychologist, occupational Note.  The care of the patient was discussed with Dr. Darrick Penna and the assessment and plan was formulated with their assistance.  Please see their note for official documentation of the patient encounter.   Signed: V. [place], [place], PA-S2 06/20/2012, 2:53 PM  I have seen and examined this patient and agree with the plan of care seen and eval.  Changes above.  Follow conservatively .  Brynden Thune L 06/20/2012, 4:15 PM

## 2012-06-20 NOTE — Progress Notes (Signed)
Patient ID: Patrick [name], male   DOB: 24-Feb-1961, 51 y.o.   MRN: [id number] Request received to evaluate pt secondary to recent finding of large RP/perinephric/subcapsular right renal hematoma following random right renal biopsy performed on 06/10/12. Pt's renal biopsy was performed without immediate complications. However, he developed acute onset of nausea/vomiting and mod to severe RUQ/rt flank pain last night which prompted visit to ER and findings as above. No reports of hematuria. Additional PMH as below. Exam: pt awake/alert; chest - dim BS rt base, heart-RRR; abd- soft, +BS, mod tender RUQ/RLQ regions, puncture site rt flank clean and dry, no visible ecchymosis but tender to palpation.    Filed Vitals:   06/20/12 0230 06/20/12 0606 06/20/12 1000 06/20/12 1400  BP: 105/51 120/73 118/70 162/63  Pulse: 74 89 68 68  Temp: 97.7 F (36.5 C) 98.4 F (36.9 C) 98.3 F (36.8 C) 98.2 F (36.8 C)  TempSrc: Oral Oral Oral Oral  Resp: 18 20 18 20   Height: 6' 0.05" (1.83 m)     Weight: 247 lb 5.7 oz (112.2 kg)     SpO2: 97% 96% 94% 96%   Past Medical History  Diagnosis Date  . Hypertension   . HIV (human immunodeficiency virus infection) 2010  . Hypogonadism male   . CKD (chronic kidney disease) stage 3, GFR 30-59 ml/min     hydrated cr 1.5 gfr 48  . Kidney stones     "quite a few times"  . High cholesterol   . Heart murmur   . Exertional dyspnea 02/25/12    "and lying down"  . Anemia   . Blood transfusion   . Hernia     "surgical; where appendix ruptured"  . Pancytopenia with fever 02/26/2012  . LYMPHADENOPATHY, DIFFUSE 11/18/2010    Qualifier: Diagnosis of  By: Sundra Aland NP, Malvin Johns      Past Surgical History  Procedure Date  . Lithotripsy 2010  . Appendectomy 2001    via midline incision  . Axillary lymph node dissection 2011; 2012    left   Ct Abdomen Pelvis Wo Contrast  06/19/2012  *RADIOLOGY REPORT*  Clinical Data: Right flank pain radiating around the right side of the  abdomen.  Sudden onset right-sided flank pain.  Vomiting.  CT ABDOMEN AND PELVIS WITHOUT CONTRAST  Technique:  Multidetector CT imaging of the abdomen and pelvis was performed following the standard protocol without intravenous contrast.  Comparison: 02/25/2012.  Findings: Dependent atelectasis the lung bases.  Small amount of pericardial fluid or thickening.  Partially visualized left gynecomastia.  Small amount of fluid is present around the liver margin. Calcified gallstone.  Splenomegaly.  Stable periaortic adenopathy.  There is a right subcapsular renal hematoma measuring 4.2 cm in thickness, 9 cm AP and about 9.4 cm cranial-caudal.  There is also retroperitoneal hematoma tracking cranially that is distinct from the right adrenal gland. Retroperitoneal hematoma component measures 5.8 cm transverse, 12.5 cm AP and 12.8 cm cranial-caudal. There is no left retroperitoneal hematoma.  Nonobstructing right renal collecting system calculi are present. Mass effect on the right kidney from the subcapsular hematoma is moderate.  There is no effacement of central sinus fat.  The left kidney is atrophic.  Abdominal aortic atherosclerosis.  Stomach, small and large bowel grossly appears within normal limits. Minimal free fluid in the anatomic pelvis.  No aggressive osseous lesions.  L5-S1 predominant lumbar spondylosis.  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  IMPRESSION: 1.  Right retroperitoneal hematoma and right renal subcapsular hematoma.  Correlating with recent procedural history, this may represent post biopsy hematoma. Maximal thickness of right renal subcapsular hematoma is 42 mm with moderate mass effect on the right kidney. 2.  Unchanged cholelithiasis and nonobstructing right renal calculi. 3.  Splenomegaly. 4.  Unchanged periaortic adenopathy, s some of which may now be reactive. 5.  Left renal atrophy.   Original Report Authenticated By: Andreas Newport, M.D.    Dg Chest 2 View  06/11/2012   *RADIOLOGY REPORT*  Clinical Data: Shortness of breath.  Possible vasculitis.  HIV. Hypertension.  CHEST - 2 VIEW  Comparison: 06/10/2012  Findings: Cardiac and mediastinal contours appear unremarkable.  Linear retrocardiac opacities most attributable to vasculature. Trace thickening of the left major fissure on the lateral projection.  Posterior pleural thickening bilaterally suggests tiny bilateral pleural effusions, although there is no overt blunting of the costophrenic angles.  No airway thickening noted.  IMPRESSION:  1.  Suspected trace bilateral pleural effusions.   Otherwise, no significant abnormality identified.   Original Report Authenticated By: Dellia Cloud, M.D.    Dg Chest 2 View  06/05/2012  *RADIOLOGY REPORT*  Clinical Data: Fever.  Cough.  Swollen lymph nodes.  Chest pain  CHEST - 2 VIEW  Comparison: 02/22/2012.  Findings: Slightly shallow inspiration. The heart size and pulmonary vascularity are normal. The lungs appear clear and expanded without focal air space disease or consolidation. No blunting of the costophrenic angles.  No pneumothorax.  Mediastinal contours appear intact.  No significant changes since previous study.  Surgical clips in the left axilla.  IMPRESSION: No evidence of active pulmonary disease.   Original Report Authenticated By: Marlon Pel, M.D.    US Abdomen Complete  06/07/2012  *RADIOLOGY REPORT*  Clinical Data:  Acute renal failure.  History kidney stones.  HIV with lymphadenopathy, splenomegaly, and fevers.  COMPLETE ABDOMINAL ULTRASOUND  Comparison:  Renal ultrasound 02/23/2012.  CT of the abdomen pelvis without contrast 11/22/2010.  Findings:  Gallbladder:  A 2.7 cm shadowing stone is stable.  There is no evidence for cholecystitis.  The gallbladder wall thickness is within normal limits at 2 mm.  Common bile duct:  Normal in caliber. No biliary ductal dilation.The maximal diameter is 3 mm.  Liver:  No focal lesion identified.  Within normal limits  in parenchymal echogenicity.  IVC:  Appears normal.  Pancreas:  Although the pancreas is difficult to visualize in its entirety, no focal pancreatic abnormality is identified.  Spleen:  Spleen is markedly enlarged, measuring at least 15.8 cm. It is difficult to that the entire spleen lung field of view.  Right Kidney:  85 mm nonobstructing calculus at the upper pole of the right kidney is stable.  There is no hydronephrosis.  The right kidney length is 14.4 cm.  Left Kidney:  No hydronephrosis.  Well-preserved cortex.  Normal size and parenchymal echotexture without focal abnormalities. The maximal length is 10.2 cm, stable.  Abdominal aorta:  No aneurysm identified.  IMPRESSION:  1.  Persistent splenomegaly. 2.  2.7 cm shadowing nonobstructing gallstone.  There is no evidence for hydronephrosis. 3.  Stable nonobstructive right upper pole kidney stone.   Original Report Authenticated By: Jamesetta Orleans. MATTERN, M.D.    US Abdomen Limited  06/12/2012  *RADIOLOGY REPORT*  Clinical Data: Abdominal distension.  End-stage renal disease. Question ascites.  LIMITED ABDOMEN ULTRASOUND FOR ASCITES  Technique:  Limited ultrasound survey for ascites was performed in all four abdominal quadrants.  Comparison:  Abdominal ultrasound 06/07/2012.  Findings: Limited evaluation  of all four quadrants was performed. No ascites is demonstrated.  IMPRESSION: No evidence of ascites.   Original Report Authenticated By: Gerrianne Scale, M.D.    US Biopsy  06/10/2012  *RADIOLOGY REPORT*  Clinical Data/Indication: Renal failure  ULTRASOUND-GUIDED RANDOM RENAL CORTEX CORE BIOPSY.  Sedation: Versed 2.0 mg, Fentanyl 50 mcg.  Total Moderate Sedation Time: 13 minutes.  Procedure: The procedure, risks, benefits, and alternatives were explained to the patient. Questions regarding the procedure were encouraged and answered. The patient understands and consents to the procedure.  The right back was prepped with betadine in a sterile fashion,  and a sterile drape was applied covering the operative field. A mask and sterile gloves were used for the procedure.  Under sonographic guidance, three 16 gauge core biopsies of the cortex of the lower pole of the right kidney were obtained. Final imaging was performed.  Patient tolerated the procedure well without complication.  Vital sign monitoring by nursing staff during the procedure will continue as patient is in the special procedures unit for post procedure observation.  Findings: The images document guide needle placement within the right kidney lower pole cortex. Post biopsy images demonstrate no hemorrhage.  IMPRESSION: Successful ultrasound-guided renal core biopsy.   Original Report Authenticated By: Donavan Burnet, M.D.    Dg Chest Portable 1 View  06/10/2012  *RADIOLOGY REPORT*  Clinical Data: Fever, cough  PORTABLE CHEST - 1 VIEW  Comparison: 06/05/2012  Findings: Lung volumes are low with crowding of the bronchovascular markings.  Heart size upper limits of normal.  Right costophrenic angle omitted in the field of view.  No focal pulmonary opacity. No pleural effusion.  No acute osseous finding.  IMPRESSION: No focal acute finding. If the patient's symptoms continue, consider PA and lateral chest radiographs obtained at full inspiration when the patient is clinically able.   Original Report Authenticated By: Harrel Lemon, M.D.    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  HIV.  CT MAXILLOFACIAL WITHOUT CONTRAST  Technique:  Multidetector CT imaging of the maxillofacial structures was performed. Multiplanar CT image reconstructions were also generated.  Comparison: None.  Findings: Visualized noncontrast brain parenchyma is within normal limits. Calcified atherosclerosis at the skull base.  Widespread upper cervical lymphadenopathy partially visible.  Among the largest visible nodes are of left level be,  20 mm short axis.  Tonsillar and adenoid hypertrophy also noted and may be related to the lymphoproliferative disorder.  Visualized orbit soft tissues are within normal limits.  Visualized tympanic cavities and mastoids are clear.  Minimal sphenoid sinus mucosal thickening. Mild to moderate ethmoid sinus mucosal thickening. Frontal sinuses are clear. Moderate bilateral maxillary sinus mucosal thickening which is mostly dependent and at least in part related to mucous retention cyst. Mucosal thickening at both [facility] greater on the left.  The nasal septum is intact.  No osseous erosions.  Negative nasal cavity. No acute osseous abnormality identified.  IMPRESSION: 1.  Mild to moderate conventional appearing ethmoid and maxillary sinus mucosal thickening.  No imaging features of Wegener's granulomatosis identified. 2.  Cervical lymphadenopathy partially visible.  Adenoid and tonsillar hypertrophy.  Favor lymphoproliferative disorder related to HIV, but lymphoma / leukemia not excluded.   Original Report Authenticated By: Harley Hallmark, M.D.   Results for orders placed during the hospital encounter of 06/19/12  COMPREHENSIVE METABOLIC PANEL      Component Value Range   Sodium 135  135 -  145 mEq/L   Potassium 5.1  3.5 - 5.1 mEq/L   Chloride 96  96 - 112 mEq/L   CO2 29  19 - 32 mEq/L   Glucose, Bld 164 (*) 70 - 99 mg/dL   BUN 19  6 - 23 mg/dL   Creatinine, Ser 8.11 (*) 0.50 - 1.35 mg/dL   Calcium 9.7  8.4 - 91.4 mg/dL   Total Protein 9.1 (*) 6.0 - 8.3 g/dL   Albumin 3.2 (*) 3.5 - 5.2 g/dL   AST 12  0 - 37 U/L   ALT 6  0 - 53 U/L   Alkaline Phosphatase 61  39 - 117 U/L   Total Bilirubin 0.3  0.3 - 1.2 mg/dL   GFR calc non Af Amer 46 (*) >90 mL/min   GFR calc Af Amer 53 (*) >90 mL/min  CBC WITH DIFFERENTIAL      Component Value Range   WBC 12.3 (*) 4.0 - 10.5 K/uL   RBC 3.10 (*) 4.22 - 5.81 MIL/uL   Hemoglobin 9.0 (*) 13.0 - 17.0 g/dL   HCT 78.2 (*) 95.6 - 21.3 %   MCV 90.3  78.0 - 100.0 fL   MCH  29.0  26.0 - 34.0 pg   MCHC 32.1  30.0 - 36.0 g/dL   RDW 08.6  57.8 - 46.9 %   Platelets 258  150 - 400 K/uL   Neutrophils Relative 76  43 - 77 %   Neutro Abs 9.3 (*) 1.7 - 7.7 K/uL   Lymphocytes Relative 18  12 - 46 %   Lymphs Abs 2.2  0.7 - 4.0 K/uL   Monocytes Relative 6  3 - 12 %   Monocytes Absolute 0.7  0.1 - 1.0 K/uL   Eosinophils Relative 0  0 - 5 %   Eosinophils Absolute 0.0  0.0 - 0.7 K/uL   Basophils Relative 0  0 - 1 %   Basophils Absolute 0.0  0.0 - 0.1 K/uL  LIPASE, BLOOD      Component Value Range   Lipase 21  11 - 59 U/L  URINALYSIS, ROUTINE W REFLEX MICROSCOPIC      Component Value Range   Color, Urine YELLOW  YELLOW   APPearance CLOUDY (*) CLEAR   Specific Gravity, Urine 1.018  1.005 - 1.030   pH 6.0  5.0 - 8.0   Glucose, UA NEGATIVE  NEGATIVE mg/dL   Hgb urine dipstick LARGE (*) NEGATIVE   Bilirubin Urine NEGATIVE  NEGATIVE   Ketones, ur NEGATIVE  NEGATIVE mg/dL   Protein, ur 629 (*) NEGATIVE mg/dL   Urobilinogen, UA 0.2  0.0 - 1.0 mg/dL   Nitrite NEGATIVE  NEGATIVE   Leukocytes, UA NEGATIVE  NEGATIVE  PROTIME-INR      Component Value Range   Prothrombin Time 14.5  11.6 - 15.2 seconds   INR 1.15  0.00 - 1.49  URINE MICROSCOPIC-ADD ON      Component Value Range   Squamous Epithelial / LPF RARE  RARE   WBC, UA 0-2  <3 WBC/hpf   RBC / HPF TOO NUMEROUS TO COUNT  <3 RBC/hpf   Bacteria, UA FEW (*) RARE   Casts GRANULAR CAST (*) NEGATIVE   Urine-Other MUCOUS PRESENT    TYPE AND SCREEN      Component Value Range   ABO/RH(D) O POS     Antibody Screen NEG     Sample Expiration 06/23/2012     Unit Number [id number]     Blood Component  Type RED CELLS,LR     Unit division 00     Status of Unit ALLOCATED     Transfusion Status OK TO TRANSFUSE     Crossmatch Result COMPATIBLE     Unit Number [id number]     Blood Component Type RED CELLS,LR     Unit division 00     Status of Unit ALLOCATED     Transfusion Status OK TO TRANSFUSE     Crossmatch Result  COMPATIBLE    CBC WITH DIFFERENTIAL      Component Value Range   WBC 9.0  4.0 - 10.5 K/uL   RBC 2.59 (*) 4.22 - 5.81 MIL/uL   Hemoglobin 7.5 (*) 13.0 - 17.0 g/dL   HCT 19.6 (*) 22.2 - 97.9 %   MCV 90.7  78.0 - 100.0 fL   MCH 29.0  26.0 - 34.0 pg   MCHC 31.9  30.0 - 36.0 g/dL   RDW 89.2  11.9 - 41.7 %   Platelets 240  150 - 400 K/uL   Neutrophils Relative 59  43 - 77 %   Neutro Abs 5.3  1.7 - 7.7 K/uL   Lymphocytes Relative 34  12 - 46 %   Lymphs Abs 3.1  0.7 - 4.0 K/uL   Monocytes Relative 7  3 - 12 %   Monocytes Absolute 0.6  0.1 - 1.0 K/uL   Eosinophils Relative 0  0 - 5 %   Eosinophils Absolute 0.0  0.0 - 0.7 K/uL   Basophils Relative 0  0 - 1 %   Basophils Absolute 0.0  0.0 - 0.1 K/uL  BASIC METABOLIC PANEL      Component Value Range   Sodium 136  135 - 145 mEq/L   Potassium 4.3  3.5 - 5.1 mEq/L   Chloride 101  96 - 112 mEq/L   CO2 28  19 - 32 mEq/L   Glucose, Bld 127 (*) 70 - 99 mg/dL   BUN 22  6 - 23 mg/dL   Creatinine, Ser 4.08 (*) 0.50 - 1.35 mg/dL   Calcium 9.0  8.4 - 14.4 mg/dL   GFR calc non Af Amer 37 (*) >90 mL/min   GFR calc Af Amer 43 (*) >90 mL/min  CBC      Component Value Range   WBC 6.3  4.0 - 10.5 K/uL   RBC 2.61 (*) 4.22 - 5.81 MIL/uL   Hemoglobin 7.4 (*) 13.0 - 17.0 g/dL   HCT 81.8 (*) 56.3 - 14.9 %   MCV 90.4  78.0 - 100.0 fL   MCH 28.4  26.0 - 34.0 pg   MCHC 31.4  30.0 - 36.0 g/dL   RDW 70.2 (*) 63.7 - 85.8 %   Platelets 214  150 - 400 K/uL  PREPARE RBC (CROSSMATCH)      Component Value Range   Order Confirmation ORDER PROCESSED BY BLOOD BANK     A/P: Pt with hx acute on chronic renal failure, HIV, s/p US guided random core right renal biopsy 06/10/12. Now admitted with acute right abd/rt flank pain, N/V, lightheadedness and finding of large RP/perinephric/subcapsular right renal hematoma. Hgb 7.4, plts 214k, creatinine 1.98 and nl PT/INR. Case has been discussed with Dr. Jake Bathe) and primary service. Plan at this time is to transfuse,  hydrate and monitor serial H/H. If Hgb continues to decline despite above measures, consideration will be given to perform renal arteriography with possible embolization if needed( ? ruptured pseudoaneurysm). Details/risks of procedure ( including, but not  limited to, internal bleeding, worsening renal failure with need for hemodialysis, infection, and inability to locate source of bleed) d/w pt with his understanding and consent.

## 2012-06-20 NOTE — ED Notes (Signed)
Patient currently resting quietly in bed; no respiratory or acute distress noted.  Patient updated on plan of care; informed patient that we are currently waiting on further orders from EDP.  Patient has no other questions or concerns at this time; will continue to monitor. 

## 2012-06-20 NOTE — Progress Notes (Signed)
Received patient from ED per bed accompanied by significant others. Alert/oriented x4. Not in respiratory distress, complaining of headache 4/10 scale. Placed in bed comfortably, call bell within reach. Side rails up 2/4. Will continue to monitor. Report given by [facility]

## 2012-06-20 NOTE — Progress Notes (Signed)
Subjective:    Interval Events:   His right abdominal pain is better today to a scale of 5/10.  No new complaints today. No vomiting or nausea.    Objective:    Vital Signs:   Temp:  [97.7 F (36.5 C)-98.4 F (36.9 C)] 98.1 F (36.7 C) (09/23 1741) Pulse Rate:  [67-89] 67  (09/23 1741) Resp:  [16-20] 18  (09/23 1741) BP: (105-162)/(51-88) 125/75 mmHg (09/23 1741) SpO2:  [94 %-100 %] 98 % (09/23 1741) Weight:  [247 lb 5.7 oz (112.2 kg)] 247 lb 5.7 oz (112.2 kg) (09/23 0230) Last BM Date: 06/19/12   Weights: 24-hour Weight change:   Filed Weights   06/20/12 0230  Weight: 247 lb 5.7 oz (112.2 kg)     Intake/Output:   Intake/Output Summary (Last 24 hours) at 06/20/12 1843 Last data filed at 06/20/12 1700  Gross per 24 hour  Intake      0 ml  Output      0 ml  Net      0 ml       Physical Exam: General appearance: alert, cooperative and no distress Head: Normocephalic, without obvious abnormality, atraumatic Back: symmetric, no curvature. ROM normal. No CVA tenderness., Has right CVA tenderness Resp: clear to auscultation bilaterally Cardio: regular rate and rhythm, S1, S2 normal, no murmur, click, rub or gallop GI: normal findings: tenderness in the right lumber region  Extremities: extremities normal, atraumatic, no cyanosis or edema Pulses: 2+ and symmetric Neurologic: Alert and oriented X 3, normal strength and tone. Normal symmetric reflexes. Normal coordination and gait    Labs: Basic Metabolic Panel:  Lab 06/20/12 1191 06/19/12 2213 06/15/12 0620 06/14/12 0545  NA 136 135 136 134*  K 4.3 5.1 4.2 4.6  CL 101 96 103 102  CO2 28 29 28 27   GLUCOSE 127* 164* 209* 231*  BUN 22 19 14 17   CREATININE 1.98* 1.68* 1.23 1.14  CALCIUM 9.0 9.7 8.8 --  MG -- -- -- --  PHOS -- -- -- --    Liver Function Tests:  Lab 06/19/12 2213  AST 12  ALT 6  ALKPHOS 61  BILITOT 0.3  PROT 9.1*  ALBUMIN 3.2*    Lab 06/19/12 2213  LIPASE 21  AMYLASE --   No  results found for this basename: AMMONIA:3 in the last 168 hours  CBC:  Lab 06/20/12 1611 06/20/12 1000 06/20/12 0344 06/19/12 2213 06/15/12 0620  WBC 6.2 6.3 9.0 12.3* 3.4*  NEUTROABS -- -- 5.3 9.3* --  HGB 7.6* 7.4* 7.5* 9.0* 8.2*  HCT 23.7* 23.6* 23.5* 28.0* 25.3*  MCV 90.8 90.4 90.7 90.3 92.0  PLT 221 214 240 258 149*    Cardiac Enzymes: No results found for this basename: CKTOTAL:5,CKMB:5,CKMBINDEX:5,TROPONINI:5 in the last 168 hours  BNP: No components found with this basename: POCBNP:5  CBG:  Lab 06/15/12 1157 06/15/12 0730 06/14/12 2056 06/14/12 1721 06/14/12 1136  GLUCAP 154* 167* 161* 197* 198*    Coagulation Studies:  Basename 06/20/12 0053  LABPROT 14.5  INR 1.15    Microbiology: Results for orders placed during the hospital encounter of 06/05/12  CULTURE, BLOOD (ROUTINE X 2)     Status: Normal   Collection Time   06/05/12 10:10 PM      Component Value Range Status Comment   Specimen Description BLOOD RIGHT ARM   Final    Special Requests BOTTLES DRAWN AEROBIC AND ANAEROBIC 10CC EACH   Final    Culture  Setup Time  06/06/2012 08:11   Final    Culture NO GROWTH 5 DAYS   Final    Report Status 06/12/2012 FINAL   Final   CULTURE, BLOOD (ROUTINE X 2)     Status: Normal   Collection Time   06/05/12 10:20 PM      Component Value Range Status Comment   Specimen Description BLOOD LEFT ARM   Final    Special Requests BOTTLES DRAWN AEROBIC AND ANAEROBIC 10CC EACH   Final    Culture  Setup Time 06/06/2012 08:12   Final    Culture NO GROWTH 5 DAYS   Final    Report Status 06/12/2012 FINAL   Final   MRSA PCR SCREENING     Status: Normal   Collection Time   06/06/12  1:05 AM      Component Value Range Status Comment   MRSA by PCR NEGATIVE  NEGATIVE Final     Imaging: Ct Abdomen Pelvis Wo Contrast  06/19/2012  *RADIOLOGY REPORT*  Clinical Data: Right flank pain radiating around the right side of the abdomen.  Sudden onset right-sided flank pain.  Vomiting.  CT  ABDOMEN AND PELVIS WITHOUT CONTRAST  Technique:  Multidetector CT imaging of the abdomen and pelvis was performed following the standard protocol without intravenous contrast.  Comparison: 02/25/2012.  Findings: Dependent atelectasis the lung bases.  Small amount of pericardial fluid or thickening.  Partially visualized left gynecomastia.  Small amount of fluid is present around the liver margin. Calcified gallstone.  Splenomegaly.  Stable periaortic adenopathy.  There is a right subcapsular renal hematoma measuring 4.2 cm in thickness, 9 cm AP and about 9.4 cm cranial-caudal.  There is also retroperitoneal hematoma tracking cranially that is distinct from the right adrenal gland. Retroperitoneal hematoma component measures 5.8 cm transverse, 12.5 cm AP and 12.8 cm cranial-caudal. There is no left retroperitoneal hematoma.  Nonobstructing right renal collecting system calculi are present. Mass effect on the right kidney from the subcapsular hematoma is moderate.  There is no effacement of central sinus fat.  The left kidney is atrophic.  Abdominal aortic atherosclerosis.  Stomach, small and large bowel grossly appears within normal limits. Minimal free fluid in the anatomic pelvis.  No aggressive osseous lesions.  L5-S1 predominant lumbar spondylosis.  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  IMPRESSION: 1.  Right retroperitoneal hematoma and right renal subcapsular hematoma. Correlating with recent procedural history, this may represent post biopsy hematoma. Maximal thickness of right renal subcapsular hematoma is 42 mm with moderate mass effect on the right kidney. 2.  Unchanged cholelithiasis and nonobstructing right renal calculi. 3.  Splenomegaly. 4.  Unchanged periaortic adenopathy, s some of which may now be reactive. 5.  Left renal atrophy.   Original Report Authenticated By: Andreas Newport, M.D.       Medications:    Infusions:    . sodium chloride 1,000 mL (06/20/12 1249)      Scheduled Medications:    . HYDROmorphone  1 mg Intravenous Once  . insulin aspart  0-5 Units Subcutaneous QHS  . insulin aspart  0-9 Units Subcutaneous TID WC  . ondansetron  4 mg Intravenous Once  . sodium chloride  500 mL Intravenous Once  . sodium chloride  3 mL Intravenous Q12H  . DISCONTD: dapsone  100 mg Oral Daily     PRN Medications: acetaminophen, acetaminophen, morphine injection, ondansetron (ZOFRAN) IV, ondansetron   Assessment/ Plan:   Patrick [name] is a 51 year old gentleman with a history of HIV, CKD,  multiple admissions for diffuse lymphadenopathy and pancytopenia, as well as recent renal biopsy on 06/10/2012, who presents with sudden onset right-sided back pain and CT findings concerning for renal subcapsular hematoma.  Right Kidney Hematoma 2/2 renal bx on the 9/13: CT abdomen and pelvis shows 4.2 x 9 x 9.2 cm subcapsular right renal hematoma and 12.8 x 12.5x 5.8 centimeter right retroperitoneal hematoma with moderate mass effect. The pain is better. No new complaints Plan - F/u CBC 6hrly today and if stable 12hrly tomorrow - Booked two units of PRBC in case he needs transfusion (hb <7 of symptomatic anemia) - Renal and IR recommending conservative management at this time - Will continue with IV fluids as needed.  AKI: Creatine increased to 1.68 from 1.28 at discharge last week. Possible cause include hematomacompressing the kidney. Renal bx showed ATN from Tenofovir toxicity. U/A cloudy and proteinuria.  -Will monitor cr levels - Nephrology following up. -Strict I/Os  - Withhold tenofovir - discontinue dapsone as this might also be contributing to his renal insuffiency   HIV: HIV- Recent CD4 count 183< 370< 210<110.  Followed by Dr. Orvan [name]. Compliant with medications. 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   DM: On insulin sliding scale. HbA1C 7.1 in 9/9  DVT: SCDS  Length of Stay: 1  days   Signed by:  Dow Adolph PGY-I, Internal Medicine Pager [phone] 06/20/2012, 6:43 PM

## 2012-06-20 NOTE — H&P (Signed)
Internal Medicine Teaching Service Attending Note Date: 06/20/2012  Patient name: Patrick [name]  Medical record number: [id number]  Date of birth: 1961-06-29   I have seen and evaluated Patrick [name] and discussed their care with the Residency Team.    Patrick [name] is a 51yo white man, well known to our service, who was recently discharged after an acute SIRs reaction of unknown source, ? Dapsone hypersensitivity.  He had a renal biopsy during this previous hospitalization to evaluate for vasculitis.  On the evening prior to admission, Patrick [name] was in his usual state of health, he was eating dinner when he felt a sudden, sharp, 10/10 pain at the site of his renal biopsy which eventually wrapped around to the front of his abdomen and into his RUQ.  The pain was constant.  The pain was worse with lying down and movement.  Nothing really relieved the pain.  Associated symptoms included headache and emesis and dizziness.  When I saw him, he was resting comfortably and was tender to palpation at the site of the renal biopsy and his RUQ and RLQ.   A CT scan was performed in the ED which revealed subcapsular renal hematoma and retroperitoneal hematoma on the right.   Physical Exam: Blood pressure 118/70, pulse 68, temperature 98.3 F (36.8 C), temperature source Oral, resp. rate 18, height 6' 0.05" (1.83 m), weight 247 lb 5.7 oz (112.2 kg), SpO2 94.00%. General appearance: alert, cooperative, appears stated age and no distress Head: Normocephalic, without obvious abnormality, atraumatic Eyes: EOMI, PERRL, no conjunctival pallor or icterus Back: symmetric, mild tenderness at site of renal biopsy on the right with palpation Lungs: clear to auscultation bilaterally and mild crackles at bases, no wheeze Heart: regular rate and rhythm, S1, S2 normal and no murmur Abdomen: Soft, mild tenderness to palpation in RUQ/RLQ, no rebound or gaurding Extremities: extremities normal, atraumatic, no cyanosis  or edema and warm Pulses: 2+ and symmetric Skin: Skin color, texture, turgor normal. No rashes or lesions or ecchymoses Lymph nodes: Submandibular, sublingual LN palpable, but much improved from previous, no palpable supraclavicular LNs noted.  Lab results: Results for orders placed during the hospital encounter of 06/19/12 (from the past 24 hour(s))  COMPREHENSIVE METABOLIC PANEL     Status: Abnormal   Collection Time   06/19/12 10:13 PM      Component Value Range   Sodium 135  135 - 145 mEq/L   Potassium 5.1  3.5 - 5.1 mEq/L   Chloride 96  96 - 112 mEq/L   CO2 29  19 - 32 mEq/L   Glucose, Bld 164 (*) 70 - 99 mg/dL   BUN 19  6 - 23 mg/dL   Creatinine, Ser 4.09 (*) 0.50 - 1.35 mg/dL   Calcium 9.7  8.4 - 81.1 mg/dL   Total Protein 9.1 (*) 6.0 - 8.3 g/dL   Albumin 3.2 (*) 3.5 - 5.2 g/dL   AST 12  0 - 37 U/L   ALT 6  0 - 53 U/L   Alkaline Phosphatase 61  39 - 117 U/L   Total Bilirubin 0.3  0.3 - 1.2 mg/dL   GFR calc non Af Amer 46 (*) >90 mL/min   GFR calc Af Amer 53 (*) >90 mL/min  CBC WITH DIFFERENTIAL     Status: Abnormal   Collection Time   06/19/12 10:13 PM      Component Value Range   WBC 12.3 (*) 4.0 - 10.5 K/uL   RBC 3.10 (*)  4.22 - 5.81 MIL/uL   Hemoglobin 9.0 (*) 13.0 - 17.0 g/dL   HCT 16.1 (*) 09.6 - 04.5 %   MCV 90.3  78.0 - 100.0 fL   MCH 29.0  26.0 - 34.0 pg   MCHC 32.1  30.0 - 36.0 g/dL   RDW 40.9  81.1 - 91.4 %   Platelets 258  150 - 400 K/uL   Neutrophils Relative 76  43 - 77 %   Neutro Abs 9.3 (*) 1.7 - 7.7 K/uL   Lymphocytes Relative 18  12 - 46 %   Lymphs Abs 2.2  0.7 - 4.0 K/uL   Monocytes Relative 6  3 - 12 %   Monocytes Absolute 0.7  0.1 - 1.0 K/uL   Eosinophils Relative 0  0 - 5 %   Eosinophils Absolute 0.0  0.0 - 0.7 K/uL   Basophils Relative 0  0 - 1 %   Basophils Absolute 0.0  0.0 - 0.1 K/uL  LIPASE, BLOOD     Status: Normal   Collection Time   06/19/12 10:13 PM      Component Value Range   Lipase 21  11 - 59 U/L  PROTIME-INR     Status:  Normal   Collection Time   06/20/12 12:53 AM      Component Value Range   Prothrombin Time 14.5  11.6 - 15.2 seconds   INR 1.15  0.00 - 1.49  URINALYSIS, ROUTINE W REFLEX MICROSCOPIC     Status: Abnormal   Collection Time   06/20/12  1:43 AM      Component Value Range   Color, Urine YELLOW  YELLOW   APPearance CLOUDY (*) CLEAR   Specific Gravity, Urine 1.018  1.005 - 1.030   pH 6.0  5.0 - 8.0   Glucose, UA NEGATIVE  NEGATIVE mg/dL   Hgb urine dipstick LARGE (*) NEGATIVE   Bilirubin Urine NEGATIVE  NEGATIVE   Ketones, ur NEGATIVE  NEGATIVE mg/dL   Protein, ur 782 (*) NEGATIVE mg/dL   Urobilinogen, UA 0.2  0.0 - 1.0 mg/dL   Nitrite NEGATIVE  NEGATIVE   Leukocytes, UA NEGATIVE  NEGATIVE  URINE MICROSCOPIC-ADD ON     Status: Abnormal   Collection Time   06/20/12  1:43 AM      Component Value Range   Squamous Epithelial / LPF RARE  RARE   WBC, UA 0-2  <3 WBC/hpf   RBC / HPF TOO NUMEROUS TO COUNT  <3 RBC/hpf   Bacteria, UA FEW (*) RARE   Casts GRANULAR CAST (*) NEGATIVE   Urine-Other MUCOUS PRESENT    TYPE AND SCREEN     Status: Normal   Collection Time   06/20/12  3:44 AM      Component Value Range   ABO/RH(D) O POS     Antibody Screen NEG     Sample Expiration 06/23/2012    CBC WITH DIFFERENTIAL     Status: Abnormal   Collection Time   06/20/12  3:44 AM      Component Value Range   WBC 9.0  4.0 - 10.5 K/uL   RBC 2.59 (*) 4.22 - 5.81 MIL/uL   Hemoglobin 7.5 (*) 13.0 - 17.0 g/dL   HCT 95.6 (*) 21.3 - 08.6 %   MCV 90.7  78.0 - 100.0 fL   MCH 29.0  26.0 - 34.0 pg   MCHC 31.9  30.0 - 36.0 g/dL   RDW 57.8  46.9 - 62.9 %   Platelets 240  150 - 400 K/uL   Neutrophils Relative 59  43 - 77 %   Neutro Abs 5.3  1.7 - 7.7 K/uL   Lymphocytes Relative 34  12 - 46 %   Lymphs Abs 3.1  0.7 - 4.0 K/uL   Monocytes Relative 7  3 - 12 %   Monocytes Absolute 0.6  0.1 - 1.0 K/uL   Eosinophils Relative 0  0 - 5 %   Eosinophils Absolute 0.0  0.0 - 0.7 K/uL   Basophils Relative 0  0 - 1 %    Basophils Absolute 0.0  0.0 - 0.1 K/uL  BASIC METABOLIC PANEL     Status: Abnormal   Collection Time   06/20/12  9:28 AM      Component Value Range   Sodium 136  135 - 145 mEq/L   Potassium 4.3  3.5 - 5.1 mEq/L   Chloride 101  96 - 112 mEq/L   CO2 28  19 - 32 mEq/L   Glucose, Bld 127 (*) 70 - 99 mg/dL   BUN 22  6 - 23 mg/dL   Creatinine, Ser 1.61 (*) 0.50 - 1.35 mg/dL   Calcium 9.0  8.4 - 09.6 mg/dL   GFR calc non Af Amer 37 (*) >90 mL/min   GFR calc Af Amer 43 (*) >90 mL/min  CBC     Status: Abnormal   Collection Time   06/20/12 10:00 AM      Component Value Range   WBC 6.3  4.0 - 10.5 K/uL   RBC 2.61 (*) 4.22 - 5.81 MIL/uL   Hemoglobin 7.4 (*) 13.0 - 17.0 g/dL   HCT 04.5 (*) 40.9 - 81.1 %   MCV 90.4  78.0 - 100.0 fL   MCH 28.4  26.0 - 34.0 pg   MCHC 31.4  30.0 - 36.0 g/dL   RDW 91.4 (*) 78.2 - 95.6 %   Platelets 214  150 - 400 K/uL    Imaging results:  Ct Abdomen Pelvis Wo Contrast  06/19/2012  *RADIOLOGY REPORT*  Clinical Data: Right flank pain radiating around the right side of the abdomen.  Sudden onset right-sided flank pain.  Vomiting.  CT ABDOMEN AND PELVIS WITHOUT CONTRAST  Technique:  Multidetector CT imaging of the abdomen and pelvis was performed following the standard protocol without intravenous contrast.  Comparison: 02/25/2012.  Findings: Dependent atelectasis the lung bases.  Small amount of pericardial fluid or thickening.  Partially visualized left gynecomastia.  Small amount of fluid is present around the liver margin. Calcified gallstone.  Splenomegaly.  Stable periaortic adenopathy.  There is a right subcapsular renal hematoma measuring 4.2 cm in thickness, 9 cm AP and about 9.4 cm cranial-caudal.  There is also retroperitoneal hematoma tracking cranially that is distinct from the right adrenal gland. Retroperitoneal hematoma component measures 5.8 cm transverse, 12.5 cm AP and 12.8 cm cranial-caudal. There is no left retroperitoneal hematoma.  Nonobstructing right  renal collecting system calculi are present. Mass effect on the right kidney from the subcapsular hematoma is moderate.  There is no effacement of central sinus fat.  The left kidney is atrophic.  Abdominal aortic atherosclerosis.  Stomach, small and large bowel grossly appears within normal limits. Minimal free fluid in the anatomic pelvis.  No aggressive osseous lesions.  L5-S1 predominant lumbar spondylosis.  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  IMPRESSION: 1.  Right retroperitoneal hematoma and right renal subcapsular hematoma. Correlating with recent procedural history, this may represent post biopsy hematoma. Maximal thickness  of right renal subcapsular hematoma is 42 mm with moderate mass effect on the right kidney. 2.  Unchanged cholelithiasis and nonobstructing right renal calculi. 3.  Splenomegaly. 4.  Unchanged periaortic adenopathy, s some of which may now be reactive. 5.  Left renal atrophy.   Original Report Authenticated By: Andreas Newport, M.D.     Assessment and Plan: I agree with the formulated Assessment and Plan with the following changes:   1. Right Subcapsular renal hematoma with same sided retroperitoneal hematoma with asymptomatic anemia  This likely represents a complication of previous renal biopsy.  Patrick [name] had a history of bone marrow suppression with low platelets and possibly this could have been a slow bleed, allowing for presentation of symptoms so many days past procedure.    - Monitor renal function - Monitor vital signs closely - H/H q6 for one day, if stable, q12h tmw - I H/H < 7 or symptomatic, will transfuse prbc - Monitor I/O's for evaluation of renal function - Nephrology consult  - IR consult to evaluate need for drainage of hematomas  2. Leukocytosis - 12.3 on admission, improved.  Diff normal.  Possibly a stress reaction, but patient previously with leukopenia, so this may be a true indication of an infection vs recovery of his bone  marrow from previous pancytopenia - Will monitor closely.  No current evidence of acute infection  3. 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

## 2012-06-21 DIAGNOSIS — E119 Type 2 diabetes mellitus without complications: Secondary | ICD-10-CM

## 2012-06-21 DIAGNOSIS — N179 Acute kidney failure, unspecified: Secondary | ICD-10-CM

## 2012-06-21 LAB — COMPREHENSIVE METABOLIC PANEL
CO2: 28 mEq/L (ref 19–32)
Calcium: 9.2 mg/dL (ref 8.4–10.5)
Creatinine, Ser: 1.58 mg/dL — ABNORMAL HIGH (ref 0.50–1.35)
GFR calc Af Amer: 57 mL/min — ABNORMAL LOW (ref >90)
GFR calc non Af Amer: 49 mL/min — ABNORMAL LOW (ref >90)
Glucose, Bld: 148 mg/dL — ABNORMAL HIGH (ref 70–99)
Sodium: 135 mEq/L (ref 135–145)
Total Protein: 8.1 g/dL (ref 6.0–8.3)

## 2012-06-21 LAB — CBC
HCT: 23.3 % — ABNORMAL LOW (ref 39.0–52.0)
Hemoglobin: 7.4 g/dL — ABNORMAL LOW (ref 13.0–17.0)
Hemoglobin: 8.1 g/dL — ABNORMAL LOW (ref 13.0–17.0)
MCH: 29 pg (ref 26.0–34.0)
MCH: 29.2 pg (ref 26.0–34.0)
MCHC: 31.8 g/dL (ref 30.0–36.0)
MCV: 91 fL (ref 78.0–100.0)
RBC: 2.77 MIL/uL — ABNORMAL LOW (ref 4.22–5.81)

## 2012-06-21 LAB — BASIC METABOLIC PANEL
BUN: 19 mg/dL (ref 6–23)
CO2: 28 mEq/L (ref 19–32)
Chloride: 102 mEq/L (ref 96–112)
Glucose, Bld: 127 mg/dL — ABNORMAL HIGH (ref 70–99)
Potassium: 4.6 mEq/L (ref 3.5–5.1)

## 2012-06-21 LAB — PHOSPHORUS: Phosphorus: 3.3 mg/dL (ref 2.3–4.6)

## 2012-06-21 LAB — GLUCOSE, CAPILLARY
Glucose-Capillary: 130 mg/dL — ABNORMAL HIGH (ref 70–99)
Glucose-Capillary: 172 mg/dL — ABNORMAL HIGH (ref 70–99)
Glucose-Capillary: 140 mg/dL — ABNORMAL HIGH (ref 70–99)

## 2012-06-21 NOTE — Progress Notes (Signed)
Subjective: Pt ok. No N/V. Voiding well, no gross hematuria. Passing flatus and BM Mildly sore on right side  Objective: Physical Exam: BP 123/75  Pulse 74  Temp 98.2 F (36.8 C) (Oral)  Resp 20  Ht 6' 0.05" (1.83 m)  Wt 226 lb 8 oz (102.74 kg)  BMI 30.68 kg/m2  SpO2 94% Mildly tender right mid-lower abdomen, no peritoneal signs.   Labs: CBC  Basename 06/21/12 0425 06/20/12 2124  WBC 7.0 7.6  HGB 7.4* 8.7*  HCT 23.3* 26.7*  PLT 207 261   BMET  Basename 06/21/12 0425 06/20/12 0928  NA 137 136  K 4.6 4.3  CL 102 101  CO2 28 28  GLUCOSE 127* 127*  BUN 19 22  CREATININE 1.66* 1.98*  CALCIUM 9.2 9.0   LFT  Basename 06/19/12 2213  PROT 9.1*  ALBUMIN 3.2*  AST 12  ALT 6  ALKPHOS 61  BILITOT 0.3  BILIDIR --  IBILI --  LIPASE 21   PT/INR  Basename 06/20/12 0053  LABPROT 14.5  INR 1.15     Studies/Results: Ct Abdomen Pelvis Wo Contrast  06/19/2012  *RADIOLOGY REPORT*  Clinical Data: Right flank pain radiating around the right side of the abdomen.  Sudden onset right-sided flank pain.  Vomiting.  CT ABDOMEN AND PELVIS WITHOUT CONTRAST  Technique:  Multidetector CT imaging of the abdomen and pelvis was performed following the standard protocol without intravenous contrast.  Comparison: 02/25/2012.  Findings: Dependent atelectasis the lung bases.  Small amount of pericardial fluid or thickening.  Partially visualized left gynecomastia.  Small amount of fluid is present around the liver margin. Calcified gallstone.  Splenomegaly.  Stable periaortic adenopathy.  There is a right subcapsular renal hematoma measuring 4.2 cm in thickness, 9 cm AP and about 9.4 cm cranial-caudal.  There is also retroperitoneal hematoma tracking cranially that is distinct from the right adrenal gland. Retroperitoneal hematoma component measures 5.8 cm transverse, 12.5 cm AP and 12.8 cm cranial-caudal. There is no left retroperitoneal hematoma.  Nonobstructing right renal collecting system  calculi are present. Mass effect on the right kidney from the subcapsular hematoma is moderate.  There is no effacement of central sinus fat.  The left kidney is atrophic.  Abdominal aortic atherosclerosis.  Stomach, small and large bowel grossly appears within normal limits. Minimal free fluid in the anatomic pelvis.  No aggressive osseous lesions.  L5-S1 predominant lumbar spondylosis.  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  IMPRESSION: 1.  Right retroperitoneal hematoma and right renal subcapsular hematoma. Correlating with recent procedural history, this may represent post biopsy hematoma. Maximal thickness of right renal subcapsular hematoma is 42 mm with moderate mass effect on the right kidney. 2.  Unchanged cholelithiasis and nonobstructing right renal calculi. 3.  Splenomegaly. 4.  Unchanged periaortic adenopathy, s some of which may now be reactive. 5.  Left renal atrophy.   Original Report Authenticated By: Andreas Newport, M.D.     Assessment/Plan: Right renal hematoma following random renal bx 9/13 Hgb stable at 7.4 Hemodynamically stable as well and feeling some better. Will continue to follow closely but appears to be stabilizing. Holding off renal angio for now.    LOS: 2 days    Brayton El PA-C 06/21/2012 8:08 AM

## 2012-06-21 NOTE — Progress Notes (Signed)
Internal Medicine Attending  Date: 06/21/2012  Patient name: Patrick [name] Medical record number: [id number] Date of birth: [birth year] Age: 51 y.o. Gender: male  I saw and evaluated the patient. I reviewed the resident's note by Dr. Zada Girt and I agree with the resident's findings and plans as documented in his note.  Conservative management for renal hematoma.  Patient will need ID follow up in the outpatient setting for HIV treatment.   Signed  Criselda Peaches, Juleen Sorrels

## 2012-06-21 NOTE — Progress Notes (Signed)
PA Student Daily Progress Note  [facility] Subjective: Pt reports continued RUQ pain. Rates the pain a 6/10. States his flank pain is better this morning. Overall is feeling better. Denies any new complaints.  Objective:  Filed Vitals:   06/20/12 1400 06/20/12 1741 06/20/12 2146 06/21/12 0617  BP: 162/63 125/75 138/65 123/75  Pulse: 68 67 76 74  Temp: 98.2 F (36.8 C) 98.1 F (36.7 C) 97.7 F (36.5 C) 98.2 F (36.8 C)  TempSrc: Oral Oral Oral Oral  Resp: 20 18 18 20   Height:   6' 0.05" (1.83 m)   Weight:   102.74 kg (226 lb 8 oz)   SpO2: 96% 98% 93% 94%   Physical Exam: Physical Examination:  General appearance - alert, well appearing, overweight white male sitting in bed comfortable. NAD Lymphatics - cervical and supraclavicular adenopathy present Chest - clear to auscultation, respirations unlabored, decreased breath sounds bilaterally Heart - normal rate, regular rhythm, normal S1, S2, no murmurs, rubs, clicks or gallops Abdomen - obese, distended bowel sounds normal, TTP in RUQ, guarding on right side, very mild CVA tenderness on right side Extremities - Extremities normal, atraumatic, no cyanosis or edema bilaterally. 2+ dorsalis pedis pulses bilaterally.  Neuro: CNII-XII grossly intact. Oriented x3  Assessment/Plan: [name] is a 51yo white male, with a PMH of HIV, CKD, DM, HTN, and amemia who was admitted for flank pain and CT findings of right side subcapsular renal hematoma and retroperitoneal hematoma s/p renal biopsy on 9/13.  1. Subcapsular renal hematoma and retroperitoneal hematoma - s/p renal biopsy on 9/13. Some bleeding is expected after biopsy. Pt is able to ambulate, hemoglobin has dropped slightly from admission from 8.6 to 7.4 but is stable.  - will continue to monitor his CBC and CMET and manage conservatively Liver ternder.  Not sure this is all hematoma and need tocheck LFTs. 2. CKD- pts Cr dropped from 1.98 to 1.66 which is around his  baseline   -no intervention at this time will continue to monitor his CBC and CMET   3. HIV- pt has discontinued Tenofovir following kidney injury.   - continue with Infectious disease recommendations   4. DM- pts BGs are improved from his previous hospitalization   - continue diabetic diet, exercise, and regular monitoring   5. Anemia- pt has chronic anemia with a hemoglobin at baseline around 8.0. Is slightly lower today at 7.4 but stable from admission.   - no intervention at this time will continue to monitor his CBC and CMET   6. Obesity- pt states understanding that he needs to follow a strict diet and exercise which will help him loose weight and better control his diabetes.  - continue to encourage pt to follow diet and exercise program   Labs: Basic Metabolic Panel:  Lab 06/21/12 5784 06/20/12 0928 06/19/12 2213  NA 137 136 135  K 4.6 4.3 5.1  CL 102 101 96  CO2 28 28 29   GLUCOSE 127* 127* 164*  BUN 19 22 19   CREATININE 1.66* 1.98* 1.68*  CALCIUM 9.2 9.0 9.7  ALB -- -- --  PHOS -- -- --   Liver Function Tests:  Lab 06/19/12 2213  AST 12  ALT 6  ALKPHOS 61  BILITOT 0.3  PROT 9.1*  ALBUMIN 3.2*    Lab 06/19/12 2213  LIPASE 21  AMYLASE --    Lab 06/21/12 0425 06/20/12 2124 06/20/12 1611 06/20/12 1000 06/20/12 0344 06/19/12 2213  WBC 7.0 7.6 6.2 -- -- --  NEUTROABS -- -- -- -- 5.3 9.3*  HGB 7.4* 8.7* 7.6* -- -- --  HCT 23.3* 26.7* 23.7* -- -- --  MCV 91.4 91.4 90.8 90.4 90.7 --  PLT 207 261 221 -- -- --   Blood Culture    Component Value Date/Time   SDES BLOOD LEFT ARM 06/05/2012 2220   SPECREQUEST BOTTLES DRAWN AEROBIC AND ANAEROBIC 10CC EACH 06/05/2012 2220   CULT NO GROWTH 5 DAYS 06/05/2012 2220   REPTSTATUS 06/12/2012 FINAL 06/05/2012 2220   CBG:  Lab 06/20/12 2310 06/15/12 1157 06/15/12 0730 06/14/12 2056 06/14/12 1721  GLUCAP 119* 154* 167* 161* 197*   Studies/Results: Ct Abdomen Pelvis Wo Contrast  06/19/2012  *RADIOLOGY REPORT*  Clinical Data:  Right flank pain radiating around the right side of the abdomen.  Sudden onset right-sided flank pain.  Vomiting.  CT ABDOMEN AND PELVIS WITHOUT CONTRAST  Technique:  Multidetector CT imaging of the abdomen and pelvis was performed following the standard protocol without intravenous contrast.  Comparison: 02/25/2012.  Findings: Dependent atelectasis the lung bases.  Small amount of pericardial fluid or thickening.  Partially visualized left gynecomastia.  Small amount of fluid is present around the liver margin. Calcified gallstone.  Splenomegaly.  Stable periaortic adenopathy.  There is a right subcapsular renal hematoma measuring 4.2 cm in thickness, 9 cm AP and about 9.4 cm cranial-caudal.  There is also retroperitoneal hematoma tracking cranially that is distinct from the right adrenal gland. Retroperitoneal hematoma component measures 5.8 cm transverse, 12.5 cm AP and 12.8 cm cranial-caudal. There is no left retroperitoneal hematoma.  Nonobstructing right renal collecting system calculi are present. Mass effect on the right kidney from the subcapsular hematoma is moderate.  There is no effacement of central sinus fat.  The left kidney is atrophic.  Abdominal aortic atherosclerosis.  Stomach, small and large bowel grossly appears within normal limits. Minimal free fluid in the anatomic pelvis.  No aggressive osseous lesions.  L5-S1 predominant lumbar spondylosis.  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  IMPRESSION: 1.  Right retroperitoneal hematoma and right renal subcapsular hematoma. Correlating with recent procedural history, this may represent post biopsy hematoma. Maximal thickness of right renal subcapsular hematoma is 42 mm with moderate mass effect on the right kidney. 2.  Unchanged cholelithiasis and nonobstructing right renal calculi. 3.  Splenomegaly. 4.  Unchanged periaortic adenopathy, s some of which may now be reactive. 5.  Left renal atrophy.   Original Report Authenticated By:  Andreas Newport, M.D.    Medications: Scheduled Meds:   . insulin aspart  0-5 Units Subcutaneous QHS  . insulin aspart  0-9 Units Subcutaneous TID WC  . sodium chloride  3 mL Intravenous Q12H   Continuous Infusions:   . sodium chloride 1,000 mL (06/20/12 1249)   PRN Meds:.acetaminophen, acetaminophen, morphine injection, ondansetron (ZOFRAN) IV, ondansetron   This is a Psychologist, occupational Note.  The care of the patient was discussed with Dr. Darrick Penna and the assessment and plan formulated with their assistance.  Please see their attached note for official documentation of the daily encounter.  Aniceto Boss, MA, PA-S2 06/21/2012, 8:12 AM  I have seen and examined this patient and agree with the plan of care seen and eval.  Need to consider other sources of pain.  Clearly has hematoma but all findings not typical of it as only cause of dyscomfort. .  Luma Clopper L 06/21/2012, 11:19 AM

## 2012-06-21 NOTE — Progress Notes (Signed)
Subjective:    Interval Events:   His right abdominal pain is better today to a scale of 5/10.  No new complaints today. No vomiting or nausea.    Objective:    Vital Signs:   Temp:  [97.7 F (36.5 C)-98.2 F (36.8 C)] 98 F (36.7 C) (09/24 1400) Pulse Rate:  [67-88] 88  (09/24 1400) Resp:  [18-20] 20  (09/24 1400) BP: (123-158)/(65-87) 132/73 mmHg (09/24 1400) SpO2:  [93 %-98 %] 95 % (09/24 1400) Weight:  [226 lb 8 oz (102.74 kg)] 226 lb 8 oz (102.74 kg) (09/23 2146) Last BM Date: 06/20/12   Weights: 24-hour Weight change: -1 lb (-0.454 kg)  Filed Weights   06/20/12 0230 06/20/12 2146  Weight: 227 lb 8 oz (103.193 kg) 226 lb 8 oz (102.74 kg)     Intake/Output:   Intake/Output Summary (Last 24 hours) at 06/21/12 1458 Last data filed at 06/21/12 1300  Gross per 24 hour  Intake    720 ml  Output    175 ml  Net    545 ml       Physical Exam: General appearance: alert, cooperative and no distress Head: Normocephalic, without obvious abnormality, atraumatic Back: symmetric, no curvature. ROM normal. No CVA tenderness., Has right CVA tenderness Resp: clear to auscultation bilaterally Cardio: regular rate and rhythm, S1, S2 normal, no murmur, click, rub or gallop GI: normal findings: tenderness in the right lumber region  Extremities: extremities normal, atraumatic, no cyanosis or edema Pulses: 2+ and symmetric Neurologic: Alert and oriented X 3, normal strength and tone. Normal symmetric reflexes. Normal coordination and gait    Labs: Basic Metabolic Panel:  Lab 06/21/12 9604 06/21/12 0425 06/20/12 0928 06/19/12 2213 06/15/12 0620  NA 135 137 136 135 136  K 4.4 4.6 4.3 5.1 4.2  CL 101 102 101 96 103  CO2 28 28 28 29 28   GLUCOSE 148* 127* 127* 164* 209*  BUN 17 19 22 19 14   CREATININE 1.58* 1.66* 1.98* 1.68* 1.23  CALCIUM 9.2 9.2 9.0 -- --  MG -- -- -- -- --  PHOS 3.3 -- -- -- --    Liver Function Tests:  Lab 06/21/12 1233 06/19/12 2213  AST 8  12  ALT <5 6  ALKPHOS 63 61  BILITOT 0.3 0.3  PROT 8.1 9.1*  ALBUMIN 2.9* 3.2*    Lab 06/19/12 2213  LIPASE 21  AMYLASE --   No results found for this basename: AMMONIA:3 in the last 168 hours  CBC:  Lab 06/21/12 0425 06/20/12 2124 06/20/12 1611 06/20/12 1000 06/20/12 0344 06/19/12 2213  WBC 7.0 7.6 6.2 6.3 9.0 --  NEUTROABS -- -- -- -- 5.3 9.3*  HGB 7.4* 8.7* 7.6* 7.4* 7.5* --  HCT 23.3* 26.7* 23.7* 23.6* 23.5* --  MCV 91.4 91.4 90.8 90.4 90.7 --  PLT 207 261 221 214 240 --    Cardiac Enzymes: No results found for this basename: CKTOTAL:5,CKMB:5,CKMBINDEX:5,TROPONINI:5 in the last 168 hours  BNP: No components found with this basename: POCBNP:5  CBG:  Lab 06/21/12 1213 06/21/12 0846 06/20/12 2310 06/15/12 1157 06/15/12 0730  GLUCAP 133* 130* 119* 154* 167*    Coagulation Studies:  Basename 06/20/12 0053  LABPROT 14.5  INR 1.15    Microbiology: Results for orders placed during the hospital encounter of 06/05/12  CULTURE, BLOOD (ROUTINE X 2)     Status: Normal   Collection Time   06/05/12 10:10 PM      Component Value Range Status  Comment   Specimen Description BLOOD RIGHT ARM   Final    Special Requests BOTTLES DRAWN AEROBIC AND ANAEROBIC 10CC EACH   Final    Culture  Setup Time 06/06/2012 08:11   Final    Culture NO GROWTH 5 DAYS   Final    Report Status 06/12/2012 FINAL   Final   CULTURE, BLOOD (ROUTINE X 2)     Status: Normal   Collection Time   06/05/12 10:20 PM      Component Value Range Status Comment   Specimen Description BLOOD LEFT ARM   Final    Special Requests BOTTLES DRAWN AEROBIC AND ANAEROBIC 10CC EACH   Final    Culture  Setup Time 06/06/2012 08:12   Final    Culture NO GROWTH 5 DAYS   Final    Report Status 06/12/2012 FINAL   Final   MRSA PCR SCREENING     Status: Normal   Collection Time   06/06/12  1:05 AM      Component Value Range Status Comment   MRSA by PCR NEGATIVE  NEGATIVE Final     Imaging: Ct Abdomen Pelvis Wo  Contrast  06/19/2012  *RADIOLOGY REPORT*  Clinical Data: Right flank pain radiating around the right side of the abdomen.  Sudden onset right-sided flank pain.  Vomiting.  CT ABDOMEN AND PELVIS WITHOUT CONTRAST  Technique:  Multidetector CT imaging of the abdomen and pelvis was performed following the standard protocol without intravenous contrast.  Comparison: 02/25/2012.  Findings: Dependent atelectasis the lung bases.  Small amount of pericardial fluid or thickening.  Partially visualized left gynecomastia.  Small amount of fluid is present around the liver margin. Calcified gallstone.  Splenomegaly.  Stable periaortic adenopathy.  There is a right subcapsular renal hematoma measuring 4.2 cm in thickness, 9 cm AP and about 9.4 cm cranial-caudal.  There is also retroperitoneal hematoma tracking cranially that is distinct from the right adrenal gland. Retroperitoneal hematoma component measures 5.8 cm transverse, 12.5 cm AP and 12.8 cm cranial-caudal. There is no left retroperitoneal hematoma.  Nonobstructing right renal collecting system calculi are present. Mass effect on the right kidney from the subcapsular hematoma is moderate.  There is no effacement of central sinus fat.  The left kidney is atrophic.  Abdominal aortic atherosclerosis.  Stomach, small and large bowel grossly appears within normal limits. Minimal free fluid in the anatomic pelvis.  No aggressive osseous lesions.  L5-S1 predominant lumbar spondylosis.  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  IMPRESSION: 1.  Right retroperitoneal hematoma and right renal subcapsular hematoma. Correlating with recent procedural history, this may represent post biopsy hematoma. Maximal thickness of right renal subcapsular hematoma is 42 mm with moderate mass effect on the right kidney. 2.  Unchanged cholelithiasis and nonobstructing right renal calculi. 3.  Splenomegaly. 4.  Unchanged periaortic adenopathy, s some of which may now be reactive. 5.   Left renal atrophy.   Original Report Authenticated By: Andreas Newport, M.D.       Medications:    Infusions:    . sodium chloride 1,000 mL (06/20/12 1249)     Scheduled Medications:    . insulin aspart  0-5 Units Subcutaneous QHS  . insulin aspart  0-9 Units Subcutaneous TID WC  . sodium chloride  3 mL Intravenous Q12H     PRN Medications: acetaminophen, acetaminophen, morphine injection, ondansetron (ZOFRAN) IV, ondansetron   Assessment/ Plan:   [name] is a 51 year old gentleman with a history of HIV, CKD, multiple  admissions for diffuse lymphadenopathy and pancytopenia, as well as recent renal biopsy on 06/10/2012, who presents with sudden onset right-sided back pain and CT findings concerning for renal subcapsular hematoma.  Right Kidney Hematoma 2/2 renal bx on the 9/13: CT abdomen and pelvis shows 4.2 x 9 x 9.2 cm subcapsular right renal hematoma and 12.8 x 12.5x 5.8 centimeter right retroperitoneal hematoma with moderate mass effect. The pain is better. No new complaints Plan - Hgb is stable at 7.4 - F/u CBC 12hrlly - Booked two units of PRBC in case he needs transfusion (hb <7 of symptomatic anemia) - Renal and IR recommending conservative management at this time - Will continue with IV fluids as needed.  AKI: Creatine increased to 1.68 from 1.28 at discharge last week. Possible cause include hematomacompressing the kidney. Renal bx showed ATN from Tenofovir toxicity. U/A cloudy and proteinuria.  -Will monitor cr levels - Nephrology following up. -Strict I/Os  -Withhold tenofovir -discontinue dapsone as this might also be contributing to his renal insuffiency -cr is better 1.66 from 1.98  HIV: HIV- Recent CD4 count 183< 370< 210<110.  Followed by Dr. Orvan Falconer. Compliant with medications. 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   DM: On insulin sliding scale. HbA1C 7.1 in 9/9  DVT: SCDS  Length of Stay:  2 days   Signed by:  Dow Adolph PGY-I, Internal Medicine Pager [phone] 06/21/2012, 2:58 PM

## 2012-06-22 DIAGNOSIS — T375X5A Adverse effect of antiviral drugs, initial encounter: Secondary | ICD-10-CM

## 2012-06-22 DIAGNOSIS — Y849 Medical procedure, unspecified as the cause of abnormal reaction of the patient, or of later complication, without mention of misadventure at the time of the procedure: Secondary | ICD-10-CM

## 2012-06-22 DIAGNOSIS — IMO0002 Reserved for concepts with insufficient information to code with codable children: Principal | ICD-10-CM

## 2012-06-22 LAB — CBC
HCT: 23.5 % — ABNORMAL LOW (ref 39.0–52.0)
Hemoglobin: 7.7 g/dL — ABNORMAL LOW (ref 13.0–17.0)
Hemoglobin: 7.4 g/dL — ABNORMAL LOW (ref 13.0–17.0)
MCHC: 31.5 g/dL (ref 30.0–36.0)
Platelets: 217 10*3/uL (ref 150–400)
RBC: 2.66 MIL/uL — ABNORMAL LOW (ref 4.22–5.81)
RBC: 2.59 MIL/uL — ABNORMAL LOW (ref 4.22–5.81)
WBC: 7.2 10*3/uL (ref 4.0–10.5)
WBC: 6 10*3/uL (ref 4.0–10.5)

## 2012-06-22 LAB — GLUCOSE, CAPILLARY
Glucose-Capillary: 136 mg/dL — ABNORMAL HIGH (ref 70–99)
Glucose-Capillary: 112 mg/dL — ABNORMAL HIGH (ref 70–99)

## 2012-06-22 LAB — BASIC METABOLIC PANEL
CO2: 27 mEq/L (ref 19–32)
Chloride: 102 mEq/L (ref 96–112)
Glucose, Bld: 139 mg/dL — ABNORMAL HIGH (ref 70–99)
Potassium: 4.6 mEq/L (ref 3.5–5.1)
Sodium: 137 mEq/L (ref 135–145)

## 2012-06-22 NOTE — Consult Note (Signed)
INFECTIOUS DISEASE CONSULT NOTE  Date of Admission:  06/19/2012  Date of Consult:  06/22/2012  Reason for Consult: HIV+,  Referring Physician: Dr Casilda Carls  Impression/Recommendation Hematomas post Bx Tenofovir toxicity Poor dentition  Would- continue to hold ART Watch his hematomas conservatively, as you are, with serial h/h, exams.  Hold starting him on PCP prophylaxis til he has f/u. His hx of renal dysfunction make me somewhat hesitant to start this bactrim (as does his hx of rash) and he has also had difficulty with dapsone reactions.  F/u in ID clinic for construction of new ART regimen (non-tenofovir containing) He has dental eval at [facility] next month Advised him to defer his colonoscopy another month, til he is more comfortable.    Comment- his HLA B 5701 was negative meaning he could go onto abacavir containing regimen. Again, I would defer this to his primary HIV MD , Dr Orvan Falconer.   Patrick [name] [phone]  Patrick [name] is an 51 y.o. male.  HPI: 51 yo M with HIV+ since 2010, last CD4 180 and VL <20 (06-06-12) and CRI (1.4-1.6) comes to hospital on 9-8 with fever and lymphadenopathy since February 2013. He has 5 admissions for these episodes since 2012 (including 4 this year). These are felt to be due to his ART (he is currently taking atripla, has prev been on other ART- TRV/NRV/X?). He has previously had LN Bx and Bone Marrow Bx all negative.  He underwent renal Bx 06-10-12 (1. DIFFUSE MILD TO MODERATE ACUTE TUBULAR INJURY AND FOCAL MILD INTERSTITIAL NEPHRITIS.  2. FOCAL SEVERE STENOSING ARTERIOSCLEROSIS). He was d/c home on 9-18 off ART He returns 9-23 after the sudden onset of abdominal pain. He also developed n/v with this pain. He was evaluated in ED and found on CT to have a retroperitoneal hematoma and subscapular renal hematoma with mass effect on his R kidney. He had a WBC 12.3, was afebrile and his h/h was 9/28.    Past Medical History  Diagnosis Date  .  Hypertension   . HIV (human immunodeficiency virus infection) 2010  . Hypogonadism male   . CKD (chronic kidney disease) stage 3, GFR 30-59 ml/min     hydrated cr 1.5 gfr 48  . Kidney stones     "quite a few times"  . High cholesterol   . Heart murmur   . Exertional dyspnea 02/25/12    "and lying down"  . Anemia   . Blood transfusion   . Hernia     "surgical; where appendix ruptured"  . Pancytopenia with fever 02/26/2012  . LYMPHADENOPATHY, DIFFUSE 11/18/2010    Qualifier: Diagnosis of  By: Sundra Aland NP, Malvin Johns       Past Surgical History  Procedure Date  . Lithotripsy 2010  . Appendectomy 2001    via midline incision  . Axillary lymph node dissection 2011; 2012    left     Allergies  Allergen Reactions  . Codeine Itching, Rash and Other (See Comments)    "crazy dreams"  . Sulfonamide Derivatives Rash    Medications:  Scheduled:   . insulin aspart  0-5 Units Subcutaneous QHS  . insulin aspart  0-9 Units Subcutaneous TID WC  . sodium chloride  3 mL Intravenous Q12H    Total days of antibiotics 0          Social History:  reports that he has never smoked. He has never used smokeless tobacco. He reports that he drinks alcohol. He reports that he does not  use illicit drugs.  Family History  Problem Relation Age of Onset  . Adopted: Yes    General ROS: no further n/v, eating normally, continued abd/flank pain, normal BM. See HPI  Blood pressure 157/76, pulse 81, temperature 98.5 F (36.9 C), temperature source Oral, resp. rate 18, height 6' 0.05" (1.83 m), weight 103.919 kg (229 lb 1.6 oz), SpO2 94.00%. General appearance: alert, cooperative and no distress Eyes: negative findings: pupils equal, round, reactive to light and accomodation Throat: normal findings: oropharynx pink & moist without lesions or evidence of thrush and abnormal findings: poor dentition Neck: no adenopathy and supple, symmetrical, trachea midline Lungs: clear to auscultation  bilaterally Heart: regular rate and rhythm Abdomen: normal findings: bowel sounds normal and soft, non-tender and fullness in his R flank Extremities: edema none   Results for orders placed during the hospital encounter of 06/19/12 (from the past 48 hour(s))  CBC     Status: Abnormal   Collection Time   06/20/12  9:24 PM      Component Value Range Comment   WBC 7.6  4.0 - 10.5 K/uL    RBC 2.92 (*) 4.22 - 5.81 MIL/uL    Hemoglobin 8.7 (*) 13.0 - 17.0 g/dL    HCT 16.1 (*) 09.6 - 52.0 %    MCV 91.4  78.0 - 100.0 fL    MCH 29.8  26.0 - 34.0 pg    MCHC 32.6  30.0 - 36.0 g/dL    RDW 04.5 (*) 40.9 - 15.5 %    Platelets 261  150 - 400 K/uL   GLUCOSE, CAPILLARY     Status: Abnormal   Collection Time   06/20/12 11:10 PM      Component Value Range Comment   Glucose-Capillary 119 (*) 70 - 99 mg/dL    Comment 1 Documented in Chart      Comment 2 Notify RN     BASIC METABOLIC PANEL     Status: Abnormal   Collection Time   06/21/12  4:25 AM      Component Value Range Comment   Sodium 137  135 - 145 mEq/L    Potassium 4.6  3.5 - 5.1 mEq/L    Chloride 102  96 - 112 mEq/L    CO2 28  19 - 32 mEq/L    Glucose, Bld 127 (*) 70 - 99 mg/dL    BUN 19  6 - 23 mg/dL    Creatinine, Ser 8.11 (*) 0.50 - 1.35 mg/dL    Calcium 9.2  8.4 - 91.4 mg/dL    GFR calc non Af Amer 46 (*) >90 mL/min    GFR calc Af Amer 54 (*) >90 mL/min   CBC     Status: Abnormal   Collection Time   06/21/12  4:25 AM      Component Value Range Comment   WBC 7.0  4.0 - 10.5 K/uL    RBC 2.55 (*) 4.22 - 5.81 MIL/uL    Hemoglobin 7.4 (*) 13.0 - 17.0 g/dL    HCT 78.2 (*) 95.6 - 52.0 %    MCV 91.4  78.0 - 100.0 fL    MCH 29.0  26.0 - 34.0 pg    MCHC 31.8  30.0 - 36.0 g/dL    RDW 21.3 (*) 08.6 - 15.5 %    Platelets 207  150 - 400 K/uL   GLUCOSE, CAPILLARY     Status: Abnormal   Collection Time   06/21/12  8:46 AM  Component Value Range Comment   Glucose-Capillary 130 (*) 70 - 99 mg/dL   GLUCOSE, CAPILLARY     Status:  Abnormal   Collection Time   06/21/12 12:13 PM      Component Value Range Comment   Glucose-Capillary 133 (*) 70 - 99 mg/dL   PHOSPHORUS     Status: Normal   Collection Time   06/21/12 12:33 PM      Component Value Range Comment   Phosphorus 3.3  2.3 - 4.6 mg/dL   COMPREHENSIVE METABOLIC PANEL     Status: Abnormal   Collection Time   06/21/12 12:33 PM      Component Value Range Comment   Sodium 135  135 - 145 mEq/L    Potassium 4.4  3.5 - 5.1 mEq/L    Chloride 101  96 - 112 mEq/L    CO2 28  19 - 32 mEq/L    Glucose, Bld 148 (*) 70 - 99 mg/dL    BUN 17  6 - 23 mg/dL    Creatinine, Ser 1.61 (*) 0.50 - 1.35 mg/dL    Calcium 9.2  8.4 - 09.6 mg/dL    Total Protein 8.1  6.0 - 8.3 g/dL    Albumin 2.9 (*) 3.5 - 5.2 g/dL    AST 8  0 - 37 U/L    ALT <5  0 - 53 U/L    Alkaline Phosphatase 63  39 - 117 U/L    Total Bilirubin 0.3  0.3 - 1.2 mg/dL    GFR calc non Af Amer 49 (*) >90 mL/min    GFR calc Af Amer 57 (*) >90 mL/min   CBC     Status: Abnormal   Collection Time   06/21/12  4:48 PM      Component Value Range Comment   WBC 7.2  4.0 - 10.5 K/uL    RBC 2.77 (*) 4.22 - 5.81 MIL/uL    Hemoglobin 8.1 (*) 13.0 - 17.0 g/dL    HCT 04.5 (*) 40.9 - 52.0 %    MCV 91.0  78.0 - 100.0 fL    MCH 29.2  26.0 - 34.0 pg    MCHC 32.1  30.0 - 36.0 g/dL    RDW 81.1  91.4 - 78.2 %    Platelets 235  150 - 400 K/uL   GLUCOSE, CAPILLARY     Status: Abnormal   Collection Time   06/21/12  4:48 PM      Component Value Range Comment   Glucose-Capillary 172 (*) 70 - 99 mg/dL   GLUCOSE, CAPILLARY     Status: Abnormal   Collection Time   06/21/12 10:12 PM      Component Value Range Comment   Glucose-Capillary 140 (*) 70 - 99 mg/dL   CBC     Status: Abnormal   Collection Time   06/22/12  2:41 AM      Component Value Range Comment   WBC 7.2  4.0 - 10.5 K/uL    RBC 2.66 (*) 4.22 - 5.81 MIL/uL    Hemoglobin 7.7 (*) 13.0 - 17.0 g/dL    HCT 95.6 (*) 21.3 - 52.0 %    MCV 91.0  78.0 - 100.0 fL    MCH 28.9   26.0 - 34.0 pg    MCHC 31.8  30.0 - 36.0 g/dL    RDW 08.6 (*) 57.8 - 15.5 %    Platelets 217  150 - 400 K/uL   BASIC METABOLIC PANEL  Status: Abnormal   Collection Time   06/22/12  2:41 AM      Component Value Range Comment   Sodium 137  135 - 145 mEq/L    Potassium 4.6  3.5 - 5.1 mEq/L    Chloride 102  96 - 112 mEq/L    CO2 27  19 - 32 mEq/L    Glucose, Bld 139 (*) 70 - 99 mg/dL    BUN 17  6 - 23 mg/dL    Creatinine, Ser 4.09 (*) 0.50 - 1.35 mg/dL    Calcium 9.4  8.4 - 81.1 mg/dL    GFR calc non Af Amer 52 (*) >90 mL/min    GFR calc Af Amer 60 (*) >90 mL/min   GLUCOSE, CAPILLARY     Status: Abnormal   Collection Time   06/22/12  7:32 AM      Component Value Range Comment   Glucose-Capillary 136 (*) 70 - 99 mg/dL    Comment 1 Documented in Chart      Comment 2 Notify RN     CBC     Status: Abnormal   Collection Time   06/22/12  8:36 AM      Component Value Range Comment   WBC 6.0  4.0 - 10.5 K/uL    RBC 2.59 (*) 4.22 - 5.81 MIL/uL    Hemoglobin 7.4 (*) 13.0 - 17.0 g/dL    HCT 91.4 (*) 78.2 - 52.0 %    MCV 90.7  78.0 - 100.0 fL    MCH 28.6  26.0 - 34.0 pg    MCHC 31.5  30.0 - 36.0 g/dL    RDW 95.6  21.3 - 08.6 %    Platelets 200  150 - 400 K/uL   GLUCOSE, CAPILLARY     Status: Abnormal   Collection Time   06/22/12 11:25 AM      Component Value Range Comment   Glucose-Capillary 112 (*) 70 - 99 mg/dL    Comment 1 Documented in Chart      Comment 2 Notify RN     GLUCOSE, CAPILLARY     Status: Abnormal   Collection Time   06/22/12  4:47 PM      Component Value Range Comment   Glucose-Capillary 142 (*) 70 - 99 mg/dL    Comment 1 Documented in Chart      Comment 2 Notify RN         Component Value Date/Time   SDES BLOOD LEFT ARM 06/05/2012 2220   SPECREQUEST BOTTLES DRAWN AEROBIC AND ANAEROBIC 10CC EACH 06/05/2012 2220   CULT NO GROWTH 5 DAYS 06/05/2012 2220   REPTSTATUS 06/12/2012 FINAL 06/05/2012 2220   No results found. No results found for this or any previous visit  (from the past 240 hour(s)).    06/22/2012, 6:27 PM     LOS: 3 days

## 2012-06-22 NOTE — Progress Notes (Signed)
Internal Medicine Attending  Date: 06/22/2012  Patient name: Patrick [name] Medical record number: [id number] Date of birth: 21-Aug-1961 Age: 51 y.o. Gender: male  I saw and evaluated the patient. I reviewed the resident's note by Dr. Zada Girt and I agree with the resident's findings and plans as documented in his note with the following correction:  [name] has not been taking dapsone because there is concern for a hypersensitivity reaction.  This would not likely be contributing to his renal dysfunction.   Signed  Criselda Peaches, Tashawn Greff

## 2012-06-22 NOTE — Progress Notes (Signed)
  Subjective: Renal Bx 9/13 Returned to hospital with retroperitoneal bleed per CT Pt has only slight pain in flank now afeb    Objective: Vital signs in last 24 hours: Temp:  [98 F (36.7 C)-99.1 F (37.3 C)] 98.5 F (36.9 C) (09/25 1000) Pulse Rate:  [68-90] 76  (09/25 1000) Resp:  [18-20] 18  (09/25 1000) BP: (132-179)/(73-94) 134/78 mmHg (09/25 1000) SpO2:  [94 %-96 %] 94 % (09/25 1000) Weight:  [229 lb 1.6 oz (103.919 kg)] 229 lb 1.6 oz (103.919 kg) (09/24 2221) Last BM Date: 06/21/12  Intake/Output from previous day: 09/24 0701 - 09/25 0700 In: 6143.3 [P.O.:720; I.V.:5423.3] Out: 176 [Urine:176] Intake/Output this shift: Total I/O In: 240 [P.O.:240] Out: -   PE:  Afeb; VSS H/H: 7.7/ 24.2; 7.4/ 23.5 No distress Eating well; no N/V NT to palpate abd; flank rt   Lab Results:   [facility] 06/22/12 0836 06/22/12 0241  WBC 6.0 7.2  HGB 7.4* 7.7*  HCT 23.5* 24.2*  PLT 200 217   BMET  Basename 06/22/12 0241 06/21/12 1233  NA 137 135  K 4.6 4.4  CL 102 101  CO2 27 28  GLUCOSE 139* 148*  BUN 17 17  CREATININE 1.51* 1.58*  CALCIUM 9.4 9.2   PT/INR  Basename 06/20/12 0053  LABPROT 14.5  INR 1.15   ABG No results found for this basename: PHART:2,PCO2:2,PO2:2,HCO3:2 in the last 72 hours  Studies/Results: No results found.  Anti-infectives: Anti-infectives     Start     Dose/Rate Route Frequency Ordered Stop   06/20/12 1000   dapsone tablet 100 mg  Status:  Discontinued        100 mg Oral Daily 06/20/12 0219 06/20/12 0736          Assessment/Plan: s/p Rt renal bx 9/13 Post retro bleed Stable Plan per Renal MD  Ashante Snelling A 06/22/2012

## 2012-06-22 NOTE — Progress Notes (Signed)
Subjective:    Interval Events:   His right abdominal pain is better today to a scale of 4/10. He is asking whether he can the head and have his colonoscopy that is scheduled in 2 weeks. No new complaints today. No vomiting or nausea.    Objective:    Vital Signs:   Temp:  [98.5 F (36.9 C)-99.1 F (37.3 C)] 99.1 F (37.3 C) (09/25 1800) Pulse Rate:  [68-90] 78  (09/25 1800) Resp:  [18-19] 18  (09/25 1800) BP: (134-179)/(76-94) 143/94 mmHg (09/25 1800) SpO2:  [94 %-96 %] 94 % (09/25 1800) Weight:  [229 lb 1.6 oz (103.919 kg)] 229 lb 1.6 oz (103.919 kg) (09/24 2221) Last BM Date: 06/22/12   Weights: 24-hour Weight change: 2 lb 9.6 oz (1.179 kg)  Filed Weights   06/20/12 0230 06/20/12 2146 06/21/12 2221  Weight: 227 lb 8 oz (103.193 kg) 226 lb 8 oz (102.74 kg) 229 lb 1.6 oz (103.919 kg)     Intake/Output:   Intake/Output Summary (Last 24 hours) at 06/22/12 1954 Last data filed at 06/22/12 1700  Gross per 24 hour  Intake 6753.33 ml  Output      0 ml  Net 6753.33 ml       Physical Exam: General appearance: alert, cooperative and no distress Head: Normocephalic, without obvious abnormality, atraumatic Back: symmetric, no curvature. ROM normal. No CVA tenderness., Has right CVA tenderness Resp: clear to auscultation bilaterally Cardio: regular rate and rhythm, S1, S2 normal, no murmur, click, rub or gallop GI: normal findings: tenderness in the right lumber region  Extremities: extremities normal, atraumatic, no cyanosis or edema Pulses: 2+ and symmetric Neurologic: Alert and oriented X 3, normal strength and tone. Normal symmetric reflexes. Normal coordination and gait    Labs: Basic Metabolic Panel:  Lab 06/22/12 1610 06/21/12 1233 06/21/12 0425 06/20/12 0928 06/19/12 2213  NA 137 135 137 136 135  K 4.6 4.4 4.6 4.3 5.1  CL 102 101 102 101 96  CO2 27 28 28 28 29   GLUCOSE 139* 148* 127* 127* 164*  BUN 17 17 19 22 19   CREATININE 1.51* 1.58* 1.66* 1.98*  1.68*  CALCIUM 9.4 9.2 9.2 -- --  MG -- -- -- -- --  PHOS -- 3.3 -- -- --    Liver Function Tests:  Lab 06/21/12 1233 06/19/12 2213  AST 8 12  ALT <5 6  ALKPHOS 63 61  BILITOT 0.3 0.3  PROT 8.1 9.1*  ALBUMIN 2.9* 3.2*    Lab 06/19/12 2213  LIPASE 21  AMYLASE --   No results found for this basename: AMMONIA:3 in the last 168 hours  CBC:  Lab 06/22/12 0836 06/22/12 0241 06/21/12 1648 06/21/12 0425 06/20/12 2124 06/20/12 0344 06/19/12 2213  WBC 6.0 7.2 7.2 7.0 7.6 -- --  NEUTROABS -- -- -- -- -- 5.3 9.3*  HGB 7.4* 7.7* 8.1* 7.4* 8.7* -- --  HCT 23.5* 24.2* 25.2* 23.3* 26.7* -- --  MCV 90.7 91.0 91.0 91.4 91.4 -- --  PLT 200 217 235 207 261 -- --    Cardiac Enzymes: No results found for this basename: CKTOTAL:5,CKMB:5,CKMBINDEX:5,TROPONINI:5 in the last 168 hours  BNP: No components found with this basename: POCBNP:5  CBG:  Lab 06/22/12 1647 06/22/12 1125 06/22/12 0732 06/21/12 2212 06/21/12 1648  GLUCAP 142* 112* 136* 140* 172*    Coagulation Studies:  Basename 06/20/12 0053  LABPROT 14.5  INR 1.15    Microbiology: Results for orders placed during the hospital encounter of  06/05/12  CULTURE, BLOOD (ROUTINE X 2)     Status: Normal   Collection Time   06/05/12 10:10 PM      Component Value Range Status Comment   Specimen Description BLOOD RIGHT ARM   Final    Special Requests BOTTLES DRAWN AEROBIC AND ANAEROBIC 10CC EACH   Final    Culture  Setup Time 06/06/2012 08:11   Final    Culture NO GROWTH 5 DAYS   Final    Report Status 06/12/2012 FINAL   Final   CULTURE, BLOOD (ROUTINE X 2)     Status: Normal   Collection Time   06/05/12 10:20 PM      Component Value Range Status Comment   Specimen Description BLOOD LEFT ARM   Final    Special Requests BOTTLES DRAWN AEROBIC AND ANAEROBIC 10CC EACH   Final    Culture  Setup Time 06/06/2012 08:12   Final    Culture NO GROWTH 5 DAYS   Final    Report Status 06/12/2012 FINAL   Final   MRSA PCR SCREENING     Status:  Normal   Collection Time   06/06/12  1:05 AM      Component Value Range Status Comment   MRSA by PCR NEGATIVE  NEGATIVE Final     Imaging: No results found.    Medications:    Infusions:    . sodium chloride 100 mL/hr at 06/22/12 1826     Scheduled Medications:    . insulin aspart  0-5 Units Subcutaneous QHS  . insulin aspart  0-9 Units Subcutaneous TID WC  . sodium chloride  3 mL Intravenous Q12H     PRN Medications: acetaminophen, acetaminophen, morphine injection, ondansetron (ZOFRAN) IV, ondansetron   Assessment/ Plan:   [name] is a 51 year old gentleman with a history of HIV, CKD, multiple admissions for diffuse lymphadenopathy and pancytopenia, as well as recent renal biopsy on 06/10/2012, who presents with sudden onset right-sided back pain and CT findings concerning for renal subcapsular hematoma.  Right Kidney Hematoma 2/2 renal bx on the 9/13: CT abdomen and pelvis shows 4.2 x 9 x 9.2 cm subcapsular right renal hematoma and 12.8 x 12.5x 5.8 centimeter right retroperitoneal hematoma with moderate mass effect. The pain is better. No new complaints Plan - Hgb is stable at 7.4 - F/u CBC less frequently at once daily.  - Booked two units of PRBC in case he needs transfusion (hb <7 of symptomatic anemia) - Renal and IR recommending conservative management at this time - Will continue with IV fluids as needed.  AKI: Creatine increased to 1.68 from 1.28 at discharge last week. Possible cause include hematomacompressing the kidney. Renal bx showed ATN from Tenofovir toxicity. U/A cloudy and proteinuria.  -Will monitor cr levels - Nephrology following up. -Strict I/Os  -Withhold tenofovir -discontinue dapsone as this might also be contributing to his renal insuffiency -cr is better 1.51  HIV: HIV- Recent CD4 count 183< 370< 210<110.  Followed by Dr. Orvan Falconer. Compliant with medications. The allele HLA-B*5701 is associated with Abacavir hypersensitivity  reaction  - ID recommends withholding further HIV treatment, Bactrim, and dapsone. -ID recommends followup with primary PCP for change of HIV treatment regimen   DM: On insulin sliding scale. HbA1C 7.1 in 9/9  DVT: SCDS  Length of Stay: 3 days   Signed by:  Dow Adolph PGY-I, Internal Medicine Pager [phone] 06/22/2012, 7:54 PM

## 2012-06-22 NOTE — Progress Notes (Signed)
PA Student Daily Progress Note [facility] Subjective: Pt states is RUQ pain is improving. Rates the pain a 4/10 today. States his flank pain is almost gone. Denies any new complaints. Would like to know when he can go back to work.   Objective:  Filed Vitals:   06/21/12 1400 06/21/12 1800 06/21/12 2221 06/22/12 0612  BP: 132/73 145/86 179/94 137/82  Pulse: 88 81 90 68  Temp: 98 F (36.7 C) 98 F (36.7 C) 99.1 F (37.3 C) 98.5 F (36.9 C)  TempSrc: Oral Oral Oral Oral  Resp: 20 20 19 18   Height:      Weight:   103.919 kg (229 lb 1.6 oz)   SpO2: 95% 96% 94% 96%   Physical Exam: Physical Examination:  General appearance - alert, well appearing, overweight white male sitting in bed comfortable. NAD  Lymphatics - cervical and supraclavicular adenopathy present  Chest - clear to auscultation, respirations unlabored, decreased breath sounds bilaterally  Heart - normal rate, regular rhythm, normal S1, S2, no murmurs, rubs, clicks or gallops  Abdomen - obese, distended, bowel sounds normal, TTP in RUQ, very, very mild CVA tenderness on right side  Extremities - Extremities normal, atraumatic, no cyanosis or edema bilaterally. 2+ dorsalis pedis pulses bilaterally.  Neuro: CNII-XII grossly intact. Oriented x3  Assessment/Plan: [name] is a 51yo white male, with a PMH of HIV, CKD, DM, HTN, and amemia who was admitted for flank pain and CT findings of right side subcapsular renal hematoma and retroperitoneal hematoma s/p renal biopsy on 9/13.   1. Subcapsular renal hematoma and retroperitoneal hematoma - s/p renal biopsy on 9/13. Some bleeding is expected after biopsy. Pt is able to ambulate, hemoglobin is stable at 7.7. LFTs are WNL (AST 8, ALT <5, direct bili .3)  - will continue to monitor his CBC and CMET and manage conservatively   2. CKD- pts Cr dropped from 1.98 to 1.66 which is around his baseline   -no intervention at this time will continue to monitor his CBC  and CMET   3. HIV- pt has discontinued Tenofovir following kidney injury.   - continue with Infectious disease recommendations   4. DM- pts BGs are improved from his previous hospitalization   - continue diabetic diet, exercise, and regular monitoring   5. Anemia- pt has chronic anemia with a hemoglobin at baseline around 8.0. Is stable at 7.7.   - no intervention at this time will continue to monitor his CBC and CMET   6. Obesity- pt states understanding that he needs to follow a strict diet and exercise which will help him loose weight and better control his diabetes.   - continue to encourage pt to follow diet and exercise program   Labs: Basic Metabolic Panel:  Lab 06/22/12 1610 06/21/12 1233 06/21/12 0425  NA 137 135 137  K 4.6 4.4 4.6  CL 102 101 102  CO2 27 28 28   GLUCOSE 139* 148* 127*  BUN 17 17 19   CREATININE 1.51* 1.58* 1.66*  CALCIUM 9.4 9.2 9.2  ALB -- -- --  PHOS -- 3.3 --   Liver Function Tests:  Lab 06/21/12 1233 06/19/12 2213  AST 8 12  ALT <5 6  ALKPHOS 63 61  BILITOT 0.3 0.3  PROT 8.1 9.1*  ALBUMIN 2.9* 3.2*    Lab 06/19/12 2213  LIPASE 21  AMYLASE --   No results found for this basename: AMMONIA:3 in the last 168 hours INR: @resultsinr3 @ CBC:  Lab 06/22/12  1610 06/21/12 1648 06/21/12 0425 06/20/12 2124 06/20/12 1611 06/20/12 0344 06/19/12 2213  WBC 7.2 7.2 7.0 -- -- -- --  NEUTROABS -- -- -- -- -- 5.3 9.3*  HGB 7.7* 8.1* 7.4* -- -- -- --  HCT 24.2* 25.2* 23.3* -- -- -- --  MCV 91.0 91.0 91.4 91.4 90.8 -- --  PLT 217 235 207 -- -- -- --   Blood Culture    Component Value Date/Time   SDES BLOOD LEFT ARM 06/05/2012 2220   SPECREQUEST BOTTLES DRAWN AEROBIC AND ANAEROBIC 10CC EACH 06/05/2012 2220   CULT NO GROWTH 5 DAYS 06/05/2012 2220   REPTSTATUS 06/12/2012 FINAL 06/05/2012 2220    Cardiac Enzymes: No results found for this basename: CKTOTAL:5,CKMB:5,CKMBINDEX:5,TROPONINI:5 in the last 168 hours CBG:  Lab 06/21/12 2212 06/21/12 1648  06/21/12 1213 06/21/12 0846 06/20/12 2310  GLUCAP 140* 172* 133* 130* 119*   Iron Studies: No results found for this basename: IRON,TIBC,TRANSFERRIN,FERRITIN in the last 72 hours  Micro Results: No results found for this or any previous visit (from the past 240 hour(s)). Studies/Results: No results found. Medications: Scheduled Meds:   . insulin aspart  0-5 Units Subcutaneous QHS  . insulin aspart  0-9 Units Subcutaneous TID WC  . sodium chloride  3 mL Intravenous Q12H   Continuous Infusions:   . sodium chloride 1,000 mL (06/20/12 1249)   PRN Meds:.acetaminophen, acetaminophen, morphine injection, ondansetron (ZOFRAN) IV, ondansetron   This is a Psychologist, occupational Note.  The care of the patient was discussed with Dr. Darrick Penna and the assessment and plan formulated with their assistance.  Please see their attached note for official documentation of the daily encounter.  Aniceto Boss, MA, PA-S29/25/2013, 7:59 AM   I have seen and examined this patient and agree with the plan of care  Seen and eval.  Hb stable and clinically stable for D/C .  Adler Alton L 06/22/2012, 11:10 AM

## 2012-06-23 ENCOUNTER — Ambulatory Visit: Payer: Medicaid Other | Admitting: Internal Medicine

## 2012-06-23 DIAGNOSIS — I10 Essential (primary) hypertension: Secondary | ICD-10-CM

## 2012-06-23 DIAGNOSIS — D62 Acute posthemorrhagic anemia: Secondary | ICD-10-CM

## 2012-06-23 LAB — BASIC METABOLIC PANEL
BUN: 14 mg/dL (ref 6–23)
Calcium: 9.2 mg/dL (ref 8.4–10.5)
Chloride: 100 mEq/L (ref 96–112)
Creatinine, Ser: 1.39 mg/dL — ABNORMAL HIGH (ref 0.50–1.35)
GFR calc Af Amer: 66 mL/min — ABNORMAL LOW (ref >90)

## 2012-06-23 LAB — CBC
HCT: 23.6 % — ABNORMAL LOW (ref 39.0–52.0)
MCHC: 32.2 g/dL (ref 30.0–36.0)
Platelets: 210 10*3/uL (ref 150–400)
RDW: 15.3 % (ref 11.5–15.5)

## 2012-06-23 LAB — GLUCOSE, CAPILLARY: Glucose-Capillary: 147 mg/dL — ABNORMAL HIGH (ref 70–99)

## 2012-06-23 MED ORDER — ACETAMINOPHEN 325 MG PO TABS: 650.0000 mg | ORAL_TABLET | Freq: Four times a day (QID) | ORAL | Status: AC | PRN

## 2012-06-23 NOTE — Progress Notes (Signed)
Subjective:    Interval Events:   His right abdominal pain is better today to a scale of 4/10. He is asking whether he can go ahead and have his colonoscopy which is scheduled in 2 weeks. No new complaints today. No vomiting or nausea.    Objective:    Vital Signs:   Temp:  [97.7 F (36.5 C)-99.1 F (37.3 C)] 97.7 F (36.5 C) (09/26 1000) Pulse Rate:  [64-81] 68  (09/26 1000) Resp:  [18] 18  (09/26 1000) BP: (114-157)/(70-94) 141/78 mmHg (09/26 1000) SpO2:  [92 %-97 %] 96 % (09/26 1000) Last BM Date: 06/23/12   Weights: 24-hour Weight change:   Filed Weights   06/20/12 0230 06/20/12 2146 06/21/12 2221  Weight: 227 lb 8 oz (103.193 kg) 226 lb 8 oz (102.74 kg) 229 lb 1.6 oz (103.919 kg)     Intake/Output:   Intake/Output Summary (Last 24 hours) at 06/23/12 1121 Last data filed at 06/23/12 1051  Gross per 24 hour  Intake   1730 ml  Output      1 ml  Net   1729 ml       Physical Exam: General appearance: alert, cooperative and no distress Head: Normocephalic, without obvious abnormality, atraumatic Back: symmetric, no curvature. ROM normal. No CVA tenderness., Has right CVA tenderness Resp: clear to auscultation bilaterally Cardio: regular rate and rhythm, S1, S2 normal, no murmur, click, rub or gallop GI: normal findings: tenderness in the right lumber region  Extremities: extremities normal, atraumatic, no cyanosis or edema Pulses: 2+ and symmetric Neurologic: Alert and oriented X 3, normal strength and tone. Normal symmetric reflexes. Normal coordination and gait    Labs: Basic Metabolic Panel:  Lab 06/23/12 1610 06/22/12 0241 06/21/12 1233 06/21/12 0425 06/20/12 0928  NA 135 137 135 137 136  K 4.5 4.6 4.4 4.6 4.3  CL 100 102 101 102 101  CO2 27 27 28 28 28   GLUCOSE 141* 139* 148* 127* 127*  BUN 14 17 17 19 22   CREATININE 1.39* 1.51* 1.58* 1.66* 1.98*  CALCIUM 9.2 9.4 9.2 -- --  MG -- -- -- -- --  PHOS -- -- 3.3 -- --    Liver Function  Tests:  Lab 06/21/12 1233 06/19/12 2213  AST 8 12  ALT <5 6  ALKPHOS 63 61  BILITOT 0.3 0.3  PROT 8.1 9.1*  ALBUMIN 2.9* 3.2*    Lab 06/19/12 2213  LIPASE 21  AMYLASE --   No results found for this basename: AMMONIA:3 in the last 168 hours  CBC:  Lab 06/23/12 0620 06/22/12 0836 06/22/12 0241 06/21/12 1648 06/21/12 0425 06/20/12 0344 06/19/12 2213  WBC 6.6 6.0 7.2 7.2 7.0 -- --  NEUTROABS -- -- -- -- -- 5.3 9.3*  HGB 7.6* 7.4* 7.7* 8.1* 7.4* -- --  HCT 23.6* 23.5* 24.2* 25.2* 23.3* -- --  MCV 90.8 90.7 91.0 91.0 91.4 -- --  PLT 210 200 217 235 207 -- --    Cardiac Enzymes: No results found for this basename: CKTOTAL:5,CKMB:5,CKMBINDEX:5,TROPONINI:5 in the last 168 hours  BNP: No components found with this basename: POCBNP:5  CBG:  Lab 06/23/12 0754 06/22/12 2115 06/22/12 1647 06/22/12 1125 06/22/12 0732  GLUCAP 147* 145* 142* 112* 136*    Coagulation Studies: No results found for this basename: LABPROT:5,INR:5 in the last 72 hours  Microbiology: Results for orders placed during the hospital encounter of 06/05/12  CULTURE, BLOOD (ROUTINE X 2)     Status: Normal   Collection Time  06/05/12 10:10 PM      Component Value Range Status Comment   Specimen Description BLOOD RIGHT ARM   Final    Special Requests BOTTLES DRAWN AEROBIC AND ANAEROBIC 10CC EACH   Final    Culture  Setup Time 06/06/2012 08:11   Final    Culture NO GROWTH 5 DAYS   Final    Report Status 06/12/2012 FINAL   Final   CULTURE, BLOOD (ROUTINE X 2)     Status: Normal   Collection Time   06/05/12 10:20 PM      Component Value Range Status Comment   Specimen Description BLOOD LEFT ARM   Final    Special Requests BOTTLES DRAWN AEROBIC AND ANAEROBIC 10CC EACH   Final    Culture  Setup Time 06/06/2012 08:12   Final    Culture NO GROWTH 5 DAYS   Final    Report Status 06/12/2012 FINAL   Final   MRSA PCR SCREENING     Status: Normal   Collection Time   06/06/12  1:05 AM      Component Value Range  Status Comment   MRSA by PCR NEGATIVE  NEGATIVE Final     Imaging: No results found.    Medications:    Infusions:    . sodium chloride 100 mL/hr at 06/22/12 1826     Scheduled Medications:    . insulin aspart  0-5 Units Subcutaneous QHS  . insulin aspart  0-9 Units Subcutaneous TID WC  . sodium chloride  3 mL Intravenous Q12H     PRN Medications: acetaminophen, acetaminophen, morphine injection, ondansetron (ZOFRAN) IV, ondansetron   Assessment/ Plan:   [name] is a 51 year old gentleman with a history of HIV, CKD, multiple admissions for diffuse lymphadenopathy and pancytopenia, as well as recent renal biopsy on 06/10/2012, who presents with sudden onset right-sided back pain and CT findings concerning for renal subcapsular hematoma.  Right Kidney Hematoma 2/2 renal bx on the 9/13: CT abdomen and pelvis shows 4.2 x 9 x 9.2 cm subcapsular right renal hematoma and 12.8 x 12.5x 5.8 centimeter right retroperitoneal hematoma with moderate mass effect. The pain is better. No new complaints Plan - Hgb is stable at 7.6. He will have a CBC as outpatient in five days Anemia related to acute blood loss on chronic anemia: This is related to the renal hematoma. Hb has been stable for the last three days.  AKI on chronic kidney disease: Creatine increased to 1.68 from 1.28 at discharge last week. Possible cause include hematomacompressing the kidney. Renal bx showed ATN from Tenofovir toxicity. U/A cloudy and proteinuria.  -Will monitor cr levels - Nephrology following up. -Withhold tenofovir -discontinue dapsone as this might also be contributing to his renal insuffiency -cr is better 1.39. This will be followed up as outpatient.  HIV: HIV- Recent CD4 count 183< 370< 210<110.  Followed by Dr. Orvan Falconer. Compliant with medications. 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 -ID recommends followup with primary PCP for change of HIV treatment regimen   DM: On insulin sliding scale. HbA1C 7.1 in 9/9  DVT: SCDS  Length of Stay: 4 days   Signed by:  Dow Adolph PGY-I, Internal Medicine Pager [phone] 06/23/2012, 11:21 AM

## 2012-06-23 NOTE — Discharge Summary (Signed)
Internal Medicine Teaching [facility] Discharge Note  Name: Patrick [name] MRN: [id number] DOB: [birth year] 51 y.o.  Date of Admission: 06/19/2012  9:44 PM Date of Discharge: 06/23/2012 Attending Physician: No att. providers found  Discharge Diagnosis: Principal Problem:  *Renal hematoma, right Active Problems:  HIV DISEASE  Acute-on-chronic kidney injury  Acute blood loss anemia  Anemia due to acute blood loss  HYPERTENSION  LYMPHADENOPATHY, DIFFUSE  Gall bladder stones   Discharge Medications:   Medication List     As of 06/23/2012  2:38 PM    TAKE these medications         acetaminophen 325 MG tablet   Commonly known as: TYLENOL   Take 2 tablets (650 mg total) by mouth every 6 (six) hours as needed (or Fever >/= 101).          Disposition and follow-up:   Patrick [name] was discharged from [facility] in stable condition.    At the hospital follow up visit please address:  1. Please do a complete blood count to check hemoglobin level. His hemoglobin level was 7.6 on the day of discharge. Please also check his basic metabolic panel for creatinine level.  2. Please discuss with the patient his future plans of treating diabetes. The patient did not wish to start on any oral hypoglycemics, and opted to start on lifestyle changes.  3. Please encourage patient to followup with his appointment at the ID clinic for treatment of HIV.  Follow-up Appointments: Follow-up Information    Follow up with Janalyn Harder, MD. On 06/28/2012. (9:45am)    Contact information:   [address] [place] [address] [phone]       Call Cliffton Asters, MD. (Please call for an appointment with Dr Orvan Falconer)    Contact information:   [address] [place] [address] [phone]         Discharge Orders    Future Appointments: Provider: Department: Dept Phone: Center:   06/28/2012 9:45 AM Linward Headland, MD [facility]  Res [phone] [facility]     Future Orders Please Complete By Expires   Diet - low sodium heart healthy      Increase activity slowly      Call MD for:  severe uncontrolled pain      Call MD for:  persistant nausea and vomiting      Call MD for:  persistant dizziness or light-headedness      Call MD for:  extreme fatigue      Discharge instructions      Comments:   Please page [phone] after 5pm or over the weekends for any medical questions or concerns.  Please come to the Emergency department if you develop severe abdominal pain Please follow with the appointment on the 06/28/2012 at 9:45am at the internal medicine clinic. Please strain from strenuous physical activity for the next 3 weeks.      Consultations: Treatment Team:  Trevor Iha, MD [nephrology], interventional radiology.  Procedures Performed:  Ct Abdomen Pelvis Wo Contrast  06/19/2012  *RADIOLOGY REPORT*  Clinical Data: Right flank pain radiating around the right side of the abdomen.  Sudden onset right-sided flank pain.  Vomiting.  CT ABDOMEN AND PELVIS WITHOUT CONTRAST  Technique:  Multidetector CT imaging of the abdomen and pelvis was performed following the standard protocol without intravenous contrast.  Comparison: 02/25/2012.  Findings: Dependent atelectasis the lung bases.  Small amount of pericardial fluid  or thickening.  Partially visualized left gynecomastia.  Small amount of fluid is present around the liver margin. Calcified gallstone.  Splenomegaly.  Stable periaortic adenopathy.  There is a right subcapsular renal hematoma measuring 4.2 cm in thickness, 9 cm AP and about 9.4 cm cranial-caudal.  There is also retroperitoneal hematoma tracking cranially that is distinct from the right adrenal gland. Retroperitoneal hematoma component measures 5.8 cm transverse, 12.5 cm AP and 12.8 cm cranial-caudal. There is no left retroperitoneal hematoma.  Nonobstructing right renal collecting system calculi are present. Mass  effect on the right kidney from the subcapsular hematoma is moderate.  There is no effacement of central sinus fat.  The left kidney is atrophic.  Abdominal aortic atherosclerosis.  Stomach, small and large bowel grossly appears within normal limits. Minimal free fluid in the anatomic pelvis.  No aggressive osseous lesions.  L5-S1 predominant lumbar spondylosis.  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  IMPRESSION: 1.  Right retroperitoneal hematoma and right renal subcapsular hematoma. Correlating with recent procedural history, this may represent post biopsy hematoma. Maximal thickness of right renal subcapsular hematoma is 42 mm with moderate mass effect on the right kidney. 2.  Unchanged cholelithiasis and nonobstructing right renal calculi. 3.  Splenomegaly. 4.  Unchanged periaortic adenopathy, s some of which may now be reactive. 5.  Left renal atrophy.   Original Report Authenticated By: Andreas Newport, M.D.    Dg Chest 2 View  06/11/2012  *RADIOLOGY REPORT*  Clinical Data: Shortness of breath.  Possible vasculitis.  HIV. Hypertension.  CHEST - 2 VIEW  Comparison: 06/10/2012  Findings: Cardiac and mediastinal contours appear unremarkable.  Linear retrocardiac opacities most attributable to vasculature. Trace thickening of the left major fissure on the lateral projection.  Posterior pleural thickening bilaterally suggests tiny bilateral pleural effusions, although there is no overt blunting of the costophrenic angles.  No airway thickening noted.  IMPRESSION:  1.  Suspected trace bilateral pleural effusions.   Otherwise, no significant abnormality identified.   Original Report Authenticated By: Dellia Cloud, M.D.    Dg Chest 2 View  06/05/2012  *RADIOLOGY REPORT*  Clinical Data: Fever.  Cough.  Swollen lymph nodes.  Chest pain  CHEST - 2 VIEW  Comparison: 02/22/2012.  Findings: Slightly shallow inspiration. The heart size and pulmonary vascularity are normal. The lungs appear clear and  expanded without focal air space disease or consolidation. No blunting of the costophrenic angles.  No pneumothorax.  Mediastinal contours appear intact.  No significant changes since previous study.  Surgical clips in the left axilla.  IMPRESSION: No evidence of active pulmonary disease.   Original Report Authenticated By: Marlon Pel, M.D.    US Abdomen Complete  06/07/2012  *RADIOLOGY REPORT*  Clinical Data:  Acute renal failure.  History kidney stones.  HIV with lymphadenopathy, splenomegaly, and fevers.  COMPLETE ABDOMINAL ULTRASOUND  Comparison:  Renal ultrasound 02/23/2012.  CT of the abdomen pelvis without contrast 11/22/2010.  Findings:  Gallbladder:  A 2.7 cm shadowing stone is stable.  There is no evidence for cholecystitis.  The gallbladder wall thickness is within normal limits at 2 mm.  Common bile duct:  Normal in caliber. No biliary ductal dilation.The maximal diameter is 3 mm.  Liver:  No focal lesion identified.  Within normal limits in parenchymal echogenicity.  IVC:  Appears normal.  Pancreas:  Although the pancreas is difficult to visualize in its entirety, no focal pancreatic abnormality is identified.  Spleen:  Spleen is markedly enlarged, measuring at  least 15.8 cm. It is difficult to that the entire spleen lung field of view.  Right Kidney:  85 mm nonobstructing calculus at the upper pole of the right kidney is stable.  There is no hydronephrosis.  The right kidney length is 14.4 cm.  Left Kidney:  No hydronephrosis.  Well-preserved cortex.  Normal size and parenchymal echotexture without focal abnormalities. The maximal length is 10.2 cm, stable.  Abdominal aorta:  No aneurysm identified.  IMPRESSION:  1.  Persistent splenomegaly. 2.  2.7 cm shadowing nonobstructing gallstone.  There is no evidence for hydronephrosis. 3.  Stable nonobstructive right upper pole kidney stone.   Original Report Authenticated By: Jamesetta Orleans. MATTERN, M.D.    US Abdomen Limited  06/12/2012   *RADIOLOGY REPORT*  Clinical Data: Abdominal distension.  End-stage renal disease. Question ascites.  LIMITED ABDOMEN ULTRASOUND FOR ASCITES  Technique:  Limited ultrasound survey for ascites was performed in all four abdominal quadrants.  Comparison:  Abdominal ultrasound 06/07/2012.  Findings: Limited evaluation of all four quadrants was performed. No ascites is demonstrated.  IMPRESSION: No evidence of ascites.   Original Report Authenticated By: Gerrianne Scale, M.D.    US Biopsy  06/10/2012  *RADIOLOGY REPORT*  Clinical Data/Indication: Renal failure  ULTRASOUND-GUIDED RANDOM RENAL CORTEX CORE BIOPSY.  Sedation: Versed 2.0 mg, Fentanyl 50 mcg.  Total Moderate Sedation Time: 13 minutes.  Procedure: The procedure, risks, benefits, and alternatives were explained to the patient. Questions regarding the procedure were encouraged and answered. The patient understands and consents to the procedure.  The right back was prepped with betadine in a sterile fashion, and a sterile drape was applied covering the operative field. A mask and sterile gloves were used for the procedure.  Under sonographic guidance, three 16 gauge core biopsies of the cortex of the lower pole of the right kidney were obtained. Final imaging was performed.  Patient tolerated the procedure well without complication.  Vital sign monitoring by nursing staff during the procedure will continue as patient is in the special procedures unit for post procedure observation.  Findings: The images document guide needle placement within the right kidney lower pole cortex. Post biopsy images demonstrate no hemorrhage.  IMPRESSION: Successful ultrasound-guided renal core biopsy.   Original Report Authenticated By: Donavan Burnet, M.D.    Dg Chest Portable 1 View  06/10/2012  *RADIOLOGY REPORT*  Clinical Data: Fever, cough  PORTABLE CHEST - 1 VIEW  Comparison: 06/05/2012  Findings: Lung volumes are low with crowding of the bronchovascular markings.   Heart size upper limits of normal.  Right costophrenic angle omitted in the field of view.  No focal pulmonary opacity. No pleural effusion.  No acute osseous finding.  IMPRESSION: No focal acute finding. If the patient's symptoms continue, consider PA and lateral chest radiographs obtained at full inspiration when the patient is clinically able.   Original Report Authenticated By: Harrel Lemon, M.D.    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  HIV.  CT MAXILLOFACIAL WITHOUT CONTRAST  Technique:  Multidetector CT imaging of the maxillofacial structures was performed. Multiplanar CT image reconstructions were also generated.  Comparison: None.  Findings: Visualized noncontrast brain parenchyma is within normal limits. Calcified atherosclerosis at the skull base.  Widespread upper cervical lymphadenopathy partially visible.  Among the largest visible nodes are of left level be, 20 mm short axis.  Tonsillar and adenoid hypertrophy also noted and may be related to the  lymphoproliferative disorder.  Visualized orbit soft tissues are within normal limits.  Visualized tympanic cavities and mastoids are clear.  Minimal sphenoid sinus mucosal thickening. Mild to moderate ethmoid sinus mucosal thickening. Frontal sinuses are clear. Moderate bilateral maxillary sinus mucosal thickening which is mostly dependent and at least in part related to mucous retention cyst. Mucosal thickening at both [facility] greater on the left.  The nasal septum is intact.  No osseous erosions.  Negative nasal cavity. No acute osseous abnormality identified.  IMPRESSION: 1.  Mild to moderate conventional appearing ethmoid and maxillary sinus mucosal thickening.  No imaging features of Wegener's granulomatosis identified. 2.  Cervical lymphadenopathy partially visible.  Adenoid and tonsillar hypertrophy.  Favor lymphoproliferative disorder related to HIV, but  lymphoma / leukemia not excluded.   Original Report Authenticated By: Harley Hallmark, M.D.     Admission HPI:  Patrick [name] is a 52 year old gentleman with a history of HIV, CKD, multiple admissions for diffuse lymphadenopathy and pancytopenia, as well as recent renal biopsy on 06/10/2012, who presents with sudden onset right-sided back pain. He stated that that evening around 7:30 PM he went out to eat and after eating only a few bites of food, had sudden onset of 10/10 right-sided low back pain. He describes the pain as "excruciating" and "sharp" and it was constant. He did not  give a history of crescendo/decrescendo or episodic pain. He states the pain starts in his back "in the same place" where he felt pain during the renal biopsy, and it radiated to the right upper quadrant. The pain was worse with laying down and worse with movement. He was also experiencing a severe headache at that time. Shortly after the pain began, patient experienced cold sweats, dizziness, vomiting X4. At the time he evaluated in the ED, his pain was much alleviated after pain medications and his headache was gone. He denied any history of trauma. He also denied fever, shortness of breath, chest pain, hematuria.   Hospital Course by problem list:  Right Kidney Hematoma secondary to a renal biopsy on the 06/10/2012: Patrick [name]'s presentation with severe right abdominal pain with a history of recent renal biopsy was concerning of renal hematoma versus biliary colic. CT abdomen and pelvis shows 4.2 x 9 x 9.2 cm subcapsular right renal hematoma and 12.8 x 12.5x 5.8 centimeter right retroperitoneal hematoma with moderate mass effect. He was managed with pain medications, serial abdominal exam, and serial CBCs to monitor hemoglobin levels. Initially, the CBCs were monitored every 6 hours, until he was stable and then monitored 12 hours from that. Type and screening of his blood was performed, and 2 units of packed red blood cells  were booked in case he needed transfusion. Our goal is to keep his hemoglobin level above 7.0 or to transfuse him if he developed symptoms from anemia. Intervention radiology and nephrology were consulted and they both agreed that his right kidney hamartoma be managed conservatively with serial hemoglobin measuring and pain controlled. Fortunately, Patrick [name] is hemoglobin level remained stable between 7.4-8.1 throughout this hospitalization. His abdominal pain, also significantly improved, and he needed less and less pain medications. He was discharged with instructions to return to the clinic in 5 days for Hb measurement. He was also instructed to return to the ED if he experiences sudden or severe onset of abdominal pain. He verbalized understanding of these instructions and will followup in the clinic on 06/28/2012. Anemia due to acute blood loss on chronic anemia: Patrick [name] chronic anemia  has been associated with pancytopenia of undetermined cause. When he was discharged on 06/15/2012 his hemoglobin was 8.2 and it had increased to 9.0. However, during admission, it dropped from 9.0 to 7.5. We monitored his hemoglobin and it is stayed stable at around 7.6. This drop in hemoglobin level was supposed to be associated with the renal hematoma as mentioned above.He was discharged with an Hb of 7.6. He did not need any transfusions during this hospitalization. This will be evaluated during outpatient followup. Acute kidney injury on chronic kidney disease. Patrick [name] creatinine level had increased from 1.28, when he had been discharged a week prior to 1.68. It was felt that this bump in his creatinine level was as a result of the hamartoma compressing the right kidney and some component of dehydration. He was gently rehydrated with intravenous fluids, and serial measurement of the creatinine levels. He had been instructed to stop taking tenofovir and and dapsone after these were thought to be contributing  to his kidney disease from the previous admissions. His creatinine level was continued to improve and at discharge they had a trended down to 1.39. This will be checked as outpatient in 5 days. Gall bladder stones: Patrick [name] has non-obstructive bladder stones which have been present for the last several years. Initially, in the ED, these were thought to be the ones causing his right abdominal pain. However, review of his previous images demonstrated that these gallstones, have been stable and nonobstructive since as far back as 2010. There are no features of obstruction with this admission. Liver function tests, were within normal limits. He has been counseled about gallstones, and the need for elective cholecystectomy if he so desires. He has also been informed that these gallstones, and nonobstructive and/or unlikely to be accounting for his right abdominal pain. HIV infection: Patrick [name] HIV  had been well treated with Atripla. CD4 count had reduced 183< 370< 210<110. This was felt to be associated with pancytopenia during his previous admission. However, given tenofovir toxicity causing  Acute renal injury, Atripla was stopped. He is undergoing evaluation for change to another regimen. He follows closely with Dr. Orvan Falconer who manages his HIV disease and he has evaluated him during his recent admission. The ID clinic will contact him for an appointment.  DM: Patrick [name] was diagnosed to have diabetes, type II, based on an increased HbA1c of 7.1%. However, he is hesitant to start any treatment and he wishes to try lifestyle changes. He has been counseled on this issue on he agrees to a close followup as outpatient for his diabetes. Over his admission he did not need any insulin given normal blood sugars.  Of note, Patrick [name] has other ongoing problems, including a recent history of pancytopenia, episodic fever syndrome and he has been extensively evaluated by hematology/oncology and rheumatology.  His oncologist is encouraged him to seek a second opinion from [facility]. These issues were not actively addressed during this hospitalization, but they had been extensively evaluated during his prior admission.  Discharge Vitals:  BP 141/78  Pulse 68  Temp 97.7 F (36.5 C) (Oral)  Resp 18  Ht 6' 0.05" (1.83 m)  Wt 229 lb 1.6 oz (103.919 kg)  BMI 31.03 kg/m2  SpO2 96%  Discharge Labs:  Results for orders placed during the hospital encounter of 06/19/12 (from the past 24 hour(s))  GLUCOSE, CAPILLARY     Status: Abnormal   Collection Time   06/22/12  4:47 PM      Component  Value Range   Glucose-Capillary 142 (*) 70 - 99 mg/dL   Comment 1 Documented in Chart     Comment 2 Notify RN    GLUCOSE, CAPILLARY     Status: Abnormal   Collection Time   06/22/12  9:15 PM      Component Value Range   Glucose-Capillary 145 (*) 70 - 99 mg/dL   Comment 1 Documented in Chart     Comment 2 Notify RN    BASIC METABOLIC PANEL     Status: Abnormal   Collection Time   06/23/12  6:20 AM      Component Value Range   Sodium 135  135 - 145 mEq/L   Potassium 4.5  3.5 - 5.1 mEq/L   Chloride 100  96 - 112 mEq/L   CO2 27  19 - 32 mEq/L   Glucose, Bld 141 (*) 70 - 99 mg/dL   BUN 14  6 - 23 mg/dL   Creatinine, Ser 1.61 (*) 0.50 - 1.35 mg/dL   Calcium 9.2  8.4 - 09.6 mg/dL   GFR calc non Af Amer 57 (*) >90 mL/min   GFR calc Af Amer 66 (*) >90 mL/min  CBC     Status: Abnormal   Collection Time   06/23/12  6:20 AM      Component Value Range   WBC 6.6  4.0 - 10.5 K/uL   RBC 2.60 (*) 4.22 - 5.81 MIL/uL   Hemoglobin 7.6 (*) 13.0 - 17.0 g/dL   HCT 04.5 (*) 40.9 - 81.1 %   MCV 90.8  78.0 - 100.0 fL   MCH 29.2  26.0 - 34.0 pg   MCHC 32.2  30.0 - 36.0 g/dL   RDW 91.4  78.2 - 95.6 %   Platelets 210  150 - 400 K/uL  GLUCOSE, CAPILLARY     Status: Abnormal   Collection Time   06/23/12  7:54 AM      Component Value Range   Glucose-Capillary 147 (*) 70 - 99 mg/dL  GLUCOSE, CAPILLARY     Status: Abnormal    Collection Time   06/23/12 12:00 PM      Component Value Range   Glucose-Capillary 125 (*) 70 - 99 mg/dL    Signed: Dow Adolph 06/23/2012, 2:38 PM   Time Spent on Discharge: 

## 2012-06-24 ENCOUNTER — Encounter: Payer: Self-pay | Admitting: Internal Medicine

## 2012-06-24 ENCOUNTER — Other Ambulatory Visit: Payer: Self-pay | Admitting: Internal Medicine

## 2012-06-24 ENCOUNTER — Ambulatory Visit (INDEPENDENT_AMBULATORY_CARE_PROVIDER_SITE_OTHER): Payer: Medicaid Other | Admitting: Internal Medicine

## 2012-06-24 ENCOUNTER — Telehealth: Payer: Self-pay | Admitting: *Deleted

## 2012-06-24 VITALS — BP 120/72 | HR 85 | Temp 97.4°F | Ht 72.0 in | Wt 227.3 lb

## 2012-06-24 DIAGNOSIS — Z299 Encounter for prophylactic measures, unspecified: Secondary | ICD-10-CM

## 2012-06-24 DIAGNOSIS — Z23 Encounter for immunization: Secondary | ICD-10-CM

## 2012-06-24 DIAGNOSIS — S37019A Minor contusion of unspecified kidney, initial encounter: Secondary | ICD-10-CM

## 2012-06-24 DIAGNOSIS — S37011A Minor contusion of right kidney, initial encounter: Secondary | ICD-10-CM

## 2012-06-24 LAB — CBC
Hemoglobin: 8.6 g/dL — ABNORMAL LOW (ref 13.0–17.0)
MCH: 28.8 pg (ref 26.0–34.0)
MCHC: 32.1 g/dL (ref 30.0–36.0)
MCV: 89.6 fL (ref 78.0–100.0)
RBC: 2.99 MIL/uL — ABNORMAL LOW (ref 4.22–5.81)
RDW: 15.3 % (ref 11.5–15.5)
WBC: 6 10*3/uL (ref 4.0–10.5)

## 2012-06-24 LAB — TYPE AND SCREEN
ABO/RH(D): O POS
Antibody Screen: NEGATIVE
Unit division: 0

## 2012-06-24 MED ORDER — HYDROCODONE-ACETAMINOPHEN 5-325 MG PO TABS: 1.0000 | ORAL_TABLET | Freq: Four times a day (QID) | ORAL | Status: DC | PRN

## 2012-06-24 NOTE — Telephone Encounter (Signed)
Patient calls and reports that his right abd /flank pain worsening since 6 am when he woke up. He reports that his pain is in the same location, and the characteristic of his pain is similar as well. The only difference is his pain intensity.   He denies any strenuous activity or heavy lifting since discharge. Denies fever, chill or feeling dizziness or lightheadedness.  Of note, patient was just discharged from hospital yesterday after the evaluation of retroperitoneal/ renal hematoma 2/2 renal biopsy. His HH stabilized at 7.4-7.6 for > 48 hours before discharge. Renal and IR recs no intervention.   - Will call the clinic for him to be seen this afternoon.  - will need to evaluate his abd/flank pain and check HH to see if he continues to be bleeding.  -if no active bleeding, will need to give him Vicotin 5/325 PRN for pain control. He has an appt next Tuesday. - he will be seen at 245 pm by Dr. Tonny Branch today.

## 2012-06-24 NOTE — Patient Instructions (Signed)
1.  You hemoglobin was fine today.   - you can use the Hydrocodone to help the pain up to 4 times daily  - You should avoid tylenol with it.  2.  Keep a your appointment on Tuesday to follow up on everything.

## 2012-06-24 NOTE — Telephone Encounter (Signed)
Pt called stating he was d/c from hospital yesterday and is now having uncomfortable, worse than when he was in hospital. He is asking for a light pain medication to last until f/u on 10/1.  Tylenol is not helping. Pt # [id number]  Please advise.

## 2012-06-24 NOTE — Progress Notes (Signed)
Subjective:   Patient ID: Patrick [name] male   DOB: 01-30-1961 51 y.o.   MRN: [id number]  HPI: Patrick [name] is a 51 y.o. man who presents to clinic today for hospital follow up.  He was admitted from 9/22 to 9/26 and found to have a renal hematoma on the right side.  He states that he woke up this morning with some increased back pain and nausea on the right side of the back.  He denies being more fatigued, or any dizziness when standing.  He states that his energy level is good.    He is also in need of a flu shot today and would like to get that done today.   Past Medical History  Diagnosis Date  . Hypertension   . HIV (human immunodeficiency virus infection) 2010  . Hypogonadism male   . CKD (chronic kidney disease) stage 3, GFR 30-59 ml/min     hydrated cr 1.5 gfr 48  . Kidney stones     "quite a few times"  . High cholesterol   . Heart murmur   . Exertional dyspnea 02/25/12    "and lying down"  . Anemia   . Blood transfusion   . Hernia     "surgical; where appendix ruptured"  . Pancytopenia with fever 02/26/2012  . LYMPHADENOPATHY, DIFFUSE 11/18/2010    Qualifier: Diagnosis of  By: Sundra Aland NP, Malvin Johns      Current Outpatient Prescriptions  Medication Sig Dispense Refill  . acetaminophen (TYLENOL) 325 MG tablet Take 2 tablets (650 mg total) by mouth every 6 (six) hours as needed (or Fever >/= 101).  15 tablet  0   No current facility-administered medications for this visit.   Facility-Administered Medications Ordered in Other Visits  Medication Dose Route Frequency Provider Last Rate Last Dose  . DISCONTD: 0.9 %  sodium chloride infusion   Intravenous Continuous Shelda Jakes, MD 100 mL/hr at 06/22/12 1826    . DISCONTD: acetaminophen (TYLENOL) suppository 650 mg  650 mg Rectal Q6H PRN Sunday Spillers, MD      . DISCONTD: acetaminophen (TYLENOL) tablet 650 mg  650 mg Oral Q6H PRN Sunday Spillers, MD   650 mg at 06/23/12 0135  . DISCONTD: insulin aspart (novoLOG)  injection 0-5 Units  0-5 Units Subcutaneous QHS Dow Adolph, MD      . DISCONTD: insulin aspart (novoLOG) injection 0-9 Units  0-9 Units Subcutaneous TID WC Dow Adolph, MD   1 Units at 06/23/12 [phone]  . DISCONTD: morphine 2 MG/ML injection 2-4 mg  2-4 mg Intravenous Q3H PRN Sunday Spillers, MD   2 mg at 06/22/12 2134  . DISCONTD: ondansetron (ZOFRAN) injection 4 mg  4 mg Intravenous Q6H PRN Sunday Spillers, MD      . DISCONTD: ondansetron (ZOFRAN) tablet 4 mg  4 mg Oral Q6H PRN Sunday Spillers, MD      . DISCONTD: sodium chloride 0.9 % injection 3 mL  3 mL Intravenous Q12H Sunday Spillers, MD   3 mL at 06/22/12 1011   Family History  Problem Relation Age of Onset  . Adopted: Yes   History   Social History  . Marital Status: Single    Spouse Name: N/A    Number of Children: N/A  . Years of Education: N/A   Social History Main Topics  . Smoking status: Never Smoker   . Smokeless tobacco: Never Used  . Alcohol Use: 0.0 oz/week     02/25/12 "  couple mixed drinks or beers or glasses of wine/month"  . Drug Use: No  . Sexually Active: Not Currently -- Male partner(s)    Birth Control/ Protection: Condom     refused condoms   Other Topics Concern  . None   Social History Narrative  . None   Review of Systems: A full 12 system ROS was negative except as noted in the HPI.   Objective:  Physical Exam: Filed Vitals:   06/24/12 1534  BP: 120/72  Pulse: 85  Temp: 97.4 F (36.3 C)  TempSrc: Oral  Height: 6' (1.829 m)  Weight: 227 lb 4.8 oz (103.103 kg)  SpO2: 98%   Constitutional: Vital signs reviewed.  Patient is a well-developed and well-nourished man in mild acute distress and cooperative with exam. Alert and oriented x3.  Head: Normocephalic and atraumatic Ear: TM normal bilaterally Mouth: no erythema or exudates, MMM Eyes: PERRL, EOMI, conjunctivae mildly pale, No scleral icterus.  Neck: Supple, Trachea midline normal ROM, No JVD, mass, thyromegaly, or carotid bruit present.    Cardiovascular: RRR, S1 normal, S2 normal, no MRG, pulses symmetric and intact bilaterally Pulmonary/Chest: CTAB, no wheezes, rales, or rhonchi Abdominal: Soft. Non-tender, non-distended, bowel sounds are normal, no masses, organomegaly, or guarding present.  GU: no CVA tenderness Musculoskeletal: No joint deformities, erythema, or stiffness, ROM full and nontender to palpation.  Hematology: no cervical, inginal, or axillary adenopathy.  Neurological: A&O x3, Strength is normal and symmetric bilaterally, cranial nerve II-XII are grossly intact, no focal motor deficit, sensory intact to light touch bilaterally.  Skin: Warm, dry and intact. No rash, cyanosis, or clubbing.  Psychiatric: Normal mood and affect. speech and behavior is normal. Judgment and thought content normal. Cognition and memory are normal.   Assessment & Plan:

## 2012-06-28 ENCOUNTER — Encounter: Payer: Self-pay | Admitting: Internal Medicine

## 2012-06-28 ENCOUNTER — Ambulatory Visit (INDEPENDENT_AMBULATORY_CARE_PROVIDER_SITE_OTHER): Payer: Medicaid Other | Admitting: Internal Medicine

## 2012-06-28 VITALS — BP 136/88 | HR 90 | Temp 97.0°F | Wt 220.7 lb

## 2012-06-28 DIAGNOSIS — E139 Other specified diabetes mellitus without complications: Secondary | ICD-10-CM

## 2012-06-28 DIAGNOSIS — S37011A Minor contusion of right kidney, initial encounter: Secondary | ICD-10-CM

## 2012-06-28 DIAGNOSIS — B20 Human immunodeficiency virus [HIV] disease: Secondary | ICD-10-CM

## 2012-06-28 DIAGNOSIS — N179 Acute kidney failure, unspecified: Secondary | ICD-10-CM

## 2012-06-28 DIAGNOSIS — N189 Chronic kidney disease, unspecified: Secondary | ICD-10-CM

## 2012-06-28 DIAGNOSIS — I10 Essential (primary) hypertension: Secondary | ICD-10-CM

## 2012-06-28 DIAGNOSIS — S37019A Minor contusion of unspecified kidney, initial encounter: Secondary | ICD-10-CM

## 2012-06-28 DIAGNOSIS — E291 Testicular hypofunction: Secondary | ICD-10-CM

## 2012-06-28 DIAGNOSIS — I129 Hypertensive chronic kidney disease with stage 1 through stage 4 chronic kidney disease, or unspecified chronic kidney disease: Secondary | ICD-10-CM

## 2012-06-28 DIAGNOSIS — D649 Anemia, unspecified: Secondary | ICD-10-CM

## 2012-06-28 DIAGNOSIS — E119 Type 2 diabetes mellitus without complications: Secondary | ICD-10-CM

## 2012-06-28 DIAGNOSIS — X58XXXA Exposure to other specified factors, initial encounter: Secondary | ICD-10-CM

## 2012-06-28 LAB — CBC WITH DIFFERENTIAL/PLATELET
Basophils Absolute: 0 10*3/uL (ref 0.0–0.1)
Basophils Relative: 0 % (ref 0–1)
Lymphocytes Relative: 42 % (ref 12–46)
MCHC: 33 g/dL (ref 30.0–36.0)
Neutro Abs: 1.6 10*3/uL — ABNORMAL LOW (ref 1.7–7.7)
Neutrophils Relative %: 42 % — ABNORMAL LOW (ref 43–77)
Platelets: 223 10*3/uL (ref 150–400)
RDW: 15.3 % (ref 11.5–15.5)
WBC: 3.7 10*3/uL — ABNORMAL LOW (ref 4.0–10.5)

## 2012-06-28 LAB — BASIC METABOLIC PANEL
CO2: 25 mEq/L (ref 19–32)
Calcium: 9.5 mg/dL (ref 8.4–10.5)
Creat: 1.59 mg/dL — ABNORMAL HIGH (ref 0.50–1.35)
Glucose, Bld: 112 mg/dL — ABNORMAL HIGH (ref 70–99)

## 2012-06-28 MED ORDER — FERROUS SULFATE 325 (65 FE) MG PO TABS: 325.0000 mg | ORAL_TABLET | Freq: Three times a day (TID) | ORAL | Status: AC

## 2012-06-28 NOTE — Assessment & Plan Note (Signed)
The patient has a history of HTN, previously on amlodipine and labetalol, which were discontinued during his last hospitalization.  The patient's BP is currently within normal range, though on the high end of normal. -continue to follow BP -restart BP meds as indicated if BP rises

## 2012-06-28 NOTE — Assessment & Plan Note (Signed)
The patient reports a history of low testosterone, previously treated with androgel, which he stopped due to perceived ineffectiveness.  Patient notes symptoms mainly of low energy (which may be multifactorial, and include his iron deficiency anemia).  We discussed the risks and benefits of testing and treatment for low testosterone, and the lack of convincing evidence that treatment improves symptoms.  We will continue this discussion at the patient's next visit, and consider re-checking patient's serum testosterone at that time.

## 2012-06-28 NOTE — Assessment & Plan Note (Signed)
The patient has a history of CKD, with a component of AKI during his last hospitalization, thought to be secondary to ATN (hypotension) and AIN (HAART medications). -recheck Cr today to ensure return to baseline after AKI -patient has appointment with Dr. Hyman Hopes (Nephrology) within the next 2 weeks

## 2012-06-28 NOTE — Assessment & Plan Note (Signed)
The patient was recently found to have an A1C of 7.1 during his last hospitalization, representing DM.  He is currently controlled through diet and exercise, and the patient desires to continue to avoid pharmacologic treatment for as long as possible.  We discussed at length the benefit to starting treatment early, to prevent end-organ complications. -continue lifestyle modification -low threshold for starting oral hypoglycemics if A1C continues to worsen

## 2012-06-28 NOTE — Assessment & Plan Note (Signed)
HAART discontinued out of concern for AIN.  HIV genotype pending. -appreciate excellent care by Dr. Orvan Falconer and ID clinic -patient to restart HAART at next visit 10/15 with ID clinic

## 2012-06-28 NOTE — Assessment & Plan Note (Signed)
Pain from renal hematoma is improving, likely representing slow re-absorption of hematoma.

## 2012-06-28 NOTE — Patient Instructions (Signed)
For your low blood levels, we will recheck your blood levels today. -restart iron supplementation with Ferrous Sulfate, 1 tablet three times per day  For your diabetes, continue to monitor your diet and continue your exercise regimen. -we will check your Hemoglobin A1C at your next visit.  Please return for a follow-up visit in 2-4 months.

## 2012-06-28 NOTE — Assessment & Plan Note (Signed)
The patient has a history of chronic anemia, likely representing anemia of chronic disease (CKD, HIV) with iron deficiency anemia (low iron during last hospitalization, mild blood loss) -check cbc today -restart patient's iron supplementation

## 2012-06-28 NOTE — Progress Notes (Signed)
HPI The patient is a 51 y.o. yo male with a history of HIV, HL, DM, presenting for a hospital follow-up.  The patient was recently hospitalized 9/22-9/26 with acute kidney failure, renal hematoma secondary to renal biopsy, and anemia.  Since hospital discharge, the patient notes significant improvement in right flank pain (secondary to renal hematoma) with the use of hydrocodone.  The patient remains off HAART, which was thought to be the cause of his AKI.  He has an HIV genotype pending, and has an appointment with Dr. Orvan Falconer 10/15 for follow-up and to restart a new regimen of HAART.  The patient was found to have an Hb A1C of 7.1 during hospitalization (prior A1C = 6.6).  The patient has been trying diet and exercise changes recently.  He notes that he enjoys walking and biking, though barriers to exercise include stress and fatigue.  The patient has a history of HTN.  He was previously on amlodipine and labetalol, but these were discontinued during hospitalization due to hypotensive while inpatient.  The patient's BP is borderline high-normal today.  The patient notes being diagnosed with low testosterone in the past.  He has used androgel in the past, but stopped due to perceived ineffectiveness.  He notes symptoms of low energy and mildly decreased libido, but no change in distribution of body hair.  ROS: General: no fevers, chills, changes in weight, changes in appetite Skin: no rash HEENT: no blurry vision, hearing changes, sore throat Pulm: no dyspnea, coughing, wheezing CV: no chest pain, palpitations, shortness of breath Abd: no abdominal pain, nausea/vomiting, diarrhea/constipation GU: no dysuria, hematuria, polyuria Ext: no arthralgias, myalgias Neuro: no weakness, numbness, or tingling  Filed Vitals:   06/28/12 1027  BP: 136/88  Pulse:   Temp:     PEX General: alert, cooperative, and in no apparent distress HEENT: pupils equal round and reactive to light, vision grossly  intact, oropharynx clear and non-erythematous  Neck: supple, no lymphadenopathy Lungs: clear to ascultation bilaterally, normal work of respiration, no wheezes, rales, ronchi Heart: regular rate and rhythm, no murmurs, gallops, or rubs Abdomen: soft, non-tender, non-distended, normal bowel sounds Back: mild left flank tenderness to deep palpation Extremities: no cyanosis, clubbing, or edema Neurologic: alert & oriented X3, cranial nerves II-XII intact, strength grossly intact, sensation intact to light touch  Current Outpatient Prescriptions on File Prior to Visit  Medication Sig Dispense Refill  . acetaminophen (TYLENOL) 325 MG tablet Take 2 tablets (650 mg total) by mouth every 6 (six) hours as needed (or Fever >/= 101).  15 tablet  0  . HYDROcodone-acetaminophen (NORCO/VICODIN) 5-325 MG per tablet Take 1 tablet by mouth every 6 (six) hours as needed for pain.  45 tablet  0    Assessment/Plan

## 2012-06-30 ENCOUNTER — Inpatient Hospital Stay: Payer: Medicaid Other | Admitting: Internal Medicine

## 2012-07-01 ENCOUNTER — Telehealth: Payer: Self-pay | Admitting: Internal Medicine

## 2012-07-01 NOTE — Telephone Encounter (Signed)
I called the Patrick [name] and left a message on his cell phone answering machine. I asked him to call me if he has any problems or concerns before his visit with me on October 15.

## 2012-07-05 ENCOUNTER — Telehealth: Payer: Self-pay | Admitting: *Deleted

## 2012-07-05 NOTE — Telephone Encounter (Signed)
Received call from [facility] stating that they are to see the pt tomorrow & wondering why they are seeing.  Referral from Dr. Gaylyn Rong & D/C summary faxed to [facility] @ [phone].

## 2012-07-12 ENCOUNTER — Encounter: Payer: Self-pay | Admitting: Internal Medicine

## 2012-07-12 ENCOUNTER — Ambulatory Visit (INDEPENDENT_AMBULATORY_CARE_PROVIDER_SITE_OTHER): Payer: Medicaid Other | Admitting: Internal Medicine

## 2012-07-12 VITALS — BP 149/81 | HR 93 | Temp 98.5°F | Ht 72.0 in | Wt 221.0 lb

## 2012-07-12 DIAGNOSIS — B20 Human immunodeficiency virus [HIV] disease: Secondary | ICD-10-CM

## 2012-07-12 DIAGNOSIS — N189 Chronic kidney disease, unspecified: Secondary | ICD-10-CM

## 2012-07-12 MED ORDER — LAMIVUDINE 150 MG PO TABS: 150.0000 mg | ORAL_TABLET | Freq: Every day | ORAL | Status: AC

## 2012-07-12 MED ORDER — RITONAVIR 100 MG PO TABS: 100.0000 mg | ORAL_TABLET | Freq: Every day | ORAL | Status: AC

## 2012-07-12 MED ORDER — ABACAVIR SULFATE 300 MG PO TABS: 600.0000 mg | ORAL_TABLET | Freq: Every day | ORAL | Status: AC

## 2012-07-12 MED ORDER — ATAZANAVIR SULFATE 300 MG PO CAPS: 300.0000 mg | ORAL_CAPSULE | Freq: Every day | ORAL | Status: AC

## 2012-07-12 NOTE — Progress Notes (Signed)
Patient ID: Patrick [name], male   DOB: 1961/04/10, 51 y.o.   MRN: [id number]     [facility]  Patient Active Problem List  Diagnosis  . HIV DISEASE  . GOUT, UNSPECIFIED  . HYPERTENSION  . SECONDARY HYPERPARATHYROIDISM  . NEPHROLITHIASIS  . LYMPHADENOPATHY, DIFFUSE  . Hypogonadism male  . Vitamin B12 deficiency  . Diabetes, Type II  . Renal hematoma, right  . Anemia  . Chronic kidney disease    Patient's Medications  New Prescriptions   ABACAVIR (ZIAGEN) 300 MG TABLET    Take 2 tablets (600 mg total) by mouth daily.   ATAZANAVIR (REYATAZ) 300 MG CAPSULE    Take 1 capsule (300 mg total) by mouth daily with breakfast.   LAMIVUDINE (EPIVIR) 150 MG TABLET    Take 1 tablet (150 mg total) by mouth daily.   RITONAVIR (NORVIR) 100 MG TABS    Take 1 tablet (100 mg total) by mouth daily.  Previous Medications   ACETAMINOPHEN (TYLENOL) 325 MG TABLET    Take 2 tablets (650 mg total) by mouth every 6 (six) hours as needed (or Fever >/= 101).   FERROUS SULFATE 325 (65 FE) MG TABLET    Take 1 tablet (325 mg total) by mouth 3 (three) times daily.  Modified Medications   No medications on file  Discontinued Medications   HYDROCODONE-ACETAMINOPHEN (NORCO/VICODIN) 5-325 MG PER TABLET    Take 1 tablet by mouth every 6 (six) hours as needed for pain.    Subjective: He is in for his routine hospital followup visit. He is admitted again last month with more fever, adenopathy, cytopenias and his renal insufficiency. I suggested stopping his Atripla and he is an of antiretroviral therapy since then so that he would not continue to be exposed to the Viread component of Atripla which can cause renal insufficiency. He underwent a renal biopsy and had some postop hematoma. Unfortunately the biopsy was nondiagnostic. He was seen yesterday at the [facility] hematology clinic by Dr. Stark Klein. I have not received those records yet.  Objective: Temp: 98.5 F (36.9 C) (10/15 1443) Temp  src: Other (Comment) (10/15 1443) BP: 149/81 mmHg (10/15 1443) Pulse Rate: 93  (10/15 1443)  General: he is in good spirits Skin: no rash and his adenopathy has improved dramatically again Lungs: clear Cor: regular S1 and S2 no murmurs Abdomen: obese soft and nontender  Lab Results HIV 1 RNA Quant (copies/mL)  Date Value  06/06/2012 <20   05/05/2012 <20   01/05/2012 <20      CD4 T Cell Abs (cmm)  Date Value  06/06/2012 180*  05/05/2012 370*  01/05/2012 210*     Assessment: They're still no clear explanation for his relapsing fever, lymphadenopathy, and cytopenias.her there is also no clear evidence for his renal insufficiency. I will start him on a new salvage regimen of renally dosed Epivir, Ziagen, Prezista and Norvir. I will recheck his creatinine today. I will also screen him for HHV 8 infection as that might cause relapsing lymphoproliferative disease in the setting of HIV infection. His blood pressure is reasonably good today so I will withhold restarting antihypertensive therapy until his next visit.  Plan: 1. Start Epivir, Ziagen, Prezista and Norvir 2. Check BMP and HHV 8 viral load 3. Followup after HIV blood work in 6 weeks   Cliffton Asters, MD [facility] [phone] pager   [phone] cell 07/12/2012, 4:46 PM

## 2012-07-13 LAB — BASIC METABOLIC PANEL
BUN: 15 mg/dL (ref 6–23)
Potassium: 4.4 mEq/L (ref 3.5–5.3)
Sodium: 135 mEq/L (ref 135–145)

## 2012-07-13 NOTE — Addendum Note (Signed)
Addended by: Jennet Maduro D on: 07/13/2012 12:28 PM   Modules accepted: Orders

## 2012-07-20 ENCOUNTER — Other Ambulatory Visit: Payer: Self-pay | Admitting: *Deleted

## 2012-08-19 ENCOUNTER — Other Ambulatory Visit: Payer: Self-pay | Admitting: Internal Medicine

## 2012-08-20 ENCOUNTER — Emergency Department (HOSPITAL_COMMUNITY)
Admission: EM | Admit: 2012-08-20 | Discharge: 2012-08-20 | Disposition: A | Discharge status: Home / Self Care | Payer: Medicaid Other | Source: Home / Self Care

## 2012-08-20 ENCOUNTER — Emergency Department (INDEPENDENT_AMBULATORY_CARE_PROVIDER_SITE_OTHER): Payer: Medicaid Other

## 2012-08-20 ENCOUNTER — Encounter (HOSPITAL_COMMUNITY): Payer: Self-pay

## 2012-08-20 DIAGNOSIS — J40 Bronchitis, not specified as acute or chronic: Secondary | ICD-10-CM

## 2012-08-20 DIAGNOSIS — B20 Human immunodeficiency virus [HIV] disease: Secondary | ICD-10-CM

## 2012-08-20 DIAGNOSIS — I10 Essential (primary) hypertension: Secondary | ICD-10-CM

## 2012-08-20 DIAGNOSIS — Z79899 Other long term (current) drug therapy: Secondary | ICD-10-CM

## 2012-08-20 DIAGNOSIS — J069 Acute upper respiratory infection, unspecified: Secondary | ICD-10-CM

## 2012-08-20 DIAGNOSIS — J209 Acute bronchitis, unspecified: Secondary | ICD-10-CM

## 2012-08-20 DIAGNOSIS — E119 Type 2 diabetes mellitus without complications: Secondary | ICD-10-CM

## 2012-08-20 LAB — POCT I-STAT, CHEM 8
BUN: 19 mg/dL (ref 6–23)
Creatinine, Ser: 1.5 mg/dL — ABNORMAL HIGH (ref 0.50–1.35)
Sodium: 133 mEq/L — ABNORMAL LOW (ref 135–145)
TCO2: 22 mmol/L (ref 0–100)

## 2012-08-20 LAB — POCT RAPID STREP A: Streptococcus, Group A Screen (Direct): NEGATIVE

## 2012-08-20 MED ORDER — ALBUTEROL SULFATE HFA 108 (90 BASE) MCG/ACT IN AERS: 2.0000 | INHALATION_SPRAY | RESPIRATORY_TRACT | Status: DC | PRN

## 2012-08-20 MED ORDER — ALBUTEROL SULFATE (5 MG/ML) 0.5% IN NEBU
INHALATION_SOLUTION | RESPIRATORY_TRACT | Status: AC
  Filled 2012-08-20: qty 1

## 2012-08-20 MED ORDER — ALBUTEROL SULFATE (5 MG/ML) 0.5% IN NEBU
2.5000 mg | INHALATION_SOLUTION | Freq: Once | RESPIRATORY_TRACT | Status: AC
  Administered 2012-08-20: 2.5 mg via RESPIRATORY_TRACT

## 2012-08-20 MED ORDER — CEFDINIR 300 MG PO CAPS: 300.0000 mg | ORAL_CAPSULE | Freq: Two times a day (BID) | ORAL | Status: DC

## 2012-08-20 MED ORDER — IPRATROPIUM BROMIDE 0.02 % IN SOLN
0.5000 mg | Freq: Once | RESPIRATORY_TRACT | Status: AC
  Administered 2012-08-20: 0.5 mg via RESPIRATORY_TRACT

## 2012-08-20 NOTE — ED Provider Notes (Signed)
Medical screening examination/treatment/procedure(s) were performed by resident physician or non-physician practitioner and as supervising physician I was immediately available for consultation/collaboration.   Percy Comp DOUGLAS MD.    Dean Goldner D Aleeah Greeno, MD 08/20/12 1136 

## 2012-08-20 NOTE — ED Provider Notes (Signed)
History     CSN: [id number]  Arrival date & time 08/20/12  4098   None     Chief Complaint  Patient presents with  . URI    (Consider location/radiation/quality/duration/timing/severity/associated sxs/prior treatment) HPI Comments: 51 year old male with complaints of approximately one week of sniffles and sneezing. This is progressed into head and ears feeling full. This was associated with a temperature 100.1 degrees at home off and on. His been using a friend for nasal congestion. He has mild shortness of breath but denies actual sore throat. He has had loose stools for 2 days. He has a history of HIV and intermittently will have adenopathy and today he notes that he has anterior cervical adenopathy.    Past Medical History  Diagnosis Date  . Hypertension   . HIV (human immunodeficiency virus infection) 2010  . Hypogonadism male   . CKD (chronic kidney disease) stage 3, GFR 30-59 ml/min     Baseline Cr = 1.4  . Kidney stones   . Heart murmur   . Anemia   . Hernia   . Pancytopenia with fever 02/26/2012  . LYMPHADENOPATHY, DIFFUSE 11/18/2010  . Gallstones     Past Surgical History  Procedure Date  . Lithotripsy 2010  . Appendectomy 2001    via midline incision  . Axillary lymph node dissection 2011; 2012    left    Family History  Problem Relation Age of Onset  . Adopted: Yes    History  Substance Use Topics  . Smoking status: Never Smoker   . Smokeless tobacco: Never Used  . Alcohol Use: 0.0 oz/week     Comment: 02/25/12 "couple mixed drinks or beers or glasses of wine/month"      Review of Systems  Constitutional: Positive for fever. Negative for chills.  HENT: Positive for congestion, rhinorrhea, sneezing, postnasal drip and sinus pressure. Negative for ear pain, sore throat, trouble swallowing, neck pain and neck stiffness.   Respiratory: Positive for shortness of breath. Negative for stridor.   Cardiovascular: Negative.   Gastrointestinal: Positive  for diarrhea. Negative for vomiting, abdominal pain and rectal pain.  Genitourinary: Negative.   Musculoskeletal: Negative.   Skin: Negative.   Neurological: Negative.   Psychiatric/Behavioral: Negative.     Allergies  Codeine and Sulfonamide derivatives  Home Medications   Current Outpatient Rx  Name  Route  Sig  Dispense  Refill  . ABACAVIR SULFATE 300 MG PO TABS   Oral   Take 2 tablets (600 mg total) by mouth daily.   60 tablet   11   . ACETAMINOPHEN 325 MG PO TABS   Oral   Take 2 tablets (650 mg total) by mouth every 6 (six) hours as needed (or Fever >/= 101).   15 tablet   0   . ALBUTEROL SULFATE HFA 108 (90 BASE) MCG/ACT IN AERS   Inhalation   Inhale 2 puffs into the lungs every 4 (four) hours as needed for wheezing. Dispense with aerochamber   1 Inhaler   0   . ATAZANAVIR SULFATE 300 MG PO CAPS   Oral   Take 1 capsule (300 mg total) by mouth daily with breakfast.   30 capsule   11   . CEFDINIR 300 MG PO CAPS   Oral   Take 1 capsule (300 mg total) by mouth 2 (two) times daily.   14 capsule   0   . FERROUS SULFATE 325 (65 FE) MG PO TABS   Oral   Take 1  tablet (325 mg total) by mouth 3 (three) times daily.   90 tablet   3   . LABETALOL HCL 200 MG PO TABS      TAKE 1 TABLET BY MOUTH TWICE DAILY   60 tablet   0   . LAMIVUDINE 150 MG PO TABS   Oral   Take 1 tablet (150 mg total) by mouth daily.   30 tablet   11   . RITONAVIR 100 MG PO TABS   Oral   Take 1 tablet (100 mg total) by mouth daily.   30 tablet   11     BP 157/86  Pulse 94  Temp 98.6 F (37 C) (Oral)  Resp 20  SpO2 100%  Physical Exam  Constitutional: He is oriented to person, place, and time. He appears well-developed. No distress.  HENT:  Head: Normocephalic and atraumatic.       TMs are pearly gray transparent without erythema or effusion. Both are mildly retracted Oropharynx with minor erythema no exudates or edema.  Eyes: Conjunctivae normal and EOM are normal.    Neck: Normal range of motion. Neck supple.  Cardiovascular: Normal rate, regular rhythm and normal heart sounds.   Pulmonary/Chest: Effort normal. No respiratory distress. He has wheezes. He has no rales.       Moderate wheezes bilaterally. Prolonged expiratory phase.  Musculoskeletal: He exhibits no edema.  Lymphadenopathy:    He has cervical adenopathy.  Neurological: He is alert and oriented to person, place, and time. He exhibits normal muscle tone. Coordination normal.  Skin: Skin is warm and dry.  Psychiatric: He has a normal mood and affect.    ED Course  Procedures (including critical care time)  Labs Reviewed  POCT I-STAT, CHEM 8 - Abnormal; Notable for the following:    Sodium 133 (*)     Creatinine, Ser 1.50 (*)     Glucose, Bld 191 (*)     Calcium, Ion 1.27 (*)     Hemoglobin 12.6 (*)     HCT 37.0 (*)     All other components within normal limits  POCT RAPID STREP A ([facility]RG CARE ONLY)   Dg Chest 2 View  08/20/2012  *RADIOLOGY REPORT*  Clinical Data: Cough, fever, dyspnea and history of HIV.  CHEST - 2 VIEW  Comparison: 06/11/2012  Findings: Minimal bibasilar scarring/atelectasis.  No infiltrates, nodules or edema identified.  No pleural effusions.  Normal heart size and mediastinal contours.  No bony abnormalities.  IMPRESSION: Minimal bibasilar atelectasis/scarring.  No focal infiltrates.   Original Report Authenticated By: Irish Lack, M.D.      1. Bronchitis, acute, with bronchospasm   2. HIV (human immunodeficiency virus infection)   3. URI (upper respiratory infection)       MDM  Omnicef 300 mg twice a day Albuterol HFA 2 puffs every 4 hours when necessary cough and wheeze Duo neb in the office: There is moderate improvement in wheezing and air movement after the DuoNeb. He states he can breathe a little bit better and there is less perceived congestion. We discussed OTC meds for his symptoms such as Sudafed PE 10 mg every 4 hours when necessary upper  respiratory congestion Robitussin-DM for cough and expectorant Claritin 10 mg daily for drainage. Followup with your PCP next week, call for appointment if worse may return. Drink more fluids stay well hydrated male in her try some Gatorade however do not increase the amount of sugar as you have reported you have impaired  fasting glucose.        Hayden Rasmussen, NP 08/20/12 1110

## 2012-08-20 NOTE — ED Notes (Signed)
C/o 1 week duration cough, congestion, chest sore, facial pain and pressure

## 2012-08-23 ENCOUNTER — Encounter: Payer: Self-pay | Admitting: Internal Medicine

## 2012-08-23 ENCOUNTER — Ambulatory Visit (INDEPENDENT_AMBULATORY_CARE_PROVIDER_SITE_OTHER): Payer: Medicaid Other | Admitting: Internal Medicine

## 2012-08-23 VITALS — BP 134/71 | HR 95 | Temp 100.5°F | Ht 72.0 in | Wt 229.6 lb

## 2012-08-23 DIAGNOSIS — I10 Essential (primary) hypertension: Secondary | ICD-10-CM

## 2012-08-23 DIAGNOSIS — B20 Human immunodeficiency virus [HIV] disease: Secondary | ICD-10-CM

## 2012-08-23 DIAGNOSIS — J40 Bronchitis, not specified as acute or chronic: Secondary | ICD-10-CM

## 2012-08-23 NOTE — Progress Notes (Signed)
Subjective:   Patient ID: Patrick [name] male   DOB: 10-Jan-1961 51 y.o.   MRN: [id number]  HPI: 51 year old man with past medical history significant for HIV with last CD4 count of 180 in viral load less than 20 in September 2013, hypertension, CKD stage 2/3 presents to the clinic for an urgent care followup.  He was recently seen in the urgent care on 08/20/2012 for flulike symptoms for approximately one week . Diagnosis of bronchitis was established and he was discharged on Omnicef 300 mg twice daily with albuterol, Sudafed and Robitussin.   He states that he has been taking all his medications as prescribed by the urgent care. He states that his bronchospasm has improved but some of the symptoms are still persistent. He continues to have low grade fevers with Tmax ~101 reported by the patient.  He reports having cough- intermittently dry and productive bringing up greenish phlegm. Reports having pain in his ribs when he coughs,  + myalgias, + rhinorrhea,+ sneezing, + sore throat , + sneezing and watering from the eyes. Denies any  hemoptysis. He has also noted cervical lymphadenopathy.   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 Hypertension   . HIV (human immunodeficiency virus infection) 2010  . Hypogonadism male   . CKD (chronic kidney disease) stage 3, GFR 30-59 ml/min     Baseline Cr = 1.4  . Kidney stones   . Heart murmur   . Anemia   . Hernia   . Pancytopenia with fever 02/26/2012  . LYMPHADENOPATHY, DIFFUSE 11/18/2010  . Gallstones    Family History  Problem Relation Age of Onset  . Adopted: Yes   History   Social History  . Marital Status: Single    Spouse Name: N/A    Number of Children: N/A  . Years of Education: N/A    Occupational History  . Not on file.   Social History Main Topics  . Smoking status: Never Smoker   . Smokeless tobacco: Never Used  . Alcohol Use: 0.0 oz/week     Comment: 02/25/12 "couple mixed drinks or beers or glasses of wine/month"  . Drug Use: No  . Sexually Active: Not on file   Other Topics Concern  . Not on file   Social History Narrative  . No narrative on file   Review of Systems: General: Denies fever, chills, diaphoresis, appetite change and fatigue. HEENT: Denies photophobia, eye pain, redness, hearing loss, ear pain, congestion, sore throat, rhinorrhea, sneezing, mouth sores, trouble swallowing, neck pain, neck stiffness and tinnitus. Respiratory: Denies SOB, DOE, , and wheezing, +cough, +chest tightness. Cardiovascular: Denies to chest pain, palpitations and leg swelling. Gastrointestinal: Denies nausea, vomiting, abdominal pain, diarrhea, constipation, blood in stool and abdominal distention. Genitourinary: Denies dysuria, urgency, frequency, hematuria, flank pain and difficulty urinating. Musculoskeletal: Denies , back pain, joint swelling, arthralgias and gait problem, + myalgias.  Skin: Denies pallor, rash and wound. Neurological: Denies dizziness, seizures, syncope, weakness, light-headedness, numbness and headaches. Hematological: Denies , easy bruising, personal or family bleeding history, + adenopathy. Psychiatric/Behavioral: Denies suicidal ideation, mood changes, confusion, nervousness, sleep disturbance and agitation.    Current Outpatient Medications: Current Outpatient Prescriptions  Medication Sig Dispense Refill  . abacavir (ZIAGEN) 300 MG tablet Take 2 tablets (600 mg  total) by mouth daily.  60 tablet  11  . acetaminophen (TYLENOL) 325 MG tablet Take 2 tablets (650 mg total) by mouth every 6 (six) hours as needed (or Fever >/= 101).  15 tablet  0  . albuterol (PROVENTIL HFA;VENTOLIN HFA) 108 (90 BASE) MCG/ACT inhaler Inhale 2 puffs into the  lungs every 4 (four) hours as needed for wheezing. Dispense with aerochamber  1 Inhaler  0  . atazanavir (REYATAZ) 300 MG capsule Take 1 capsule (300 mg total) by mouth daily with breakfast.  30 capsule  11  . cefdinir (OMNICEF) 300 MG capsule Take 1 capsule (300 mg total) by mouth 2 (two) times daily.  14 capsule  0  . ferrous sulfate 325 (65 FE) MG tablet Take 1 tablet (325 mg total) by mouth 3 (three) times daily.  90 tablet  3  . labetalol (NORMODYNE) 200 MG tablet TAKE 1 TABLET BY MOUTH TWICE DAILY  60 tablet  0  . lamiVUDine (EPIVIR) 150 MG tablet Take 1 tablet (150 mg total) by mouth daily.  30 tablet  11  . ritonavir (NORVIR) 100 MG TABS Take 1 tablet (100 mg total) by mouth daily.  30 tablet  11    Allergies: Allergies  Allergen Reactions  . Codeine Itching, Rash and Other (See Comments)    "crazy dreams"  . Sulfonamide Derivatives Rash      Objective:   Physical Exam: Filed Vitals:   08/23/12 1459  BP: 134/71  Pulse: 95  Temp: 100.5 F (38.1 C)    General: Vital signs reviewed and noted. Well-developed, well-nourished, in no acute distress; alert, appropriate and cooperative throughout examination. HEENT: Normocephalic, atraumatic, pharyngeal erythema without exudates Lymphadenopathy: b/l cervical lymphadenopathy + Lungs: Normal respiratory effort. Coarse breath sounds bilaterally with diffuse exp wheezes. Heart: RRR. S1 and S2 normal without gallop, murmur, or rubs. Abdomen:BS normoactive. Soft, Nondistended, non-tender.  No masses or organomegaly. Extremities: No pretibial edema.     Assessment & Plan:

## 2012-08-23 NOTE — Patient Instructions (Addendum)
Please schedule a follow up appointment in  1 week  . Please bring your medication bottles with your next appointment. Please take your medicines as prescribed. Please call us if your sore throat, cough gets worse or you start having high grade fevers.

## 2012-08-23 NOTE — Assessment & Plan Note (Signed)
Patient presents with ~ one-week symptoms of cough, congestion, sore throat, rhinorrhea and low-grade fevers as urgent care follow up . On exam he was noted to have low grade fever , diffuse bilateral expiratory wheezes and pharyngeal erythema with bilateral cervical lymphadenopathy. His labs and imaging done on 08/20/2012 as a part of Urgent care visit were reviewed with stable hemoglobin and mildly elevated creatinine likely secondary to dehydration. The white count was not done. Patient is on day 4 of his treatment. I think his presentation and symptoms are likely consistent with bronchitis this time.But given his complicated presentation requiring hospitalization in 09/13, he was advised close follow up.  -Advised him to finish up his course of antibiotics. -Continue symptomatic treatment with Robitussin, Sudafed and albuterol inhaler. -Followup in one week if his symptoms persist or get worse. -Recheck labs with next visit if symptoms persist.

## 2012-08-23 NOTE — Assessment & Plan Note (Signed)
Blood pressure stable. Continue to monitor on current regimen.

## 2012-08-23 NOTE — Assessment & Plan Note (Signed)
He follows with Dr. Orvan Falconer and started on HIV regimen( renally dosed) with his last visit. Repeat CD4 count and VL in 6 weeks.

## 2012-08-24 ENCOUNTER — Other Ambulatory Visit: Payer: Self-pay | Admitting: *Deleted

## 2012-08-24 DIAGNOSIS — I10 Essential (primary) hypertension: Secondary | ICD-10-CM

## 2012-08-24 MED ORDER — LABETALOL HCL 200 MG PO TABS: 200.0000 mg | ORAL_TABLET | Freq: Two times a day (BID) | ORAL | Status: DC

## 2012-08-29 ENCOUNTER — Ambulatory Visit (INDEPENDENT_AMBULATORY_CARE_PROVIDER_SITE_OTHER): Payer: Medicaid Other | Admitting: Internal Medicine

## 2012-08-29 ENCOUNTER — Encounter: Payer: Self-pay | Admitting: Internal Medicine

## 2012-08-29 VITALS — BP 128/71 | HR 91 | Temp 98.1°F | Ht 72.0 in | Wt 226.4 lb

## 2012-08-29 DIAGNOSIS — J40 Bronchitis, not specified as acute or chronic: Secondary | ICD-10-CM

## 2012-08-29 DIAGNOSIS — E119 Type 2 diabetes mellitus without complications: Secondary | ICD-10-CM

## 2012-08-29 DIAGNOSIS — Z79899 Other long term (current) drug therapy: Secondary | ICD-10-CM

## 2012-08-29 DIAGNOSIS — E139 Other specified diabetes mellitus without complications: Secondary | ICD-10-CM

## 2012-08-29 LAB — GLUCOSE, CAPILLARY: Glucose-Capillary: 264 mg/dL — ABNORMAL HIGH (ref 70–99)

## 2012-08-29 MED ORDER — BENZONATATE 100 MG PO CAPS: 100.0000 mg | ORAL_CAPSULE | Freq: Three times a day (TID) | ORAL | Status: DC | PRN

## 2012-08-29 MED ORDER — ALBUTEROL SULFATE HFA 108 (90 BASE) MCG/ACT IN AERS: 2.0000 | INHALATION_SPRAY | RESPIRATORY_TRACT | Status: DC | PRN

## 2012-08-29 NOTE — Progress Notes (Signed)
Subjective:    Patient: Patrick [name]   Age/ Gender: 51 y.o., male   MRN: [id number]  DOB: 06-23-1961     HPI: Patrick [name] is a 51 y.o. with a PMHx of HIV with last CD4 count of 180 in viral load less than 20 in September 2013, hypertension, CKD stage 2/3, who presented to clinic today for the following:  1) URI - patient presents for reevaluation of URI. He was seen in clinic on 11/26, and was previously seen at [facility] on 11/23 for URI for which he was prescribed Omnicef - he has completed all of this medication 2 days ago without missing doses. He continues to have nocturnal fevers up to 102.6, which has been a chronic and unresolved issue for the patient (being seen by [facility] and at [facility] oncology).   Since last visit on 11/26, patient states that his productive cough is improving although not resolved. Continues to have nasal congestion, ear congestion, head pressure. Denies itching, watery eyes. His breathing is improved as well, feeling less short of breath, at time of onset using albuterol q4h, however, now requiring only 2 times daily for wheezing. Denies any hemoptysis. He has also noted cervical lymphadenopathy.    Review of Systems: Per HPI.    Current Outpatient Medications: Medication Sig  . abacavir (ZIAGEN) 300 MG tablet Take 2 tablets (600 mg total) by mouth daily.  Marland Kitchen acetaminophen (TYLENOL) 325 MG tablet Take 2 tablets (650 mg total) by mouth every 6 (six) hours as needed (or Fever >/= 101).  Marland Kitchen albuterol (PROVENTIL HFA;VENTOLIN HFA) 108 (90 BASE) MCG/ACT inhaler Inhale 2 puffs into the lungs every 4 (four) hours as needed for wheezing. Dispense with aerochamber  . atazanavir (REYATAZ) 300 MG capsule Take 1 capsule (300 mg total) by mouth daily with breakfast.  . cefdinir (OMNICEF) 300 MG capsule Take 1 capsule (300 mg total) by mouth 2 (two) times daily.  . ferrous sulfate 325 (65 FE) MG tablet Take 1 tablet (325 mg total) by mouth 3 (three) times daily.  Marland Kitchen labetalol  (NORMODYNE) 200 MG tablet Take 1 tablet (200 mg total) by mouth 2 (two) times daily.  Marland Kitchen lamiVUDine (EPIVIR) 150 MG tablet Take 1 tablet (150 mg total) by mouth daily.  . ritonavir (NORVIR) 100 MG TABS Take 1 tablet (100 mg total) by mouth daily.     Allergies  Allergen Reactions  . Codeine Itching, Rash and Other (See Comments)    "crazy dreams"  . Sulfonamide Derivatives Rash    Past Medical History  Diagnosis Date  . Hypertension   . HIV (human immunodeficiency virus infection) 2010  . Hypogonadism male   . CKD (chronic kidney disease) stage 3, GFR 30-59 ml/min     Baseline Cr = 1.4  . Kidney stones   . Heart murmur   . Anemia   . Hernia   . Pancytopenia with fever 02/26/2012  . LYMPHADENOPATHY, DIFFUSE 11/18/2010  . Gallstones     Past Surgical History  Procedure Date  . Lithotripsy 2010  . Appendectomy 2001    via midline incision  . Axillary lymph node dissection 2011; 2012    left     Objective:    Physical Exam: Filed Vitals:   08/29/12 1347  BP: 128/71  Pulse: 91  Temp: 98.1 F (36.7 C)      General: Vital signs reviewed and noted. Well-developed, well-nourished, in no acute distress; alert, appropriate and cooperative throughout examination.  Head: Normocephalic, atraumatic.  Eyes:  conjunctivae/corneas clear. PERRL.  Ears: TM nonerythematous, not bulging, good light reflex bilaterally.  Nose: Mucous membranes moist, not inflammed, nonerythematous.  Throat: Oropharynx mildly erythematous, no exudate appreciated.   Neck: No deformities, or tenderness noted. Anterior cervical lymphadenopathy noted on right, fluctuant and soft.  Lungs:  Normal respiratory effort. Clear to auscultation BL without crackles or wheezes.  Heart: RRR. S1 and S2 normal without gallop, rubs. (+) murmur.  Abdomen:  BS normoactive. Soft, Nondistended, non-tender.  No masses or organomegaly.  Extremities: No pretibial edema.      Assessment/ Plan:   Case and plan of care  discussed with attending physician, Dr. Jonah Blue.

## 2012-08-29 NOTE — Patient Instructions (Signed)
General Instructions:  Please follow-up at the clinic in 1-2 months with your PCP, at which time we will reevaluate your blood sugars, blood pressure - OR, please follow-up in the clinic sooner if needed.  There have been changes in your medications:  START Tessalon Perles for cough as needed.  Refill of Albuterol sent to your pharmacy.   Continue to work on losing weight and increasing your exercise (as tolerated).  Call the clinic and please be seen if you are feeling worse.  If you have been started on new medication(s), and you develop symptoms concerning for allergic reaction, including, but not limited to, throat closing, tongue swelling, rash, please stop the medication immediately and call the clinic at [phone], and go to the ER.  If you are diabetic, please bring your meter to your next visit.  If symptoms worsen, or new symptoms arise, please call the clinic or go to the ER.  Please bring all of your medications in a bag to your next visit.    Treatment Goals:  Goals (1 Years of Data) as of 08/29/2012              Lifestyle    . Increase physical activity     . Increase physical activity to 4 times a week       Progress Toward Treatment Goals:  Treatment Goal 08/29/2012  Hemoglobin A1C improved  Blood pressure at goal    Self Care Goals & Plans:  Self Care Goal 08/29/2012  Manage my medications take my medicines as prescribed  Monitor my health keep track of my weight  Eat healthy foods drink diet soda or water instead of juice or soda  Be physically active find an activity I enjoy       Care Management & Community Referrals:

## 2012-08-29 NOTE — Assessment & Plan Note (Addendum)
Hemoglobin A1C  Date Value Range Status  08/29/2012 6.7   Final  06/06/2012 7.1* <5.7 % Final  02/25/2012 6.6* <5.7 % Final     Assessment:  Diabetes control: good control (HgbA1C at goal)  Progress toward A1C goal:  improved  Comments: Currently diet controlled and does not want to pursue any medications.  Plan:  Medications:  Continue lifestyle modifications - pt prefers to try to lose weight first.  Home glucose monitoring:   Frequency:     Timing:    Instruction/counseling given: discussed the need for weight loss and discussed diet

## 2012-08-29 NOTE — Assessment & Plan Note (Signed)
Assessment: Symptoms improving at this time and at this time, only with residual cough, that is also improving. Although pt describes occasional wheezing, his requirement for albuterol has decreased significantly. Well, my exam does not reveal any wheezes. He has completed a course of Omnicef.  Plan:      Continue supportive care with when necessary albuterol.  Will also add Tessalon Perles for cough.   Instructed to return to clinic if symptoms worsen or new symptoms arise.

## 2012-08-30 ENCOUNTER — Other Ambulatory Visit: Payer: Medicaid Other

## 2012-09-02 ENCOUNTER — Other Ambulatory Visit: Payer: Self-pay | Admitting: Internal Medicine

## 2012-09-12 ENCOUNTER — Other Ambulatory Visit: Payer: Medicaid Other

## 2012-09-13 ENCOUNTER — Ambulatory Visit: Payer: Medicaid Other | Admitting: Internal Medicine

## 2012-10-05 ENCOUNTER — Ambulatory Visit: Payer: Medicaid Other | Admitting: Internal Medicine

## 2012-10-11 ENCOUNTER — Other Ambulatory Visit: Payer: Medicaid Other

## 2012-10-19 ENCOUNTER — Telehealth: Payer: Self-pay | Admitting: *Deleted

## 2012-10-19 ENCOUNTER — Ambulatory Visit (INDEPENDENT_AMBULATORY_CARE_PROVIDER_SITE_OTHER): Payer: Medicaid Other | Admitting: Internal Medicine

## 2012-10-19 ENCOUNTER — Encounter: Payer: Self-pay | Admitting: Internal Medicine

## 2012-10-19 VITALS — BP 125/79 | HR 87 | Temp 98.3°F | Ht 72.0 in | Wt 231.0 lb

## 2012-10-19 DIAGNOSIS — B20 Human immunodeficiency virus [HIV] disease: Secondary | ICD-10-CM

## 2012-10-19 DIAGNOSIS — R599 Enlarged lymph nodes, unspecified: Secondary | ICD-10-CM

## 2012-10-19 DIAGNOSIS — B079 Viral wart, unspecified: Secondary | ICD-10-CM | POA: Insufficient documentation

## 2012-10-19 DIAGNOSIS — B078 Other viral warts: Secondary | ICD-10-CM | POA: Insufficient documentation

## 2012-10-19 DIAGNOSIS — R21 Rash and other nonspecific skin eruption: Secondary | ICD-10-CM

## 2012-10-19 LAB — CBC
MCV: 90.7 fL (ref 78.0–100.0)
Platelets: 171 10*3/uL (ref 150–400)
RBC: 3.35 MIL/uL — ABNORMAL LOW (ref 4.22–5.81)
WBC: 5.1 10*3/uL (ref 4.0–10.5)

## 2012-10-19 LAB — COMPREHENSIVE METABOLIC PANEL
ALT: <8 U/L (ref 0–53)
Albumin: 3.3 g/dL — ABNORMAL LOW (ref 3.5–5.2)
CO2: 26 mEq/L (ref 19–32)
Calcium: 9 mg/dL (ref 8.4–10.5)
Chloride: 101 mEq/L (ref 96–112)
Sodium: 134 mEq/L — ABNORMAL LOW (ref 135–145)
Total Protein: 6.9 g/dL (ref 6.0–8.3)

## 2012-10-19 NOTE — Telephone Encounter (Signed)
Patient called and advised he is feeling like he did when he was last admitted in the hospital. He advised "I just cant explain it just I just feel bad and it is familiar." Also advised he has a rash on his forearm that started 10/16/12 and has gotten larger even though he has been using steroid cream on the site. He is concerned and does not want to wait until his visit week. Advised him he can also get his labs today since he will be here anyway.

## 2012-10-20 ENCOUNTER — Other Ambulatory Visit: Payer: Medicaid Other

## 2012-10-20 LAB — T-HELPER CELL (CD4) - (RCID CLINIC ONLY): CD4 T Cell Abs: 200 uL — ABNORMAL LOW (ref 400–2700)

## 2012-10-20 NOTE — Progress Notes (Signed)
Patient ID: Patrick [name], male   DOB: 03/20/1961, 52 y.o.   MRN: [id number]     [facility] for Infectious Disease  Patient Active Problem List  Diagnosis  . HIV DISEASE  . GOUT, UNSPECIFIED  . HYPERTENSION  . SECONDARY HYPERPARATHYROIDISM  . NEPHROLITHIASIS  . LYMPHADENOPATHY, DIFFUSE  . Hypogonadism male  . Vitamin B12 deficiency  . Diabetes, Type II  . Renal hematoma, right  . Anemia  . Chronic kidney disease  . Bronchitis  . Rash    Patient's Medications  New Prescriptions   No medications on file  Previous Medications   ABACAVIR (ZIAGEN) 300 MG TABLET    Take 2 tablets (600 mg total) by mouth daily.   ACETAMINOPHEN (TYLENOL) 325 MG TABLET    Take 2 tablets (650 mg total) by mouth every 6 (six) hours as needed (or Fever >/= 101).   ATAZANAVIR (REYATAZ) 300 MG CAPSULE    Take 1 capsule (300 mg total) by mouth daily with breakfast.   FERROUS SULFATE 325 (65 FE) MG TABLET    Take 1 tablet (325 mg total) by mouth 3 (three) times daily.   LABETALOL (NORMODYNE) 200 MG TABLET    Take 1 tablet (200 mg total) by mouth 2 (two) times daily.   LAMIVUDINE (EPIVIR) 150 MG TABLET    Take 1 tablet (150 mg total) by mouth daily.   PROAIR HFA 108 (90 BASE) MCG/ACT INHALER    INHALE 2 PUFFS INTO THE LUNGS EVERY 4 HOURS AS NEEDED FOR WHEEZING   RITONAVIR (NORVIR) 100 MG TABS    Take 1 tablet (100 mg total) by mouth daily.  Modified Medications   No medications on file  Discontinued Medications   BENZONATATE (TESSALON PERLES) 100 MG CAPSULE    Take 1 capsule (100 mg total) by mouth 3 (three) times daily as needed for cough.    Subjective: Patrick [name] is seen on a work in basis. He has been feeling worse for the past week. He states that he feels like he does when he starting to have bouts of fever and increased lymphadenopathy that has led to multiple hospitalizations in the past year. He started noticing increased swelling of his cervical lymph nodes about 1 week ago. He does not have any  documented fevers but these had some daytime sweats and he's feeling more fatigued. He has not missed any doses of his medications since his last visit.  Objective:    General: He is calm and in good spirits a little worried Skin: He has some red macules on his left arm that are nonpruritic Oral: No oropharyngeal lesions Lymph nodes: Increased rubbery, diffuse cervical lymph nodes since his last visit Lungs: Clear Cor: Regular S1 and S2 with no murmurs Abdomen: Obese, soft and nontender  Lab Results HIV 1 RNA Quant (copies/mL)  Date Value  06/06/2012 <20   05/05/2012 <20   01/05/2012 <20      CD4 T Cell Abs (cmm)  Date Value  06/06/2012 180*  05/05/2012 370*  01/05/2012 210*     Assessment: They're still no known reason for Patrick [name] is relapsing illness consisting of generalized adenopathy, primarily cervical, often associated with high fever. I will check his CD4 count and viral load and also check HHV 8 DNA PCR. I wonder if he has a variant of relapsing Castleman's disease.  Plan: 1. Continue current antiretroviral regimen 2. Check CD4, HIV viral load and HHV 8 DNA PCR 3. Discuss situation with Patrick [name] Patrick [name], [facility] hematologist  4. Followup in one week   Cliffton Asters, MD [facility] for Infectious Disease [facility] [phone] pager   [phone] cell 10/20/2012, 1:09 PM

## 2012-10-21 ENCOUNTER — Encounter (INDEPENDENT_AMBULATORY_CARE_PROVIDER_SITE_OTHER): Payer: Self-pay

## 2012-10-21 LAB — HIV-1 RNA QUANT-NO REFLEX-BLD: HIV 1 RNA Quant: <20 copies/mL (ref <20)

## 2012-10-25 ENCOUNTER — Encounter: Payer: Self-pay | Admitting: Internal Medicine

## 2012-10-25 ENCOUNTER — Ambulatory Visit (INDEPENDENT_AMBULATORY_CARE_PROVIDER_SITE_OTHER): Payer: Medicaid Other | Admitting: Internal Medicine

## 2012-10-25 VITALS — BP 120/73 | HR 85 | Temp 98.0°F | Ht 72.0 in | Wt 232.5 lb

## 2012-10-25 DIAGNOSIS — R599 Enlarged lymph nodes, unspecified: Secondary | ICD-10-CM

## 2012-10-25 DIAGNOSIS — R161 Splenomegaly, not elsewhere classified: Secondary | ICD-10-CM | POA: Insufficient documentation

## 2012-10-25 DIAGNOSIS — B009 Herpesviral infection, unspecified: Secondary | ICD-10-CM

## 2012-10-25 DIAGNOSIS — B001 Herpesviral vesicular dermatitis: Secondary | ICD-10-CM | POA: Insufficient documentation

## 2012-10-25 MED ORDER — VALACYCLOVIR HCL 500 MG PO TABS: 500.0000 mg | ORAL_TABLET | Freq: Two times a day (BID) | ORAL | Status: AC

## 2012-10-25 NOTE — Progress Notes (Addendum)
Patient ID: QUENTON RECENDEZ, male   DOB: June 27, 1961, 52 y.o.   MRN: [id number]     [facility] for Infectious Disease  Patient Active Problem List  Diagnosis  . HIV DISEASE  . GOUT, UNSPECIFIED  . HYPERTENSION  . SECONDARY HYPERPARATHYROIDISM  . NEPHROLITHIASIS  . LYMPHADENOPATHY, DIFFUSE  . Hypogonadism male  . Vitamin B12 deficiency  . Diabetes, Type II  . Renal hematoma, right  . Anemia  . Chronic kidney disease  . Bronchitis  . Rash  . Herpes labialis    Patient's Medications  New Prescriptions   VALACYCLOVIR (VALTREX) 500 MG TABLET    Take 1 tablet (500 mg total) by mouth 2 (two) times daily.  Previous Medications   ABACAVIR (ZIAGEN) 300 MG TABLET    Take 2 tablets (600 mg total) by mouth daily.   ACETAMINOPHEN (TYLENOL) 325 MG TABLET    Take 2 tablets (650 mg total) by mouth every 6 (six) hours as needed (or Fever >/= 101).   ATAZANAVIR (REYATAZ) 300 MG CAPSULE    Take 1 capsule (300 mg total) by mouth daily with breakfast.   FERROUS SULFATE 325 (65 FE) MG TABLET    Take 1 tablet (325 mg total) by mouth 3 (three) times daily.   LABETALOL (NORMODYNE) 200 MG TABLET    Take 1 tablet (200 mg total) by mouth 2 (two) times daily.   LAMIVUDINE (EPIVIR) 150 MG TABLET    Take 1 tablet (150 mg total) by mouth daily.   PROAIR HFA 108 (90 BASE) MCG/ACT INHALER    INHALE 2 PUFFS INTO THE LUNGS EVERY 4 HOURS AS NEEDED FOR WHEEZING   RITONAVIR (NORVIR) 100 MG TABS    Take 1 tablet (100 mg total) by mouth daily.  Modified Medications   No medications on file  Discontinued Medications   No medications on file    Subjective: Devone is in for his followup visit. He continues to be bothered by a tender cervical adenopathy and subjective fevers. He has not missed any of his medications. He been bothered by herpes fever blisters for the past few weeks.  Objective: Temp: 98 F (36.7 C) (01/28 0920) Temp src: Oral (01/28 0920) BP: 120/73 mmHg (01/28 0920) Pulse Rate: 85  (01/28  0920)  General: He is alert and in no distress other than being somewhat dejected Skin: To fever blisters on his upper lip Oral clear Nodes: Tender generalized adenopathy, especially in the anterior and posterior cervical chains. 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 I am awaiting the HHV 8 PCR results. I placed a call to Dr. Stark Klein in the [facility] hematology department. He has evaluated Reita Cliche on one occasion and I want to discuss diagnostic and therapeutic options with Dr. Quintella Baton. I will continue his current antiretroviral regimen.  Plan: 1. Continue current medications 2. Start Valtrex 3. Await results of HHV 8 PCR and discussions with Dr. Arlana Pouch, MD [facility] for Infectious Disease [facility] [phone] pager   [phone] cell  10/25/2012, 9:42 AM    Addendum: I spoke with Dr. Stark Klein this morning. He informed me that when Einer was seen at [facility] last fall he was positive for HHV 8. Pathologists there reviewed slides from his lymph node and bone marrow biopsies. Apparently they could not confirm Castleman's disease. All findings were felt to be compatible with reactive changes due to her HIV infection. Serum protein electrophoresis did show a monoclonal spike however. Dr. Quintella Baton felt that relapsing Castleman's disease, POEMS, and hemophagocytic lymphohistiocytosis (HLH) are all still considerations.  Repeat HHV 8 PCR is pending from last week's blood work here. I will also ask Nicklas to come back in and obtain blood for interleukin-6 and soluble  interleukin-2 which have been associated with Castleman's disease. I will also check the vascular endothelial growth factor (VEGF) which has been associated with POEMS, and a ferritin level which would be elevated with HLH. Liver enzymes obtained last week were not elevated.  Dr. Quintella Baton will also arrange followup at [facility] within the next 2-3 weeks.  Cliffton Asters, MD [facility] for Infectious Disease [facility] [phone] pager   [phone] cell 10/25/2012, 12:44 PM

## 2012-10-25 NOTE — Addendum Note (Signed)
Addended by: Cliffton Asters on: 10/25/2012 12:49 PM   Modules accepted: Orders

## 2012-10-27 ENCOUNTER — Other Ambulatory Visit (INDEPENDENT_AMBULATORY_CARE_PROVIDER_SITE_OTHER): Payer: Medicaid Other

## 2012-10-27 ENCOUNTER — Other Ambulatory Visit: Payer: Self-pay | Admitting: Internal Medicine

## 2012-10-27 DIAGNOSIS — R599 Enlarged lymph nodes, unspecified: Secondary | ICD-10-CM

## 2012-10-27 DIAGNOSIS — B20 Human immunodeficiency virus [HIV] disease: Secondary | ICD-10-CM

## 2012-11-08 ENCOUNTER — Telehealth: Payer: Self-pay

## 2012-11-08 NOTE — Telephone Encounter (Signed)
Pt calling for lab results.

## 2012-11-08 NOTE — Telephone Encounter (Signed)
Notified pt that Dr. Orvan Falconer would be contacting Dr. Quintella Baton to discuss results.

## 2012-11-08 NOTE — Telephone Encounter (Signed)
K. Green provided "hard copies" of results.  Herpesvirus 8 IgG antibody, IFA >= 1:320  NL <1:20.  Vascular Endothelial Growth, ABN = 246 H, NL range 31-86.  Other two tests WNL.  "Hard copies" placed in MD box.

## 2012-11-08 NOTE — Telephone Encounter (Signed)
Please let Patrick [name] know that I am still waiting on the results. Also, please check with Patrick [name] in the lab about the results for IL-6, soluble IL-2, HHV 8 DNA PCR,  and the vascular endothelial growth factor that were ordered on January 30. I see one test result for HHV 8 antibodies but that is the wrong test.

## 2012-11-16 ENCOUNTER — Telehealth: Payer: Self-pay

## 2012-11-16 ENCOUNTER — Telehealth: Payer: Self-pay | Admitting: *Deleted

## 2012-11-16 NOTE — Telephone Encounter (Signed)
Patient stated Medicaid thought he was living out of state, when he is not, he is going to try and straighten out with them - he did send them documents that would prove residency - said he would call them today  - advised him to call Randolm Idol to see if she could help get temp supply of meds faster than the usual 2-2-1/2 week period of ADAP - explained to patient cannot do emergency ADAP unless patient is pregnant or CD4 and viral loads off the charts. I sounds like he could keep the Medicaid if he can straighten the situation out with them. Told him I would do the ADAP, if he could not get Medicaid straightened out.

## 2012-11-16 NOTE — Telephone Encounter (Signed)
Pt found out he lost Medicaid this week.  Has enough medication for the rest of this week.  Transferred call to B. Hammond to set up appt for "emergency" coverage of medication while ADAP application is being processed.

## 2012-11-23 NOTE — Progress Notes (Signed)
Received office notes from Dr. Stark Klein @ [facility]; forwarded to Dr. Gaylyn Rong.

## 2012-11-30 ENCOUNTER — Encounter: Payer: Self-pay | Admitting: Internal Medicine

## 2012-12-06 ENCOUNTER — Ambulatory Visit (INDEPENDENT_AMBULATORY_CARE_PROVIDER_SITE_OTHER): Payer: Medicaid Other | Admitting: Internal Medicine

## 2012-12-06 VITALS — BP 160/78 | HR 96 | Temp 98.2°F | Ht 72.0 in | Wt 230.0 lb

## 2012-12-06 NOTE — Progress Notes (Signed)
Patient ID: Patrick [name], male   DOB: 01/29/1961, 52 y.o.   MRN: [id number]          [facility] for Infectious Disease  Patient Active Problem List  Diagnosis  . HIV DISEASE  . GOUT, UNSPECIFIED  . HYPERTENSION  . SECONDARY HYPERPARATHYROIDISM  . NEPHROLITHIASIS  . LYMPHADENOPATHY, DIFFUSE  . Hypogonadism male  . Vitamin B12 deficiency  . Diabetes, Type II  . Renal hematoma, right  . Anemia  . Chronic kidney disease  . Rash  . Herpes labialis  . Splenomegaly    Patient's Medications  New Prescriptions   No medications on file  Previous Medications   ABACAVIR (ZIAGEN) 300 MG TABLET    Take 2 tablets (600 mg total) by mouth daily.   ACETAMINOPHEN (TYLENOL) 325 MG TABLET    Take 2 tablets (650 mg total) by mouth every 6 (six) hours as needed (or Fever >/= 101).   ATAZANAVIR (REYATAZ) 300 MG CAPSULE    Take 1 capsule (300 mg total) by mouth daily with breakfast.   FERROUS SULFATE 325 (65 FE) MG TABLET    Take 1 tablet (325 mg total) by mouth 3 (three) times daily.   LABETALOL (NORMODYNE) 200 MG TABLET    Take 1 tablet (200 mg total) by mouth 2 (two) times daily.   LAMIVUDINE (EPIVIR) 150 MG TABLET    Take 1 tablet (150 mg total) by mouth daily.   PROAIR HFA 108 (90 BASE) MCG/ACT INHALER    INHALE 2 PUFFS INTO THE LUNGS EVERY 4 HOURS AS NEEDED FOR WHEEZING   RITONAVIR (NORVIR) 100 MG TABS    Take 1 tablet (100 mg total) by mouth daily.   VALACYCLOVIR (VALTREX) 500 MG TABLET    Take 1 tablet (500 mg total) by mouth 2 (two) times daily.  Modified Medications   No medications on file  Discontinued Medications   No medications on file    Subjective: Patrick [name] is in for his routine visit. He has recently seen Dr. Quintella [name] again and was referred to [facility] neurology and has seen Dr. Vickki [name] twice recently. 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 He now has a full, steady supply and has not missed any other doses. He has been bothered by some mild dry cough recently. He does not have any fever or shortness of breath. He feels like his cervical adenopathy may be getting worse again.  Objective: Temp: 98.2 F (36.8 C) (03/11 0926) Temp src: Oral (03/11 0926) BP: 160/78 mmHg (03/11 0926) Pulse Rate: 96 (03/11 0926)  General: he looks fatigued Skin: no rash Lungs: clear Cor: regular S1 and S2 no murmurs Abdomen: soft and nontender Soft, rubbery cervical adenopathy is decreased since his last visit  Lab Results HIV 1 RNA Quant (copies/mL)  Date Value  10/19/2012 <20   06/06/2012 <20   05/05/2012 <20      CD4 T Cell Abs (cmm)  Date Value  10/19/2012 200*  06/06/2012 180*  05/05/2012 370*     Assessment: His HIV infection has been under good control with his current regimen. His CD4 reconstitution has stalled around 200. It is still not entirely clear her what to do about his adenopathy. That is currently under evaluation at [facility].  Plan: 1. Continue current medications 2. Treat mild bronchitis symptomatically  3. Followup here after lab work in 6 weeks   Cliffton Asters, MD [facility] for Infectious Disease [facility] [phone] pager   [phone] cell 12/06/2012, 10:01 AM

## 2012-12-07 ENCOUNTER — Ambulatory Visit (INDEPENDENT_AMBULATORY_CARE_PROVIDER_SITE_OTHER): Payer: Medicaid Other | Admitting: Internal Medicine

## 2012-12-07 ENCOUNTER — Telehealth: Payer: Self-pay | Admitting: *Deleted

## 2012-12-07 ENCOUNTER — Ambulatory Visit: Payer: Medicaid Other | Admitting: Internal Medicine

## 2012-12-07 ENCOUNTER — Encounter: Payer: Self-pay | Admitting: Internal Medicine

## 2012-12-07 VITALS — BP 105/66 | HR 88 | Temp 98.7°F | Ht 71.0 in | Wt 234.5 lb

## 2012-12-07 VITALS — BP 130/79 | HR 99 | Temp 101.2°F | Wt 235.7 lb

## 2012-12-07 DIAGNOSIS — L509 Urticaria, unspecified: Secondary | ICD-10-CM

## 2012-12-07 DIAGNOSIS — F411 Generalized anxiety disorder: Secondary | ICD-10-CM

## 2012-12-07 MED ORDER — PREDNISONE 10 MG PO TABS: ORAL_TABLET | ORAL | Status: DC

## 2012-12-07 MED ORDER — PAROXETINE HCL 20 MG PO TABS: 20.0000 mg | ORAL_TABLET | Freq: Every day | ORAL | Status: DC

## 2012-12-07 MED ORDER — ALPRAZOLAM 0.25 MG PO TABS: 0.2500 mg | ORAL_TABLET | Freq: Two times a day (BID) | ORAL | Status: DC | PRN

## 2012-12-07 NOTE — Progress Notes (Signed)
Patient ID: Patrick [name], male   DOB: 04-03-1961, 53 y.o.   MRN: [id number]          [facility] for Infectious Disease  Patient Active Problem List  Diagnosis  . HIV DISEASE  . GOUT, UNSPECIFIED  . HYPERTENSION  . SECONDARY HYPERPARATHYROIDISM  . NEPHROLITHIASIS  . LYMPHADENOPATHY, DIFFUSE  . Hypogonadism male  . Vitamin B12 deficiency  . Diabetes, Type II  . Renal hematoma, right  . Anemia  . Chronic kidney disease  . Rash  . Herpes labialis  . Splenomegaly  . Hives    Patient's Medications  New Prescriptions   PREDNISONE (DELTASONE) 10 MG TABLET    Take 4 tablets by mouth daily for 2 days, then 2 tablets by mouth daily for 2 days, then 1 tablet by mouth for 2 days  Previous Medications   ABACAVIR (ZIAGEN) 300 MG TABLET    Take 2 tablets (600 mg total) by mouth daily.   ACETAMINOPHEN (TYLENOL) 325 MG TABLET    Take 2 tablets (650 mg total) by mouth every 6 (six) hours as needed (or Fever >/= 101).   ALPRAZOLAM (XANAX) 0.25 MG TABLET    Take 1 tablet (0.25 mg total) by mouth 2 (two) times daily as needed for anxiety.   ATAZANAVIR (REYATAZ) 300 MG CAPSULE    Take 1 capsule (300 mg total) by mouth daily with breakfast.   FERROUS SULFATE 325 (65 FE) MG TABLET    Take 1 tablet (325 mg total) by mouth 3 (three) times daily.   LABETALOL (NORMODYNE) 200 MG TABLET    Take 1 tablet (200 mg total) by mouth 2 (two) times daily.   LAMIVUDINE (EPIVIR) 150 MG TABLET    Take 1 tablet (150 mg total) by mouth daily.   PAROXETINE (PAXIL) 20 MG TABLET    Take 1 tablet (20 mg total) by mouth daily.   PROAIR HFA 108 (90 BASE) MCG/ACT INHALER    INHALE 2 PUFFS INTO THE LUNGS EVERY 4 HOURS AS NEEDED FOR WHEEZING   RITONAVIR (NORVIR) 100 MG TABS    Take 1 tablet (100 mg total) by mouth daily.   VALACYCLOVIR (VALTREX) 500 MG TABLET    Take 1 tablet (500 mg total) by mouth 2 (two) times daily.  Modified Medications   No medications on file  Discontinued Medications   No medications on  file    Subjective: Patrick [name] is seen on an urgent, work in basis. Over the past 24 hours he is developed more subjective fevers and sweating. He still feels like his cervical adenopathy is worse than normal. He was seen in the internal medicine clinic this morning by Dr. Dorise Hiss for very faint generalized rash. Since leaving her office his rash has gotten much worse and intensely pruritic. He has not taken any new medications recently prior to onset of the rash.  Objective: Temp: 98.7 F (37.1 C) (03/12 1411) Temp src: Oral (03/12 1411) BP: 105/66 mmHg (03/12 1411) Pulse Rate: 88 (03/12 1411)  General: he is diaphoretic Skin: generalized patchy urticaria on his arms, left palm, trunk and buttocks. He also has some swelling of his lips Oral: No oropharyngeal lesions Eyes: External exam normal Lymph nodes: Large rubbery cervical adenopathy unchanged Lungs: clear Cor: regular S1 and S2 no murmurs Abdomen: obese, soft and nontender Joints and extremities: Normal  Lab Results HIV 1 RNA Quant (copies/mL)  Date Value  10/19/2012 <20   06/06/2012 <20   05/05/2012 <20  CD4 T Cell Abs (cmm)  Date Value  10/19/2012 200*  06/06/2012 180*  05/05/2012 370*     Assessment: Patrick [name] has developed acute urticaria of unknown cause. He did not respond to oral diphenhydramine at home. I'm not sure if this is related to his HHV 8 co-infection. There variety of skin disorders associated with syndrome such as Castleman's or POEMs but I do not believe they are urticarial in nature. Given the rapid evolution and lip swelling I will give him a brief prednisone taper.  Plan: 1. Prednisone taper starting at 40 mg and tapering over 6 days 2. Continue current, chronic medications 3. Followup on March 17   Cliffton Asters, MD [facility] for Infectious Disease [facility] [phone] pager   [phone] cell 12/07/2012, 2:46 PM

## 2012-12-07 NOTE — Progress Notes (Signed)
Subjective:     Patient ID: Patrick [name], male   DOB: Jun 08, 1961, 52 y.o.   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 He comes in today for an acute visit for a rash. He states that last night he was having extreme itching with scratching and did develop large welts over his arms and lower thigh region. It seems like most of the rash was centered around areas where clothing was rubbing. This morning he does state that he is also worried about some anxiety which she's been having for many years however he is always wanted to avoid medication. He's realized that even small things such as driving around town can cause him to have some anxiety and mental anguish. He states he is ready to take something for his condition. He states that sometimes this anxiety does affect his sleep and certainly affects his mood on a daily basis. He has no other complaints at today's visit and continues to take his medications as prescribed. He states that his lymphadenopathy is slightly better in some locations and slightly worse in other locations. He continues to follow at [facility] and [facility].   Review of Systems  Constitutional: Positive for fever. Negative for chills, diaphoresis, activity change, appetite change, fatigue and unexpected weight change.  HENT: Negative for hearing loss, ear pain, sore throat, facial swelling, drooling, trouble swallowing, neck pain, neck stiffness and ear discharge.   Eyes: Negative.   Respiratory: Negative for cough, choking, chest tightness, shortness of breath and wheezing.   Cardiovascular: Negative for chest pain, palpitations and leg swelling.  Gastrointestinal: Negative for nausea, vomiting, abdominal pain, diarrhea, constipation and abdominal distention.  Musculoskeletal: Negative for  myalgias, back pain, joint swelling, arthralgias and gait problem.  Skin: Positive for rash. Negative for color change, pallor and wound.  Allergic/Immunologic: Negative.   Neurological: Negative.   Hematological: Negative.   Psychiatric/Behavioral: Positive for agitation. Negative for suicidal ideas, hallucinations, behavioral problems, confusion, sleep disturbance, self-injury, dysphoric mood and decreased concentration. The patient is nervous/anxious. The patient is not hyperactive.        Very mild agitation.       Objective:   Physical Exam  Constitutional: He is oriented to person, place, and time. He appears well-developed and well-nourished. No distress.  HENT:  Head: Normocephalic and atraumatic.  Eyes: EOM are normal. Pupils are equal, round, and reactive to light.  Neck: Normal range of motion. Neck supple. No JVD present. No tracheal deviation present. No thyromegaly present.  Seems stable from previous visits.   Cardiovascular: Normal rate and normal heart sounds.   Pulmonary/Chest: Effort normal and breath sounds normal. No respiratory distress. He has no wheezes. He has no rales. He exhibits no tenderness.  Abdominal: Soft. Bowel sounds are normal. He exhibits no distension. There is no tenderness. There is no rebound and no guarding.  Musculoskeletal: Normal range of motion. He exhibits no edema and no tenderness.  Lymphadenopathy:    He has cervical adenopathy.  Neurological: He is alert and oriented to person, place, and time. No cranial nerve deficit.  Skin: Skin is warm and dry. Rash noted. He is not diaphoretic. No erythema. No pallor.  Several small (<55mm) circumscribed areas of redness that appear to be ruptured capillaries from scratching.       Assessment/Plan:   1. Rash-the  patient does not have impressive rash on exam and it seems that Benadryl and sleep without scratching did relieve this rash. Will advise him that he can use Benadryl cream or Benadryl by  mouth or steroid cream that he has at home if the rash flares up again.  2. Febrile-the patient is running fever at today's visit however this is common for him and they have not yet determined the exact etiology of his fevers however no acute treatment is indicated at this time. The patient does not have any focal source or site of infection at this time. Will continue to follow.  3. Anxiety-the patient does have symptoms that sound consistent with generalized anxiety disorder and he will be started on paroxetine 20 mg daily. He will receive a small supply of Xanax to be used when necessary until this medicine can build up in his system. Prescribe Xanax 0.25 mg to be taken one pill 2 times a day when necessary. Noted interaction with some of his other medications that may make this medicine more potent. Advised him to take this when he does not have anything to do at the first onset. Advised the medication is too strong to decrease to one half pill twice a day when necessary.  4. Disposition-the patient will be seen back in 4-6 weeks to determine the efficacy of paroxetine. Will advise use of Benadryl cream or Benadryl by mouth over-the-counter. Advised patient if the rash worsens he is to call our office back or seek medical attention. Will not provide refills on xanax.

## 2012-12-07 NOTE — Patient Instructions (Signed)
We will have you start paroxetine (or paxil) for the mood to help stabilize it. Take 1 pill in the morning every day. The full effects may take 4-6 weeks. We will also give you a small dose of xanax that you can take up to 2 times per day (no more than 1 pill at a time). If you feel too drowsy after 1 xanax use 1/2 pill.   We will see you back in 4-6 weeks to check on your mood and check your diabetes.    Social Anxiety Disorder Almost everyone can feel some degree of discomfort in a given social situation. However, when you feel extreme fear of social encounters and it begins to interfere with your daily functioning, you have social anxiety disorder. There are two types of this disorder.  If you have the first type, you are extremely anxious in only a few specific situations. For instance, you become anxious when answering a question out loud in class or presenting at work.  If you have the second type, you experience overwhelming worry in most or all social experiences. This may include everything from going to a doctor's appointment, to eating in a restaurant, to entering a crowded room. When this disorder happens in very young children, it may be from a new babysitter or stranger that the child is not used to. In the very young it may show up as crying, tantrums, or withdrawal.  Most adults with social anxiety were shy and timid as children. If left untreated, these adults may appear quiet and passive in social situations. They may be highly sensitive to the criticism and disapproval of others. They may have no close friends outside of first degree relatives. They may be fearful of saying or doing something foolish or becoming emotional in front of others. As a result of these fears, they may avoid most social encounters and select jobs and personal activities that allow them to isolate themselves from others.  CAUSES  This disorder can result from the combination of several factors.   Your  genetic makeup affects how sensitive you are and how much stress you can tolerate.  How you were raised as a child also plays a part. Research suggests that children raised with overprotective parents, excessive expectations, overly critical parents, low assertiveness, and/or emotional insecurity have increased feelings of anxiety.  A traumatic life event can also contribute to social anxiety; for example, being pointed out and shamed in public or being repeatedly bullied. SYMPTOMS  This disorder is characterized by a fear of social situations. The anxiety is marked by:  Apprehension.  Nervousness.  A feeling of unease, worry, or tension. Anxiety may also be reflected in:  Blushing.  Restlessness.  Trembling.  Shortness of breath.  Sweating.  Muscle tics and twitches. At higher levels of anxiety, there also may be:  Increased heart rate.  A rise in blood pressure.  Rapid breathing.  Muscle tension. Experiencing these uncomfortable symptoms often enough can cause a person to avoid social situations. This can cause many problems in the anxiety sufferer's life.  TREATMENT  There are many types of treatment available for social anxiety disorder.  Group therapy allows you to see that you are not alone with these problems.  Individual therapy helps you address anxiety issues with a caring professional.  Relaxation techniques may be used as tools to help you overcome fear.  Hypnosis may help change the way you think about social settings.  Numerous medications are available that your  caregiver can prescribe to help during a difficult time. Medications can be used for a brief period of time. The goal of this treatment is to help recondition you so that once you quit taking the medications, your anxiety will not return. PROGNOSIS  Social anxiety disorder is a common problem that is very treatable. Individuals who participate in treatment have a very high success rate. When  treated properly, the prognosis is very good to excellent. Document Released: 08/13/2005 Document Revised: 12/07/2011 Document Reviewed: 08/09/2007 [facility] Patient Information 2013 [place], [place].  Paroxetine tablets What is this medicine? PAROXETINE (pa ROX e teen) is used to treat depression. It may also be used to treat anxiety disorders, obsessive compulsive disorder, panic attacks, post traumatic stress, and premenstrual dysphoric disorder (PMDD). This medicine may be used for other purposes; ask your health care provider or pharmacist if you have questions. What should I tell my health care provider before I take this medicine? They need to know if you have any of these conditions: -bipolar disorder or a family history of bipolar disorder -heart disease -kidney or liver disease -receiving electroconvulsive therapy -seizures (convulsions) -suicidal thoughts or a previous suicide attempt -an unusual or allergic reaction to paroxetine, other medicines, foods, dyes, or preservatives -pregnant or trying to get pregnant -breast-feeding How should I use this medicine? Take this medicine by mouth with a glass of water. Follow the directions on the prescription label. You can take it with or without food. Take your medicine at regular intervals. Do not take your medicine more often than directed. Do not stop taking this medicine suddenly except upon the advice of your doctor. Stopping this medicine too quickly may cause serious side effects or your condition may worsen. A special MedGuide will be given to you by the pharmacist with each prescription and refill. Be sure to read this information carefully each time. Talk to your pediatrician regarding the use of this medicine in children. Special care may be needed. Overdosage: If you think you have taken too much of this medicine contact a poison control center or emergency room at once. NOTE: This medicine is only for you. Do not share this  medicine with others. What if I miss a dose? If you miss a dose, take it as soon as you can. If it is almost time for your next dose, take only that dose. Do not take double or extra doses. What should I watch for while using this medicine? Tell your doctor if your symptoms do not get better or if they get worse. Visit your doctor or health care professional for regular checks on your progress. Because it may take several weeks to see the full effects of this medicine, it is important to continue your treatment as prescribed by your doctor. Patients and their families should watch out for new or worsening thoughts of suicide or depression. Also watch out for sudden changes in feelings such as feeling anxious, agitated, panicky, irritable, hostile, aggressive, impulsive, severely restless, overly excited and hyperactive, or not being able to sleep. If this happens, especially at the beginning of treatment or after a change in dose, call your health care professional. Bonita Quin may get drowsy or dizzy. Do not drive, use machinery, or do anything that needs mental alertness until you know how this medicine affects you. Do not stand or sit up quickly, especially if you are an older patient. This reduces the risk of dizzy or fainting spells. Alcohol may interfere with the effect of this medicine.  Avoid alcoholic drinks. Your mouth may get dry. Chewing sugarless gum or sucking hard candy, and drinking plenty of water will help. Contact your doctor if the problem does not go away or is severe. What side effects may I notice from receiving this medicine? Side effects that you should report to your doctor or health care professional as soon as possible: -allergic reactions like skin rash, itching or hives, swelling of the face, lips, or tongue -black or bloody stools, blood in the urine or vomit -fast, irregular heartbeat -hallucination, loss of contact with reality -painful or prolonged erection  (men) -seizures -suicidal thoughts or other mood changes -trouble passing urine or change in the amount of urine -unusual bleeding or bruising -unusually weak or tired -vomiting Side effects that usually do not require medical attention (report to your doctor or health care professional if they continue or are bothersome): -change in appetite, weight -change in sex drive or performance -constipation or diarrhea -difficulty sleeping -drowsy -headache -increased sweating -muscle pain or weakness -tremors This list may not describe all possible side effects. Call your doctor for medical advice about side effects. You may report side effects to FDA at 1-800-FDA-1088. Where should I keep my medicine? Keep out of the reach of children. Store at room temperature between 15 and 30 degrees C (59 and 86 degrees F). Keep container tightly closed. Throw away any unused medicine after the expiration date. NOTE: This sheet is a summary. It may not cover all possible information. If you have questions about this medicine, talk to your doctor, pharmacist, or health care provider.  2013, [employer]. (01/29/2012 8:57:16 PM)

## 2012-12-07 NOTE — Telephone Encounter (Signed)
Patient called and advised that his rash has gotten worse. He advised that his neck is very red and itchy and his lips are swollen and he has already taken benadryl. He advised he feels worse than he did yesterday. After speaking with Dr Orvan Falconer advised patient to come to clinic rather than ED.

## 2012-12-09 ENCOUNTER — Emergency Department (HOSPITAL_COMMUNITY)
Admission: EM | Admit: 2012-12-09 | Discharge: 2012-12-10 | Disposition: A | Discharge status: Home / Self Care | Payer: Medicaid Other | Attending: Emergency Medicine | Admitting: Emergency Medicine

## 2012-12-09 ENCOUNTER — Encounter (HOSPITAL_COMMUNITY): Payer: Self-pay | Admitting: *Deleted

## 2012-12-09 ENCOUNTER — Other Ambulatory Visit: Payer: Self-pay | Admitting: Internal Medicine

## 2012-12-09 DIAGNOSIS — L509 Urticaria, unspecified: Secondary | ICD-10-CM | POA: Insufficient documentation

## 2012-12-09 DIAGNOSIS — IMO0002 Reserved for concepts with insufficient information to code with codable children: Secondary | ICD-10-CM | POA: Insufficient documentation

## 2012-12-09 DIAGNOSIS — Z79899 Other long term (current) drug therapy: Secondary | ICD-10-CM | POA: Insufficient documentation

## 2012-12-09 DIAGNOSIS — D649 Anemia, unspecified: Secondary | ICD-10-CM | POA: Insufficient documentation

## 2012-12-09 DIAGNOSIS — Z21 Asymptomatic human immunodeficiency virus [HIV] infection status: Secondary | ICD-10-CM | POA: Insufficient documentation

## 2012-12-09 DIAGNOSIS — R21 Rash and other nonspecific skin eruption: Secondary | ICD-10-CM | POA: Insufficient documentation

## 2012-12-09 DIAGNOSIS — T4995XA Adverse effect of unspecified topical agent, initial encounter: Secondary | ICD-10-CM | POA: Insufficient documentation

## 2012-12-09 DIAGNOSIS — I129 Hypertensive chronic kidney disease with stage 1 through stage 4 chronic kidney disease, or unspecified chronic kidney disease: Secondary | ICD-10-CM | POA: Insufficient documentation

## 2012-12-09 DIAGNOSIS — Z87442 Personal history of urinary calculi: Secondary | ICD-10-CM | POA: Insufficient documentation

## 2012-12-09 DIAGNOSIS — R011 Cardiac murmur, unspecified: Secondary | ICD-10-CM | POA: Insufficient documentation

## 2012-12-09 DIAGNOSIS — Z8719 Personal history of other diseases of the digestive system: Secondary | ICD-10-CM | POA: Insufficient documentation

## 2012-12-09 DIAGNOSIS — L299 Pruritus, unspecified: Secondary | ICD-10-CM | POA: Insufficient documentation

## 2012-12-09 DIAGNOSIS — N183 Chronic kidney disease, stage 3 unspecified: Secondary | ICD-10-CM | POA: Insufficient documentation

## 2012-12-09 MED ORDER — PREDNISONE 10 MG PO TABS: ORAL_TABLET | ORAL | Status: DC

## 2012-12-09 MED ORDER — RANITIDINE HCL 150 MG PO TABS: 150.0000 mg | ORAL_TABLET | Freq: Two times a day (BID) | ORAL | Status: DC

## 2012-12-09 MED ORDER — HYDROXYZINE HCL 25 MG PO TABS
25.0000 mg | ORAL_TABLET | Freq: Once | ORAL | Status: AC
  Administered 2012-12-09: 25 mg via ORAL
  Filled 2012-12-09: qty 1

## 2012-12-09 MED ORDER — FAMOTIDINE 20 MG PO TABS
20.0000 mg | ORAL_TABLET | Freq: Once | ORAL | Status: AC
  Administered 2012-12-09: 20 mg via ORAL
  Filled 2012-12-09: qty 1

## 2012-12-09 NOTE — ED Notes (Signed)
Sx resolved, feels better, urticaria not present at this time.

## 2012-12-09 NOTE — ED Notes (Signed)
No answer for repeat VS and call for exam room.

## 2012-12-09 NOTE — ED Notes (Addendum)
Developed rash and itching on Monday. Seen in [facility]  Downstairs on Wednesday and started prednisone. Seems to be worse today, spreading to face and around eyes. Also on trunk, back, arms neck and legs.  Urticaria noted. Denies airway involvement, but also mentions thought he was developing bronchitis on Monday and used inhaler (albuterol). LS CTA. Throat unremarkable. Has been taking benadryl also. Last took 1 benadryl around 1200 noon.  Acuity 3 d/t hx and facial involvement.

## 2012-12-09 NOTE — Progress Notes (Signed)
Patient called feeling that his urticarial rash is worse, increased in size but not necessarily new areas of involvement. His lips are less swollen in comparison to 3/12 when he started taking prednisone. No shortness of breath, no wheezing, no throat swelling, no lip/tongue swelling. Currently on pred 20, finished 2 days of pred 40  He states that he has nothing new to his daily regimen, did take albuterol inhaler on Monday when he thought he had bronchitis. No new foods, new shampoos/soaps. No new meds  Will extend his pred tape:  pred 40 x 2 days, pred 30 x 2 days (starting on Monday),pred 20 x 2 days, pred 10 x 2 days. Will also give ranitidine 150mg  BID, but asked him to follow instructions due to being on PI. Continue with benadryl. Watch for sedation.  To be seen by Dr. Orvan Falconer on Monday

## 2012-12-10 MED ORDER — EPINEPHRINE 0.3 MG/0.3ML IJ DEVI: 0.3000 mg | INTRAMUSCULAR | Status: DC | PRN

## 2012-12-10 MED ORDER — HYDROXYZINE HCL 25 MG PO TABS: 25.0000 mg | ORAL_TABLET | Freq: Four times a day (QID) | ORAL | Status: DC

## 2012-12-10 MED ORDER — FAMOTIDINE 20 MG PO TABS: 20.0000 mg | ORAL_TABLET | Freq: Two times a day (BID) | ORAL | Status: DC

## 2012-12-10 NOTE — ED Provider Notes (Signed)
History     CSN: [id number]  Arrival date & time 12/09/12  1940   First MD Initiated Contact with Patient 12/10/12 0046      Chief Complaint  Patient presents with  . Urticaria  . Allergic Reaction  . Pruritis   HPI  History provided by the patient. Patient is a 52 year old male with history of hypertension, HIV with last CD4 count in 200s and undetectable viral load who presents with complaints of transient pruritic, diffuse rash for the past several days. Patient was seen on Monday by his PCP for the similar symptoms. He was started on a prednisone Dosepak which had some improvement of symptoms however patient reports having recurrent episodes of breakouts of a paretic sometimes burning rash all over his body and arms occasionally his face. He has also been taking Benadryl for itching symptoms without much improvement or change. Prior to arrival patient began to have increasing rash around the face and eyes and was concerned this may worsen and so he came to emergency room. He denies having any shortness of breath or difficulty breathing. No swelling of the mouth or throat. Patient last took Benadryl around noon. No other treatments prior to arrival. Patient denies any other aggravating or alleviating factors. He does not know of any new soaps, body lotions, cologne, clothing or laundry detergents. He has no prior history of similar rashes.    Past Medical History  Diagnosis Date  . Hypertension   . HIV (human immunodeficiency virus infection) 2010  . Hypogonadism male   . CKD (chronic kidney disease) stage 3, GFR 30-59 ml/min     Baseline Cr = 1.4  . Kidney stones   . Heart murmur   . Anemia   . Hernia   . Pancytopenia with fever 02/26/2012  . LYMPHADENOPATHY, DIFFUSE 11/18/2010  . Gallstones     Past Surgical History  Procedure Laterality Date  . Lithotripsy  2010  . Appendectomy  2001    via midline incision  . Axillary lymph node dissection  2011; 2012    left     Family History  Problem Relation Age of Onset  . Adopted: Yes    History  Substance Use Topics  . Smoking status: Never Smoker   . Smokeless tobacco: Never Used  . Alcohol Use: 0.0 oz/week     Comment: 02/25/12 "couple mixed drinks or beers or glasses of wine/month"      Review of Systems  Constitutional: Negative for fever and chills.  Skin: Positive for rash.  All other systems reviewed and are negative.    Allergies  Codeine; Sulfonamide derivatives; and Dapsone  Home Medications   Current Outpatient Rx  Name  Route  Sig  Dispense  Refill  . abacavir (ZIAGEN) 300 MG tablet   Oral   Take 2 tablets (600 mg total) by mouth daily.   60 tablet   11   . acetaminophen (TYLENOL) 325 MG tablet   Oral   Take 2 tablets (650 mg total) by mouth every 6 (six) hours as needed (or Fever >/= 101).   15 tablet   0   . albuterol (PROVENTIL HFA;VENTOLIN HFA) 108 (90 BASE) MCG/ACT inhaler   Inhalation   Inhale 2 puffs into the lungs every 6 (six) hours as needed for wheezing.         Marland Kitchen ALPRAZolam (XANAX) 0.25 MG tablet   Oral   Take 1 tablet (0.25 mg total) by mouth 2 (two) times daily as needed for  anxiety.   60 tablet   0   . atazanavir (REYATAZ) 300 MG capsule   Oral   Take 1 capsule (300 mg total) by mouth daily with breakfast.   30 capsule   11   . ferrous sulfate 325 (65 FE) MG tablet   Oral   Take 1 tablet (325 mg total) by mouth 3 (three) times daily.   90 tablet   3   . labetalol (NORMODYNE) 200 MG tablet   Oral   Take 1 tablet (200 mg total) by mouth 2 (two) times daily.   60 tablet   6   . lamiVUDine (EPIVIR) 150 MG tablet   Oral   Take 1 tablet (150 mg total) by mouth daily.   30 tablet   11   . PARoxetine (PAXIL) 20 MG tablet   Oral   Take 1 tablet (20 mg total) by mouth daily.   30 tablet   2   . predniSONE (DELTASONE) 10 MG tablet      Take 4 tablets by mouth daily for 2 days, then 3 tabs for 2 days, then 2 tabs for 2 days, then 1  tab for 2 days   20 tablet   0   . ranitidine (ZANTAC) 150 MG tablet   Oral   Take 1 tablet (150 mg total) by mouth 2 (two) times daily.   14 tablet   1   . ritonavir (NORVIR) 100 MG TABS   Oral   Take 1 tablet (100 mg total) by mouth daily.   30 tablet   11   . valACYclovir (VALTREX) 500 MG tablet   Oral   Take 1 tablet (500 mg total) by mouth 2 (two) times daily.   14 tablet   5     BP 130/80  Pulse 85  Temp(Src) 98.8 F (37.1 C) (Oral)  Resp 16  SpO2 97%  Physical Exam  Nursing note and vitals reviewed. Constitutional: He is oriented to person, place, and time. He appears well-developed and well-nourished. No distress.  HENT:  Head: Normocephalic.  Mouth/Throat: Oropharynx is clear and moist.  Neck: Normal range of motion. Neck supple.  Cardiovascular: Normal rate and regular rhythm.   Pulmonary/Chest: Effort normal and breath sounds normal. No stridor.  Abdominal: Soft.  Musculoskeletal: Normal range of motion.  Neurological: He is alert and oriented to person, place, and time.  Skin: Skin is warm and dry.  Very slight remnants of a few erythematous macular rash on extremities.  Psychiatric: He has a normal mood and affect. His behavior is normal.    ED Course  Procedures     1. Urticarial rash       MDM  12:40AM patient seen and evaluated. Patient in no acute distress and reports significant improvements after initial treatments in the emergency room.       Angus Seller, PA-C 12/10/12 [phone]

## 2012-12-10 NOTE — ED Provider Notes (Signed)
Medical screening examination/treatment/procedure(s) were performed by non-physician practitioner and as supervising physician I was immediately available for consultation/collaboration.   Hanley Seamen, MD 12/10/12 [phone]

## 2012-12-12 ENCOUNTER — Encounter: Payer: Self-pay | Admitting: Internal Medicine

## 2012-12-12 ENCOUNTER — Ambulatory Visit (INDEPENDENT_AMBULATORY_CARE_PROVIDER_SITE_OTHER): Payer: Medicaid Other | Admitting: Internal Medicine

## 2012-12-12 VITALS — BP 143/80 | HR 98 | Temp 98.3°F | Ht 71.0 in | Wt 233.0 lb

## 2012-12-12 NOTE — Progress Notes (Signed)
Patient ID: Patrick [name], male   DOB: 24-Feb-1961, 52 y.o.   MRN: [id number]          [facility] for Infectious Disease  Patient Active Problem List  Diagnosis  . HIV DISEASE  . GOUT, UNSPECIFIED  . HYPERTENSION  . SECONDARY HYPERPARATHYROIDISM  . NEPHROLITHIASIS  . LYMPHADENOPATHY, DIFFUSE  . Hypogonadism male  . Vitamin B12 deficiency  . Diabetes, Type II  . Renal hematoma, right  . Anemia  . Chronic kidney disease  . Rash  . Herpes labialis  . Splenomegaly  . Hives    Patient's Medications  New Prescriptions   No medications on file  Previous Medications   ABACAVIR (ZIAGEN) 300 MG TABLET    Take 2 tablets (600 mg total) by mouth daily.   ACETAMINOPHEN (TYLENOL) 325 MG TABLET    Take 2 tablets (650 mg total) by mouth every 6 (six) hours as needed (or Fever >/= 101).   ALBUTEROL (PROVENTIL HFA;VENTOLIN HFA) 108 (90 BASE) MCG/ACT INHALER    Inhale 2 puffs into the lungs every 6 (six) hours as needed for wheezing.   ALPRAZOLAM (XANAX) 0.25 MG TABLET    Take 1 tablet (0.25 mg total) by mouth 2 (two) times daily as needed for anxiety.   ATAZANAVIR (REYATAZ) 300 MG CAPSULE    Take 1 capsule (300 mg total) by mouth daily with breakfast.   FAMOTIDINE (PEPCID) 20 MG TABLET    Take 1 tablet (20 mg total) by mouth 2 (two) times daily.   FERROUS SULFATE 325 (65 FE) MG TABLET    Take 1 tablet (325 mg total) by mouth 3 (three) times daily.   HYDROXYZINE (ATARAX/VISTARIL) 25 MG TABLET    Take 1 tablet (25 mg total) by mouth every 6 (six) hours.   LABETALOL (NORMODYNE) 200 MG TABLET    Take 1 tablet (200 mg total) by mouth 2 (two) times daily.   LAMIVUDINE (EPIVIR) 150 MG TABLET    Take 1 tablet (150 mg total) by mouth daily.   PAROXETINE (PAXIL) 20 MG TABLET    Take 1 tablet (20 mg total) by mouth daily.   RITONAVIR (NORVIR) 100 MG TABS    Take 1 tablet (100 mg total) by mouth daily.   VALACYCLOVIR (VALTREX) 500 MG TABLET    Take 1 tablet (500 mg total) by mouth 2 (two) times  daily.  Modified Medications   No medications on file  Discontinued Medications   EPINEPHRINE (EPIPEN) 0.3 MG/0.3 ML DEVI    Inject 0.3 mLs (0.3 mg total) into the muscle as needed.   PREDNISONE (DELTASONE) 10 MG TABLET    Take 4 tablets by mouth daily for 2 days, then 3 tabs for 2 days, then 2 tabs for 2 days, then 1 tab for 2 days   RANITIDINE (ZANTAC) 150 MG TABLET    Take 1 tablet (150 mg total) by mouth 2 (two) times daily.    Subjective: Patrick [name] is in for his followup visit. After his visit 1 week ago his hives flared even though he was on prednisone. He was seen in the emergency department and the prednisone was continued an H2 blocker was added. His rash has resolved over the past few days. He has not had any further subjective fevers.  Objective: Temp: 98.3 F (36.8 C) (03/17 1412) Temp src: Oral (03/17 1412) BP: 143/80 mmHg (03/17 1412) Pulse Rate: 98 (03/17 1412)  General: he is looking much better and is in no distress Skin: hives have  resolved Lymph nodes: Cervical adenopathy has decreased Lungs: clear Cor: regular S1 and S2 no murmurs Abdomen: obese, soft and nontender  Lab Results HIV 1 RNA Quant (copies/mL)  Date Value  10/19/2012 <20   06/06/2012 <20   05/05/2012 <20      CD4 T Cell Abs (cmm)  Date Value  10/19/2012 200*  06/06/2012 180*  05/05/2012 370*     Assessment: His new onset hives have responded to prednisone and H2 blockers. He will stop prednisone today. He knows to call me if he has any recurrence. I am not sure what caused the hives. 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  Plan: 1. Discontinue prednisone and observe 2. Continue current antiretroviral therapy 3. Followup after lab work in 6 weeks   Cliffton Asters, MD [facility] for Infectious Disease [facility] [phone] pager   [phone] cell 12/12/2012, 2:27 PM

## 2013-01-11 ENCOUNTER — Encounter: Payer: Medicaid Other | Admitting: Internal Medicine

## 2013-02-02 NOTE — Assessment & Plan Note (Signed)
He has a renal hematoma on the right and with an increase in pain there is concern for extension of the hematoma.  We will check a CBC to see if his hemoglobin is dropping.  His urine output is good and his vital signs are stable without tachycardia or hypotension.    Hemoglobin (g/dL)  Date Value  1/61/0960 8.6*  06/23/2012 7.6*  06/22/2012 7.4*   His hemoglobin has risen actually.  With his normal vital signs and increasing hemoglobin it is unlikely that he is acutely bleeding. We will continue to monitor and treat his pain with Norco 1 tablet every 4 hours as needed for the pain.

## 2013-02-13 ENCOUNTER — Other Ambulatory Visit: Payer: Self-pay | Admitting: Internal Medicine

## 2013-02-13 ENCOUNTER — Telehealth: Payer: Self-pay | Admitting: *Deleted

## 2013-02-13 MED ORDER — AZITHROMYCIN 250 MG PO TABS: ORAL_TABLET | ORAL | Status: DC

## 2013-02-13 NOTE — Telephone Encounter (Signed)
Please let Shiheem know that I sent a prescription for a Z pack to his pharmacy. Please ask him to make an appointment to se me in the next month, sooner if not better.

## 2013-02-13 NOTE — Telephone Encounter (Signed)
Pt requesting either appt or medication.  "Upper respiratory infection, sinusitis, green mucous/drainage, hoarse voice, ear pain, cough and fever 2 days ago."   No appointments available until Wednesday, May 21.  RN will send message to Dr. Orvan Falconer for advice.

## 2013-02-13 NOTE — Telephone Encounter (Signed)
Script sent  

## 2013-02-13 NOTE — Telephone Encounter (Signed)
Pt informed that medication sent to his pharmacy.  Pt verbalized understanding.

## 2013-02-13 NOTE — Telephone Encounter (Signed)
Pt scheduled w/ Dr Orvan Falconer for 02/23/13 @ 0930 per pt preference.

## 2013-02-13 NOTE — Telephone Encounter (Signed)
I

## 2013-02-23 ENCOUNTER — Ambulatory Visit (INDEPENDENT_AMBULATORY_CARE_PROVIDER_SITE_OTHER): Payer: Medicaid Other | Admitting: Internal Medicine

## 2013-02-23 ENCOUNTER — Encounter: Payer: Self-pay | Admitting: Internal Medicine

## 2013-02-23 VITALS — BP 160/90 | HR 78 | Temp 98.2°F | Ht 71.0 in | Wt 224.5 lb

## 2013-02-23 DIAGNOSIS — B20 Human immunodeficiency virus [HIV] disease: Secondary | ICD-10-CM

## 2013-02-23 LAB — CBC
HCT: 38 % — ABNORMAL LOW (ref 39.0–52.0)
Hemoglobin: 12.7 g/dL — ABNORMAL LOW (ref 13.0–17.0)
MCH: 30.5 pg (ref 26.0–34.0)
MCHC: 33.4 g/dL (ref 30.0–36.0)
RBC: 4.16 MIL/uL — ABNORMAL LOW (ref 4.22–5.81)

## 2013-02-23 LAB — COMPREHENSIVE METABOLIC PANEL
Albumin: 4.2 g/dL (ref 3.5–5.2)
BUN: 19 mg/dL (ref 6–23)
CO2: 24 mEq/L (ref 19–32)
Glucose, Bld: 163 mg/dL — ABNORMAL HIGH (ref 70–99)
Potassium: 4.8 mEq/L (ref 3.5–5.3)
Sodium: 135 mEq/L (ref 135–145)
Total Protein: 8.2 g/dL (ref 6.0–8.3)

## 2013-02-23 NOTE — Progress Notes (Signed)
Patient ID: Patrick [name], male   DOB: 1960/09/30, 52 y.o.   MRN: [id number]          [facility] for Infectious Disease  Patient Active Problem List   Diagnosis Date Noted  . Hives 12/07/2012    Priority: High  . HIV DISEASE 01/01/2009    Priority: High  . Splenomegaly 10/25/2012    Priority: Medium  . Rash 10/19/2012    Priority: Medium  . Chronic kidney disease 06/28/2012    Priority: Medium  . Diabetes, Type II 02/26/2012    Priority: Medium  . LYMPHADENOPATHY, DIFFUSE 11/18/2010    Priority: Medium  . HYPERTENSION 01/01/2009    Priority: Medium  . Herpes labialis 10/25/2012  . Anemia 06/28/2012  . Renal hematoma, right 06/20/2012  . Vitamin B12 deficiency 12/03/2011  . Hypogonadism male 03/27/2011  . GOUT, UNSPECIFIED 01/01/2009  . SECONDARY HYPERPARATHYROIDISM 01/01/2009  . NEPHROLITHIASIS 01/01/2009    Patient's Medications  New Prescriptions   No medications on file  Previous Medications   ABACAVIR (ZIAGEN) 300 MG TABLET    Take 2 tablets (600 mg total) by mouth daily.   ACETAMINOPHEN (TYLENOL) 325 MG TABLET    Take 2 tablets (650 mg total) by mouth every 6 (six) hours as needed (or Fever >/= 101).   ALBUTEROL (PROVENTIL HFA;VENTOLIN HFA) 108 (90 BASE) MCG/ACT INHALER    Inhale 2 puffs into the lungs every 6 (six) hours as needed for wheezing.   ALPRAZOLAM (XANAX) 0.25 MG TABLET    Take 1 tablet (0.25 mg total) by mouth 2 (two) times daily as needed for anxiety.   ATAZANAVIR (REYATAZ) 300 MG CAPSULE    Take 1 capsule (300 mg total) by mouth daily with breakfast.   FERROUS SULFATE 325 (65 FE) MG TABLET    Take 1 tablet (325 mg total) by mouth 3 (three) times daily.   HYDROXYZINE (ATARAX/VISTARIL) 25 MG TABLET    Take 1 tablet (25 mg total) by mouth every 6 (six) hours.   LABETALOL (NORMODYNE) 200 MG TABLET    Take 1 tablet (200 mg total) by mouth 2 (two) times daily.   LAMIVUDINE (EPIVIR) 150 MG TABLET    Take 1 tablet (150 mg total) by mouth daily.   PAROXETINE (PAXIL) 20 MG TABLET    Take 1 tablet (20 mg total) by mouth daily.   RITONAVIR (NORVIR) 100 MG TABS    Take 1 tablet (100 mg total) by mouth daily.   VALACYCLOVIR (VALTREX) 500 MG TABLET    Take 1 tablet (500 mg total) by mouth 2 (two) times daily.  Modified Medications   No medications on file  Discontinued Medications   AZITHROMYCIN (ZITHROMAX Z-PAK) 250 MG TABLET    Take as directed.   FAMOTIDINE (PEPCID) 20 MG TABLET    Take 1 tablet (20 mg total) by mouth 2 (two) times daily.    Subjective: Patrick [name] is seen on a work in basis. He recently developed subjective fevers, irritation of both eyes with redness, severe left-sided sore throat and sinus congestion. He tried over-the-counter symptomatic therapy but felt worse so I prescribed a Z-Pak. He felt better within 2 days of starting that. He did note, however, that he developed a fine red rash on his right arm after starting the Z-Pak. The rash does itch. He has not had any recurrences of his hives. He recalls missing 2 days of his antiretroviral therapy when he was sick and sleeping a great deal.  He says that he is  no longer feeling depressed or anxious. He did take a few doses of Xanax when it was first described several months ago but says that he did not like how it made him feel so sleepy so he quit taking it. He never filled his Paxil prescription.  Review of Systems: Constitutional: positive for fatigue, negative for anorexia, chills, sweats and weight loss Eyes: positive for irritation and redness, negative for visual disturbance Ears, nose, mouth, throat, and face: positive for nasal congestion and sore throat, negative for earaches Respiratory: negative Cardiovascular: negative Gastrointestinal: negative Genitourinary:positive for nothing, negative for dysuria and frequency Integument/breast: positive for pruritus and rash Behavioral/Psych: negative  Past Medical History  Diagnosis Date  . Hypertension   . HIV  (human immunodeficiency virus infection) 2010  . Hypogonadism male   . CKD (chronic kidney disease) stage 3, GFR 30-59 ml/min     Baseline Cr = 1.4  . Kidney stones   . Heart murmur   . Anemia   . Hernia   . Pancytopenia with fever 02/26/2012  . LYMPHADENOPATHY, DIFFUSE 11/18/2010  . Gallstones     History  Substance Use Topics  . Smoking status: Never Smoker   . Smokeless tobacco: Never Used  . Alcohol Use: 0.0 oz/week     Comment: 02/25/12 "couple mixed drinks or beers or glasses of wine/month"    Family History  Problem Relation Age of Onset  . Adopted: Yes    Allergies  Allergen Reactions  . Codeine Itching, Rash and Other (See Comments)    "crazy dreams"  . Sulfonamide Derivatives Rash  . Dapsone     Objective: Temp: 98.2 F (36.8 C) (05/29 0947) Temp src: Oral (05/29 0947) BP: 160/90 mmHg (05/29 0947) Pulse Rate: 78 (05/29 0947)  General: He is in good spirits Oral: No oropharyngeal lesions Lymph nodes: Stable anterior cervical and submandibular lymphadenopathy Skin: Fine, red maculopapular rash around right elbow. No hives noted Eyes: Bilateral conjunctival injection without discharge Lungs: Clear Cor: Regular S1 and S2 with no murmurs Abdomen: Obese, soft and nontender without any masses Joints and extremities: Normal Neuro: Alert and oriented Normal mood and affect  Lab Results HIV 1 RNA Quant (copies/mL)  Date Value  10/19/2012 <20   06/06/2012 <20   05/05/2012 <20      CD4 T Cell Abs (cmm)  Date Value  10/19/2012 200*  06/06/2012 180*  05/05/2012 370*     Lab Results  Component Value Date   WBC 5.1 10/19/2012   HGB 10.0* 10/19/2012   HCT 30.4* 10/19/2012   MCV 90.7 10/19/2012   PLT 171 10/19/2012   BMET    Component Value Date/Time   NA 134* 10/19/2012 1643   K 5.2 10/19/2012 1643   CL 101 10/19/2012 1643   CO2 26 10/19/2012 1643   GLUCOSE 265* 10/19/2012 1643   BUN 19 10/19/2012 1643   CREATININE 1.45* 10/19/2012 1643   CREATININE 1.50*  08/20/2012 1003   CALCIUM 9.0 10/19/2012 1643   CALCIUM 9.6 09/03/2011 1652   GFRNONAA 57* 06/23/2012 0620   GFRAA 66* 06/23/2012 0620   Lab Results  Component Value Date   ALT <8 10/19/2012   AST 7 10/19/2012   ALKPHOS 53 10/19/2012   BILITOT 1.5* 10/19/2012   Lab Results  Component Value Date   CHOL 91 10/06/2011   HDL Comment: <9 10/06/2011   LDLCALC NOT CALC 10/06/2011   TRIG 232* 10/06/2011   CHOLHDL Comment: NOT CALCULATED 10/06/2011   Assessment: I will need to  repeat blood work today but he has had good viral suppression on his current antiretroviral regimen and I will continue for now. He is having only very slow CD4 reconstitution which is not unusual.  I suspect he had a recent upper respiratory infection. It may have been viral but he did respond to a Z-Pak. He has some residual conjunctivitis is probably related to the infection but could also be due to seasonal allergies.  He said stable chronic renal insufficiency.  His blood pressure is still not at goal but that could be influenced by his recent acute illness.  I do not see any evidence that his HHV co-infection this flaring up again. He is due to see Dr. Quintella Baton at [facility] next week.  His depression and anxiety have resolved.  His right arm rash is nonspecific. He is already scheduled an appointment to see his dermatologist at [facility]. He has had no apparent recurrence of his hives.  Plan: 1. Continue current antiretroviral regimen 2. Repeat CD4 count, HIV viral load, CBC and complete metabolic panel today 3. Followup with Dr. Quintella Baton at [facility] next week 4. Followup here in 3 months   Cliffton Asters, MD [facility] for Infectious Disease [facility] [phone] pager   [phone] cell 02/23/2013, 10:19 AM

## 2013-02-24 LAB — T-HELPER CELL (CD4) - (RCID CLINIC ONLY)
CD4 % Helper T Cell: 11 % — ABNORMAL LOW (ref 33–55)
CD4 T Cell Abs: 380 uL — ABNORMAL LOW (ref 400–2700)

## 2013-02-24 LAB — HIV-1 RNA QUANT-NO REFLEX-BLD: HIV 1 RNA Quant: <20 copies/mL (ref <20)

## 2013-03-06 ENCOUNTER — Other Ambulatory Visit: Payer: Self-pay | Admitting: *Deleted

## 2013-03-06 MED ORDER — CYANOCOBALAMIN 1000 MCG/ML IJ SOLN: 1000.0000 ug | INTRAMUSCULAR | Status: DC

## 2013-03-07 ENCOUNTER — Telehealth: Payer: Self-pay | Admitting: *Deleted

## 2013-03-07 NOTE — Telephone Encounter (Signed)
Patient called and advised he is still feeling very bad. He advised his throat is still swollen and painful. He advised he felt better just after starting the Zpac but it started back feeling 3 days into the therapy. He reports no fever. The patient wants to know if Dr Orvan Falconer will send in a new Rx or if he wants to see him in the clinic. Advised the patient will send doctor a message and get back to him asap.

## 2013-03-07 NOTE — Telephone Encounter (Signed)
Called patient and gave him an appt 03/08/13 per Dr Orvan Falconer.

## 2013-03-07 NOTE — Telephone Encounter (Signed)
Please add [place] onto my schedule tomorrow afternoon.

## 2013-03-08 ENCOUNTER — Ambulatory Visit (INDEPENDENT_AMBULATORY_CARE_PROVIDER_SITE_OTHER): Payer: Medicaid Other | Admitting: Internal Medicine

## 2013-03-08 ENCOUNTER — Telehealth: Payer: Self-pay | Admitting: Licensed Clinical Social Worker

## 2013-03-08 ENCOUNTER — Encounter: Payer: Self-pay | Admitting: Internal Medicine

## 2013-03-08 VITALS — BP 162/89 | HR 89 | Temp 98.2°F | Ht 71.0 in | Wt 228.5 lb

## 2013-03-08 DIAGNOSIS — Z79899 Other long term (current) drug therapy: Secondary | ICD-10-CM

## 2013-03-08 DIAGNOSIS — B9689 Other specified bacterial agents as the cause of diseases classified elsewhere: Secondary | ICD-10-CM | POA: Insufficient documentation

## 2013-03-08 DIAGNOSIS — Z113 Encounter for screening for infections with a predominantly sexual mode of transmission: Secondary | ICD-10-CM

## 2013-03-08 DIAGNOSIS — J209 Acute bronchitis, unspecified: Secondary | ICD-10-CM

## 2013-03-08 MED ORDER — MOXIFLOXACIN HCL 400 MG PO TABS: 400.0000 mg | ORAL_TABLET | Freq: Every day | ORAL | Status: DC

## 2013-03-08 NOTE — Telephone Encounter (Signed)
Patient's rx that was given this morning for Avelox is not covered by medicaid it needs a prior authorization is there an alternative?

## 2013-03-08 NOTE — Progress Notes (Signed)
Patient ID: Patrick [name], male   DOB: 1961/08/21, 52 y.o.   MRN: [id number]          [facility]  Patient Active Problem List   Diagnosis Date Noted  . Hives 12/07/2012    Priority: High  . HIV DISEASE 01/01/2009    Priority: High  . Splenomegaly 10/25/2012    Priority: Medium  . Rash 10/19/2012    Priority: Medium  . Chronic kidney disease 06/28/2012    Priority: Medium  . Diabetes, Type II 02/26/2012    Priority: Medium  . LYMPHADENOPATHY, DIFFUSE 11/18/2010    Priority: Medium  . HYPERTENSION 01/01/2009    Priority: Medium  . Acute bronchitis 03/08/2013  . Herpes labialis 10/25/2012  . Anemia 06/28/2012  . Renal hematoma, right 06/20/2012  . Vitamin B12 deficiency 12/03/2011  . Hypogonadism male 03/27/2011  . GOUT, UNSPECIFIED 01/01/2009  . SECONDARY HYPERPARATHYROIDISM 01/01/2009  . NEPHROLITHIASIS 01/01/2009    Patient's Medications  New Prescriptions   MOXIFLOXACIN (AVELOX) 400 MG TABLET    Take 1 tablet (400 mg total) by mouth daily.  Previous Medications   ABACAVIR (ZIAGEN) 300 MG TABLET    Take 2 tablets (600 mg total) by mouth daily.   ACETAMINOPHEN (TYLENOL) 325 MG TABLET    Take 2 tablets (650 mg total) by mouth every 6 (six) hours as needed (or Fever >/= 101).   ALBUTEROL (PROVENTIL HFA;VENTOLIN HFA) 108 (90 BASE) MCG/ACT INHALER    Inhale 2 puffs into the lungs every 6 (six) hours as needed for wheezing.   ALPRAZOLAM (XANAX) 0.25 MG TABLET    Take 1 tablet (0.25 mg total) by mouth 2 (two) times daily as needed for anxiety.   ATAZANAVIR (REYATAZ) 300 MG CAPSULE    Take 1 capsule (300 mg total) by mouth daily with breakfast.   CYANOCOBALAMIN (,VITAMIN B-12,) 1000 MCG/ML INJECTION    Inject 1 mL (1,000 mcg total) into the muscle every 30 (thirty) days. Takes on Monday   FERROUS SULFATE 325 (65 FE) MG TABLET    Take 1 tablet (325 mg total) by mouth 3 (three) times daily.   HYDROXYZINE (ATARAX/VISTARIL) 25 MG TABLET    Take 1  tablet (25 mg total) by mouth every 6 (six) hours.   LABETALOL (NORMODYNE) 200 MG TABLET    Take 1 tablet (200 mg total) by mouth 2 (two) times daily.   LAMIVUDINE (EPIVIR) 150 MG TABLET    Take 1 tablet (150 mg total) by mouth daily.   PAROXETINE (PAXIL) 20 MG TABLET    Take 1 tablet (20 mg total) by mouth daily.   RITONAVIR (NORVIR) 100 MG TABS    Take 1 tablet (100 mg total) by mouth daily.   VALACYCLOVIR (VALTREX) 500 MG TABLET    Take 1 tablet (500 mg total) by mouth 2 (two) times daily.  Modified Medications   No medications on file  Discontinued Medications   No medications on file    Subjective: Patrick [name] is seen a work in basis. He continues to struggle with sore throat, sinus congestion, and cough productive of green thick sputum. He got transiently better when he was taking azithromycin but then started to feel worse for 5 days ago. He has not been aware of any fever, chills or sweats. His eyes have been slightly irritated and it "goopy". He was supposed to go to [facility] today to see Dr. Stark Klein but is not planning on going because he feels bad in cannot take  the time off from work. Review of Systems: Constitutional: positive for fatigue, negative for anorexia, chills, fevers, sweats and weight loss Eyes: positive for irritation and redness, negative for visual disturbance Ears, nose, mouth, throat, and face: positive for earaches, nasal congestion and sore throat, negative for ear drainage, hearing loss, hoarseness and sore mouth Respiratory: positive for cough and sputum, negative for dyspnea on exertion, pleurisy/chest pain and wheezing Cardiovascular: negative Gastrointestinal: negative Genitourinary:negative  Past Medical History  Diagnosis Date  . Hypertension   . HIV (human immunodeficiency virus infection) 2010  . Hypogonadism male   . CKD (chronic kidney disease) stage 3, GFR 30-59 ml/min     Baseline Cr = 1.4  . Kidney stones   . Heart murmur   . Anemia   . Hernia    . Pancytopenia with fever 02/26/2012  . LYMPHADENOPATHY, DIFFUSE 11/18/2010  . Gallstones     History  Substance Use Topics  . Smoking status: Never Smoker   . Smokeless tobacco: Never Used  . Alcohol Use: 0.0 oz/week     Comment: 02/25/12 "couple mixed drinks or beers or glasses of wine/month"    Family History  Problem Relation Age of Onset  . Adopted: Yes    Allergies  Allergen Reactions  . Codeine Itching, Rash and Other (See Comments)    "crazy dreams"  . Sulfonamide Derivatives Rash  . Dapsone     Objective: Temp: 98.2 F (36.8 C) (06/11 0850) Temp src: Oral (06/11 0850) BP: 162/89 mmHg (06/11 0850) Pulse Rate: 89 (06/11 0850)  General: He looks tired and worn out Oral: No oropharyngeal lesions Eyes: Conjunctival redness bilaterally without any discharge Skin: No rash Lymph nodes: Cervical and axillary adenopathy is at his baseline Lungs: Clear Cor: Regular S1 and S2 no murmurs Abdomen: Obese, soft and nontender Joints and extremities: Normal Neuro: Normal Mood and affect normal  Lab Results HIV 1 RNA Quant (copies/mL)  Date Value  02/23/2013 <20   10/19/2012 <20   06/06/2012 <20      CD4 T Cell Abs (cmm)  Date Value  02/23/2013 380*  10/19/2012 200*  06/06/2012 180*     Lab Results  Component Value Date   WBC 8.1 02/23/2013   HGB 12.7* 02/23/2013   HCT 38.0* 02/23/2013   MCV 91.3 02/23/2013   PLT 272 02/23/2013   BMET    Component Value Date/Time   NA 135 02/23/2013 1035   K 4.8 02/23/2013 1035   CL 99 02/23/2013 1035   CO2 24 02/23/2013 1035   GLUCOSE 163* 02/23/2013 1035   BUN 19 02/23/2013 1035   CREATININE 1.23 02/23/2013 1035   CREATININE 1.50* 08/20/2012 1003   CALCIUM 10.1 02/23/2013 1035   CALCIUM 9.6 09/03/2011 1652   GFRNONAA 57* 06/23/2012 0620   GFRAA 66* 06/23/2012 0620   Lab Results  Component Value Date   ALT 10 02/23/2013   AST 13 02/23/2013   ALKPHOS 82 02/23/2013   BILITOT 1.3* 02/23/2013   Assessment: He has acute purulent  bronchitis. I will treat him with a second round of antibiotic.  Plan: 1. Moxifloxacin 400 mg daily x5 days 2. Continue current antiretroviral therapy   Cliffton Asters, MD [facility] [phone] pager   [phone] cell 03/08/2013, 9:17 AM

## 2013-03-10 MED ORDER — LEVOFLOXACIN 500 MG PO TABS: 500.0000 mg | ORAL_TABLET | Freq: Every day | ORAL | Status: AC

## 2013-03-10 NOTE — Addendum Note (Signed)
Addended by: Cliffton Asters on: 03/10/2013 10:55 AM   Modules accepted: Orders, Medications

## 2013-03-10 NOTE — Telephone Encounter (Signed)
I switched him to levofloxacin.

## 2013-03-22 ENCOUNTER — Encounter: Payer: Self-pay | Admitting: Dietician

## 2013-03-22 ENCOUNTER — Telehealth: Payer: Self-pay | Admitting: Dietician

## 2013-03-22 DIAGNOSIS — E139 Other specified diabetes mellitus without complications: Secondary | ICD-10-CM

## 2013-03-22 NOTE — Telephone Encounter (Signed)
Patient request referral to Dr. Elmer Picker for his annual diabetes eye exam. He prefers a Monday or Tuesday. Appointment scheduled for Monday, March 27, 2013 at 12:30 PM. Patient notified. Faxed referral papers

## 2013-04-06 ENCOUNTER — Other Ambulatory Visit: Payer: Self-pay

## 2013-04-06 NOTE — Addendum Note (Signed)
Addended by: Jennet Maduro D on: 04/06/2013 03:46 PM   Modules accepted: Orders

## 2013-05-22 ENCOUNTER — Telehealth: Payer: Self-pay | Admitting: *Deleted

## 2013-05-22 ENCOUNTER — Telehealth: Payer: Self-pay | Admitting: Licensed Clinical Social Worker

## 2013-05-22 NOTE — Telephone Encounter (Signed)
Patient called to speak to RN today about chronic symptoms, RN scheduled an appointment for tomorrow. Patient called stating that he spoke with his Hematologist at [facility] and they were arranging a direct admission for today. Patient cancelled appointment for tomorrow.

## 2013-05-22 NOTE — Telephone Encounter (Signed)
Pt reports gradually increasing daily fever over 101 and fatigue.  Made appointment w/ Dr. Orvan Falconer for 05/23/13.

## 2013-05-23 ENCOUNTER — Ambulatory Visit: Payer: Medicaid Other | Admitting: Internal Medicine

## 2013-05-31 ENCOUNTER — Encounter: Payer: Medicaid Other | Admitting: Internal Medicine

## 2013-06-22 ENCOUNTER — Telehealth: Payer: Self-pay | Admitting: *Deleted

## 2013-06-22 ENCOUNTER — Other Ambulatory Visit: Payer: Self-pay | Admitting: *Deleted

## 2013-06-22 DIAGNOSIS — J069 Acute upper respiratory infection, unspecified: Secondary | ICD-10-CM

## 2013-06-22 MED ORDER — AZITHROMYCIN 250 MG PO TABS: ORAL_TABLET | ORAL | Status: DC

## 2013-06-22 NOTE — Telephone Encounter (Signed)
Patrick [name] is very congested. I agree with empiric trial of azithromycin.

## 2013-06-22 NOTE — Telephone Encounter (Signed)
Pt called requesting a referral to Dermatologist, this needs to be done once a year. He goes to Dr Stefanie Libel @ [facility] in [place]. Pt sees Dr Stefanie Libel for rash on leg and wart removal, these problems have returned.

## 2013-06-22 NOTE — Telephone Encounter (Signed)
Patient called c/o cough and congestion x 3 days, requesting Rx for Z-pak. Per Dr. Orvan Falconer Z-pak sent to [facility]. Wendall Mola CMA

## 2013-06-26 NOTE — Telephone Encounter (Signed)
That request sounds reasonable.  However, looking through the patient's chart, I'm not sure if I've ever seen him for this complaint, so I'm not entirely sure of the indication for the referral.  The patient has an appointment with me next week; we can discuss it at that visit and make the appropriate referral as indicated.  If this request cannot wait until our upcoming appointment, please let me know.

## 2013-07-06 ENCOUNTER — Encounter: Payer: Self-pay | Admitting: Internal Medicine

## 2013-07-06 ENCOUNTER — Ambulatory Visit (INDEPENDENT_AMBULATORY_CARE_PROVIDER_SITE_OTHER): Payer: Medicaid Other | Admitting: Internal Medicine

## 2013-07-06 VITALS — BP 138/84 | HR 95 | Temp 98.1°F | Ht 71.0 in | Wt 239.5 lb

## 2013-07-06 DIAGNOSIS — Z0000 Encounter for general adult medical examination without abnormal findings: Secondary | ICD-10-CM

## 2013-07-06 DIAGNOSIS — B078 Other viral warts: Secondary | ICD-10-CM

## 2013-07-06 DIAGNOSIS — B9689 Other specified bacterial agents as the cause of diseases classified elsewhere: Secondary | ICD-10-CM

## 2013-07-06 DIAGNOSIS — E119 Type 2 diabetes mellitus without complications: Secondary | ICD-10-CM

## 2013-07-06 DIAGNOSIS — D47Z2 Castleman disease: Secondary | ICD-10-CM

## 2013-07-06 DIAGNOSIS — E139 Other specified diabetes mellitus without complications: Secondary | ICD-10-CM

## 2013-07-06 DIAGNOSIS — B079 Viral wart, unspecified: Secondary | ICD-10-CM

## 2013-07-06 DIAGNOSIS — J019 Acute sinusitis, unspecified: Secondary | ICD-10-CM

## 2013-07-06 LAB — GLUCOSE, CAPILLARY: Glucose-Capillary: 308 mg/dL — ABNORMAL HIGH (ref 70–99)

## 2013-07-06 LAB — LIPID PANEL
Cholesterol: 156 mg/dL (ref 0–200)
Total CHOL/HDL Ratio: 6.8 Ratio
Triglycerides: 354 mg/dL — ABNORMAL HIGH (ref <150)
VLDL: 71 mg/dL — ABNORMAL HIGH (ref 0–40)

## 2013-07-06 LAB — POCT GLYCOSYLATED HEMOGLOBIN (HGB A1C): Hemoglobin A1C: 6.8

## 2013-07-06 MED ORDER — METFORMIN HCL 1000 MG PO TABS: 1000.0000 mg | ORAL_TABLET | Freq: Two times a day (BID) | ORAL | Status: DC

## 2013-07-06 MED ORDER — AMOXICILLIN-POT CLAVULANATE 875-125 MG PO TABS: 1.0000 | ORAL_TABLET | Freq: Two times a day (BID) | ORAL | Status: AC

## 2013-07-06 MED ORDER — METFORMIN HCL 500 MG PO TABS: 500.0000 mg | ORAL_TABLET | Freq: Two times a day (BID) | ORAL | Status: DC

## 2013-07-06 NOTE — Assessment & Plan Note (Signed)
Lab Results  Component Value Date   HGBA1C 6.8 07/06/2013   HGBA1C 6.7 08/29/2012   HGBA1C 7.1* 06/06/2012     Assessment: Diabetes control: good control (HgbA1C at goal) Progress toward A1C goal:  at goal Comments: His A1C remains controlled by our scale, but he had an outlier A1C of 8.5 at [facility] last month, prompting initiation of metformin.  We will uptitrate metformin to goal.  Plan: Medications:  Increase metformin slowly to 1000 mg BID Home glucose monitoring: Frequency:   Timing:   Instruction/counseling given: discussed foot care Educational resources provided: brochure Self management tools provided:   Other plans: Foot exam, urine microalbumin, and lipid panel done today

## 2013-07-06 NOTE — Assessment & Plan Note (Addendum)
-  discussed flu vaccine.  Per [facility], pts receiving rituximab should wait up to 6 months before receiving flu shot.  Pt due to start rituximab last week.  Attempted to contact Oncology for guidance regarding this matter, but repeat pages not returned.  Will continue to investigate.

## 2013-07-06 NOTE — Patient Instructions (Addendum)
General Instructions: For your Diabetes: 1. We are increasing your Metformin: -Week 1: Metformin 500 mg twice per day -Week 2: Metformin 1000 mg in the morning, 500 mg in the evening -Week 3: Metformin 1000 mg twice per day  Your symptoms of congestion may represent acute bacterial rhinosinusitis. -Take Augmentin, 1 tablet twice per day for 7 days  Please return for a follow-up visit in 3 months.   Treatment Goals:  Goals (1 Years of Data) as of 07/06/13     Lifestyle    . Increase physical activity     . Increase physical activity to 4 times a week       Progress Toward Treatment Goals:  Treatment Goal 07/06/2013  Hemoglobin A1C at goal  Blood pressure at goal    Self Care Goals & Plans:  Self Care Goal 07/06/2013  Manage my medications take my medicines as prescribed; refill my medications on time  Monitor my health keep track of my blood glucose; check my feet daily  Eat healthy foods drink diet soda or water instead of juice or soda; eat more vegetables  Be physically active find time in my schedule; take a walk every day    No flowsheet data found.   Care Management & Community Referrals:  Referral 07/06/2013  Referrals made for care management support none needed

## 2013-07-06 NOTE — Progress Notes (Signed)
HPI The patient is a 52 y.o. male with a history of DM, gout, HTN, CKD, presenting for a routine follow-up visit.  The patient has a history of DM.  A1C's at our clinic have always been borderline, with last A1C = 6.7 (08/2012), and A1C today = 6.8.  However, the patient was at [facility] 1 month ago, where his A1C was found to be 8.5, and he was started on Metformin 500 mg daily.  CBG today is 300.  The patient notes no symptoms of tremulousness, diaphoresis, AMS, blurry vision, polyuria/polydipsia, nausea/vomiting, or diarrhea/constipation.  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  He is scheduled to start rituximab next week.  The patient notes a 3-week history of symptoms of congestion, cough productive of green sputum, frontal sinus pain.  The patient notes no fevers.  The patient notes that 3 weeks ago the patient was exposed to a friend who he believes had influenza.  He described congestion to his ID physician, who prescribed azithromycin on 9/25, which the patient has completed.  The patient notes no muscle aches.  ROS: General: no fevers, chills, changes in weight, changes in appetite Skin: no rash HEENT: see HPI Pulm: no dyspnea, coughing, wheezing CV: no chest pain, palpitations, shortness of breath Abd: no abdominal pain, nausea/vomiting, diarrhea/constipation GU: no dysuria, hematuria, polyuria Ext: no arthralgias, myalgias Neuro: no weakness, numbness, or tingling  Filed Vitals:   07/06/13 0915  BP: 138/84  Pulse: 95  Temp: 98.1 F (36.7 C)    PEX General: alert, cooperative, and in no apparent distress HEENT: PERRL, EOMI, oropharynx non-erythematous, left maxillary sinus ttp Neck: supple, bilateral anterior cervical lymphadenopathy Lungs: clear to ascultation bilaterally, normal work of respiration, no wheezes, rales, ronchi Heart: regular rate and rhythm, no murmurs, gallops, or rubs Abdomen: soft, non-tender,  non-distended, normal bowel sounds Extremities: 2+ DP/PT pulses bilaterally, no cyanosis, clubbing, or edema Neurologic: alert & oriented X3, cranial nerves II-XII intact, strength grossly intact, sensation intact to light touch  Current Outpatient Prescriptions on File Prior to Visit  Medication Sig Dispense Refill  . abacavir (ZIAGEN) 300 MG tablet Take 2 tablets (600 mg total) by mouth daily.  60 tablet  11  . acetaminophen (TYLENOL) 325 MG tablet Take 2 tablets (650 mg total) by mouth every 6 (six) hours as needed (or Fever >/= 101).  15 tablet  0  . albuterol (PROVENTIL HFA;VENTOLIN HFA) 108 (90 BASE) MCG/ACT inhaler Inhale 2 puffs into the lungs every 6 (six) hours as needed for wheezing.      Marland Kitchen ALPRAZolam (XANAX) 0.25 MG tablet Take 1 tablet (0.25 mg total) by mouth 2 (two) times daily as needed for anxiety.  60 tablet  0  . atazanavir (REYATAZ) 300 MG capsule Take 1 capsule (300 mg total) by mouth daily with breakfast.  30 capsule  11  . azithromycin (ZITHROMAX) 250 MG tablet Take 2 tablets on day one, then 1 tablet a day for the next 4 days  6 each  0  . cyanocobalamin (,VITAMIN B-12,) 1000 MCG/ML injection Inject 1 mL (1,000 mcg total) into the muscle every 30 (thirty) days. Takes on Monday  1 mL  11  . hydrOXYzine (ATARAX/VISTARIL) 25 MG tablet Take 1 tablet (25 mg total) by mouth every 6 (six) hours.  12 tablet  0  . labetalol (NORMODYNE) 200 MG tablet Take 1 tablet (200 mg total) by mouth 2 (two) times daily.  60 tablet  6  .  lamiVUDine (EPIVIR) 150 MG tablet Take 1 tablet (150 mg total) by mouth daily.  30 tablet  11  . PARoxetine (PAXIL) 20 MG tablet Take 1 tablet (20 mg total) by mouth daily.  30 tablet  2  . ritonavir (NORVIR) 100 MG TABS Take 1 tablet (100 mg total) by mouth daily.  30 tablet  11  . valACYclovir (VALTREX) 500 MG tablet Take 1 tablet (500 mg total) by mouth 2 (two) times daily.  14 tablet  5   No current facility-administered medications on file prior to visit.     Assessment/Plan

## 2013-07-06 NOTE — Assessment & Plan Note (Signed)
The patient notes verruca on left hand, for which he sees [facility] for treatment.  He requests referral to return there. -referral sent for dermatology

## 2013-07-06 NOTE — Assessment & Plan Note (Signed)
The patient was recently prescribed azithromycin for acute bronchitis, with some improvement, followed by worsening of symptoms, with maxillary sinus tenderness, consistent with acute bacterial rhinosinusitis. -augmentin 875-125 BID x7 days

## 2013-07-07 ENCOUNTER — Telehealth: Payer: Self-pay | Admitting: Dietician

## 2013-07-07 LAB — MICROALBUMIN / CREATININE URINE RATIO
Creatinine, Urine: 231.8 mg/dL
Microalb Creat Ratio: 1040.5 mg/g — ABNORMAL HIGH (ref 0.0–30.0)

## 2013-07-07 NOTE — Telephone Encounter (Signed)
Patient says he got to work today and was hot, sweating and dizzy didn't feel good, checked his blood sugar and it was 489, rechecked it a little later and it was 436, then again and it was 406.   Breakfast today was Orange juice 16oz, sausage-1 patty, coffee- cream ~ 8 oz. Lunch- chix noodle soup, water. Has been drinking more water than usual.   Took metformin this am and plans to take another dose tonight.  Discussed patient with Dr. Manson Passey.   Advised patient per this discussion to decrease the sugar and starches in his diet, drink plenty of water until urine is light yellow and to continue increase in metformin as instructed by Dr Manson Passey yesterday and call us if his blood sugars do not continue to decrease. Patient verbalized understanding.

## 2013-07-12 ENCOUNTER — Telehealth: Payer: Self-pay | Admitting: *Deleted

## 2013-07-12 ENCOUNTER — Encounter: Payer: Self-pay | Admitting: Internal Medicine

## 2013-07-12 ENCOUNTER — Ambulatory Visit (INDEPENDENT_AMBULATORY_CARE_PROVIDER_SITE_OTHER): Payer: Medicaid Other | Admitting: Internal Medicine

## 2013-07-12 VITALS — BP 147/78 | HR 82 | Temp 97.1°F | Ht 71.0 in | Wt 237.0 lb

## 2013-07-12 DIAGNOSIS — E119 Type 2 diabetes mellitus without complications: Secondary | ICD-10-CM

## 2013-07-12 DIAGNOSIS — R7309 Other abnormal glucose: Secondary | ICD-10-CM

## 2013-07-12 DIAGNOSIS — R739 Hyperglycemia, unspecified: Secondary | ICD-10-CM | POA: Insufficient documentation

## 2013-07-12 DIAGNOSIS — E139 Other specified diabetes mellitus without complications: Secondary | ICD-10-CM

## 2013-07-12 LAB — BASIC METABOLIC PANEL
CO2: 23 mEq/L (ref 19–32)
Calcium: 9.4 mg/dL (ref 8.4–10.5)
Chloride: 94 mEq/L — ABNORMAL LOW (ref 96–112)
Creat: 1.3 mg/dL (ref 0.50–1.35)
Glucose, Bld: 477 mg/dL — ABNORMAL HIGH (ref 70–99)
Sodium: 132 mEq/L — ABNORMAL LOW (ref 135–145)

## 2013-07-12 LAB — GLUCOSE, CAPILLARY

## 2013-07-12 MED ORDER — INSULIN GLARGINE 100 UNIT/ML SOLOSTAR PEN: 15.0000 [IU] | PEN_INJECTOR | Freq: Every day | SUBCUTANEOUS | Status: DC

## 2013-07-12 MED ORDER — "PEN NEEDLES 3/16"" 31G X 5 MM MISC": 1.0000 | Freq: Every day | Status: DC

## 2013-07-12 MED ORDER — INSULIN ASPART 100 UNIT/ML ~~LOC~~ SOLN
7.0000 [IU] | Freq: Once | SUBCUTANEOUS | Status: AC
  Administered 2013-07-12: 7 [IU] via SUBCUTANEOUS

## 2013-07-12 MED ORDER — SYRINGE (DISPOSABLE) 1 ML MISC: 1.0000 | Status: AC

## 2013-07-12 NOTE — Progress Notes (Signed)
HPI The patient is a 52 y.o. male with a history of DM, HIV, HTN, CKD, castleman's disease, presenting for an acute visit for hyperglycemia.  The patient has a history of DM, with last A1C = 6.8.  Yesterday, the patient presented for chemo (rituximab), but had an allergic reaction (throat closing), and was given steroids.  CBG's increased from 250 to the 700's.  He was taken to the ED, acutely treated, and discharged.  Prior to this episode, his blood sugars have been in the 300's upon awakening.  This morning, his CBG was 520, and in clinic CBG was 468.  The patient notes symptoms of polyuria/polydipsia, shakiness, sweating.  No blurry vision.  ROS: General: no fevers, chills, changes in weight, changes in appetite Skin: no rash HEENT: no blurry vision, hearing changes, sore throat Pulm: no dyspnea, coughing, wheezing CV: no chest pain, palpitations, shortness of breath Abd: no abdominal pain, nausea/vomiting, diarrhea/constipation GU: no dysuria, hematuria, polyuria Ext: no arthralgias, myalgias Neuro: no weakness, numbness, or tingling  Filed Vitals:   07/12/13 1401  BP: 147/78  Pulse: 82  Temp: 97.1 F (36.2 C)    PEX General: alert, cooperative, and in no apparent distress HEENT: pupils equal round and reactive to light, vision grossly intact, oropharynx clear and non-erythematous  Neck: supple Lungs: clear to ascultation bilaterally, normal work of respiration, no wheezes, rales, ronchi Heart: regular rate and rhythm, no murmurs, gallops, or rubs Abdomen: soft, non-tender, non-distended, normal bowel sounds Extremities: no cyanosis, clubbing, or edema Neurologic: alert & oriented X3, cranial nerves II-XII intact, strength grossly intact, sensation intact to light touch  Current Outpatient Prescriptions on File Prior to Visit  Medication Sig Dispense Refill  . abacavir (ZIAGEN) 300 MG tablet Take 2 tablets (600 mg total) by mouth daily.  60 tablet  11  . acetaminophen  (TYLENOL) 325 MG tablet Take 2 tablets (650 mg total) by mouth every 6 (six) hours as needed (or Fever >/= 101).  15 tablet  0  . albuterol (PROVENTIL HFA;VENTOLIN HFA) 108 (90 BASE) MCG/ACT inhaler Inhale 2 puffs into the lungs every 6 (six) hours as needed for wheezing.      Marland Kitchen ALPRAZolam (XANAX) 0.25 MG tablet Take 1 tablet (0.25 mg total) by mouth 2 (two) times daily as needed for anxiety.  60 tablet  0  . amoxicillin-clavulanate (AUGMENTIN) 875-125 MG per tablet Take 1 tablet by mouth 2 (two) times daily.  14 tablet  0  . atazanavir (REYATAZ) 300 MG capsule Take 1 capsule (300 mg total) by mouth daily with breakfast.  30 capsule  11  . cyanocobalamin (,VITAMIN B-12,) 1000 MCG/ML injection Inject 1 mL (1,000 mcg total) into the muscle every 30 (thirty) days. Takes on Monday  1 mL  11  . hydrOXYzine (ATARAX/VISTARIL) 25 MG tablet Take 1 tablet (25 mg total) by mouth every 6 (six) hours.  12 tablet  0  . labetalol (NORMODYNE) 200 MG tablet Take 1 tablet (200 mg total) by mouth 2 (two) times daily.  60 tablet  6  . lamiVUDine (EPIVIR) 150 MG tablet Take 1 tablet (150 mg total) by mouth daily.  30 tablet  11  . metFORMIN (GLUCOPHAGE) 1000 MG tablet Take 1 tablet (1,000 mg total) by mouth 2 (two) times daily with a meal.  60 tablet  11  . metFORMIN (GLUCOPHAGE) 500 MG tablet Take 1 tablet (500 mg total) by mouth 2 (two) times daily with a meal. Increase as instructed by physician  60 tablet  0  . PARoxetine (PAXIL) 20 MG tablet Take 1 tablet (20 mg total) by mouth daily.  30 tablet  2  . ritonavir (NORVIR) 100 MG TABS Take 1 tablet (100 mg total) by mouth daily.  30 tablet  11  . valACYclovir (VALTREX) 500 MG tablet Take 1 tablet (500 mg total) by mouth 2 (two) times daily.  14 tablet  5   No current facility-administered medications on file prior to visit.    Assessment/Plan

## 2013-07-12 NOTE — Telephone Encounter (Signed)
Pt called had reaction to chemo 07/11/13 at [facility]  and was given steroids for reaction. Left ER at [facility] today - they kept pt in ER do to high CBG. This AM was 522. Appt made with Dr Manson Passey 07/12/13 1:45PM.

## 2013-07-12 NOTE — Assessment & Plan Note (Addendum)
The patient has a history of diabetes, with last A1c equaled 6.8. However, he reports fasting blood sugars in the 300s, and recently as high 700s after steroids, requiring treatment at [facility]. Today, we spent a great deal of time in teaching insulin pen usage. We will start with Lantus, 15 units (1.5 per kilograms) daily. -Start with Lantus 15 units daily -Patient to up titrate by 2 units a day if 3 days in a relatively disease greater than 200 fasting -Patient to down titrate by 5 units with any episodes of hypoglycemia -Patient should followup with Norm Parcel in one to 2 weeks  Addendum: After speaking with Lupita Leash Plyler later in the day, short acting NovoLog may have been an equal or better alternative. We will monitor, and if patient is having difficult to control blood sugar, will consider changing from Lantus to NovoLog.  Addendum 10/16: BMET shows AG = 15.  Called patient with these results.  Patient reports he is asymptomatic.  Unable to obtain pen needles from his pharmacy (out of stock), but going to another pharmacy to obtain these and start Lantus.  Pt advised to drink plenty of water, start Lantus 5 units this morning, 15 units this evening, then report for BMET in AM 10/17.

## 2013-07-12 NOTE — Patient Instructions (Signed)
General Instructions: For your diabetes, we are starting Lantus insulin. -inject 15 units subcutaneous at the same time each day -if morning blood sugars are persistently elevated above 200 for 3 days in a row, increase your Lantus by 2 units. -if you experience any low blood sugar episodes (blood sugar less than 70, or symptoms of shakiness, sweatiness, or feeling not quite right), IMMEDIATELY eat something with readily available sugar (hard candy, orange juice, soda). -decrease your Lantus by 5 units  Please return for a visit with [facility] in 1-2 weeks.  Please return for an office visit in about 4 weeks.   Treatment Goals:  Goals (1 Years of Data) as of 07/12/13     Lifestyle    . Increase physical activity     . Increase physical activity to 4 times a week       Progress Toward Treatment Goals:  Treatment Goal 07/12/2013  Hemoglobin A1C at goal  Blood pressure at goal    Self Care Goals & Plans:  Self Care Goal 07/12/2013  Manage my medications take my medicines as prescribed; bring my medications to every visit  Monitor my health keep track of my blood glucose; bring my glucose meter and log to each visit  Eat healthy foods -  Be physically active -    Home Blood Glucose Monitoring 07/12/2013  Check my blood sugar 3 times a day  When to check my blood sugar before meals     Care Management & Community Referrals:  Referral 07/12/2013  Referrals made for care management support none needed

## 2013-07-13 NOTE — Addendum Note (Signed)
Addended by: Linward Headland on: 07/13/2013 10:59 AM   Modules accepted: Orders

## 2013-07-13 NOTE — Progress Notes (Signed)
Case discussed with Dr. Brown at the time of the visit.  We reviewed the resident's history and exam and pertinent patient test results.  I agree with the assessment, diagnosis, and plan of care documented in the resident's note. 

## 2013-07-14 ENCOUNTER — Other Ambulatory Visit (INDEPENDENT_AMBULATORY_CARE_PROVIDER_SITE_OTHER): Payer: Medicaid Other

## 2013-07-14 DIAGNOSIS — E119 Type 2 diabetes mellitus without complications: Secondary | ICD-10-CM

## 2013-07-14 DIAGNOSIS — E139 Other specified diabetes mellitus without complications: Secondary | ICD-10-CM

## 2013-07-14 LAB — BASIC METABOLIC PANEL
BUN: 30 mg/dL — ABNORMAL HIGH (ref 6–23)
CO2: 26 mEq/L (ref 19–32)
Calcium: 9.3 mg/dL (ref 8.4–10.5)
Chloride: 97 mEq/L (ref 96–112)
Creat: 1.1 mg/dL (ref 0.50–1.35)
Glucose, Bld: 269 mg/dL — ABNORMAL HIGH (ref 70–99)
Potassium: 4 mEq/L (ref 3.5–5.3)

## 2013-08-02 ENCOUNTER — Encounter: Payer: Self-pay | Admitting: Internal Medicine

## 2013-08-02 ENCOUNTER — Encounter: Payer: Medicaid Other | Admitting: Dietician

## 2013-08-02 NOTE — Addendum Note (Signed)
Addended by: Neomia Dear on: 08/02/2013 07:07 PM   Modules accepted: Orders

## 2013-08-03 ENCOUNTER — Other Ambulatory Visit: Payer: Self-pay

## 2013-08-29 ENCOUNTER — Other Ambulatory Visit: Payer: Self-pay | Admitting: Internal Medicine

## 2013-09-29 ENCOUNTER — Other Ambulatory Visit: Payer: Self-pay | Admitting: *Deleted

## 2013-09-29 MED ORDER — ALBUTEROL SULFATE HFA 108 (90 BASE) MCG/ACT IN AERS: 2.0000 | INHALATION_SPRAY | Freq: Four times a day (QID) | RESPIRATORY_TRACT | Status: DC | PRN

## 2013-10-08 ENCOUNTER — Encounter: Payer: Self-pay | Admitting: Internal Medicine

## 2013-11-22 ENCOUNTER — Telehealth: Payer: Self-pay | Admitting: *Deleted

## 2013-11-22 ENCOUNTER — Encounter: Payer: Self-pay | Admitting: Internal Medicine

## 2013-11-22 ENCOUNTER — Ambulatory Visit (INDEPENDENT_AMBULATORY_CARE_PROVIDER_SITE_OTHER): Payer: Medicaid Other | Admitting: Internal Medicine

## 2013-11-22 VITALS — BP 153/93 | HR 93 | Temp 97.0°F | Ht 71.0 in | Wt 243.9 lb

## 2013-11-22 DIAGNOSIS — J019 Acute sinusitis, unspecified: Secondary | ICD-10-CM | POA: Insufficient documentation

## 2013-11-22 DIAGNOSIS — R6889 Other general symptoms and signs: Secondary | ICD-10-CM

## 2013-11-22 DIAGNOSIS — B9689 Other specified bacterial agents as the cause of diseases classified elsewhere: Secondary | ICD-10-CM | POA: Insufficient documentation

## 2013-11-22 DIAGNOSIS — J3489 Other specified disorders of nose and nasal sinuses: Secondary | ICD-10-CM

## 2013-11-22 DIAGNOSIS — E119 Type 2 diabetes mellitus without complications: Secondary | ICD-10-CM

## 2013-11-22 DIAGNOSIS — R0982 Postnasal drip: Secondary | ICD-10-CM

## 2013-11-22 DIAGNOSIS — E139 Other specified diabetes mellitus without complications: Secondary | ICD-10-CM

## 2013-11-22 LAB — POCT GLYCOSYLATED HEMOGLOBIN (HGB A1C): HEMOGLOBIN A1C: 7

## 2013-11-22 LAB — GLUCOSE, CAPILLARY: GLUCOSE-CAPILLARY: 185 mg/dL — AB (ref 70–99)

## 2013-11-22 MED ORDER — SALINE SPRAY 0.65 % NA SOLN: 1.0000 | NASAL | Status: AC | PRN

## 2013-11-22 MED ORDER — AMOXICILLIN-POT CLAVULANATE 875-125 MG PO TABS: 1.0000 | ORAL_TABLET | Freq: Two times a day (BID) | ORAL | Status: AC

## 2013-11-22 NOTE — Assessment & Plan Note (Signed)
Lab Results  Component Value Date   HGBA1C 7.0 11/22/2013   HGBA1C 6.8 07/06/2013   HGBA1C 6.7 08/29/2012     Assessment: Diabetes control: good control (HgbA1C at goal) Progress toward A1C goal:  at goal Comments: compliant with medications   Plan: Medications:  continue current medications Home glucose monitoring: Frequency:   Timing:   Instruction/counseling given: no instruction/counseling  Educational resources provided:   Self management tools provided:   Other plans: follow up with PCP in a few weeks. No need to change his current regiment.

## 2013-11-22 NOTE — Progress Notes (Addendum)
Patient ID: Patrick [name], male   DOB: 03/02/1961, 53 y.o.   MRN: [id number] HPI The patient is a 53 y.o. male with a history of DM, HIV, HTN, CKD, castleman's disease, presenting for an with a "sinus infection" for 3 weeks.   Patient reports that his been suffering from rhinorrhea with green mucus, postnasal drip and sneezing for the past 3 weeks. He initially had a cough, but this has largely resolved. He denies sore throat. He reports general malaise, tireness, headaches, and facial pain. His appetite has been normal. He reports that his eyes have been watery. He reports nausea and frequent sneezing. He does report some stomach ache, which started 4 days ago and associated with on and off NB loose stools. He has tried Mucinex, aspirin, and Tylenol without much relief. He denies shortness of breath, chest pain, or cough. He reports that even though he feels that his ears are about to pop, he doesn't have ear drainage. He reports subjective fevers and chills. A couple household members have had similar symptoms, but they have improved, and when his symptoms persisted, he decided to come to the clinic to be evaluated.  Patient has a past medical history of HIV, for which he is on treatment, and his last CD4 in 10/25/2013 was 291. The viral load was <40 (performed at [facility] and available in care everywhere)   ROS: Skin: no rash CV: no chest pain, palpitations, shortness of breath Abd: no abdominal pain GU: no dysuria, hematuria, polyuria Ext: no arthralgias, myalgias Neuro: no weakness, numbness, or tingling  Filed Vitals:   11/22/13 1640  BP: 153/93  Pulse: 93  Temp: 97 F (36.1 C)  SatO2 = 97% on RA   PEX General: alert, cooperative. In mild distress. HEENT: pupils equal round and reactive to light, vision grossly intact, oropharynx clear and non-erythematous. Hyperemic tympanic membranes bilaterally without bulging. No active drainage, no tenderness. Tenderness around the face  and sinuses.  Neck: supple Lungs: clear to ascultation bilaterally, normal work of respiration, no wheezes, rales, ronchi Heart: regular rate and rhythm, no murmurs, gallops, or rubs Abdomen: soft, non-tender, non-distended, normal bowel sounds Extremities: no cyanosis, clubbing, or edema Neurologic: alert & oriented X3  Current Outpatient Prescriptions on File Prior to Visit  Medication Sig Dispense Refill  . abacavir (ZIAGEN) 300 MG tablet Take 2 tablets (600 mg total) by mouth daily.  60 tablet  11  . acetaminophen (TYLENOL) 325 MG tablet Take 2 tablets (650 mg total) by mouth every 6 (six) hours as needed (or Fever >/= 101).  15 tablet  0  . albuterol (PROVENTIL HFA;VENTOLIN HFA) 108 (90 BASE) MCG/ACT inhaler Inhale 2 puffs into the lungs every 6 (six) hours as needed for wheezing.  8.5 g  11  . atazanavir (REYATAZ) 300 MG capsule Take 1 capsule (300 mg total) by mouth daily with breakfast.  30 capsule  11  . cyanocobalamin (,VITAMIN B-12,) 1000 MCG/ML injection Inject 1 mL (1,000 mcg total) into the muscle every 30 (thirty) days. Takes on Monday  1 mL  11  . Insulin Glargine (LANTUS SOLOSTAR) 100 UNIT/ML SOPN Inject 15 Units into the skin daily.  15 mL  11  . Insulin Pen Needle (PEN NEEDLES 3/16") 31G X 5 MM MISC Inject 1 Package into the skin daily.  100 each  3  . labetalol (NORMODYNE) 200 MG tablet Take 1 tablet (200 mg total) by mouth 2 (two) times daily.  60 tablet  6  . lamiVUDine (  EPIVIR) 150 MG tablet Take 1 tablet (150 mg total) by mouth daily.  30 tablet  11  . metFORMIN (GLUCOPHAGE) 1000 MG tablet Take 1 tablet (1,000 mg total) by mouth 2 (two) times daily with a meal.  60 tablet  11  . ritonavir (NORVIR) 100 MG TABS Take 1 tablet (100 mg total) by mouth daily.  30 tablet  11  . Syringe, Disposable, 1 ML MISC Inject 1 Package into the muscle every 30 (thirty) days. For B12 injections  25 each  1  . valACYclovir (VALTREX) 500 MG tablet Take 1 tablet (500 mg total) by mouth 2  (two) times daily.  14 tablet  5   No current facility-administered medications on file prior to visit.    Assessment/Plan  I have discussed my assessment and plan  with Dr. Ellwood Dense. Please see details under problem based charting.

## 2013-11-22 NOTE — Assessment & Plan Note (Signed)
Assessment: Most likely diagnosis: acute bacterial rhinosinusitis in view of persistence and not improving symptoms for more than 10 days. Bilateral tympanic membrane hyperemia makes me worry about the possibility of, bacteremia infection involving his ears. Ddx: Viral URI. Bacterial pneumonia is unlikely given his lack of fever, and good oxygen saturation. His lung exam is normal.    Plan: 1. Labs/imaging: none - but can consider chest x-ray or sinus CT scan if symptoms do not improve. 2. Therapy: Augmentin 875-125 mg twice daily for 7 days.  3. Follow up: if symptoms persist, otherwise to see Dr. Owens Shark in 2-3 weeks.

## 2013-11-22 NOTE — Patient Instructions (Addendum)
Please take Augmentin 1 pill twice day for 7 days  Please use saline nasal drops for you nasal pain  Please take Tylenol over the counter for pain  Please take plenty of water to help you symptoms If you continue feeling unwell please call back the clinic for an appointment Otherwise, please see Dr Owens Shark in 2-3 weeks  Sinusitis Sinusitis is redness, soreness, and puffiness (inflammation) of the air pockets in the bones of your face (sinuses). The redness, soreness, and puffiness can cause air and mucus to get trapped in your sinuses. This can allow germs to grow and cause an infection.  HOME CARE   Drink enough fluids to keep your pee (urine) clear or pale yellow.  Use a humidifier in your home.  Run a hot shower to create steam in the bathroom. Sit in the bathroom with the door closed. Breathe in the steam 3 4 times a day.  Put a warm, moist washcloth on your face 3 4 times a day, or as told by your doctor.  Use salt water sprays (saline sprays) to wet the thick fluid in your nose. This can help the sinuses drain.  Only take medicine as told by your doctor. GET HELP RIGHT AWAY IF:   Your pain gets worse.  You have very bad headaches.  You are sick to your stomach (nauseous).  You throw up (vomit).  You are very sleepy (drowsy) all the time.  Your face is puffy (swollen).  Your vision changes.  You have a stiff neck.  You have trouble breathing. MAKE SURE YOU:   Understand these instructions.  Will watch your condition.  Will get help right away if you are not doing well or get worse. Document Released: 03/02/2008 Document Revised: 06/08/2012 Document Reviewed: 04/19/2012 [facility] Patient Information 2014 [place].

## 2013-11-22 NOTE — Telephone Encounter (Signed)
Pt called with c/o low grade fever, nausea, productive cough and sinus pressure with headache. Sneezing and not sleeping well. Denies SOB Onset 3 weeks ago.  Unable to see in clinic today advised [facility] for evaluation.

## 2013-11-27 NOTE — Progress Notes (Signed)
Case discussed with Dr.Kazibwe at the time of the visit.  We reviewed the resident's history and exam and pertinent patient test results.  I agree with the assessment, diagnosis, and plan of care documented in the resident's note.    

## 2013-12-27 ENCOUNTER — Ambulatory Visit (INDEPENDENT_AMBULATORY_CARE_PROVIDER_SITE_OTHER): Payer: Medicaid Other | Admitting: Internal Medicine

## 2013-12-27 ENCOUNTER — Telehealth: Payer: Self-pay | Admitting: *Deleted

## 2013-12-27 ENCOUNTER — Encounter: Payer: Self-pay | Admitting: Internal Medicine

## 2013-12-27 VITALS — BP 129/83 | HR 87 | Temp 97.5°F | Ht 71.0 in | Wt 240.0 lb

## 2013-12-27 DIAGNOSIS — E139 Other specified diabetes mellitus without complications: Secondary | ICD-10-CM

## 2013-12-27 DIAGNOSIS — J189 Pneumonia, unspecified organism: Secondary | ICD-10-CM | POA: Insufficient documentation

## 2013-12-27 DIAGNOSIS — D47Z2 Castleman disease: Secondary | ICD-10-CM

## 2013-12-27 DIAGNOSIS — E119 Type 2 diabetes mellitus without complications: Secondary | ICD-10-CM

## 2013-12-27 LAB — GLUCOSE, CAPILLARY: Glucose-Capillary: 157 mg/dL — ABNORMAL HIGH (ref 70–99)

## 2013-12-27 MED ORDER — LEVOFLOXACIN 750 MG PO TABS: 750.0000 mg | ORAL_TABLET | Freq: Every day | ORAL | Status: AC

## 2013-12-27 NOTE — Patient Instructions (Signed)
General Instructions: Your cough and fever may represent a pneumonia.  We are treating you with Levaquin, take 1 tablet once per day for 5 days.  Please return for a follow-up visit in 3 months   Treatment Goals:  Goals (1 Years of Data) as of 12/27/13     Lifestyle    . Increase physical activity     . Increase physical activity to 4 times a week       Progress Toward Treatment Goals:  Treatment Goal 12/27/2013  Hemoglobin A1C at goal  Blood pressure at goal    Self Care Goals & Plans:  Self Care Goal 12/27/2013  Manage my medications take my medicines as prescribed; bring my medications to every visit  Monitor my health keep track of my blood glucose  Eat healthy foods drink diet soda or water instead of juice or soda; eat more vegetables  Be physically active take a walk every day    Home Blood Glucose Monitoring 07/12/2013  Check my blood sugar 3 times a day  When to check my blood sugar before meals     Care Management & Community Referrals:  Referral 12/27/2013  Referrals made for care management support none needed

## 2013-12-27 NOTE — Progress Notes (Signed)
HPI The patient is a 53 y.o. male with a history of HIV (CD4 = 380), DM2, Castleman's disease, presenting for an acute visit for cough.  For the last 7 days, the patient has noted symptoms of cough productive of green sputum, fever up to 101.0 (two days ago), watery eyes, nasal congestion, sinus pressure.  The patient has been using Guaifenesin, OTC flonase, claritin, with only minimal relief.  He notes no sick contacts.  The patient was recently seen 6 weeks ago for acute bacterial rhinosinusitis, and was prescribed Augmentin, with resolution of symptoms at that time.  He has had several sinus infections this year.  The patient has a history of Castleman's disease.  He completed Rituximab in November, and has no further chemotherapy scheduled.  He continues to follow closely with oncology.  The patient has a history of DM.  His CBG is 157 today, last A1C was 7.0.  He identifies diet and exercise as personal goals.  He is currently taking metformin 1000 mg BID, and Lantus 15 U once/day.  He checks his CBG's infrequently at home, once/week.  ROS: General: changes in weight, changes in appetite Skin: no rash HEENT: see HPI Pulm: see HPI CV: no chest pain, palpitations, shortness of breath Abd: no abdominal pain, nausea/vomiting, diarrhea/constipation GU: no dysuria, hematuria, polyuria Ext: no arthralgias, myalgias Neuro: no weakness, numbness, or tingling  Filed Vitals:   12/27/13 1540  BP: 129/83  Pulse: 87  Temp: 97.5 F (36.4 C)    PEX General: alert, cooperative, appears fatigued HEENT: posterior pharyngeal erythema noted Neck: supple, no lymphadenopathy Lungs: normal work of respiration, mild course breath sounds heard in right middle lobe Heart: regular rate and rhythm, no murmurs, gallops, or rubs Abdomen: soft, non-tender, non-distended, normal bowel sounds Extremities: no cyanosis, clubbing, or edema Neurologic: alert & oriented X3, cranial nerves II-XII intact, strength  grossly intact, sensation intact to light touch  Current Outpatient Prescriptions on File Prior to Visit  Medication Sig Dispense Refill  . abacavir (ZIAGEN) 300 MG tablet Take 2 tablets (600 mg total) by mouth daily.  60 tablet  11  . acetaminophen (TYLENOL) 325 MG tablet Take 2 tablets (650 mg total) by mouth every 6 (six) hours as needed (or Fever >/= 101).  15 tablet  0  . albuterol (PROVENTIL HFA;VENTOLIN HFA) 108 (90 BASE) MCG/ACT inhaler Inhale 2 puffs into the lungs every 6 (six) hours as needed for wheezing.  8.5 g  11  . aspirin EC 81 MG tablet Take 81 mg by mouth.      Marland Kitchen atazanavir (REYATAZ) 300 MG capsule Take 1 capsule (300 mg total) by mouth daily with breakfast.  30 capsule  11  . cyanocobalamin (,VITAMIN B-12,) 1000 MCG/ML injection Inject 1 mL (1,000 mcg total) into the muscle every 30 (thirty) days. Takes on Monday  1 mL  11  . ferrous sulfate 325 (65 FE) MG tablet Take 325 mg by mouth.      . Insulin Glargine (LANTUS SOLOSTAR) 100 UNIT/ML SOPN Inject 15 Units into the skin daily.  15 mL  11  . Insulin Pen Needle (PEN NEEDLES 3/16") 31G X 5 MM MISC Inject 1 Package into the skin daily.  100 each  3  . labetalol (NORMODYNE) 200 MG tablet Take 1 tablet (200 mg total) by mouth 2 (two) times daily.  60 tablet  6  . lamiVUDine (EPIVIR) 150 MG tablet Take 1 tablet (150 mg total) by mouth daily.  30 tablet  11  .  metFORMIN (GLUCOPHAGE) 1000 MG tablet Take 1 tablet (1,000 mg total) by mouth 2 (two) times daily with a meal.  60 tablet  11  . Multiple Vitamin (MULTI-VITAMINS) TABS Take 1 tablet by mouth daily.      . ritonavir (NORVIR) 100 MG TABS Take 1 tablet (100 mg total) by mouth daily.  30 tablet  11  . sodium chloride (OCEAN) 0.65 % SOLN nasal spray Place 1 spray into both nostrils as needed for congestion.  60 mL  0  . Syringe, Disposable, 1 ML MISC Inject 1 Package into the muscle every 30 (thirty) days. For B12 injections  25 each  1  . valACYclovir (VALTREX) 500 MG tablet Take  1 tablet (500 mg total) by mouth 2 (two) times daily.  14 tablet  5   No current facility-administered medications on file prior to visit.    Assessment/Plan

## 2013-12-27 NOTE — Telephone Encounter (Signed)
States he has a recurrent URI, coughing, congested, ears ache, throat sore, has not checked temp. States he is miserable, appt 1515 dr brown

## 2013-12-27 NOTE — Assessment & Plan Note (Signed)
Lab Results  Component Value Date   HGBA1C 7.0 11/22/2013   HGBA1C 6.8 07/06/2013   HGBA1C 6.7 08/29/2012     Assessment: Diabetes control: good control (HgbA1C at goal) Progress toward A1C goal:  at goal Comments: A1C well controlled at last visit, and CBG ok today.  Encouraged compliance with CBG monitoring.  Pt wants to try more diet/exercise changes.  Plan: Medications:  continue current medications Home glucose monitoring: Frequency:   Timing:   Instruction/counseling given: reminded to bring blood glucose meter & log to each visit Educational resources provided: brochure;handout Self management tools provided:   Other plans: Recheck urine microalbumin at next visit (elevated once, need to repeat)

## 2013-12-27 NOTE — Assessment & Plan Note (Signed)
The patient's symptoms of productive cough, fever, and RML course breath sounds are concerning for CAP.  Differential includes URI vs sinusitis.  Unlikely opportunistic infection (CD4 = 380, rituximab use doesn't necessarily increase the patient's risk of opportunistic infections) -prescribed levaquin 750 mg PO daily x5 days -pt may continue OTC medications -in the future, if upper respiratory infections continue to recur, consider ENT referral

## 2013-12-28 ENCOUNTER — Telehealth: Payer: Self-pay | Admitting: *Deleted

## 2013-12-28 NOTE — Telephone Encounter (Signed)
Pt calls and states his ears feel really full, he states he will give the abx time to work and call Monday if he isnt better

## 2013-12-28 NOTE — Progress Notes (Signed)
Case discussed with Dr. Brown at the time of the visit.  We reviewed the resident's history and exam and pertinent patient test results.  I agree with the assessment, diagnosis and plan of care documented in the resident's note. 

## 2014-01-01 NOTE — Telephone Encounter (Signed)
Patrick [name] calls back and states his ears are very uncomfortable, would you like to call him [phone]? Appt? Please advise

## 2014-01-02 NOTE — Telephone Encounter (Signed)
Pt has called and left a message again, he states he thinks he had a reaction to the abx, continues to feel bad and his ears are  getting worse, would like something else that might help.  Made an appt for f/u w/ dr Eyvonne Mechanic 4/8 at 1315

## 2014-01-03 ENCOUNTER — Ambulatory Visit (INDEPENDENT_AMBULATORY_CARE_PROVIDER_SITE_OTHER): Payer: Medicaid Other | Admitting: Internal Medicine

## 2014-01-03 ENCOUNTER — Encounter: Payer: Self-pay | Admitting: Internal Medicine

## 2014-01-03 VITALS — BP 146/89 | HR 81 | Temp 97.8°F | Ht 71.0 in | Wt 241.1 lb

## 2014-01-03 DIAGNOSIS — M25522 Pain in left elbow: Secondary | ICD-10-CM | POA: Insufficient documentation

## 2014-01-03 DIAGNOSIS — J329 Chronic sinusitis, unspecified: Secondary | ICD-10-CM

## 2014-01-03 DIAGNOSIS — E119 Type 2 diabetes mellitus without complications: Secondary | ICD-10-CM

## 2014-01-03 DIAGNOSIS — M25529 Pain in unspecified elbow: Secondary | ICD-10-CM

## 2014-01-03 DIAGNOSIS — J019 Acute sinusitis, unspecified: Secondary | ICD-10-CM

## 2014-01-03 LAB — GLUCOSE, CAPILLARY: Glucose-Capillary: 183 mg/dL — ABNORMAL HIGH (ref 70–99)

## 2014-01-03 NOTE — Assessment & Plan Note (Addendum)
Unclear etiology. Given the recent history of intake of levofloxacin, obviously there is concern for tendonitis involving the posterior group of muscles of the upper arm and forearm. But looking at the case reports of tendinitis involving quinolones, most of the cases were presented one to 2 days after the start of the quinolones. But our patient finished full course of levofloxacin after which he developed the symptoms. So not really sure, if that timing is good enough to call it tendonitis from quinolones. Discussed the case with Dr. Eppie Gibson and decided to treat the patient conservatively at this point.  Plans: Ice packing twice daily for a week. Use Tylenol as needed for pain.

## 2014-01-03 NOTE — Patient Instructions (Signed)
Apply ice packs three times daily over your left elbow and left hand fingers. Take Tylenol as needed for pain.  If your symptoms do not improve or worsen, please give [place] a call. Follow up with ENT doctor as recommended. Take all the other medications as advised.

## 2014-01-03 NOTE — Assessment & Plan Note (Signed)
Most likely allergic etiology. Patient states that his ear pain didn't resolve after finishing the abx therapy. Patient also reports that he gets recurrent episodes of nasal congestion complicated by ear pain and sinusitis.   Plans: Refer to ENT for further evaluation. Recommended patient to take Claritin for allergic symptoms.  Irrigation of the right ear was done and wax was removed.

## 2014-01-03 NOTE — Progress Notes (Signed)
Subjective:   Patient ID: Patrick [name] male   DOB: [birth year] 53 y.o.   MRN: [id number]  HPI: Patrick [name] is a 53 y.o. gentleman with a past medical history significant for hypertension, HIV, chronic kidney disease comes to the office with chief complaint of left elbow pain and right ear pain.  The patient was seen in the clinic on 12/27/2013 for symptoms suggestive of community acquired pneumonia, upper respiratory tract infection and was prescribed levofloxacin for 5 days. Patient apparently finished his antibiotic therapy 3 days ago on Sunday. On Monday, he woke up with left elbow pain and the inability to extend the the middle finger and the ring finger of the left hand. He went to see his kidney doctor yesterday and was told by the nurse that Levaquin causes tendinitis. Patient is here today to report these symptoms and to find out if Levaquin is the culprit of his symptoms. Patient states that he has "soreness" while moving his left elbow and his left middle and ring finger. He denies any fever, chills, body pains, rash, swelling of the joints, trauma.   Patient reports that his cough and nasal congestion have subsided. Patient reports that he still have right ear pain. He reports that his right ear pain worsens upon sneezing and blowing his nose.   He denies any other complaints.    Past Medical History  Diagnosis Date  . Hypertension   . HIV (human immunodeficiency virus infection) 2010  . Hypogonadism male   . CKD (chronic kidney disease) stage 3, GFR 30-59 ml/min     Baseline Cr = 1.4  . Kidney stones   . Heart murmur   . Anemia   . Hernia   . Pancytopenia with fever 02/26/2012  . LYMPHADENOPATHY, DIFFUSE 11/18/2010  . Gallstones    Current Outpatient Prescriptions  Medication Sig Dispense Refill  . abacavir (ZIAGEN) 300 MG tablet Take 2 tablets (600 mg total) by mouth daily.  60 tablet  11  . acetaminophen (TYLENOL) 325 MG tablet Take 2 tablets (650 mg total) by  mouth every 6 (six) hours as needed (or Fever >/= 101).  15 tablet  0  . albuterol (PROVENTIL HFA;VENTOLIN HFA) 108 (90 BASE) MCG/ACT inhaler Inhale 2 puffs into the lungs every 6 (six) hours as needed for wheezing.  8.5 g  11  . aspirin EC 81 MG tablet Take 81 mg by mouth.      Marland Kitchen atazanavir (REYATAZ) 300 MG capsule Take 1 capsule (300 mg total) by mouth daily with breakfast.  30 capsule  11  . cyanocobalamin (,VITAMIN B-12,) 1000 MCG/ML injection Inject 1 mL (1,000 mcg total) into the muscle every 30 (thirty) days. Takes on Monday  1 mL  11  . ferrous sulfate 325 (65 FE) MG tablet Take 325 mg by mouth.      . Insulin Glargine (LANTUS SOLOSTAR) 100 UNIT/ML SOPN Inject 15 Units into the skin daily.  15 mL  11  . Insulin Pen Needle (PEN NEEDLES 3/16") 31G X 5 MM MISC Inject 1 Package into the skin daily.  100 each  3  . labetalol (NORMODYNE) 200 MG tablet Take 1 tablet (200 mg total) by mouth 2 (two) times daily.  60 tablet  6  . lamiVUDine (EPIVIR) 150 MG tablet Take 1 tablet (150 mg total) by mouth daily.  30 tablet  11  . levofloxacin (LEVAQUIN) 750 MG tablet Take 1 tablet (750 mg total) by mouth daily.  5 tablet  0  .  metFORMIN (GLUCOPHAGE) 1000 MG tablet Take 1 tablet (1,000 mg total) by mouth 2 (two) times daily with a meal.  60 tablet  11  . Multiple Vitamin (MULTI-VITAMINS) TABS Take 1 tablet by mouth daily.      . ritonavir (NORVIR) 100 MG TABS Take 1 tablet (100 mg total) by mouth daily.  30 tablet  11  . sodium chloride (OCEAN) 0.65 % SOLN nasal spray Place 1 spray into both nostrils as needed for congestion.  60 mL  0  . Syringe, Disposable, 1 ML MISC Inject 1 Package into the muscle every 30 (thirty) days. For B12 injections  25 each  1  . valACYclovir (VALTREX) 500 MG tablet Take 1 tablet (500 mg total) by mouth 2 (two) times daily.  14 tablet  5   No current facility-administered medications for this visit.   Family History  Problem Relation Age of Onset  . Adopted: Yes   History    Social History  . Marital Status: Single    Spouse Name: N/A    Number of Children: N/A  . Years of Education: N/A   Social History Main Topics  . Smoking status: Never Smoker   . Smokeless tobacco: Never Used  . Alcohol Use: 0.0 oz/week     Comment: 02/25/12 "couple mixed drinks or beers or glasses of wine/month"  . Drug Use: No  . Sexual Activity: None   Other Topics Concern  . None   Social History Narrative  . None   Review of Systems: Negative except for HPI.  Objective:  Physical Exam: Filed Vitals:   01/03/14 1339  BP: 146/89  Pulse: 81  Temp: 97.8 F (36.6 C)  TempSrc: Oral  Height: 5\' 11"  (1.803 m)  Weight: 241 lb 1.6 oz (109.362 kg)  SpO2: 99%   Constitutional: Vital signs reviewed.   No acute distress and cooperative with exam. Alert and oriented x3.  Ear: Hard wax noted in the right ear and Right TM is not visible. Left TM visible. No signs of pus or redness in the left ear. Nose: No erythema or drainage noted.  Turbinates normal Mouth: no erythema or exudates, MMM Cardiovascular: RRR, S1 normal, S2 normal Pulmonary/Chest: normal respiratory effort, CTAB, no wheezes, rales, or rhonchi Musculoskeletal: No joint deformities, erythema, or stiffness, ROM full and no nontender Left Elbow: There is no swelling or redness in the left elbow area or surrounding areas. On palpation there is slight tenderness to deep palpation over the superior and inferior aspects of posterior elbow. Range of motions are normal. There is no bony point tenderness. There is no pain upon supination or pronation of the elbow.  Left middle finger and ring finger. There is no swelling or redness. On palpation there is slight tenderness to deep palpation noted at the third and fourth metacarpophalangeal joints and proximal interphalangeal joints.  Assessment & Plan:

## 2014-01-06 NOTE — Progress Notes (Signed)
Case discussed with Dr. Boggala at time of visit.  We reviewed the resident's history and exam and pertinent patient test results.  I agree with the assessment, diagnosis, and plan of care documented in the resident's note. 

## 2014-01-09 ENCOUNTER — Ambulatory Visit: Payer: Medicaid Other | Admitting: Internal Medicine

## 2014-01-09 ENCOUNTER — Encounter: Payer: Self-pay | Admitting: Internal Medicine

## 2014-01-09 ENCOUNTER — Ambulatory Visit (INDEPENDENT_AMBULATORY_CARE_PROVIDER_SITE_OTHER): Payer: Medicaid Other | Admitting: Internal Medicine

## 2014-01-09 ENCOUNTER — Telehealth: Payer: Self-pay | Admitting: *Deleted

## 2014-01-09 VITALS — BP 150/86 | HR 77 | Temp 98.3°F | Ht 71.0 in | Wt 243.3 lb

## 2014-01-09 DIAGNOSIS — M25529 Pain in unspecified elbow: Secondary | ICD-10-CM

## 2014-01-09 DIAGNOSIS — E119 Type 2 diabetes mellitus without complications: Secondary | ICD-10-CM

## 2014-01-09 DIAGNOSIS — B20 Human immunodeficiency virus [HIV] disease: Secondary | ICD-10-CM

## 2014-01-09 DIAGNOSIS — M25522 Pain in left elbow: Secondary | ICD-10-CM

## 2014-01-09 LAB — GLUCOSE, CAPILLARY: Glucose-Capillary: 139 mg/dL — ABNORMAL HIGH (ref 70–99)

## 2014-01-09 MED ORDER — ACCU-CHEK FASTCLIX LANCET KIT: PACK | Status: DC

## 2014-01-09 MED ORDER — ACCU-CHEK NANO SMARTVIEW W/DEVICE KIT: PACK | Status: AC

## 2014-01-09 MED ORDER — GLUCOSE BLOOD VI STRP: ORAL_STRIP | Status: DC

## 2014-01-09 NOTE — Telephone Encounter (Signed)
Dr. Eyvonne Mechanic just saw the patient and discussed this with me. Unsure if this "supposed tendinopathy like symptoms" are indeed from levofloxacin. The patient has been prescribed moxifloxacin in the past with no apparent adverse consequences that he can remember. Secondary to patient insistence, we collectively decided to refer patient to ortho for a second opinion and imaging if any required. The patient is happy to do that.

## 2014-01-09 NOTE — Telephone Encounter (Signed)
Dr B:  This wound up in my inbox - you will be attending in clinic this afternoon - can you please help? Thanks Dr Algis Greenhouse

## 2014-01-09 NOTE — Patient Instructions (Signed)
We will call you with an appointment with orthopedics. Take all the medications as advised. If your symptoms worsen or persist, please give [place] a call or seek medical help.

## 2014-01-09 NOTE — Telephone Encounter (Signed)
Pt calls and feels he could be having a reaction to levaquin, he is quite concerned about this and would like for the attending to join dr Eyvonne Mechanic at his visit today. He had a bad reaction to meds in the past and is anxious because that caused him some terrible problems. He is not angry just afraid and worried

## 2014-01-09 NOTE — Progress Notes (Signed)
Subjective:   Patient ID: Patrick [name] male   DOB: 1961/02/26 53 y.o.   MRN: [id number]  HPI: Patrick [name] is a 53 y.o. with PMH significant for HTN, DM-II, HIV disease comes to the office with CC of left elbow pain.  Patient was seen in the clinic on 01/03/14 for the same complaints and there was question whether his symptoms could be attributable to the tendonitis from levofloxacin. After discussing with Dr. Eppie Gibson on that day, it was decided to treat his symptoms conservatively with NSAIDS and topical ice packs. Patient reports that his symptoms persisted despite using NSAIDs and wants to investigate further with an "MRI". He states that he called the pharmaceutical company and reported about the side effects he has had. He states that his left elbow is still sore and feels "heavy". He denies any tingling or numbness. He states that the elbow soreness sometimes radiates above to the upper arm. He also complaints of left 3rd and 4th finger soreness and states that he has been having right 3rd and 4th finger soreness as well.   He denies any fever, chills, body pains. He states that he is very concerned about his symptoms and would like to take a cautious approach.     Past Medical History  Diagnosis Date  . Hypertension   . HIV (human immunodeficiency virus infection) 2010  . Hypogonadism male   . CKD (chronic kidney disease) stage 3, GFR 30-59 ml/min     Baseline Cr = 1.4  . Kidney stones   . Heart murmur   . Anemia   . Hernia   . Pancytopenia with fever 02/26/2012  . LYMPHADENOPATHY, DIFFUSE 11/18/2010  . Gallstones    Current Outpatient Prescriptions  Medication Sig Dispense Refill  . abacavir (ZIAGEN) 300 MG tablet Take 2 tablets (600 mg total) by mouth daily.  60 tablet  11  . acetaminophen (TYLENOL) 325 MG tablet Take 2 tablets (650 mg total) by mouth every 6 (six) hours as needed (or Fever >/= 101).  15 tablet  0  . albuterol (PROVENTIL HFA;VENTOLIN HFA) 108 (90 BASE)  MCG/ACT inhaler Inhale 2 puffs into the lungs every 6 (six) hours as needed for wheezing.  8.5 g  11  . aspirin EC 81 MG tablet Take 81 mg by mouth.      Marland Kitchen atazanavir (REYATAZ) 300 MG capsule Take 1 capsule (300 mg total) by mouth daily with breakfast.  30 capsule  11  . cyanocobalamin (,VITAMIN B-12,) 1000 MCG/ML injection Inject 1 mL (1,000 mcg total) into the muscle every 30 (thirty) days. Takes on Monday  1 mL  11  . ferrous sulfate 325 (65 FE) MG tablet Take 325 mg by mouth.      . Insulin Glargine (LANTUS SOLOSTAR) 100 UNIT/ML SOPN Inject 15 Units into the skin daily.  15 mL  11  . Insulin Pen Needle (PEN NEEDLES 3/16") 31G X 5 MM MISC Inject 1 Package into the skin daily.  100 each  3  . labetalol (NORMODYNE) 200 MG tablet Take 1 tablet (200 mg total) by mouth 2 (two) times daily.  60 tablet  6  . lamiVUDine (EPIVIR) 150 MG tablet Take 1 tablet (150 mg total) by mouth daily.  30 tablet  11  . metFORMIN (GLUCOPHAGE) 1000 MG tablet Take 1 tablet (1,000 mg total) by mouth 2 (two) times daily with a meal.  60 tablet  11  . Multiple Vitamin (MULTI-VITAMINS) TABS Take 1 tablet by mouth daily.      Marland Kitchen  ritonavir (NORVIR) 100 MG TABS Take 1 tablet (100 mg total) by mouth daily.  30 tablet  11  . sodium chloride (OCEAN) 0.65 % SOLN nasal spray Place 1 spray into both nostrils as needed for congestion.  60 mL  0  . Syringe, Disposable, 1 ML MISC Inject 1 Package into the muscle every 30 (thirty) days. For B12 injections  25 each  1  . valACYclovir (VALTREX) 500 MG tablet Take 1 tablet (500 mg total) by mouth 2 (two) times daily.  14 tablet  5   No current facility-administered medications for this visit.   Family History  Problem Relation Age of Onset  . Adopted: Yes   History   Social History  . Marital Status: Single    Spouse Name: N/A    Number of Children: N/A  . Years of Education: N/A   Social History Main Topics  . Smoking status: Never Smoker   . Smokeless tobacco: Never Used  .  Alcohol Use: 0.0 oz/week     Comment: 02/25/12 "couple mixed drinks or beers or glasses of wine/month"  . Drug Use: No  . Sexual Activity: None   Other Topics Concern  . None   Social History Narrative  . None   Review of Systems: Pertinent items are noted in HPI. Objective:  Physical Exam: Filed Vitals:   01/09/14 1343  BP: 150/86  Pulse: 77  Temp: 98.3 F (36.8 C)  TempSrc: Oral  Height: 5\' 11"  (1.803 m)  Weight: 243 lb 4.8 oz (110.36 kg)  SpO2: 98%   Constitutional: Vital signs reviewed.  No acute distress and cooperative with exam. Alert and oriented x3.  Nose: No erythema or drainage noted. Turbinates normal  Mouth: no erythema or exudates, MMM  Cardiovascular: RRR, S1 normal, S2 normal  Pulmonary/Chest: normal respiratory effort, CTAB, no wheezes, rales, or rhonchi  Musculoskeletal: No joint deformities, erythema, or stiffness, ROM full and no nontender Left Elbow: There is no swelling or redness in the left elbow area or surrounding areas. On palpation, he reports slight tenderness to deep palpation over the superior and inferior aspects of posterior elbow. Range of motions are normal. There is no bony point tenderness. There is no pain upon supination or pronation of the left elbow.  Left middle finger and ring finger. There is no swelling or redness. On palpation there is slight tenderness to deep palpation noted at the third and fourth metacarpophalangeal joints and proximal interphalangeal joints. Right middle and ring finger: There is no swelling or redness. On palpation there is slight tenderness to deep palpation noted at the third and fourth metacarpophalangeal joints and proximal interphalangeal joints.    Assessment & Plan:

## 2014-01-09 NOTE — Assessment & Plan Note (Signed)
Unclear etiology. Symptoms suggestive of tendonitis in left upper extremity and right hand. Persistence of symptoms despite conservative management.   Plans Orthopedic referral for a second opinion and for further management. Continue conservative management.

## 2014-01-18 NOTE — Progress Notes (Signed)
Case discussed with Dr. Boggala at the time of the visit.  We reviewed the resident's history and exam and pertinent patient test results.  I agree with the assessment, diagnosis, and plan of care documented in the resident's note. 

## 2014-01-31 ENCOUNTER — Encounter: Payer: Medicaid Other | Admitting: Internal Medicine

## 2014-02-15 ENCOUNTER — Ambulatory Visit (INDEPENDENT_AMBULATORY_CARE_PROVIDER_SITE_OTHER): Payer: Medicaid Other | Admitting: Internal Medicine

## 2014-02-15 ENCOUNTER — Encounter: Payer: Self-pay | Admitting: Internal Medicine

## 2014-02-15 VITALS — BP 199/121 | HR 83 | Temp 98.1°F | Wt 241.5 lb

## 2014-02-15 DIAGNOSIS — D47Z2 Castleman disease: Secondary | ICD-10-CM | POA: Insufficient documentation

## 2014-02-15 DIAGNOSIS — R599 Enlarged lymph nodes, unspecified: Secondary | ICD-10-CM

## 2014-02-15 DIAGNOSIS — Z79899 Other long term (current) drug therapy: Secondary | ICD-10-CM

## 2014-02-15 DIAGNOSIS — Z113 Encounter for screening for infections with a predominantly sexual mode of transmission: Secondary | ICD-10-CM

## 2014-02-15 DIAGNOSIS — B20 Human immunodeficiency virus [HIV] disease: Secondary | ICD-10-CM

## 2014-02-15 DIAGNOSIS — Z23 Encounter for immunization: Secondary | ICD-10-CM

## 2014-02-15 LAB — CBC
HEMATOCRIT: 41.2 % (ref 39.0–52.0)
HEMOGLOBIN: 14.6 g/dL (ref 13.0–17.0)
MCH: 34 pg (ref 26.0–34.0)
MCHC: 35.4 g/dL (ref 30.0–36.0)
MCV: 95.8 fL (ref 78.0–100.0)
Platelets: 176 10*3/uL (ref 150–400)
RBC: 4.3 MIL/uL (ref 4.22–5.81)
RDW: 14.3 % (ref 11.5–15.5)
WBC: 5.3 10*3/uL (ref 4.0–10.5)

## 2014-02-15 NOTE — Progress Notes (Signed)
Patient ID: Patrick [name], male   DOB: 1960-10-09, 53 y.o.   MRN: [id number]          Patient Active Problem List   Diagnosis Date Noted  . 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 HIV DISEASE 01/01/2009    Priority: High  . Left elbow pain 01/03/2014    Priority: Medium  . Splenomegaly 10/25/2012    Priority: Medium  . Verruca 10/19/2012    Priority: Medium  . Chronic kidney disease 06/28/2012    Priority: Medium  . Diabetes, Type II 02/26/2012    Priority: Medium  . HYPERTENSION 01/01/2009    Priority: Medium  . 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 CAP (community acquired pneumonia) 12/27/2013  . Preventative health care 07/06/2013  . Bilirubinemia 05/25/2013  . Hives 12/07/2012  . Herpes labialis 10/25/2012  . Anemia 06/28/2012  . Vitamin B12 deficiency 12/03/2011  . Hypogonadism male 03/27/2011  . GOUT, UNSPECIFIED 01/01/2009  . SECONDARY HYPERPARATHYROIDISM 01/01/2009  . NEPHROLITHIASIS 01/01/2009    Patient's Medications  New Prescriptions   No medications on file  Previous Medications   ABACAVIR (ZIAGEN) 300 MG TABLET    Take 2 tablets (600 mg total) by mouth daily.   ACETAMINOPHEN (TYLENOL) 325 MG TABLET    Take 2 tablets (650 mg total) by mouth every 6 (six) hours as needed (or Fever >/= 101).   ALBUTEROL (PROVENTIL HFA;VENTOLIN HFA) 108 (90 BASE) MCG/ACT INHALER    Inhale 2 puffs into the lungs every 6 (six) hours as needed for wheezing.   ASPIRIN EC 81 MG TABLET    Take 81 mg by mouth.   ATAZANAVIR (REYATAZ) 300 MG CAPSULE    Take 1 capsule (300 mg total) by mouth daily with breakfast.   BLOOD GLUCOSE MONITORING SUPPL (ACCU-CHEK NANO SMARTVIEW) W/DEVICE KIT    Check blood sugars once a day.   CYANOCOBALAMIN (,VITAMIN B-12,) 1000 MCG/ML INJECTION    Inject 1 mL (1,000 mcg total) into the muscle every 30 (thirty) days. Takes on Monday   FERROUS SULFATE 325 (65 FE) MG TABLET    Take 325 mg by mouth.   GLUCOSE BLOOD (ACCU-CHEK SMARTVIEW) TEST STRIP     Use as instructed   INSULIN GLARGINE (LANTUS SOLOSTAR) 100 UNIT/ML SOPN    Inject 15 Units into the skin daily.   INSULIN PEN NEEDLE (PEN NEEDLES 3/16") 31G X 5 MM MISC    Inject 1 Package into the skin daily.   LABETALOL (NORMODYNE) 200 MG TABLET    Take 1 tablet (200 mg total) by mouth 2 (two) times daily.   LAMIVUDINE (EPIVIR) 150 MG TABLET    Take 1 tablet (150 mg total) by mouth daily.   LANCETS MISC. (ACCU-CHEK FASTCLIX LANCET) KIT    Use as instructed.   METFORMIN (GLUCOPHAGE) 1000 MG TABLET    Take 1 tablet (1,000 mg total) by mouth 2 (two) times daily with a meal.   MOMETASONE (NASONEX) 50 MCG/ACT NASAL SPRAY    Place 2 sprays into the nose daily.   MULTIPLE VITAMIN (MULTI-VITAMINS) TABS    Take 1 tablet by mouth daily.   RITONAVIR (NORVIR) 100 MG TABS    Take 1 tablet (100 mg total) by mouth daily.   SODIUM CHLORIDE (OCEAN) 0.65 % SOLN NASAL SPRAY    Place 1 spray into both nostrils as needed for congestion.   SYRINGE, DISPOSABLE, 1 ML MISC    Inject 1 Package into the muscle every 30 (thirty) days. For B12 injections  VALACYCLOVIR (VALTREX) 500 MG TABLET    Take 1 tablet (500 mg total) by mouth 2 (two) times daily.  Modified Medications   No medications on file  Discontinued Medications   No medications on file    Subjective: Ramirez is in for his first visit with me in one year. 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 He was started on rituximab and took it until October of last year. His disease has been in remission since that time.  He has struggled recently with recurrent sinusitis and was also treated for possible community-acquired pneumonia in early April. He received Augmentin followed by Levaquin. Shortly after starting Levaquin he noticed a sensation that his left hand and left foot were dramatically swollen and "dysmorphic". He admits that although it felt like he had swelling he actually  didn't. He also noted some new left elbow and shoulder pain. He was worried about tendinitis related to the Levaquin. His symptoms did not improve after stopping Levaquin. He is also noticed some episodic flexion of his third fourth and fifth fingers on his left hand. He was referred to see Dr. Oretha Caprice, orthopedist, who suggested that he be seen by a neurologist or rheumatologist. He had seen a neurologist at [facility] last fall for possible neuropathy. He has had some intermittent tingling in his hands and feet. It started about a year and a half ago and may have worsened slightly after taking rituximab. He is scheduled to see a rheumatologist soon at [facility].  He does not recall missing any doses of his Combivir, Ziagen, Reyataz or Norvir. His medication has been renally dosed but his most recent creatinines have been normal.  Review of Systems: Constitutional: positive for malaise, negative for anorexia, chills, fevers and weight loss Eyes: negative Ears, nose, mouth, throat, and face: positive for nasal congestion and voice change, negative for sore throat Respiratory: negative Cardiovascular: negative Gastrointestinal: negative Genitourinary:negative  Past Medical History  Diagnosis Date  . Hypertension   . HIV (human immunodeficiency virus infection) 2010  . Hypogonadism male   . CKD (chronic kidney disease) stage 3, GFR 30-59 ml/min     Baseline Cr = 1.4  . Kidney stones   . Heart murmur   . Anemia   . Hernia   . Pancytopenia with fever 02/26/2012  . LYMPHADENOPATHY, DIFFUSE 11/18/2010  . Gallstones     History  Substance Use Topics  . Smoking status: Never Smoker   . Smokeless tobacco: Never Used  . Alcohol Use: 0.0 oz/week     Comment: 02/25/12 "couple mixed drinks or beers or glasses of wine/month"    Family History  Problem Relation Age of Onset  . Adopted: Yes    Allergies  Allergen Reactions  . Codeine Itching, Rash and Other (See Comments)    "crazy  dreams"  . Sulfonamide Derivatives Rash  . Dapsone     Objective: Temp: 98.1 F (36.7 C) (05/21 1047) Temp src: Oral (05/21 1047) BP: 199/121 mmHg (05/21 1047) Pulse Rate: 83 (05/21 1047) Body mass index is 33.7 kg/(m^2).  General: He sounds congested and has a hoarse voice Oral: Mild pharyngeal redness without other oropharyngeal lesions Skin: No rash Lungs: Clear Cor: Regular S1 and S2 with no murmurs Abdomen: Obese, soft nontender Joints and extremities: No acute abnormalities noted. He has no point tenderness over his left elbow or pain with range of motion. He has no  swelling, redness or warmth. Neuro: Normal strength in all extremities. Normal gait  Lab Results Lab Results  Component Value Date   WBC 8.1 02/23/2013   HGB 12.7* 02/23/2013   HCT 38.0* 02/23/2013   MCV 91.3 02/23/2013   PLT 272 02/23/2013    Lab Results  Component Value Date   CREATININE 1.10 07/14/2013   BUN 30* 07/14/2013   NA 134* 07/14/2013   K 4.0 07/14/2013   CL 97 07/14/2013   CO2 26 07/14/2013    Lab Results  Component Value Date   ALT 10 02/23/2013   AST 13 02/23/2013   ALKPHOS 82 02/23/2013   BILITOT 1.3* 02/23/2013    Lab Results  Component Value Date   CHOL 156 07/06/2013   HDL 23* 07/06/2013   LDLCALC 62 07/06/2013   TRIG 354* 07/06/2013   CHOLHDL 6.8 07/06/2013    Lab Results HIV 1 RNA Quant (copies/mL)  Date Value  02/23/2013 <20   10/19/2012 <20   06/06/2012 <20      CD4 T Cell Abs (cmm)  Date Value  02/23/2013 380*  10/19/2012 200*  06/06/2012 180*     Assessment: He recalls that his CD4 count and viral load were normal around January of this year at [facility]. I will repeat his lab work today and see him back within the next 2 weeks. If his renal function remains normal he will need a change in dosing of his Epivir or a change to a simpler regimen such as Triumeq.  I doubt that he has acute bacterial sinusitis and I will not treat him with another round of antibiotics. I suggested  that he try Afrin nasal spray for the next 3-4 days.  It is possible that he may have some multifactorial neuropathy but I'm not sure what is causing his left elbow pain, acute pain in his left hand and foot and intermittent flexing of his fingers.  Plan: 1. Continue current antiretroviral regimen for now 2. Check laboratory today 3. Await rheumatology evaluation 4. Followup here in 2 weeks   Michel Bickers, MD [facility] for [place] [phone] pager   [phone] cell 02/15/2014, 2:25 PM

## 2014-02-16 LAB — RPR

## 2014-02-16 LAB — COMPREHENSIVE METABOLIC PANEL
ALBUMIN: 3.9 g/dL (ref 3.5–5.2)
ALT: 18 U/L (ref 0–53)
AST: 18 U/L (ref 0–37)
Alkaline Phosphatase: 74 U/L (ref 39–117)
BUN: 18 mg/dL (ref 6–23)
CALCIUM: 9.5 mg/dL (ref 8.4–10.5)
CHLORIDE: 102 meq/L (ref 96–112)
CO2: 25 meq/L (ref 19–32)
Creat: 1.23 mg/dL (ref 0.50–1.35)
GLUCOSE: 172 mg/dL — AB (ref 70–99)
POTASSIUM: 4.4 meq/L (ref 3.5–5.3)
SODIUM: 136 meq/L (ref 135–145)
Total Bilirubin: 1.2 mg/dL (ref 0.2–1.2)
Total Protein: 7.1 g/dL (ref 6.0–8.3)

## 2014-02-16 LAB — T-HELPER CELL (CD4) - (RCID CLINIC ONLY)
CD4 T CELL HELPER: 16 % — AB (ref 33–55)
CD4 T Cell Abs: 290 /uL — ABNORMAL LOW (ref 400–2700)

## 2014-02-16 LAB — HIV-1 RNA QUANT-NO REFLEX-BLD

## 2014-02-16 LAB — LIPID PANEL
Cholesterol: 317 mg/dL — ABNORMAL HIGH (ref 0–200)
HDL: 34 mg/dL — AB (ref >39)
TRIGLYCERIDES: 1206 mg/dL — AB (ref <150)
Total CHOL/HDL Ratio: 9.3 Ratio

## 2014-03-01 ENCOUNTER — Ambulatory Visit (INDEPENDENT_AMBULATORY_CARE_PROVIDER_SITE_OTHER): Payer: Medicaid Other | Admitting: Internal Medicine

## 2014-03-01 ENCOUNTER — Encounter: Payer: Self-pay | Admitting: Internal Medicine

## 2014-03-01 VITALS — BP 184/95 | HR 91 | Temp 98.5°F | Wt 245.0 lb

## 2014-03-01 DIAGNOSIS — K047 Periapical abscess without sinus: Secondary | ICD-10-CM | POA: Insufficient documentation

## 2014-03-01 DIAGNOSIS — B20 Human immunodeficiency virus [HIV] disease: Secondary | ICD-10-CM

## 2014-03-01 DIAGNOSIS — Z79899 Other long term (current) drug therapy: Secondary | ICD-10-CM

## 2014-03-01 DIAGNOSIS — K089 Disorder of teeth and supporting structures, unspecified: Secondary | ICD-10-CM

## 2014-03-01 MED ORDER — AMOXICILLIN-POT CLAVULANATE 875-125 MG PO TABS: 1.0000 | ORAL_TABLET | Freq: Two times a day (BID) | ORAL | Status: DC

## 2014-03-01 NOTE — Progress Notes (Addendum)
Patient ID: Patrick [name], male   DOB: 1961-02-26, 53 y.o.   MRN: [id number]          Patient Active Problem List   Diagnosis Date Noted  . 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 HIV DISEASE 01/01/2009    Priority: High  . Poor dentition 03/01/2014    Priority: Medium  . Dental abscess 03/01/2014    Priority: Medium  . Left elbow pain 01/03/2014    Priority: Medium  . Splenomegaly 10/25/2012    Priority: Medium  . Verruca 10/19/2012    Priority: Medium  . Chronic kidney disease 06/28/2012    Priority: Medium  . Diabetes, Type II 02/26/2012    Priority: Medium  . HYPERTENSION 01/01/2009    Priority: Medium  . 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 CAP (community acquired pneumonia) 12/27/2013  . Preventative health care 07/06/2013  . Bilirubinemia 05/25/2013  . Hives 12/07/2012  . Herpes labialis 10/25/2012  . Anemia 06/28/2012  . Vitamin B12 deficiency 12/03/2011  . Hypogonadism male 03/27/2011  . GOUT, UNSPECIFIED 01/01/2009  . SECONDARY HYPERPARATHYROIDISM 01/01/2009  . NEPHROLITHIASIS 01/01/2009    Patient's Medications  New Prescriptions   AMOXICILLIN-CLAVULANATE (AUGMENTIN) 875-125 MG PER TABLET    Take 1 tablet by mouth 2 (two) times daily.  Previous Medications   ABACAVIR (ZIAGEN) 300 MG TABLET    Take 2 tablets (600 mg total) by mouth daily.   ACETAMINOPHEN (TYLENOL) 325 MG TABLET    Take 2 tablets (650 mg total) by mouth every 6 (six) hours as needed (or Fever >/= 101).   ALBUTEROL (PROVENTIL HFA;VENTOLIN HFA) 108 (90 BASE) MCG/ACT INHALER    Inhale 2 puffs into the lungs every 6 (six) hours as needed for wheezing.   ASPIRIN EC 81 MG TABLET    Take 81 mg by mouth.   ATAZANAVIR (REYATAZ) 300 MG CAPSULE    Take 1 capsule (300 mg total) by mouth daily with breakfast.   BLOOD GLUCOSE MONITORING SUPPL (ACCU-CHEK NANO SMARTVIEW) W/DEVICE KIT    Check blood sugars once a day.   CYANOCOBALAMIN (,VITAMIN B-12,) 1000 MCG/ML INJECTION    Inject 1 mL  (1,000 mcg total) into the muscle every 30 (thirty) days. Takes on Monday   FERROUS SULFATE 325 (65 FE) MG TABLET    Take 325 mg by mouth.   GLUCOSE BLOOD (ACCU-CHEK SMARTVIEW) TEST STRIP    Use as instructed   HYDROCODONE-ACETAMINOPHEN (NORCO/VICODIN) 5-325 MG PER TABLET    Take 1 tablet by mouth every 6 (six) hours as needed for moderate pain.   IBUPROFEN (ADVIL,MOTRIN) 600 MG TABLET    Take 600 mg by mouth every 6 (six) hours as needed.   INSULIN GLARGINE (LANTUS SOLOSTAR) 100 UNIT/ML SOPN    Inject 15 Units into the skin daily.   INSULIN PEN NEEDLE (PEN NEEDLES 3/16") 31G X 5 MM MISC    Inject 1 Package into the skin daily.   LABETALOL (NORMODYNE) 200 MG TABLET    Take 1 tablet (200 mg total) by mouth 2 (two) times daily.   LAMIVUDINE (EPIVIR) 150 MG TABLET    Take 1 tablet (150 mg total) by mouth daily.   LANCETS MISC. (ACCU-CHEK FASTCLIX LANCET) KIT    Use as instructed.   METFORMIN (GLUCOPHAGE) 1000 MG TABLET    Take 1 tablet (1,000 mg total) by mouth 2 (two) times daily with a meal.   MOMETASONE (NASONEX) 50 MCG/ACT NASAL SPRAY    Place 2 sprays into the nose daily.  MULTIPLE VITAMIN (MULTI-VITAMINS) TABS    Take 1 tablet by mouth daily.   RITONAVIR (NORVIR) 100 MG TABS    Take 1 tablet (100 mg total) by mouth daily.   SODIUM CHLORIDE (OCEAN) 0.65 % SOLN NASAL SPRAY    Place 1 spray into both nostrils as needed for congestion.   SYRINGE, DISPOSABLE, 1 ML MISC    Inject 1 Package into the muscle every 30 (thirty) days. For B12 injections   VALACYCLOVIR (VALTREX) 500 MG TABLET    Take 1 tablet (500 mg total) by mouth 2 (two) times daily.  Modified Medications   No medications on file  Discontinued Medications   No medications on file    Subjective: Patrick [name] is in for his routine followup visit. Since his last visit here several weeks ago he was seen by a rheumatologist at [facility]. He states he had some x-rays done which were unrevealing. He is scheduled to undergo EMG studies  tomorrow. He recalls being told that he might have a pinched nerve. He is due to see his neurologist at [facility] tomorrow and also has an intake visit scheduled in the infectious disease clinic at [facility] tomorrow. One week ago he began to develop some dental pain around the right upper 2 has been broken. He has not had dental care in quite some time. A child he is at his scheduled visit in our dental clinic he has had to miss it because he has been hospitalized. He has been taking Motrin 600 mg 4 times daily to help with the pain. He has also taken some hydrocodone but does not like how it makes him feel and also because it interferes with his ability to work. He has had some swelling, pain and redness on the right side of his face. He had some fever up to 101.  Review of Systems: Constitutional: positive for fevers and malaise, negative for chills, sweats and weight loss Eyes: negative Ears, nose, mouth, throat, and face: positive for dental pain Respiratory: negative Cardiovascular: negative Gastrointestinal: negative Genitourinary:negative Musculoskeletal:positive for left shoulder and elbow pain, negative for muscle weakness  Past Medical History  Diagnosis Date  . Hypertension   . HIV (human immunodeficiency virus infection) 2010  . Hypogonadism male   . CKD (chronic kidney disease) stage 3, GFR 30-59 ml/min     Baseline Cr = 1.4  . Kidney stones   . Heart murmur   . Anemia   . Hernia   . Pancytopenia with fever 02/26/2012  . LYMPHADENOPATHY, DIFFUSE 11/18/2010  . Gallstones     History  Substance Use Topics  . Smoking status: Never Smoker   . Smokeless tobacco: Never Used  . Alcohol Use: 0.0 oz/week     Comment: 02/25/12 "couple mixed drinks or beers or glasses of wine/month"    Family History  Problem Relation Age of Onset  . Adopted: Yes    Allergies  Allergen Reactions  . Codeine Itching, Rash and Other (See Comments)    "crazy dreams"  . Sulfonamide Derivatives Rash  .  Dapsone     Objective: Temp: 98.5 F (36.9 C) (06/04 0935) Temp src: Oral (06/04 0935) BP: 184/95 mmHg (06/04 0935) Pulse Rate: 91 (06/04 0935) Body mass index is 34.19 kg/(m^2).  General: He is uncomfortable due to pain Oral: He has a right upper anterior tooth is broken off the gum line. He has some gum swelling. He has swelling and redness over his right cheek Skin: No rash Lungs: Clear Cor:  Regular S1 and S2 no murmurs Abdomen: Obese, soft nontender Joints and extremities: No objective abnormalities of his left shoulder or arm Neuro: Alert with normal speech and conversation. No focal weakness in his extremities Mood and affect: His sense of humor is still intact  Lab Results Lab Results  Component Value Date   WBC 5.3 02/15/2014   HGB 14.6 02/15/2014   HCT 41.2 02/15/2014   MCV 95.8 02/15/2014   PLT 176 02/15/2014    Lab Results  Component Value Date   CREATININE 1.23 02/15/2014   BUN 18 02/15/2014   NA 136 02/15/2014   K 4.4 02/15/2014   CL 102 02/15/2014   CO2 25 02/15/2014    Lab Results  Component Value Date   ALT 18 02/15/2014   AST 18 02/15/2014   ALKPHOS 74 02/15/2014   BILITOT 1.2 02/15/2014    Lab Results  Component Value Date   CHOL 317* 02/15/2014   HDL 34* 02/15/2014   LDLCALC NOT CALC 02/15/2014   TRIG 1206* 02/15/2014   CHOLHDL 9.3 02/15/2014    Lab Results HIV 1 RNA Quant (copies/mL)  Date Value  02/15/2014 <20   02/23/2013 <20   10/19/2012 <20      CD4 T Cell Abs (/uL)  Date Value  02/15/2014 290*  02/23/2013 380*  10/19/2012 200*     Assessment: His HIV infection remains under good control. His recent renal function has been normal and there is the option of consolidating his antiretroviral regimen. However I am reluctant to make any changes given all the other issues that he is currently facing. Also warned him that he should be very careful with NSAID use given his history of renal insufficiency.  His Castleman's disease appears to be in  remission following the treatment with rituximab.  I will start him on oral amoxicillin clavulanate for probable dental abscess and see if we can schedule him on an emergency basis for her next dental clinic on June 16.  Plan: 1. Continue current antiretroviral regimen 2. Start amoxicillin clavulanate 3. Dental clinic referral 4. Followup in 4 weeks   Michel Bickers, MD [facility] for [facility] [phone] pager   [phone] cell 03/01/2014, 9:53 AM

## 2014-04-03 ENCOUNTER — Telehealth: Payer: Self-pay | Admitting: *Deleted

## 2014-04-03 ENCOUNTER — Ambulatory Visit: Payer: Medicaid Other | Admitting: Internal Medicine

## 2014-04-03 NOTE — Telephone Encounter (Signed)
Left message requesting pt cal [facility] for appt.

## 2014-07-03 ENCOUNTER — Other Ambulatory Visit: Payer: Self-pay | Admitting: *Deleted

## 2014-07-03 NOTE — Telephone Encounter (Signed)
Sending to [employer]. For appt w/ dr Ethelene Hal asap

## 2014-07-04 ENCOUNTER — Encounter: Payer: Self-pay | Admitting: Internal Medicine

## 2014-07-04 MED ORDER — LABETALOL HCL 200 MG PO TABS: 200.0000 mg | ORAL_TABLET | Freq: Two times a day (BID) | ORAL | Status: DC

## 2014-07-12 ENCOUNTER — Other Ambulatory Visit: Payer: Self-pay | Admitting: *Deleted

## 2014-07-12 MED ORDER — CYANOCOBALAMIN 1000 MCG/ML IJ SOLN: 1000.0000 ug | INTRAMUSCULAR | Status: AC

## 2014-07-26 ENCOUNTER — Telehealth: Payer: Self-pay | Admitting: *Deleted

## 2014-07-26 NOTE — Telephone Encounter (Signed)
Please schedule for the next available [facility] resident slot for evaluation.  Thanks.

## 2014-07-26 NOTE — Telephone Encounter (Signed)
Found tick bite Tuesday, removed it Wednesday evening, he states the bite had a bullseye red area around it and was painful. He has appt mon 11/2 with dr Ethelene Hal but thinks he possibly should start some ABX now. Denies fevers, h/a's, N&V. Please advise Ph#[phone]

## 2014-07-27 NOTE — Telephone Encounter (Signed)
Called pt, there are no appts in clinic today, ask him to go to [facility] care at [address] for eval, he is agreeable

## 2014-07-30 ENCOUNTER — Ambulatory Visit: Payer: Medicaid Other | Admitting: Internal Medicine

## 2014-08-01 ENCOUNTER — Ambulatory Visit: Payer: Medicaid Other | Admitting: Internal Medicine

## 2014-08-01 ENCOUNTER — Encounter: Payer: Self-pay | Admitting: Internal Medicine

## 2014-09-15 ENCOUNTER — Encounter (HOSPITAL_COMMUNITY): Payer: Self-pay | Admitting: Emergency Medicine

## 2014-09-15 ENCOUNTER — Encounter (HOSPITAL_COMMUNITY): Payer: Self-pay | Admitting: *Deleted

## 2014-09-15 ENCOUNTER — Emergency Department (HOSPITAL_COMMUNITY): Payer: Medicaid Other

## 2014-09-15 ENCOUNTER — Emergency Department (HOSPITAL_COMMUNITY)
Admission: EM | Admit: 2014-09-15 | Discharge: 2014-09-15 | Disposition: A | Discharge status: Home / Self Care | Payer: Medicaid Other | Source: Home / Self Care | Attending: Emergency Medicine | Admitting: Emergency Medicine

## 2014-09-15 ENCOUNTER — Emergency Department (HOSPITAL_COMMUNITY)
Admission: EM | Admit: 2014-09-15 | Discharge: 2014-09-15 | Disposition: A | Discharge status: Home / Self Care | Payer: Medicaid Other | Attending: Emergency Medicine | Admitting: Emergency Medicine

## 2014-09-15 DIAGNOSIS — N183 Chronic kidney disease, stage 3 (moderate): Secondary | ICD-10-CM | POA: Diagnosis not present

## 2014-09-15 DIAGNOSIS — I129 Hypertensive chronic kidney disease with stage 1 through stage 4 chronic kidney disease, or unspecified chronic kidney disease: Secondary | ICD-10-CM | POA: Insufficient documentation

## 2014-09-15 DIAGNOSIS — J069 Acute upper respiratory infection, unspecified: Secondary | ICD-10-CM | POA: Insufficient documentation

## 2014-09-15 DIAGNOSIS — Z794 Long term (current) use of insulin: Secondary | ICD-10-CM | POA: Diagnosis not present

## 2014-09-15 DIAGNOSIS — D649 Anemia, unspecified: Secondary | ICD-10-CM | POA: Insufficient documentation

## 2014-09-15 DIAGNOSIS — Z7982 Long term (current) use of aspirin: Secondary | ICD-10-CM | POA: Diagnosis not present

## 2014-09-15 DIAGNOSIS — R011 Cardiac murmur, unspecified: Secondary | ICD-10-CM | POA: Diagnosis not present

## 2014-09-15 DIAGNOSIS — Z87442 Personal history of urinary calculi: Secondary | ICD-10-CM | POA: Diagnosis not present

## 2014-09-15 DIAGNOSIS — Z21 Asymptomatic human immunodeficiency virus [HIV] infection status: Secondary | ICD-10-CM | POA: Insufficient documentation

## 2014-09-15 DIAGNOSIS — Z792 Long term (current) use of antibiotics: Secondary | ICD-10-CM | POA: Insufficient documentation

## 2014-09-15 DIAGNOSIS — Z79899 Other long term (current) drug therapy: Secondary | ICD-10-CM | POA: Insufficient documentation

## 2014-09-15 DIAGNOSIS — Z7951 Long term (current) use of inhaled steroids: Secondary | ICD-10-CM | POA: Diagnosis not present

## 2014-09-15 DIAGNOSIS — R05 Cough: Secondary | ICD-10-CM | POA: Diagnosis present

## 2014-09-15 LAB — CBC WITH DIFFERENTIAL/PLATELET
Basophils Absolute: 0 10*3/uL (ref 0.0–0.1)
Basophils Relative: 0 % (ref 0–1)
Eosinophils Absolute: 0.2 10*3/uL (ref 0.0–0.7)
Eosinophils Relative: 3 % (ref 0–5)
HCT: 41.4 % (ref 39.0–52.0)
Hemoglobin: 14.1 g/dL (ref 13.0–17.0)
Lymphocytes Relative: 34 % (ref 12–46)
Lymphs Abs: 2 10*3/uL (ref 0.7–4.0)
MCH: 34.2 pg — ABNORMAL HIGH (ref 26.0–34.0)
MCHC: 34.1 g/dL (ref 30.0–36.0)
MCV: 100.5 fL — ABNORMAL HIGH (ref 78.0–100.0)
Monocytes Absolute: 0.5 10*3/uL (ref 0.1–1.0)
Monocytes Relative: 8 % (ref 3–12)
NEUTROS ABS: 3.3 10*3/uL (ref 1.7–7.7)
Neutrophils Relative %: 55 % (ref 43–77)
Platelets: 156 10*3/uL (ref 150–400)
RBC: 4.12 MIL/uL — ABNORMAL LOW (ref 4.22–5.81)
RDW: 13.3 % (ref 11.5–15.5)
WBC: 6 10*3/uL (ref 4.0–10.5)

## 2014-09-15 LAB — BASIC METABOLIC PANEL
Anion gap: 13 (ref 5–15)
BUN: 14 mg/dL (ref 6–23)
CO2: 25 meq/L (ref 19–32)
Calcium: 9.4 mg/dL (ref 8.4–10.5)
Chloride: 95 mEq/L — ABNORMAL LOW (ref 96–112)
Creatinine, Ser: 1.07 mg/dL (ref 0.50–1.35)
GFR calc Af Amer: 90 mL/min — ABNORMAL LOW (ref >90)
GFR, EST NON AFRICAN AMERICAN: 77 mL/min — AB (ref >90)
GLUCOSE: 184 mg/dL — AB (ref 70–99)
Potassium: 4.5 mEq/L (ref 3.7–5.3)
SODIUM: 133 meq/L — AB (ref 137–147)

## 2014-09-15 LAB — I-STAT CG4 LACTIC ACID, ED: LACTIC ACID, VENOUS: 1.4 mmol/L (ref 0.5–2.2)

## 2014-09-15 LAB — POCT RAPID STREP A: Streptococcus, Group A Screen (Direct): NEGATIVE

## 2014-09-15 MED ORDER — SODIUM CHLORIDE 0.9 % IV BOLUS (SEPSIS)
1000.0000 mL | Freq: Once | INTRAVENOUS | Status: AC
  Administered 2014-09-15: 1000 mL via INTRAVENOUS

## 2014-09-15 MED ORDER — SALINE SPRAY 0.65 % NA SOLN: 1.0000 | NASAL | Status: AC | PRN

## 2014-09-15 MED ORDER — GUAIFENESIN 100 MG/5ML PO LIQD: 100.0000 mg | ORAL | Status: AC | PRN

## 2014-09-15 MED ORDER — GUAIFENESIN 100 MG/5ML PO SOLN
5.0000 mL | Freq: Once | ORAL | Status: AC
  Administered 2014-09-15: 100 mg via ORAL
  Filled 2014-09-15: qty 5

## 2014-09-15 NOTE — ED Provider Notes (Signed)
CSN: [id number]     Arrival date & time 09/15/14  1951 History  This chart was scribed for non-physician practitioner working with Pamella Pert, MD by Molli Posey, ED Scribe. This patient was seen in room TR06C/TR06C and the patient's care was started at 8:43 PM.    Chief Complaint  Patient presents with  . URI   The history is provided by the patient. No language interpreter was used.   HPI Comments: Jacier Gladu is a 53 y.o. male with a history of HIV, HTN, and CKD who presents to the Emergency Department complaining of a URI that started 4 days ago. Pt complains of congestion, headache, and an ear ache. Pt states he has had a fever but is unsure what the maximum temperature was. Pt states his CD4 counts have been under control and says his HIV is managed in [place]. Pt states he has taken sudafed and Mucinex which has provided minimal relief to his symptoms. Pt reports no modifying factors at this time. He reports he is allergic to codeine, sulfonamide derivatives and dapsone.     Past Medical History  Diagnosis Date  . Hypertension   . HIV (human immunodeficiency virus infection) 2010  . Hypogonadism male   . CKD (chronic kidney disease) stage 3, GFR 30-59 ml/min     Baseline Cr = 1.4  . Kidney stones   . Heart murmur   . Anemia   . Hernia   . Pancytopenia with fever 02/26/2012  . LYMPHADENOPATHY, DIFFUSE 11/18/2010  . Gallstones    Past Surgical History  Procedure Laterality Date  . Lithotripsy  2010  . Appendectomy  2001    via midline incision  . Axillary lymph node dissection  2011; 2012    left   Family History  Problem Relation Age of Onset  . Adopted: Yes   History  Substance Use Topics  . Smoking status: Never Smoker   . Smokeless tobacco: Never Used  . Alcohol Use: 0.0 oz/week     Comment: 02/25/12 "couple mixed drinks or beers or glasses of wine/month"    Review of Systems  Constitutional: Negative for fever.  HENT: Positive for congestion  and ear pain.   Neurological: Positive for headaches.  All other systems reviewed and are negative.   Allergies  Codeine; Sulfonamide derivatives; and Dapsone  Home Medications   Prior to Admission medications   Medication Sig Start Date End Date Taking? Authorizing Provider  abacavir (ZIAGEN) 300 MG tablet Take 2 tablets (600 mg total) by mouth daily. 07/12/12  Yes Michel Bickers, MD  acetaminophen (TYLENOL) 325 MG tablet Take 2 tablets (650 mg total) by mouth every 6 (six) hours as needed (or Fever >/= 101). 06/23/12  Yes Jessee Avers, MD  albuterol (PROVENTIL HFA;VENTOLIN HFA) 108 (90 BASE) MCG/ACT inhaler Inhale 2 puffs into the lungs every 6 (six) hours as needed for wheezing. 09/29/13  Yes Hester Mates, MD  aspirin EC 81 MG tablet Take 81 mg by mouth. 12/05/12  Yes Historical Provider, MD  atazanavir (REYATAZ) 300 MG capsule Take 1 capsule (300 mg total) by mouth daily with breakfast. 07/12/12  Yes Michel Bickers, MD  cyanocobalamin (,VITAMIN B-12,) 1000 MCG/ML injection Inject 1 mL (1,000 mcg total) into the muscle every 30 (thirty) days. Takes on Monday 07/12/14  Yes Kelby Aline, MD  ferrous sulfate 325 (65 FE) MG tablet Take 325 mg by mouth. 12/05/12  Yes Historical Provider, MD  HYDROcodone-acetaminophen (NORCO/VICODIN) 5-325 MG per tablet Take 1  tablet by mouth every 6 (six) hours as needed for moderate pain.   Yes Historical Provider, MD  ibuprofen (ADVIL,MOTRIN) 600 MG tablet Take 600 mg by mouth every 6 (six) hours as needed for moderate pain.    Yes Historical Provider, MD  Insulin Glargine (LANTUS SOLOSTAR) 100 UNIT/ML SOPN Inject 15 Units into the skin daily. 07/12/13  Yes Hester Mates, MD  labetalol (NORMODYNE) 200 MG tablet Take 1 tablet (200 mg total) by mouth 2 (two) times daily. 07/04/14  Yes Kelby Aline, MD  lamiVUDine (EPIVIR) 150 MG tablet Take 1 tablet (150 mg total) by mouth daily. 07/12/12  Yes Michel Bickers, MD  metFORMIN (GLUCOPHAGE) 1000 MG tablet Take 1 tablet  (1,000 mg total) by mouth 2 (two) times daily with a meal. 08/29/13  Yes Hester Mates, MD  mometasone (NASONEX) 50 MCG/ACT nasal spray Place 2 sprays into the nose daily.   Yes Historical Provider, MD  Multiple Vitamin (MULTI-VITAMINS) TABS Take 1 tablet by mouth daily. 12/05/12  Yes Historical Provider, MD  ritonavir (NORVIR) 100 MG TABS Take 1 tablet (100 mg total) by mouth daily. 07/12/12  Yes Michel Bickers, MD  sodium chloride (OCEAN) 0.65 % SOLN nasal spray Place 1 spray into both nostrils as needed for congestion. 11/22/13  Yes Jessee Avers, MD  valACYclovir (VALTREX) 500 MG tablet Take 1 tablet (500 mg total) by mouth 2 (two) times daily. 10/25/12  Yes Michel Bickers, MD  amoxicillin-clavulanate (AUGMENTIN) 875-125 MG per tablet Take 1 tablet by mouth 2 (two) times daily. Patient not taking: Reported on 09/15/2014 03/01/14   Michel Bickers, MD  Blood Glucose Monitoring Suppl (ACCU-CHEK NANO SMARTVIEW) W/DEVICE KIT Check blood sugars once a day. 01/09/14   Malena Catholic, MD  glucose blood (ACCU-CHEK SMARTVIEW) test strip Use as instructed 01/09/14   Malena Catholic, MD  guaiFENesin (ROBITUSSIN) 100 MG/5ML liquid Take 5-10 mLs (100-200 mg total) by mouth every 4 (four) hours as needed for cough. 09/15/14   Allah Reason L Destynie Toomey, PA-C  Insulin Pen Needle (PEN NEEDLES 3/16") 31G X 5 MM MISC Inject 1 Package into the skin daily. 07/12/13   Hester Mates, MD  Lancets Misc. (ACCU-CHEK FASTCLIX LANCET) KIT Use as instructed. 01/09/14   Malena Catholic, MD  sodium chloride (OCEAN) 0.65 % SOLN nasal spray Place 1 spray into both nostrils as needed for congestion. 09/15/14   Chanee Henrickson L Matthias Bogus, PA-C  Syringe, Disposable, 1 ML MISC Inject 1 Package into the muscle every 30 (thirty) days. For B12 injections 07/12/13   Hester Mates, MD   BP 161/87 mmHg  Pulse 84  Temp(Src) 98.3 F (36.8 C) (Oral)  Resp 18  Ht 6' (1.829 m)  Wt 245 lb (111.131 kg)  BMI 33.22 kg/m2  SpO2  98% Physical Exam  Constitutional: He is oriented to person, place, and time. He appears well-developed and well-nourished. No distress.  HENT:  Head: Normocephalic and atraumatic.  Right Ear: Hearing, tympanic membrane, external ear and ear canal normal.  Left Ear: Hearing, tympanic membrane, external ear and ear canal normal.  Nose: Rhinorrhea present.  Mouth/Throat: Uvula is midline, oropharynx is clear and moist and mucous membranes are normal.  Eyes: Conjunctivae are normal.  Neck: Normal range of motion. Neck supple.  Cardiovascular: Normal rate, regular rhythm and normal heart sounds.   Pulmonary/Chest: Effort normal and breath sounds normal. No respiratory distress. He has no wheezes. He has no rales.  Abdominal: Soft.  Musculoskeletal: Normal range of motion.  Neurological: He is alert and oriented to person, place, and time.  Skin:  Cool clammy  Psychiatric: He has a normal mood and affect.  Nursing note and vitals reviewed.   ED Course  Procedures   Medications  guaiFENesin (ROBITUSSIN) 100 MG/5ML solution 100 mg (100 mg Oral Given 09/15/14 2202)  sodium chloride 0.9 % bolus 1,000 mL (0 mLs Intravenous Stopped 09/15/14 2314)    DIAGNOSTIC STUDIES: Oxygen Saturation is 99% on RA, normal by my interpretation.    COORDINATION OF CARE: 8:45 PM Discussed treatment plan with pt at bedside and pt agreed to plan.   Labs Review Labs Reviewed  CBC WITH DIFFERENTIAL - Abnormal; Notable for the following:    RBC 4.12 (*)    MCV 100.5 (*)    MCH 34.2 (*)    All other components within normal limits  BASIC METABOLIC PANEL - Abnormal; Notable for the following:    Sodium 133 (*)    Chloride 95 (*)    Glucose, Bld 184 (*)    GFR calc non Af Amer 77 (*)    GFR calc Af Amer 90 (*)    All other components within normal limits  INFLUENZA PANEL BY PCR (TYPE A & B, H1N1)  I-STAT CG4 LACTIC ACID, ED    Imaging Review Dg Chest 2 View  09/15/2014   CLINICAL DATA:  Acute  onset of cough, congestion, shortness of breath and fever for 4 days. Initial encounter.  EXAM: CHEST  2 VIEW  COMPARISON:  Chest radiograph performed 08/20/2012  FINDINGS: The lungs are well-aerated and clear. There is no evidence of focal opacification, pleural effusion or pneumothorax.  The heart is normal in size; the mediastinal contour is within normal limits. No acute osseous abnormalities are seen.  IMPRESSION: No acute cardiopulmonary process seen.   Electronically Signed   By: Garald Balding M.D.   On: 09/15/2014 21:41     EKG Interpretation None      Patient endorses improvement after IVF bolus. Skin is pink and dry.   MDM   Final diagnoses:  URI (upper respiratory infection)   Filed Vitals:   09/15/14 2148  BP: 161/87  Pulse: 84  Temp: 98.3 F (36.8 C)  Resp: 18   Afebrile, NAD, non-toxic appearing, AAOx4.  I have reviewed nursing notes, vital signs, and all appropriate lab and imaging results for this patient.  Pt CXR negative for acute infiltrate. Influenza sent. Patients symptoms are consistent with URI, likely viral etiology. Discussed that antibiotics are not indicated for viral infections. Pt will be discharged with symptomatic treatment.  Verbalizes understanding and is agreeable with plan. Pt is hemodynamically stable & in NAD prior to dc. Patient is stable at time of discharge   I personally performed the services described in this documentation, which was scribed in my presence. The recorded information has been reviewed and is accurate.       Harlow Mares, PA-C 09/16/14 0038  Pamella Pert, MD 09/16/14 1204

## 2014-09-15 NOTE — ED Notes (Signed)
C/o cold sx onset 3 days Sx include hoarseness, fevers, ST, HA, bilateral ear fullness Taking OTC cold meds w/no relief Alert, no signs of acute distress.

## 2014-09-15 NOTE — ED Provider Notes (Signed)
CSN: [id number]     Arrival date & time 09/15/14  1826 History   First MD Initiated Contact with Patient 09/15/14 1903     Chief Complaint  Patient presents with  . URI   (Consider location/radiation/quality/duration/timing/severity/associated sxs/prior Treatment) HPI Comments: +HIV. Reports viral load to be undetectable.  PCP: [facility]  Patient is a 53 y.o. male presenting with URI. The history is provided by the patient.  URI Presenting symptoms: congestion, cough, fever, rhinorrhea and sore throat   Presenting symptoms: no ear pain, no facial pain and no fatigue   Severity:  Moderate Onset quality:  Gradual Duration:  3 days Timing:  Constant Progression:  Worsening Chronicity:  New Associated symptoms: headaches   Associated symptoms: no arthralgias, no myalgias, no neck pain, no sinus pain, no sneezing, no swollen glands and no wheezing   Risk factors: immunosuppression     Past Medical History  Diagnosis Date  . Hypertension   . HIV (human immunodeficiency virus infection) 2010  . Hypogonadism male   . CKD (chronic kidney disease) stage 3, GFR 30-59 ml/min     Baseline Cr = 1.4  . Kidney stones   . Heart murmur   . Anemia   . Hernia   . Pancytopenia with fever 02/26/2012  . LYMPHADENOPATHY, DIFFUSE 11/18/2010  . Gallstones    Past Surgical History  Procedure Laterality Date  . Lithotripsy  2010  . Appendectomy  2001    via midline incision  . Axillary lymph node dissection  2011; 2012    left   Family History  Problem Relation Age of Onset  . Adopted: Yes   History  Substance Use Topics  . Smoking status: Never Smoker   . Smokeless tobacco: Never Used  . Alcohol Use: 0.0 oz/week     Comment: 02/25/12 "couple mixed drinks or beers or glasses of wine/month"    Review of Systems  Constitutional: Positive for fever. Negative for fatigue.  HENT: Positive for congestion, rhinorrhea and sore throat. Negative for ear pain and sneezing.   Eyes: Negative.    Respiratory: Positive for cough and chest tightness. Negative for shortness of breath and wheezing.   Cardiovascular: Negative.   Gastrointestinal: Negative.   Musculoskeletal: Negative for myalgias, arthralgias and neck pain.  Neurological: Positive for headaches.    Allergies  Codeine; Sulfonamide derivatives; and Dapsone  Home Medications   Prior to Admission medications   Medication Sig Start Date End Date Taking? Authorizing Provider  atazanavir (REYATAZ) 300 MG capsule Take 1 capsule (300 mg total) by mouth daily with breakfast. 07/12/12  Yes Michel Bickers, MD  Insulin Glargine (LANTUS SOLOSTAR) 100 UNIT/ML SOPN Inject 15 Units into the skin daily. 07/12/13  Yes Hester Mates, MD  metFORMIN (GLUCOPHAGE) 1000 MG tablet Take 1 tablet (1,000 mg total) by mouth 2 (two) times daily with a meal. 08/29/13  Yes Hester Mates, MD  ritonavir (NORVIR) 100 MG TABS Take 1 tablet (100 mg total) by mouth daily. 07/12/12  Yes Michel Bickers, MD  abacavir (ZIAGEN) 300 MG tablet Take 2 tablets (600 mg total) by mouth daily. 07/12/12   Michel Bickers, MD  acetaminophen (TYLENOL) 325 MG tablet Take 2 tablets (650 mg total) by mouth every 6 (six) hours as needed (or Fever >/= 101). 06/23/12   Jessee Avers, MD  albuterol (PROVENTIL HFA;VENTOLIN HFA) 108 (90 BASE) MCG/ACT inhaler Inhale 2 puffs into the lungs every 6 (six) hours as needed for wheezing. 09/29/13   Hester Mates, MD  amoxicillin-clavulanate (AUGMENTIN) 875-125 MG per tablet Take 1 tablet by mouth 2 (two) times daily. 03/01/14   Michel Bickers, MD  aspirin EC 81 MG tablet Take 81 mg by mouth. 12/05/12   Historical Provider, MD  Blood Glucose Monitoring Suppl (ACCU-CHEK NANO SMARTVIEW) W/DEVICE KIT Check blood sugars once a day. 01/09/14   Malena Catholic, MD  cyanocobalamin (,VITAMIN B-12,) 1000 MCG/ML injection Inject 1 mL (1,000 mcg total) into the muscle every 30 (thirty) days. Takes on Monday 07/12/14   Kelby Aline, MD  ferrous sulfate 325  (65 FE) MG tablet Take 325 mg by mouth. 12/05/12   Historical Provider, MD  glucose blood (ACCU-CHEK SMARTVIEW) test strip Use as instructed 01/09/14   Malena Catholic, MD  HYDROcodone-acetaminophen (NORCO/VICODIN) 5-325 MG per tablet Take 1 tablet by mouth every 6 (six) hours as needed for moderate pain.    Historical Provider, MD  ibuprofen (ADVIL,MOTRIN) 600 MG tablet Take 600 mg by mouth every 6 (six) hours as needed.    Historical Provider, MD  Insulin Pen Needle (PEN NEEDLES 3/16") 31G X 5 MM MISC Inject 1 Package into the skin daily. 07/12/13   Hester Mates, MD  labetalol (NORMODYNE) 200 MG tablet Take 1 tablet (200 mg total) by mouth 2 (two) times daily. 07/04/14   Kelby Aline, MD  lamiVUDine (EPIVIR) 150 MG tablet Take 1 tablet (150 mg total) by mouth daily. 07/12/12   Michel Bickers, MD  Lancets Misc. (ACCU-CHEK FASTCLIX LANCET) KIT Use as instructed. 01/09/14   Malena Catholic, MD  mometasone (NASONEX) 50 MCG/ACT nasal spray Place 2 sprays into the nose daily.    Historical Provider, MD  Multiple Vitamin (MULTI-VITAMINS) TABS Take 1 tablet by mouth daily. 12/05/12   Historical Provider, MD  sodium chloride (OCEAN) 0.65 % SOLN nasal spray Place 1 spray into both nostrils as needed for congestion. 11/22/13   Jessee Avers, MD  Syringe, Disposable, 1 ML MISC Inject 1 Package into the muscle every 30 (thirty) days. For B12 injections 07/12/13   Hester Mates, MD  valACYclovir (VALTREX) 500 MG tablet Take 1 tablet (500 mg total) by mouth 2 (two) times daily. 10/25/12   Michel Bickers, MD   BP 144/85 mmHg  Pulse 87  Temp(Src) 98.5 F (36.9 C) (Oral)  Resp 16  SpO2 95% Physical Exam  Constitutional: He is oriented to person, place, and time. He appears well-developed and well-nourished. No distress.  HENT:  Head: Normocephalic and atraumatic.  Right Ear: Hearing, tympanic membrane, external ear and ear canal normal.  Left Ear: Hearing, tympanic membrane, external ear and ear  canal normal.  Nose: Nose normal.  Mouth/Throat: Uvula is midline, oropharynx is clear and moist and mucous membranes are normal.  Eyes: Conjunctivae are normal. No scleral icterus.  Neck: Normal range of motion. Neck supple.  Cardiovascular: Normal rate, regular rhythm and normal heart sounds.   Pulmonary/Chest: Effort normal and breath sounds normal. No respiratory distress. He has no decreased breath sounds. He has no wheezes. He has no rhonchi.  Musculoskeletal: Normal range of motion.  Lymphadenopathy:    He has no cervical adenopathy.  Neurological: He is alert and oriented to person, place, and time.  Skin: Skin is warm and dry. No rash noted. No erythema.  Psychiatric: He has a normal mood and affect. His behavior is normal.  Nursing note and vitals reviewed.   ED Course  Procedures (including critical care time) Labs Review Labs Reviewed  POCT RAPID STREP A (  [facility] CARE ONLY)    Imaging Review No results found.   MDM   1. URI (upper respiratory infection)    53 Y/O +HIV male with 3 days of URI sx. Afebrile without hypoxia. Patient expresses concern for pneumonia. Chest radiograph services not available at [facility] temporary location. Patient to be discharged with instruction to report to [facility] ER for further evaluation.     Lutricia Feil, [place] 09/15/14 [phone]

## 2014-09-15 NOTE — Discharge Instructions (Signed)
Please follow up with your primary care physician in 1-2 days. If you do not have one please call the [place] number listed above. Please take medications as prescribed. Please read all discharge instructions and return precautions.    Upper Respiratory Infection, Adult An upper respiratory infection (URI) is also sometimes known as the common cold. The upper respiratory tract includes the nose, sinuses, throat, trachea, and bronchi. Bronchi are the airways leading to the lungs. Most people improve within 1 week, but symptoms can last up to 2 weeks. A residual cough may last even longer.  CAUSES Many different viruses can infect the tissues lining the upper respiratory tract. The tissues become irritated and inflamed and often become very moist. Mucus production is also common. A cold is contagious. You can easily spread the virus to others by oral contact. This includes kissing, sharing a glass, coughing, or sneezing. Touching your mouth or nose and then touching a surface, which is then touched by another person, can also spread the virus. SYMPTOMS  Symptoms typically develop 1 to 3 days after you come in contact with a cold virus. Symptoms vary from person to person. They may include:  Runny nose.  Sneezing.  Nasal congestion.  Sinus irritation.  Sore throat.  Loss of voice (laryngitis).  Cough.  Fatigue.  Muscle aches.  Loss of appetite.  Headache.  Low-grade fever. DIAGNOSIS  You might diagnose your own cold based on familiar symptoms, since most people get a cold 2 to 3 times a year. Your caregiver can confirm this based on your exam. Most importantly, your caregiver can check that your symptoms are not due to another disease such as strep throat, sinusitis, pneumonia, asthma, or epiglottitis. Blood tests, throat tests, and X-rays are not necessary to diagnose a common cold, but they may sometimes be helpful in excluding other more serious diseases.  Your caregiver will decide if any further tests are required. RISKS AND COMPLICATIONS  You may be at risk for a more severe case of the common cold if you smoke cigarettes, have chronic heart disease (such as heart failure) or lung disease (such as asthma), or if you have a weakened immune system. The very young and very old are also at risk for more serious infections. Bacterial sinusitis, middle ear infections, and bacterial pneumonia can complicate the common cold. The common cold can worsen asthma and chronic obstructive pulmonary disease (COPD). Sometimes, these complications can require emergency medical care and may be life-threatening. PREVENTION  The best way to protect against getting a cold is to practice good hygiene. Avoid oral or hand contact with people with cold symptoms. Wash your hands often if contact occurs. There is no clear evidence that vitamin C, vitamin E, echinacea, or exercise reduces the chance of developing a cold. However, it is always recommended to get plenty of rest and practice good nutrition. TREATMENT  Treatment is directed at relieving symptoms. There is no cure. Antibiotics are not effective, because the infection is caused by a virus, not by bacteria. Treatment may include:  Increased fluid intake. Sports drinks offer valuable electrolytes, sugars, and fluids.  Breathing heated mist or steam (vaporizer or shower).  Eating chicken soup or other clear broths, and maintaining good nutrition.  Getting plenty of rest.  Using gargles or lozenges for comfort.  Controlling fevers with ibuprofen or acetaminophen as directed by your caregiver.  Increasing usage of your inhaler if you have asthma. Zinc gel and zinc lozenges, taken in  the first 24 hours of the common cold, can shorten the duration and lessen the severity of symptoms. Pain medicines may help with fever, muscle aches, and throat pain. A variety of non-prescription medicines are available to treat  congestion and runny nose. Your caregiver can make recommendations and may suggest nasal or lung inhalers for other symptoms.  HOME CARE INSTRUCTIONS   Only take over-the-counter or prescription medicines for pain, discomfort, or fever as directed by your caregiver.  Use a warm mist humidifier or inhale steam from a shower to increase air moisture. This may keep secretions moist and make it easier to breathe.  Drink enough water and fluids to keep your urine clear or pale yellow.  Rest as needed.  Return to work when your temperature has returned to normal or as your caregiver advises. You may need to stay home longer to avoid infecting others. You can also use a face mask and careful hand washing to prevent spread of the virus. SEEK MEDICAL CARE IF:   After the first few days, you feel you are getting worse rather than better.  You need your caregiver's advice about medicines to control symptoms.  You develop chills, worsening shortness of breath, or brown or red sputum. These may be signs of pneumonia.  You develop yellow or brown nasal discharge or pain in the face, especially when you bend forward. These may be signs of sinusitis.  You develop a fever, swollen neck glands, pain with swallowing, or white areas in the back of your throat. These may be signs of strep throat. SEEK IMMEDIATE MEDICAL CARE IF:   You have a fever.  You develop severe or persistent headache, ear pain, sinus pain, or chest pain.  You develop wheezing, a prolonged cough, cough up blood, or have a change in your usual mucus (if you have chronic lung disease).  You develop sore muscles or a stiff neck. Document Released: 03/10/2001 Document Revised: 12/07/2011 Document Reviewed: 12/20/2013 [facility] Patient Information 2015 [place], [place]. This information is not intended to replace advice given to you by your health care provider. Make sure you discuss any questions you have with your health care  provider.

## 2014-09-15 NOTE — Discharge Instructions (Signed)
It is my advice that you would benefit from a chest xray tonight to determine whether or not you have a respiratory infection requiring treatment with antibiotics. Please go directly to [facility] ER for evaluation upon leaving the [facility] Upper Respiratory Infection, Adult An upper respiratory infection (URI) is also sometimes known as the common cold. The upper respiratory tract includes the nose, sinuses, throat, trachea, and bronchi. Bronchi are the airways leading to the lungs. Most people improve within 1 week, but symptoms can last up to 2 weeks. A residual cough may last even longer.  CAUSES Many different viruses can infect the tissues lining the upper respiratory tract. The tissues become irritated and inflamed and often become very moist. Mucus production is also common. A cold is contagious. You can easily spread the virus to others by oral contact. This includes kissing, sharing a glass, coughing, or sneezing. Touching your mouth or nose and then touching a surface, which is then touched by another person, can also spread the virus. SYMPTOMS  Symptoms typically develop 1 to 3 days after you come in contact with a cold virus. Symptoms vary from person to person. They may include:  Runny nose.  Sneezing.  Nasal congestion.  Sinus irritation.  Sore throat.  Loss of voice (laryngitis).  Cough.  Fatigue.  Muscle aches.  Loss of appetite.  Headache.  Low-grade fever. DIAGNOSIS  You might diagnose your own cold based on familiar symptoms, since most people get a cold 2 to 3 times a year. Your caregiver can confirm this based on your exam. Most importantly, your caregiver can check that your symptoms are not due to another disease such as strep throat, sinusitis, pneumonia, asthma, or epiglottitis. Blood tests, throat tests, and X-rays are not necessary to diagnose a common cold, but they may sometimes be helpful in excluding other more serious diseases. Your caregiver will decide if  any further tests are required. RISKS AND COMPLICATIONS  You may be at risk for a more severe case of the common cold if you smoke cigarettes, have chronic heart disease (such as heart failure) or lung disease (such as asthma), or if you have a weakened immune system. The very young and very old are also at risk for more serious infections. Bacterial sinusitis, middle ear infections, and bacterial pneumonia can complicate the common cold. The common cold can worsen asthma and chronic obstructive pulmonary disease (COPD). Sometimes, these complications can require emergency medical care and may be life-threatening. PREVENTION  The best way to protect against getting a cold is to practice good hygiene. Avoid oral or hand contact with people with cold symptoms. Wash your hands often if contact occurs. There is no clear evidence that vitamin C, vitamin E, echinacea, or exercise reduces the chance of developing a cold. However, it is always recommended to get plenty of rest and practice good nutrition. TREATMENT  Treatment is directed at relieving symptoms. There is no cure. Antibiotics are not effective, because the infection is caused by a virus, not by bacteria. Treatment may include:  Increased fluid intake. Sports drinks offer valuable electrolytes, sugars, and fluids.  Breathing heated mist or steam (vaporizer or shower).  Eating chicken soup or other clear broths, and maintaining good nutrition.  Getting plenty of rest.  Using gargles or lozenges for comfort.  Controlling fevers with ibuprofen or acetaminophen as directed by your caregiver.  Increasing usage of your inhaler if you have asthma. Zinc gel and zinc lozenges, taken in the first 24 hours  of the common cold, can shorten the duration and lessen the severity of symptoms. Pain medicines may help with fever, muscle aches, and throat pain. A variety of non-prescription medicines are available to treat congestion and runny nose. Your  caregiver can make recommendations and may suggest nasal or lung inhalers for other symptoms.  HOME CARE INSTRUCTIONS   Only take over-the-counter or prescription medicines for pain, discomfort, or fever as directed by your caregiver.  Use a warm mist humidifier or inhale steam from a shower to increase air moisture. This may keep secretions moist and make it easier to breathe.  Drink enough water and fluids to keep your urine clear or pale yellow.  Rest as needed.  Return to work when your temperature has returned to normal or as your caregiver advises. You may need to stay home longer to avoid infecting others. You can also use a face mask and careful hand washing to prevent spread of the virus. SEEK MEDICAL CARE IF:   After the first few days, you feel you are getting worse rather than better.  You need your caregiver's advice about medicines to control symptoms.  You develop chills, worsening shortness of breath, or brown or red sputum. These may be signs of pneumonia.  You develop yellow or brown nasal discharge or pain in the face, especially when you bend forward. These may be signs of sinusitis.  You develop a fever, swollen neck glands, pain with swallowing, or white areas in the back of your throat. These may be signs of strep throat. SEEK IMMEDIATE MEDICAL CARE IF:   You have a fever.  You develop severe or persistent headache, ear pain, sinus pain, or chest pain.  You develop wheezing, a prolonged cough, cough up blood, or have a change in your usual mucus (if you have chronic lung disease).  You develop sore muscles or a stiff neck. Document Released: 03/10/2001 Document Revised: 12/07/2011 Document Reviewed: 12/20/2013 [facility] Patient Information 2015 [place], [place]. This information is not intended to replace advice given to you by your health care provider. Make sure you discuss any questions you have with your health care provider.

## 2014-09-15 NOTE — ED Notes (Signed)
Patient presents with c/o cold symptoms since Wednesday

## 2014-09-16 LAB — INFLUENZA PANEL BY PCR (TYPE A & B)
H1N1FLUPCR: NOT DETECTED
INFLAPCR: NEGATIVE
Influenza B By PCR: NEGATIVE

## 2014-09-18 LAB — CULTURE, GROUP A STREP

## 2014-10-10 ENCOUNTER — Other Ambulatory Visit: Payer: Self-pay | Admitting: *Deleted

## 2014-10-10 MED ORDER — ALBUTEROL SULFATE HFA 108 (90 BASE) MCG/ACT IN AERS: 2.0000 | INHALATION_SPRAY | Freq: Four times a day (QID) | RESPIRATORY_TRACT | Status: DC | PRN

## 2014-10-26 ENCOUNTER — Other Ambulatory Visit: Payer: Self-pay | Admitting: *Deleted

## 2014-10-26 MED ORDER — INSULIN GLARGINE 100 UNIT/ML SOLOSTAR PEN: 15.0000 [IU] | PEN_INJECTOR | Freq: Every day | SUBCUTANEOUS | Status: DC

## 2014-12-25 ENCOUNTER — Ambulatory Visit (INDEPENDENT_AMBULATORY_CARE_PROVIDER_SITE_OTHER): Payer: Medicaid Other | Admitting: Internal Medicine

## 2014-12-25 ENCOUNTER — Encounter: Payer: Self-pay | Admitting: Internal Medicine

## 2014-12-25 VITALS — BP 158/93 | HR 78 | Temp 97.6°F | Ht 72.0 in | Wt 246.3 lb

## 2014-12-25 DIAGNOSIS — F32A Depression, unspecified: Secondary | ICD-10-CM

## 2014-12-25 DIAGNOSIS — D47Z2 Castleman disease: Secondary | ICD-10-CM

## 2014-12-25 DIAGNOSIS — I10 Essential (primary) hypertension: Secondary | ICD-10-CM

## 2014-12-25 DIAGNOSIS — R599 Enlarged lymph nodes, unspecified: Secondary | ICD-10-CM

## 2014-12-25 DIAGNOSIS — E139 Other specified diabetes mellitus without complications: Secondary | ICD-10-CM

## 2014-12-25 DIAGNOSIS — B20 Human immunodeficiency virus [HIV] disease: Secondary | ICD-10-CM

## 2014-12-25 DIAGNOSIS — F329 Major depressive disorder, single episode, unspecified: Secondary | ICD-10-CM

## 2014-12-25 LAB — POCT GLYCOSYLATED HEMOGLOBIN (HGB A1C): Hemoglobin A1C: 7.7

## 2014-12-25 LAB — GLUCOSE, CAPILLARY
GLUCOSE-CAPILLARY: 219 mg/dL — AB (ref 70–99)
Glucose-Capillary: 210 mg/dL — ABNORMAL HIGH (ref 70–99)

## 2014-12-25 MED ORDER — FLUOXETINE HCL 20 MG PO CAPS: 20.0000 mg | ORAL_CAPSULE | Freq: Every day | ORAL | Status: DC

## 2014-12-25 MED ORDER — LISINOPRIL 10 MG PO TABS: 10.0000 mg | ORAL_TABLET | Freq: Every day | ORAL | Status: AC

## 2014-12-25 NOTE — Assessment & Plan Note (Signed)
Last seen last summer by Dr Megan Salon. 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 He believes his last CD4 was 320 and RNA undetectable. -encouraged to keep ID appointment at [facility] -continue their HAART regimen

## 2014-12-25 NOTE — Assessment & Plan Note (Signed)
Lab Results  Component Value Date   HGBA1C 7.7 12/25/2014   HGBA1C 7.0 11/22/2013   HGBA1C 6.8 07/06/2013     Assessment: Diabetes control: fair control Progress toward A1C goal:  deteriorated Comments: A1c up at 7.7 from 7.0 a year ago. He is on lantus 15 u daily and metformin 1000 mg bid  Plan: Medications:  continue current medications Home glucose monitoring: Frequency:   Timing:   Instruction/counseling given: reminded to bring blood glucose meter & log to each visit and reminded to bring medications to each visit Educational resources provided: brochure (denies) Self management tools provided:   Other plans: Will not make any changes today. Will treat depression and see if this helps with medicine compliance and diet.

## 2014-12-25 NOTE — Assessment & Plan Note (Addendum)
BP Readings from Last 3 Encounters:  12/25/14 158/93  09/15/14 161/87  09/15/14 144/85    Lab Results  Component Value Date   NA 133* 09/15/2014   K 4.5 09/15/2014   CREATININE 1.07 09/15/2014    Assessment: Blood pressure control: mildly elevated Progress toward BP goal:  deteriorated Comments: BP today 156/91 and on repeat 151/93 while on labetalol 200 mg bid. He does have DM2 and baseline creatinine ~ 1.25 so might benefit from ACEi. He says he has only ever been on labetalol for HTN.  Plan: Medications:  Continue labetalol 200 mg bid. Start lisinopril 10 mg daily. Educational resources provided: brochure (denies) Self management tools provided:   Other plans: RTC in 4-6 weeks for recheck and BMP.

## 2014-12-25 NOTE — Progress Notes (Signed)
Case discussed with Dr. Rothman at time of visit. We reviewed the resident's history and exam and pertinent patient test results. I agree with the assessment, diagnosis, and plan of care documented in the resident's note. 

## 2014-12-25 NOTE — Patient Instructions (Addendum)
It was a pleasure to see you today. Please take the fluoxetine 20 mg once a day for your mood and the lisinopril 10 mg once a day for blood pressure. Please return to clinic or seek medical attention if you have any new or worsening thoughts of hurting yourself or others, or other worrisome medical condition. We look forward to seeing you again in 4-6 weeks.  Lottie Mussel, MD  General Instructions:   Please try to bring all your medicines next time. This will help [place] keep you safe from mistakes.   Progress Toward Treatment Goals:  Treatment Goal 12/25/2014  Hemoglobin A1C deteriorated  Blood pressure deteriorated    Self Care Goals & Plans:  Self Care Goal 12/25/2014  Manage my medications take my medicines as prescribed; bring my medications to every visit; refill my medications on time  Monitor my health keep track of my blood glucose; bring my glucose meter and log to each visit  Eat healthy foods drink diet soda or water instead of juice or soda; eat more vegetables; eat foods that are low in salt; eat baked foods instead of fried foods; eat fruit for snacks and desserts  Be physically active -    Home Blood Glucose Monitoring 07/12/2013  Check my blood sugar 3 times a day  When to check my blood sugar before meals     Care Management & Community Referrals:  Referral 12/27/2013  Referrals made for care management support none needed

## 2014-12-25 NOTE — Assessment & Plan Note (Signed)
Last rituximab treatment 07/2013 at [facility]. He says he has missed a few appointments due to depression and not wanting to leave house. He is due to see them in 01/2015 -encouraged to keep appointment

## 2014-12-25 NOTE — Progress Notes (Signed)
   Subjective:    Patient ID: Patrick [name], male    DOB: 1961/09/03, 54 y.o.   MRN: [id number]  HPI  Patrick [name] is a 54 year old man with DM2, HTN, Castleman disease, HIV, CKD here for follow-up on his chronic co-morbidities. Please see problem-based charting assessment and plan note for further details of medical issues addressed at today's visit.  Review of Systems  Constitutional: Negative for fever, chills and diaphoresis.  Respiratory: Negative for cough and shortness of breath.   Cardiovascular: Negative for chest pain.  Gastrointestinal: Negative for nausea, vomiting, abdominal pain, diarrhea and constipation.  Neurological: Negative for weakness, light-headedness, numbness and headaches.  Psychiatric/Behavioral: Positive for decreased concentration. Negative for suicidal ideas. The patient is nervous/anxious.        Objective:   Physical Exam  Constitutional: He is oriented to person, place, and time. He appears well-developed and well-nourished. No distress.  HENT:  Head: Normocephalic and atraumatic.  Mouth/Throat: Oropharynx is clear and moist.  Eyes: EOM are normal. Pupils are equal, round, and reactive to light.  Cardiovascular: Normal rate, regular rhythm, normal heart sounds and intact distal pulses.  Exam reveals no gallop and no friction rub.   No murmur heard. Pulmonary/Chest: Effort normal and breath sounds normal. No respiratory distress. He has no wheezes.  Abdominal: Soft. Bowel sounds are normal. He exhibits no distension. There is no tenderness.  Neurological: He is alert and oriented to person, place, and time.  Skin: He is not diaphoretic.  Vitals reviewed.         Assessment & Plan:

## 2014-12-25 NOTE — Assessment & Plan Note (Signed)
Patrick [name] wanted to talk about his mood today. He describes it as depressed, anxious, and stressed. He says he has problems concentrating. He does not want to leave his house because he does not want to be around people. He says he gets upset when he sees people and they ask him about his medical conditions. He says he feels like he is "falling into a hole" and wants it to stop. He denies suicidal or homicidal ideations. He says he was prescribed paxil in the past but never tried it. He has no SSRI history and does not know of family history. I explained how SSRIs worked and that it can take 4-6 weeks to notice difference and he says he wants to try it. He goes to [facility] which offers fluoxetine for $5 a month so will try this -start fluoxetine 20 mg daily and RTC in 4-6 weeks to reassess and titrate/change as needed -return precautions for SI/HI

## 2014-12-31 ENCOUNTER — Other Ambulatory Visit: Payer: Self-pay | Admitting: Internal Medicine

## 2015-01-01 ENCOUNTER — Other Ambulatory Visit: Payer: Self-pay | Admitting: *Deleted

## 2015-01-01 ENCOUNTER — Other Ambulatory Visit: Payer: Self-pay | Admitting: Internal Medicine

## 2015-01-01 DIAGNOSIS — B079 Viral wart, unspecified: Secondary | ICD-10-CM

## 2015-01-01 NOTE — Telephone Encounter (Signed)
These scripts must have diag code and etc. To be filled

## 2015-01-02 MED ORDER — ACCU-CHEK FASTCLIX LANCETS MISC: Status: AC

## 2015-01-02 MED ORDER — GLUCOSE BLOOD VI STRP: ORAL_STRIP | Status: AC

## 2015-01-04 ENCOUNTER — Other Ambulatory Visit: Payer: Self-pay | Admitting: *Deleted

## 2015-01-05 MED ORDER — METFORMIN HCL 1000 MG PO TABS: 1000.0000 mg | ORAL_TABLET | Freq: Two times a day (BID) | ORAL | Status: AC

## 2015-01-05 MED ORDER — "PEN NEEDLES 3/16"" 31G X 5 MM MISC": 1.0000 | Freq: Every day | Status: AC

## 2015-01-21 ENCOUNTER — Encounter: Payer: Self-pay | Admitting: *Deleted

## 2015-02-08 NOTE — Addendum Note (Signed)
Addended by: Hulan Fray on: 02/08/2015 07:24 PM   Modules accepted: Orders

## 2015-02-25 ENCOUNTER — Other Ambulatory Visit: Payer: Self-pay | Admitting: Internal Medicine

## 2015-05-24 ENCOUNTER — Other Ambulatory Visit: Payer: Self-pay | Admitting: Internal Medicine

## 2015-05-25 NOTE — Telephone Encounter (Signed)
Patient needs to schedule an appointment to be seen in clinic. Will give one month supply.

## 2015-06-21 ENCOUNTER — Encounter: Payer: Self-pay | Admitting: Internal Medicine

## 2015-06-21 ENCOUNTER — Ambulatory Visit: Payer: Medicaid Other | Admitting: Internal Medicine

## 2015-07-17 ENCOUNTER — Other Ambulatory Visit: Payer: Self-pay | Admitting: Internal Medicine

## 2015-07-17 NOTE — Telephone Encounter (Signed)
Patient needs a clinic appointment. Has not followed up since March when fluoxetine was started.

## 2015-08-14 ENCOUNTER — Encounter: Payer: Self-pay | Admitting: Internal Medicine

## 2015-08-14 ENCOUNTER — Ambulatory Visit (INDEPENDENT_AMBULATORY_CARE_PROVIDER_SITE_OTHER): Payer: Medicaid Other | Admitting: Internal Medicine

## 2015-08-14 VITALS — BP 146/77 | HR 68 | Temp 98.2°F | Ht 72.0 in | Wt 231.5 lb

## 2015-08-14 DIAGNOSIS — Z7984 Long term (current) use of oral hypoglycemic drugs: Secondary | ICD-10-CM

## 2015-08-14 DIAGNOSIS — F329 Major depressive disorder, single episode, unspecified: Secondary | ICD-10-CM

## 2015-08-14 DIAGNOSIS — Z21 Asymptomatic human immunodeficiency virus [HIV] infection status: Secondary | ICD-10-CM

## 2015-08-14 DIAGNOSIS — E784 Other hyperlipidemia: Secondary | ICD-10-CM

## 2015-08-14 DIAGNOSIS — Z79899 Other long term (current) drug therapy: Secondary | ICD-10-CM | POA: Diagnosis not present

## 2015-08-14 DIAGNOSIS — I10 Essential (primary) hypertension: Secondary | ICD-10-CM | POA: Diagnosis not present

## 2015-08-14 DIAGNOSIS — Z794 Long term (current) use of insulin: Secondary | ICD-10-CM | POA: Diagnosis not present

## 2015-08-14 DIAGNOSIS — J029 Acute pharyngitis, unspecified: Secondary | ICD-10-CM

## 2015-08-14 DIAGNOSIS — F32A Depression, unspecified: Secondary | ICD-10-CM

## 2015-08-14 DIAGNOSIS — E1169 Type 2 diabetes mellitus with other specified complication: Secondary | ICD-10-CM | POA: Diagnosis not present

## 2015-08-14 DIAGNOSIS — B20 Human immunodeficiency virus [HIV] disease: Secondary | ICD-10-CM

## 2015-08-14 DIAGNOSIS — E785 Hyperlipidemia, unspecified: Secondary | ICD-10-CM

## 2015-08-14 DIAGNOSIS — E139 Other specified diabetes mellitus without complications: Secondary | ICD-10-CM

## 2015-08-14 LAB — POCT GLYCOSYLATED HEMOGLOBIN (HGB A1C): Hemoglobin A1C: 7.4

## 2015-08-14 LAB — GLUCOSE, CAPILLARY: GLUCOSE-CAPILLARY: 191 mg/dL — AB (ref 65–99)

## 2015-08-14 MED ORDER — ROSUVASTATIN CALCIUM 20 MG PO TABS: 20.0000 mg | ORAL_TABLET | Freq: Every day | ORAL | Status: AC

## 2015-08-14 MED ORDER — FLUOXETINE HCL 40 MG PO CAPS: 40.0000 mg | ORAL_CAPSULE | Freq: Every day | ORAL | Status: DC

## 2015-08-14 NOTE — Assessment & Plan Note (Signed)
He reports that his symptoms began >2 months ago. No fevers but does report day and sweats. Soaked completely through 1 shirt several times a week during the day with no strenuous exercise. Reports right sided submandibular/cervical adenopathy that waxes and wanes. Reports that he has had some difficulty initiating swallowing due to the lymphadenopathy as he feels it is pressing on his esophagus. Does notes some pain in his right submandibular area that radiates up his jaw to his ear. Has 15 lb intentional weight loss int he past 8 months. He was recently seen by Dr. Evelene Croon of [facility] hem/onc on 11/7 for this problem. CBC at that time was normal and CRP was not elevated. He was referred to ENT with an appointment scheduled for 12/12. He is anxious about his symptoms getting worse and would like to be seen by ENT sooner.  Oropharynx clear today with no exudate, erythema or ulcerations. Tympanic membranes clear bilaterally.  Will give referral to ENT and try to get him seen earlier. Otherwise, keep 12/12 appointment at [facility].

## 2015-08-14 NOTE — Assessment & Plan Note (Signed)
BP Readings from Last 3 Encounters:  08/14/15 146/77  12/25/14 158/93  09/15/14 161/87    Lab Results  Component Value Date   NA 133* 09/15/2014   K 4.5 09/15/2014   CREATININE 1.07 09/15/2014    Assessment: Blood pressure control:  mildly elevated Progress toward BP goal:   improved Comments: Patient is currently on labetalol 200 mg bid and lisinopril 10 mg daily. He reports that he has not taken his lisinopril today. Denies any cough or angioedema since starting the medications. Does reports that he feels slightly fatigued and can tell a difference when is taking it that he does not like.   Plan: Medications:  continue current medications Educational resources provided:   Self management tools provided:   Other plans: Recent bmp on 11/7 at [facility] was unremarkable. Will continue current medications. Follow up in 3 months for recheck.

## 2015-08-14 NOTE — Assessment & Plan Note (Addendum)
Lab Results  Component Value Date   HGBA1C 7.4 08/14/2015   HGBA1C 7.7 12/25/2014   HGBA1C 7.0 11/22/2013     Assessment: Diabetes control:  fair control Progress toward A1C goal:   improved Comments: Patient is on lantus 15 units daily and metformin 1000 mg bid. Reports not checking his blood sugars at home. Reports compliance with his medications. Denies any episodes of hypoglycemia. A1c improved today at 7.4 from 7.7 on 3/29 visit. Has 15 lb intentional weight loss since last visit.  Plan: Medications:  continue current medications Home glucose monitoring: Frequency:   Timing:   Instruction/counseling given: reminded to get eye exam, reminded to bring blood glucose meter & log to each visit, reminded to bring medications to each visit and discussed the need for weight loss Educational resources provided: brochure (denies) Self management tools provided:   Other plans: Continue current medications. Encouraged continued lifestyle medications. RTC in 3 months for follow up. Reminded him to have his yearly eye exam done.

## 2015-08-14 NOTE — Assessment & Plan Note (Addendum)
Seen on 11/7 by Dr. Hetty Ely at [facility] ID. Was lost to follow up for the past year. Currently on HAART regimen.  CD4 count was 287 and viral load undetectable.  -encouraged to continue follow up with [facility]

## 2015-08-14 NOTE — Assessment & Plan Note (Addendum)
Patrick [name] reports improvement in his mood since starting the fluoxetine. Denies any SI today. Does wish to increase the does today as he still feels somewhat depressed.  Increased dose of fluoxetine to 40 mg daily. RTC in 3 months for follow up.

## 2015-08-14 NOTE — Progress Notes (Signed)
Patient ID: Patrick [name], male   DOB: Oct 06, 1960, 54 y.o.   MRN: [id number]   Subjective:   Patient ID: Patrick [name] male   DOB: Mar 30, 1961 54 y.o.   MRN: [id number]  HPI: Patrick [name] is a 54 y.o. male with a past medical history of HIV and Castleman's disease presents to clinic today with complaints of sore throat and ear pain. He reports that his symptoms began >2 months ago. No fevers but does report day and sweats. Soaked completely through 1 shirt several times a week during the day with no strenuous exercise. Reports right sided submandibular/cervical adenopathy that waxes and wanes. Reports that he has had some difficulty initiating swallowing due to the lymphadenopathy as he feels it is pressing on his esophagus. Does notes some pain in his right submandibular area that radiates up his jaw to his ear. Has 15 lb intentional weight loss int he past 8 months. He was recently seen by Dr. Evelene Croon of [facility] hem/onc on 11/7 for this problem. CBC at that time was normal and CRP was not elevated. He was referred to ENT with an appointment scheduled for 12/12.  For further details of today's visit and the status of his chronic medical issues please refer to the assessment and plan.  Past Medical History  Diagnosis Date  . Hypertension   . HIV (human immunodeficiency virus infection) ([place]) 2010  . Hypogonadism male   . CKD (chronic kidney disease) stage 3, GFR 30-59 ml/min     Baseline Cr = 1.4  . Kidney stones   . Heart murmur   . Anemia   . Hernia   . Pancytopenia with fever ([place]) 02/26/2012  . LYMPHADENOPATHY, DIFFUSE 11/18/2010  . Gallstones    Current Outpatient Prescriptions  Medication Sig Dispense Refill  . abacavir (ZIAGEN) 300 MG tablet Take 2 tablets (600 mg total) by mouth daily. 60 tablet 11  . ACCU-CHEK FASTCLIX LANCETS MISC Use to check blood sugar 1 times daily. 102 each 0  . acetaminophen (TYLENOL) 325 MG tablet Take 2 tablets (650 mg total) by mouth every 6 (six) hours  as needed (or Fever >/= 101). 15 tablet 0  . albuterol (PROVENTIL HFA;VENTOLIN HFA) 108 (90 BASE) MCG/ACT inhaler Inhale 2 puffs into the lungs every 6 (six) hours as needed for wheezing. 8.5 g 5  . amoxicillin-clavulanate (AUGMENTIN) 875-125 MG per tablet Take 1 tablet by mouth 2 (two) times daily. (Patient not taking: Reported on 09/15/2014) 20 tablet 0  . aspirin EC 81 MG tablet Take 81 mg by mouth.    Marland Kitchen atazanavir (REYATAZ) 300 MG capsule Take 1 capsule (300 mg total) by mouth daily with breakfast. 30 capsule 11  . Blood Glucose Monitoring Suppl (ACCU-CHEK NANO SMARTVIEW) W/DEVICE KIT Check blood sugars once a day. 1 kit 0  . cyanocobalamin (,VITAMIN B-12,) 1000 MCG/ML injection Inject 1 mL (1,000 mcg total) into the muscle every 30 (thirty) days. Takes on Monday 1 mL 11  . ferrous sulfate 325 (65 FE) MG tablet Take 325 mg by mouth.    Marland Kitchen FLUoxetine (PROZAC) 40 MG capsule Take 1 capsule (40 mg total) by mouth daily. 30 capsule 5  . glucose blood (ACCU-CHEK SMARTVIEW) test strip Use to check blood sugar 1 times a day 100 each 0  . guaiFENesin (ROBITUSSIN) 100 MG/5ML liquid Take 5-10 mLs (100-200 mg total) by mouth every 4 (four) hours as needed for cough. 60 mL 0  . HYDROcodone-acetaminophen (NORCO/VICODIN) 5-325 MG per tablet Take 1 tablet by mouth  every 6 (six) hours as needed for moderate pain.    Marland Kitchen ibuprofen (ADVIL,MOTRIN) 600 MG tablet Take 600 mg by mouth every 6 (six) hours as needed for moderate pain.     . Insulin Glargine (LANTUS SOLOSTAR) 100 UNIT/ML Solostar Pen Inject 15 Units into the skin daily at 10 pm. 15 mL 0  . Insulin Pen Needle (PEN NEEDLES 3/16") 31G X 5 MM MISC Inject 1 Package into the skin daily. 100 each 3  . labetalol (NORMODYNE) 200 MG tablet TAKE 1 TABLET BY MOUTH TWICE DAILY 180 tablet 0  . lamiVUDine (EPIVIR) 150 MG tablet Take 1 tablet (150 mg total) by mouth daily. 30 tablet 11  . lisinopril (PRINIVIL,ZESTRIL) 10 MG tablet Take 1 tablet (10 mg total) by mouth  daily. 30 tablet 2  . metFORMIN (GLUCOPHAGE) 1000 MG tablet Take 1 tablet (1,000 mg total) by mouth 2 (two) times daily with a meal. 180 tablet 0  . mometasone (NASONEX) 50 MCG/ACT nasal spray Place 2 sprays into the nose daily.    . Multiple Vitamin (MULTI-VITAMINS) TABS Take 1 tablet by mouth daily.    . ritonavir (NORVIR) 100 MG TABS Take 1 tablet (100 mg total) by mouth daily. 30 tablet 11  . rosuvastatin (CRESTOR) 20 MG tablet Take 1 tablet (20 mg total) by mouth at bedtime. 30 tablet 11  . sodium chloride (OCEAN) 0.65 % SOLN nasal spray Place 1 spray into both nostrils as needed for congestion. 60 mL 0  . sodium chloride (OCEAN) 0.65 % SOLN nasal spray Place 1 spray into both nostrils as needed for congestion. 60 mL 0  . Syringe, Disposable, 1 ML MISC Inject 1 Package into the muscle every 30 (thirty) days. For B12 injections 25 each 1  . valACYclovir (VALTREX) 500 MG tablet Take 1 tablet (500 mg total) by mouth 2 (two) times daily. 14 tablet 5   No current facility-administered medications for this visit.   Family History  Problem Relation Age of Onset  . Adopted: Yes   Social History   Social History  . Marital Status: Single    Spouse Name: N/A  . Number of Children: N/A  . Years of Education: N/A   Social History Main Topics  . Smoking status: Never Smoker   . Smokeless tobacco: Never Used  . Alcohol Use: 0.0 oz/week    0 Standard drinks or equivalent per week     Comment: 02/25/12 "couple mixed drinks or beers or glasses of wine/month"  . Drug Use: No  . Sexual Activity: Not Asked   Other Topics Concern  . None   Social History Narrative   Review of Systems: Review of Systems  Constitutional: Negative for fever, chills and malaise/fatigue.  HENT: Positive for ear pain. Negative for congestion, ear discharge, hearing loss, sore throat and tinnitus.   Respiratory: Negative for cough.   Skin: Negative for rash.  Neurological: Negative for headaches.   Objective:    Physical Exam: Filed Vitals:   08/14/15 0903  BP: 146/77  Pulse: 68  Temp: 98.2 F (36.8 C)  TempSrc: Oral  Height: 6' (1.829 m)  Weight: 231 lb 8 oz (105.008 kg)  SpO2: 99%   PHYSICAL EXAM GENERAL- alert, co-operative, appears as stated age, not in any distress. HEENT- Atraumatic, normocephalic, PERRL, EOMI, oral mucosa appears moist, good and intact dentition. No oropharyngeal exudates, erythema or ulcerations. Tympanic membranes clear bilaterally. Bilateral submandibular lymphadenopathy, tender to palpation on the right.  CARDIAC- RRR, no murmurs, rubs or  gallops. RESP- Moving equal volumes of air, and clear to auscultation bilaterally, no wheezes or crackles. ABDOMEN- Soft, nontender, no guarding or rebound, no palpable masses or organomegaly, bowel sounds present. BACK- Normal curvature of the spine, No tenderness along the vertebrae, no CVA tenderness. NEURO- No obvious Cr N abnormality, strenght upper and lower extremities- 5/5, Sensation intact- globally, DTRs- Normal, finger to nose test normal bilat, rapid alternating movement- intact, Gait- Normal. EXTREMITIES- pulse 2+, symmetric, no pedal edema. SKIN- Warm, dry, No rash or lesion. PSYCH- Normal mood and affect, appropriate thought content and speech.  Assessment & Plan:   Case discussed with Dr. Dareen Piano. Please refer to Problem based carting for further details of today's visit.

## 2015-08-14 NOTE — Assessment & Plan Note (Signed)
ASCVD 10 year risk 30.7%. Will start Crestor 20 mg daily today. RTC in 6-8 weeks for recheck.

## 2015-08-14 NOTE — Patient Instructions (Addendum)
Thank you for coming in today  -We will call ENT and try to get you seen sooner.  -I have increased your Prozac to 40 mg daily -I have also prescribed you a new medication, Crestor for you cholesterol.  -Please return to clinic in 6-8 weeks for follow up

## 2015-08-15 NOTE — Progress Notes (Signed)
Internal Medicine Clinic Attending  I saw and evaluated the patient.  I personally confirmed the key portions of the history and exam documented by Dr. Boswell and I reviewed pertinent patient test results.  The assessment, diagnosis, and plan were formulated together and I agree with the documentation in the resident's note. 

## 2015-09-26 ENCOUNTER — Emergency Department (HOSPITAL_COMMUNITY)
Admission: EM | Admit: 2015-09-26 | Discharge: 2015-09-26 | Disposition: A | Discharge status: Home / Self Care | Payer: Medicaid Other | Source: Home / Self Care

## 2015-09-26 ENCOUNTER — Encounter (HOSPITAL_COMMUNITY): Payer: Self-pay | Admitting: Emergency Medicine

## 2015-09-26 DIAGNOSIS — J111 Influenza due to unidentified influenza virus with other respiratory manifestations: Secondary | ICD-10-CM | POA: Diagnosis not present

## 2015-09-26 MED ORDER — OSELTAMIVIR PHOSPHATE 75 MG PO CAPS: 75.0000 mg | ORAL_CAPSULE | Freq: Two times a day (BID) | ORAL | Status: AC

## 2015-09-26 NOTE — Discharge Instructions (Signed)

## 2015-09-26 NOTE — ED Notes (Signed)
Fever, chills, uri symptoms started yesterday

## 2015-09-26 NOTE — ED Provider Notes (Signed)
CSN: [id number]     Arrival date & time 09/26/15  1304 History   None    No chief complaint on file.  (Consider location/radiation/quality/duration/timing/severity/associated sxs/prior Treatment) HPI History obtained from patient:   LOCATION:body SEVERITY:4 DURATION:since yesterday CONTEXT:sudden onset body aches, fever, sore throat QUALITY: MODIFYING FACTORS:OTC meds without any relief, flu shot several weeks ago. ASSOCIATED SYMPTOMS:fever, sore throat TIMING:constant Dis not take antihypertensive meds today.  Past Medical History  Diagnosis Date  . Hypertension   . HIV (human immunodeficiency virus infection) ([place]) 2010  . Hypogonadism male   . CKD (chronic kidney disease) stage 3, GFR 30-59 ml/min     Baseline Cr = 1.4  . Kidney stones   . Heart murmur   . Anemia   . Hernia   . Pancytopenia with fever ([place]) 02/26/2012  . LYMPHADENOPATHY, DIFFUSE 11/18/2010  . Gallstones    Past Surgical History  Procedure Laterality Date  . Lithotripsy  2010  . Appendectomy  2001    via midline incision  . Axillary lymph node dissection  2011; 2012    left   Family History  Problem Relation Age of Onset  . Adopted: Yes   Social History  Substance Use Topics  . Smoking status: Never Smoker   . Smokeless tobacco: Never Used  . Alcohol Use: 0.0 oz/week    0 Standard drinks or equivalent per week     Comment: 02/25/12 "couple mixed drinks or beers or glasses of wine/month"    Review of Systems ROS +'ve body aches, fever, sore throat  Denies: HEADACHE, NAUSEA, ABDOMINAL PAIN, CHEST PAIN, CONGESTION, DYSURIA, SHORTNESS OF BREATH  Allergies  Codeine; Sulfonamide derivatives; and Dapsone  Home Medications   Prior to Admission medications   Medication Sig Start Date End Date Taking? Authorizing Provider  abacavir (ZIAGEN) 300 MG tablet Take 2 tablets (600 mg total) by mouth daily. 07/12/12   Michel Bickers, MD  ACCU-CHEK FASTCLIX LANCETS MISC Use to check blood sugar 1 times  daily. 01/02/15   Kelby Aline, MD  acetaminophen (TYLENOL) 325 MG tablet Take 2 tablets (650 mg total) by mouth every 6 (six) hours as needed (or Fever >/= 101). 06/23/12   Jessee Avers, MD  albuterol (PROVENTIL HFA;VENTOLIN HFA) 108 (90 BASE) MCG/ACT inhaler Inhale 2 puffs into the lungs every 6 (six) hours as needed for wheezing. 10/10/14   Kelby Aline, MD  amoxicillin-clavulanate (AUGMENTIN) 875-125 MG per tablet Take 1 tablet by mouth 2 (two) times daily. Patient not taking: Reported on 09/15/2014 03/01/14   Michel Bickers, MD  aspirin EC 81 MG tablet Take 81 mg by mouth. 12/05/12   Historical Provider, MD  atazanavir (REYATAZ) 300 MG capsule Take 1 capsule (300 mg total) by mouth daily with breakfast. 07/12/12   Michel Bickers, MD  Blood Glucose Monitoring Suppl (ACCU-CHEK NANO SMARTVIEW) W/DEVICE KIT Check blood sugars once a day. 01/09/14   Malena Catholic, MD  cyanocobalamin (,VITAMIN B-12,) 1000 MCG/ML injection Inject 1 mL (1,000 mcg total) into the muscle every 30 (thirty) days. Takes on Monday 07/12/14   Kelby Aline, MD  ferrous sulfate 325 (65 FE) MG tablet Take 325 mg by mouth. 12/05/12   Historical Provider, MD  FLUoxetine (PROZAC) 40 MG capsule Take 1 capsule (40 mg total) by mouth daily. 08/14/15   Maryellen Pile, MD  glucose blood (ACCU-CHEK SMARTVIEW) test strip Use to check blood sugar 1 times a day 01/02/15   Kelby Aline, MD  guaiFENesin (ROBITUSSIN) 100 MG/5ML liquid  Take 5-10 mLs (100-200 mg total) by mouth every 4 (four) hours as needed for cough. 09/15/14   Jennifer Piepenbrink, PA-C  HYDROcodone-acetaminophen (NORCO/VICODIN) 5-325 MG per tablet Take 1 tablet by mouth every 6 (six) hours as needed for moderate pain.    Historical Provider, MD  ibuprofen (ADVIL,MOTRIN) 600 MG tablet Take 600 mg by mouth every 6 (six) hours as needed for moderate pain.     Historical Provider, MD  Insulin Glargine (LANTUS SOLOSTAR) 100 UNIT/ML Solostar Pen Inject 15 Units into the skin  daily at 10 pm. 12/31/14   Kelby Aline, MD  Insulin Pen Needle (PEN NEEDLES 3/16") 31G X 5 MM MISC Inject 1 Package into the skin daily. 01/05/15   Kelby Aline, MD  labetalol (NORMODYNE) 200 MG tablet TAKE 1 TABLET BY MOUTH TWICE DAILY 02/27/15   Kelby Aline, MD  lamiVUDine (EPIVIR) 150 MG tablet Take 1 tablet (150 mg total) by mouth daily. 07/12/12   Michel Bickers, MD  lisinopril (PRINIVIL,ZESTRIL) 10 MG tablet Take 1 tablet (10 mg total) by mouth daily. 12/25/14 12/25/15  Kelby Aline, MD  metFORMIN (GLUCOPHAGE) 1000 MG tablet Take 1 tablet (1,000 mg total) by mouth 2 (two) times daily with a meal. 01/05/15   Kelby Aline, MD  mometasone (NASONEX) 50 MCG/ACT nasal spray Place 2 sprays into the nose daily.    Historical Provider, MD  Multiple Vitamin (MULTI-VITAMINS) TABS Take 1 tablet by mouth daily. 12/05/12   Historical Provider, MD  ritonavir (NORVIR) 100 MG TABS Take 1 tablet (100 mg total) by mouth daily. 07/12/12   Michel Bickers, MD  rosuvastatin (CRESTOR) 20 MG tablet Take 1 tablet (20 mg total) by mouth at bedtime. 08/14/15 08/13/16  Maryellen Pile, MD  sodium chloride (OCEAN) 0.65 % SOLN nasal spray Place 1 spray into both nostrils as needed for congestion. 11/22/13   Jessee Avers, MD  sodium chloride (OCEAN) 0.65 % SOLN nasal spray Place 1 spray into both nostrils as needed for congestion. 09/15/14   Jennifer Piepenbrink, PA-C  Syringe, Disposable, 1 ML MISC Inject 1 Package into the muscle every 30 (thirty) days. For B12 injections 07/12/13   Hester Mates, MD  valACYclovir (VALTREX) 500 MG tablet Take 1 tablet (500 mg total) by mouth 2 (two) times daily. 10/25/12   Michel Bickers, MD   Meds Ordered and Administered this Visit  Medications - No data to display  BP 180/101 mmHg  Pulse 79  Temp(Src) 99.3 F (37.4 C) (Oral)  Resp 20  SpO2 95% No data found.   Physical Exam  Constitutional: He appears well-developed and well-nourished. No distress.  HENT:  Head: Normocephalic  and atraumatic.  Right Ear: External ear normal.  Left Ear: External ear normal.  Mouth/Throat: Oropharynx is clear and moist. No oropharyngeal exudate.  Eyes: Conjunctivae are normal.  Neck: Neck supple.  Cardiovascular: Normal rate and normal heart sounds.   Pulmonary/Chest: Effort normal and breath sounds normal.  Abdominal: Soft. Bowel sounds are normal.  Skin: Skin is warm and dry.  Nursing note and vitals reviewed.   ED Course  Procedures (including critical care time)  Labs Review Labs Reviewed - No data to display  Imaging Review No results found.   Visual Acuity Review  Right Eye Distance:   Left Eye Distance:   Bilateral Distance:    Right Eye Near:   Left Eye Near:    Bilateral Near:         MDM   1. Flu  syndrome    Diagnosis based onH&P. No influenza test Discussed treatment options and patient states that he would prefer Tamiflu. He states that he understands the limits of the medication.  Patient is advised to continue home symptomatic treatment. Prescription for Tamiflu is sent pharmacy patient has indicated. Patient is advised that if there are new or worsening symptoms or attend the emergency department, or contact primary care provider. Instructions of care provided discharged home in stable condition.  THIS NOTE WAS GENERATED USING A VOICE RECOGNITION SOFTWARE PROGRAM. ALL REASONABLE EFFORTS  WERE MADE TO PROOFREAD THIS DOCUMENT FOR ACCURACY.    Konrad Felix, [place] 09/26/15 [phone]

## 2015-11-19 ENCOUNTER — Other Ambulatory Visit: Payer: Self-pay | Admitting: *Deleted

## 2015-11-20 MED ORDER — ALBUTEROL SULFATE HFA 108 (90 BASE) MCG/ACT IN AERS
2.0000 | INHALATION_SPRAY | Freq: Four times a day (QID) | RESPIRATORY_TRACT | Status: AC | PRN
Start: 2015-11-20 — End: ?

## 2016-01-06 ENCOUNTER — Other Ambulatory Visit: Payer: Self-pay | Admitting: *Deleted

## 2016-01-06 MED ORDER — LABETALOL HCL 200 MG PO TABS: 200.0000 mg | ORAL_TABLET | Freq: Two times a day (BID) | ORAL | Status: AC

## 2016-01-30 ENCOUNTER — Ambulatory Visit (INDEPENDENT_AMBULATORY_CARE_PROVIDER_SITE_OTHER): Payer: Medicaid Other | Admitting: Internal Medicine

## 2016-01-30 ENCOUNTER — Encounter: Payer: Self-pay | Admitting: Internal Medicine

## 2016-01-30 VITALS — BP 142/84 | HR 78 | Temp 99.2°F | Wt 247.9 lb

## 2016-01-30 DIAGNOSIS — J019 Acute sinusitis, unspecified: Secondary | ICD-10-CM | POA: Insufficient documentation

## 2016-01-30 DIAGNOSIS — J018 Other acute sinusitis: Secondary | ICD-10-CM

## 2016-01-30 DIAGNOSIS — B9689 Other specified bacterial agents as the cause of diseases classified elsewhere: Secondary | ICD-10-CM | POA: Diagnosis not present

## 2016-01-30 DIAGNOSIS — J329 Chronic sinusitis, unspecified: Secondary | ICD-10-CM

## 2016-01-30 MED ORDER — PHENYLEPHRINE HCL 10 MG PO TABS: 10.0000 mg | ORAL_TABLET | ORAL | Status: AC | PRN

## 2016-01-30 MED ORDER — AMOXICILLIN-POT CLAVULANATE 875-125 MG PO TABS: 1.0000 | ORAL_TABLET | Freq: Two times a day (BID) | ORAL | Status: AC

## 2016-01-30 NOTE — Patient Instructions (Signed)
For the next 7 days, please take Augmentin twice daily and phenylephrine 10mg  every 6 hours.   Please see Dr. Charlynn Grimes back in 1 month.

## 2016-01-30 NOTE — Progress Notes (Signed)
   Subjective:    Patient ID: Patrick [name], male    DOB: 01/20/1961, 55 y.o.   MRN: [id number]  HPI Patrick [name] is a 55 year old male with HIV, Castleman disease, Insulin-dependent type 2 diabetes who presents today for congestion. Please see assessment & plan for status of chronic medical problems.  At his subsequent visit with PCP, he would likely to reconcile his many medications.  Review of Systems  Constitutional: Positive for activity change. Negative for fever.  HENT: Positive for congestion, rhinorrhea, sinus pressure, sneezing and sore throat.   Respiratory: Positive for cough.   Neurological: Positive for headaches.       Objective:   Physical Exam  Constitutional: He is oriented to person, place, and time. He appears well-developed and well-nourished. No distress.  HENT:  Head: Normocephalic and atraumatic.  Frontal and maxillary sinuses nontender to palpation. Oropharynx with moist mucous membranes. Posterior oropharynx without erythema.  Eyes: Conjunctivae are normal. No scleral icterus.  Neck: No tracheal deviation present.  Cardiovascular: Normal rate and regular rhythm.   Pulmonary/Chest: Effort normal. No stridor. No respiratory distress.  Lymphadenopathy:    He has cervical adenopathy (Stable per self-report).  Neurological: He is alert and oriented to person, place, and time.  Skin: Skin is warm and dry. He is not diaphoretic.          Assessment & Plan:

## 2016-01-30 NOTE — Assessment & Plan Note (Addendum)
Overview For the past month, he reports waxing and waning of his congestion. The first week was not as bad though he was bedridden the second week with congestion in his chest. The third week, he wondered if he had migraines as of how bad his headaches were. Associated symptoms include cough productive of green sputum, rhinorrhea, otorrhea which are not accompanied by itching, sore throat, changes in his appetite, nausea, vomiting, changes in bowel movements. Medications he has tried include Mucinex "high intensity" every 8 hours along with daily Zyrtec and Flonase. He was seen by ENT in December 2016 at [facility], and per review of their office note, he did not have any concerning findings on their assessment.   Assessment Subacute bacterial sinusitis. Certainly, his constellation of symptoms could be accounted for by allergic rhinitis though he denies any pruritus or postnasal drip [sore throat worse in the morning]. He doesn't knowledge that these appear to be recurrent in nature.  Plan -Prescribed Augmentin 875/125 mg twice daily 14 tablets for a seven-day course -Prescribed phenylephrine 10 mg every 6 hours as needed for seven-day course -Should his symptoms persist or recur, I encouraged him to see an allergist to undergo formal testing since it does appear that his symptoms may have a seasonal component to them to which he is agreeable. He has also never tried mometasone nasal spray which is stronger than fluticasone nasal spray as well.

## 2016-01-31 ENCOUNTER — Telehealth: Payer: Self-pay | Admitting: Internal Medicine

## 2016-01-31 NOTE — Telephone Encounter (Signed)
APT. REMINDER CALL, NO ANSWER °

## 2016-02-03 ENCOUNTER — Ambulatory Visit: Payer: Medicaid Other | Admitting: Internal Medicine

## 2016-02-05 NOTE — Progress Notes (Signed)
Case discussed with Dr. Patel at the time of the visit.  We reviewed the resident's history and exam and pertinent patient test results.  I agree with the assessment, diagnosis and plan of care documented in the resident's note. 

## 2016-03-13 ENCOUNTER — Ambulatory Visit (INDEPENDENT_AMBULATORY_CARE_PROVIDER_SITE_OTHER): Payer: Medicaid Other | Admitting: Internal Medicine

## 2016-03-13 ENCOUNTER — Encounter: Payer: Self-pay | Admitting: Internal Medicine

## 2016-03-13 VITALS — BP 161/74 | HR 90 | Temp 97.9°F | Ht 72.0 in | Wt 244.5 lb

## 2016-03-13 DIAGNOSIS — M25569 Pain in unspecified knee: Secondary | ICD-10-CM | POA: Insufficient documentation

## 2016-03-13 DIAGNOSIS — M25462 Effusion, left knee: Secondary | ICD-10-CM | POA: Diagnosis not present

## 2016-03-13 DIAGNOSIS — M25562 Pain in left knee: Secondary | ICD-10-CM

## 2016-03-13 NOTE — Assessment & Plan Note (Signed)
A/P: Likely meniscal tear on a background of OA vs ligamental injury. No trauma with the fall.  Patient able to ambulate and pain controlled with OTC medications. Given his CKD, we have instructed him to avoid NSAIDs and to use Tylenol.  Will defer Xrays at this time as they will not show meniscal or ligamentous injury and place referrals to Sports Medicine and Orthopedics for follow up. - Sports Med referral - Ortho referral

## 2016-03-13 NOTE — Patient Instructions (Signed)
Continue Tylenol 1000 mg two to three times daily. Avoid Advil and Aleve, as they can hurt your kidneys. Continue to rest, ice, compress, and elevate the knee.  Meniscus Tear A meniscus tear is a knee injury in which a piece of the meniscus is torn. The meniscus is a thick, rubbery, wedge-shaped cartilage in the knee. Two menisci are located in each knee. They sit between the upper bone (femur) and lower bone (tibia) that make up the knee joint. Each meniscus acts as a shock absorber for the knee. A torn meniscus is one of the most common types of knee injuries. This injury can range from mild to severe. Surgery may be needed for a severe tear. CAUSES This injury may be caused by any squatting, twisting, or pivoting movement. Sports-related injuries are the most common cause. These often occur from:  Running and stopping suddenly.  Changing direction.  Being tackled or knocked off your feet. As people get older, their meniscus gets thinner and weaker. In these people, tears can happen more easily, such as from climbing stairs.  RISK FACTORS This injury is more likely to happen to:  People who play contact sports.  Males.  People who are 11-44 years of age. SYMPTOMS  Symptoms of this injury include:  Knee pain, especially at the side of the knee joint. You may feel pain when the injury occurs, or you may only hear a pop and feel pain later.  A feeling that your knee is clicking, catching, locking, or giving way.  Not being able to fully bend or extend your knee.  Bruising or swelling in your knee. DIAGNOSIS  This injury may be diagnosed based on your symptoms and a physical exam. The physical exam may include:  Moving your knee in different ways.  Feeling for tenderness.  Listening for a clicking sound.  Checking if your knee locks or catches. You may also have tests, such as:  X-rays.  MRI.  A procedure to look inside your knee with a narrow surgical telescope  (arthroscopy). You may be referred to a knee specialist (orthopedic surgeon). TREATMENT  Treatment for this injury depends on the severity of the tear. Treatment for a mild tear may include:  Rest.  Medicine to reduce pain and swelling. This is usually a nonsteroidal anti-inflammatory drug (NSAID).  A knee brace or an elastic sleeve or wrap.  Using crutches or a walker to keep weight off your knee and to help you walk.  Exercises to strengthen your knee (physical therapy). You may need surgery if you have a severe tear or if other treatments are not working.  HOME CARE INSTRUCTIONS Managing Pain and Swelling  Take over-the-counter and prescription medicines only as told by your health care provider.  If directed, apply ice to the injured area:  Put ice in a plastic bag.  Place a towel between your skin and the bag.  Leave the ice on for 20 minutes, 2-3 times per day.  Raise (elevate) the injured area above the level of your heart while you are sitting or lying down. Activity  Do not use the injured limb to support your body weight until your health care provider says that you can. Use crutches or a walker as told by your health care provider.  Return to your normal activities as told by your health care provider. Ask your health care provider what activities are safe for you.  Perform range-of-motion exercises only as told by your health care provider.  Begin  doing exercises to strengthen your knee and leg muscles only as told by your health care provider. After you recover, your health care provider may recommend these exercises to help prevent another injury. General Instructions  Use a knee brace or elastic wrap as told by your health care provider.  Keep all follow-up visits as told by your health care provider. This is important. SEEK MEDICAL CARE IF:  You have a fever.  Your knee becomes red, tender, or swollen.  Your pain medicine is not helping.  Your  symptoms get worse or do not improve after 2 weeks of home care.   This information is not intended to replace advice given to you by your health care provider. Make sure you discuss any questions you have with your health care provider.   Document Released: 12/05/2002 Document Revised: 06/05/2015 Document Reviewed: 01/07/2015 Elsevier Interactive Patient Education [employer].

## 2016-03-13 NOTE — Progress Notes (Signed)
Patient ID: Patrick [name], male   DOB: December 26, 1960, 55 y.o.   MRN: [id number]   Subjective:   Patient ID: Patrick [name] male   DOB: 01/29/1961 55 y.o.   MRN: [id number]  HPI: Patrick [name] is a 55 y.o. male with PMH as below, here for eval left knee pain.  Please see Problem-Based charting for the status of the patient's chronic medical issues.  Patient reports 1.5 weeks of left knee aching soreness, popping, and cracking, which started after a day of constantly walking up and down 3 flights of stairs.  A couple days later, he slipped on the stairs and his left knee torqued sideways.  He has been using ice, ACE bandages, rest, and elevation, as well as Aleve 440 mg BID-TID and/or Tylenol 1000 mg TID for pain relief.  He denies fevers or chills or joint pains different from usually OA.  He has been able to walk on the affected knee.  The pain is better in the morning and worsens over the course of the day.   Past Medical History  Diagnosis Date  . Hypertension   . HIV (human immunodeficiency virus infection) ([place]) 2010  . Hypogonadism male   . CKD (chronic kidney disease) stage 3, GFR 30-59 ml/min     Baseline Cr = 1.4  . Kidney stones   . Heart murmur   . Anemia   . Hernia   . Pancytopenia with fever ([place]) 02/26/2012  . LYMPHADENOPATHY, DIFFUSE 11/18/2010  . Gallstones    Current Outpatient Prescriptions  Medication Sig Dispense Refill  . abacavir (ZIAGEN) 300 MG tablet Take 2 tablets (600 mg total) by mouth daily. 60 tablet 11  . ACCU-CHEK FASTCLIX LANCETS MISC Use to check blood sugar 1 times daily. 102 each 0  . acetaminophen (TYLENOL) 325 MG tablet Take 2 tablets (650 mg total) by mouth every 6 (six) hours as needed (or Fever >/= 101). 15 tablet 0  . albuterol (PROVENTIL HFA;VENTOLIN HFA) 108 (90 Base) MCG/ACT inhaler Inhale 2 puffs into the lungs every 6 (six) hours as needed for wheezing. 18 g 3  . amoxicillin-clavulanate (AUGMENTIN) 875-125 MG tablet Take 1 tablet by mouth 2  (two) times daily. 14 tablet 0  . aspirin EC 81 MG tablet Take 81 mg by mouth.    Marland Kitchen atazanavir (REYATAZ) 300 MG capsule Take 1 capsule (300 mg total) by mouth daily with breakfast. 30 capsule 11  . Blood Glucose Monitoring Suppl (ACCU-CHEK NANO SMARTVIEW) W/DEVICE KIT Check blood sugars once a day. 1 kit 0  . cyanocobalamin (,VITAMIN B-12,) 1000 MCG/ML injection Inject 1 mL (1,000 mcg total) into the muscle every 30 (thirty) days. Takes on Monday 1 mL 11  . ferrous sulfate 325 (65 FE) MG tablet Take 325 mg by mouth.    Marland Kitchen FLUoxetine (PROZAC) 40 MG capsule Take 1 capsule (40 mg total) by mouth daily. 30 capsule 5  . glucose blood (ACCU-CHEK SMARTVIEW) test strip Use to check blood sugar 1 times a day 100 each 0  . guaiFENesin (ROBITUSSIN) 100 MG/5ML liquid Take 5-10 mLs (100-200 mg total) by mouth every 4 (four) hours as needed for cough. 60 mL 0  . HYDROcodone-acetaminophen (NORCO/VICODIN) 5-325 MG per tablet Take 1 tablet by mouth every 6 (six) hours as needed for moderate pain.    Marland Kitchen ibuprofen (ADVIL,MOTRIN) 600 MG tablet Take 600 mg by mouth every 6 (six) hours as needed for moderate pain.     . Insulin Glargine (LANTUS SOLOSTAR) 100 UNIT/ML Solostar Pen  Inject 15 Units into the skin daily at 10 pm. 15 mL 0  . Insulin Pen Needle (PEN NEEDLES 3/16") 31G X 5 MM MISC Inject 1 Package into the skin daily. 100 each 3  . labetalol (NORMODYNE) 200 MG tablet Take 1 tablet (200 mg total) by mouth 2 (two) times daily. 180 tablet 2  . lamiVUDine (EPIVIR) 150 MG tablet Take 1 tablet (150 mg total) by mouth daily. 30 tablet 11  . lisinopril (PRINIVIL,ZESTRIL) 10 MG tablet Take 1 tablet (10 mg total) by mouth daily. 30 tablet 2  . metFORMIN (GLUCOPHAGE) 1000 MG tablet Take 1 tablet (1,000 mg total) by mouth 2 (two) times daily with a meal. 180 tablet 0  . mometasone (NASONEX) 50 MCG/ACT nasal spray Place 2 sprays into the nose daily.    . Multiple Vitamin (MULTI-VITAMINS) TABS Take 1 tablet by mouth daily.      Marland Kitchen oseltamivir (TAMIFLU) 75 MG capsule Take 1 capsule (75 mg total) by mouth every 12 (twelve) hours. 10 capsule 0  . phenylephrine (SUDAFED PE) 10 MG TABS tablet Take 1 tablet (10 mg total) by mouth every 4 (four) hours as needed. 42 tablet 0  . ritonavir (NORVIR) 100 MG TABS Take 1 tablet (100 mg total) by mouth daily. 30 tablet 11  . rosuvastatin (CRESTOR) 20 MG tablet Take 1 tablet (20 mg total) by mouth at bedtime. 30 tablet 11  . sodium chloride (OCEAN) 0.65 % SOLN nasal spray Place 1 spray into both nostrils as needed for congestion. 60 mL 0  . sodium chloride (OCEAN) 0.65 % SOLN nasal spray Place 1 spray into both nostrils as needed for congestion. 60 mL 0  . Syringe, Disposable, 1 ML MISC Inject 1 Package into the muscle every 30 (thirty) days. For B12 injections 25 each 1  . valACYclovir (VALTREX) 500 MG tablet Take 1 tablet (500 mg total) by mouth 2 (two) times daily. 14 tablet 5   No current facility-administered medications for this visit.   Family History  Problem Relation Age of Onset  . Adopted: Yes   Social History   Social History  . Marital Status: Single    Spouse Name: N/A  . Number of Children: N/A  . Years of Education: N/A   Social History Main Topics  . Smoking status: Never Smoker   . Smokeless tobacco: Never Used  . Alcohol Use: 0.0 oz/week    0 Standard drinks or equivalent per week     Comment: 02/25/12 "couple mixed drinks or beers or glasses of wine/month"  . Drug Use: No  . Sexual Activity: Not on file   Other Topics Concern  . Not on file   Social History Narrative   Review of Systems: Pertinent items are noted in HPI. Objective:  Physical Exam: Filed Vitals:   03/13/16 1437 03/13/16 1438  BP:  161/74  Pulse:  90  Temp: 97.9 F (36.6 C)   TempSrc: Oral   Height: 6' (1.829 m)   Weight: 244 lb 8 oz (110.904 kg)   SpO2:  96%   Physical Exam  Constitutional: He is oriented to person, place, and time and well-developed, well-nourished,  and in no distress. No distress.  HENT:  Head: Normocephalic and atraumatic.  Eyes: EOM are normal. No scleral icterus.  Neck: No tracheal deviation present.  Pulmonary/Chest: No stridor.  Musculoskeletal:  Small left knee effusion.  Left knee pain on palpation of inferior pole of patella and medial knee joint line.  Pain reproduced with  eversion of LE with knee flexed at 90 degrees.  Neurological: He is alert and oriented to person, place, and time.  Skin: He is not diaphoretic.     Assessment & Plan:   Patient and case were discussed with Dr. Lynnae January.  Please refer to Problem Based charting for further documentation.

## 2016-03-16 ENCOUNTER — Encounter: Payer: Self-pay | Admitting: *Deleted

## 2016-03-16 NOTE — Progress Notes (Signed)
[facility] Attending  Case discussed with Dr. Taylor at the time of the visit.  We reviewed the resident's history and exam and pertinent patient test results.  I agree with the assessment, diagnosis, and plan of care documented in the resident's note. 

## 2016-04-07 ENCOUNTER — Ambulatory Visit (INDEPENDENT_AMBULATORY_CARE_PROVIDER_SITE_OTHER): Payer: Medicaid Other | Admitting: Family Medicine

## 2016-04-07 ENCOUNTER — Encounter: Payer: Self-pay | Admitting: Family Medicine

## 2016-04-07 VITALS — BP 161/101 | Ht 72.0 in | Wt 235.0 lb

## 2016-04-07 DIAGNOSIS — B20 Human immunodeficiency virus [HIV] disease: Secondary | ICD-10-CM | POA: Diagnosis not present

## 2016-04-07 DIAGNOSIS — F329 Major depressive disorder, single episode, unspecified: Secondary | ICD-10-CM | POA: Diagnosis present

## 2016-04-07 DIAGNOSIS — R599 Enlarged lymph nodes, unspecified: Secondary | ICD-10-CM | POA: Diagnosis not present

## 2016-04-07 DIAGNOSIS — E139 Other specified diabetes mellitus without complications: Secondary | ICD-10-CM | POA: Diagnosis not present

## 2016-04-07 DIAGNOSIS — M25562 Pain in left knee: Secondary | ICD-10-CM | POA: Diagnosis not present

## 2016-04-07 DIAGNOSIS — I10 Essential (primary) hypertension: Secondary | ICD-10-CM | POA: Diagnosis not present

## 2016-04-07 NOTE — Patient Instructions (Signed)
Thank you for coming in,   Continue to ice and wear a brace.   You can perform the exercises as long as there is no pain.   Please follow up with [place] in three weeks if you continue to have pain.    Please feel free to call with any questions or concerns at any time. --Dr. Raeford Razor

## 2016-04-07 NOTE — Progress Notes (Signed)
  Patrick [name] - 55 y.o. male MRN [id number]  Date of birth: 01/27/1961  SUBJECTIVE:  Including CC & ROS.  Chief Complaint  Patient presents with  . Knee Pain   He was working on a project and had to walk up and down several flights of stairs.  He slipped walking down the stairs and his foot slipped and he heard a pop in his knee.  Has been wearing a knee brace since that time.  He has placed ice packs on his knee with some improvement.  Has been sitting in a hot tub as well.  Pain is occurring in the Left knee in the medial aspect   Pain started 1 month ago Described as sharp to dull ache.   Ranges from 10/10 on the day it happened to moderate now.  Intermittent in nature but wants to make sure that nothing is wrong with his knee.   Pain is worse at the end of the day and beginning of day and loosens up as he walks around.  No locking up but has giving way.  Having improvement with the brace.  Tylenol with some improvement  No prior injury or surgery  Reports some numbness in his feet from diabetic neuropathy.   ROS: No unexpected weight loss, feer, chills, swelling, instability, muscle pain, redness, otherwise see HPI   HISTORY: Past Medical, Surgical, Social, and Family History Reviewed & Updated per EMR.   Pertinent Historical Findings include: PMSHx -  DM2 (Last A1c 7.4), Appendectomy, PSHx -  No tobacco or alcohol use. Works at an [employer] -  Patient is adopted and unkwnon family history  Medications - metformin, crestor, lantus   DATA REVIEWED: No prior imaging.  PHYSICAL EXAM:  VS: BP:(!) 161/101 mmHg  HR: bpm  TEMP: ( )  RESP:   HT:6' (182.9 cm)   WT:235 lb (106.595 kg)  BMI:31.9 PHYSICAL EXAM: Gen: NAD, alert, cooperative with exam, well-appearing HEENT: clear conjunctiva,  CV:  no edema, capillary refill brisk, normal rate Resp: non-labored Skin: no rashes, normal turgor  Neuro: no gross deficits.  Psych:  alert and oriented Knee: Normal to  inspection with no erythema or effusion or obvious bony abnormalities. Palpation with no warmth, lateral joint line tenderness, patellar tenderness Some mild medial joint line tenderness ROM full in flexion and extension and lower leg rotation. Ligaments with solid consistent endpoints including ACL, PCL, LCL, MCL. Negative Mcmurray's and Thessalonian tests. Some pain with patellar compression. Patellar and quadriceps tendons unremarkable. Hamstring and quadriceps strength is normal. Neurovascularly intact    Limited US: Left knee: no fluid observed in the suprapatellar pouch. Quad and patellar tendon intact in the long axis. Medical and lateral meniscus without obvious tear.     ASSESSMENT & PLAN:   Knee pain, acute Most likely has a flare of some underlying arthritis vs meniscal injury.  No obvious meniscal tear on the lateral aspect and no clinical findings to demonstrate a significant tear.  No suggestion of ACL and collateral ligament tear.  - advised to continue the brace, ice, elevation and tylenol  - provided some home exercises  - will follow up in three weeks. If no improvement can consider injections and/or PT.

## 2016-04-07 NOTE — Assessment & Plan Note (Signed)
Most likely has a flare of some underlying arthritis vs meniscal injury.  No obvious meniscal tear on the lateral aspect and no clinical findings to demonstrate a significant tear.  No suggestion of ACL and collateral ligament tear.  - advised to continue the brace, ice, elevation and tylenol  - provided some home exercises  - will follow up in three weeks. If no improvement can consider injections and/or PT.

## 2016-04-22 ENCOUNTER — Encounter: Payer: Medicaid Other | Admitting: Internal Medicine

## 2016-04-22 ENCOUNTER — Encounter: Payer: Self-pay | Admitting: Internal Medicine

## 2016-04-28 ENCOUNTER — Ambulatory Visit: Payer: Medicaid Other | Admitting: Family Medicine

## 2016-07-26 ENCOUNTER — Other Ambulatory Visit: Payer: Self-pay | Admitting: Internal Medicine

## 2016-07-26 DIAGNOSIS — F32A Depression, unspecified: Secondary | ICD-10-CM

## 2016-07-26 DIAGNOSIS — F329 Major depressive disorder, single episode, unspecified: Secondary | ICD-10-CM

## 2017-11-10 ENCOUNTER — Encounter: Payer: Self-pay | Admitting: Internal Medicine

## 2018-03-27 ENCOUNTER — Encounter: Payer: Self-pay | Admitting: *Deleted

## 2019-12-28 ENCOUNTER — Ambulatory Visit: Payer: Medicare Other | Attending: Internal Medicine
